# Patient Record
Sex: Female | Born: 1970
Health system: Southern US, Community
[De-identification: ages and names within clinical notes are randomized; demographics above are authoritative.]

## PROBLEM LIST (undated history)

## (undated) DIAGNOSIS — E669 Obesity, unspecified: Secondary | ICD-10-CM

## (undated) DIAGNOSIS — H6690 Otitis media, unspecified, unspecified ear: Secondary | ICD-10-CM

## (undated) DIAGNOSIS — M1990 Unspecified osteoarthritis, unspecified site: Secondary | ICD-10-CM

## (undated) DIAGNOSIS — K859 Acute pancreatitis without necrosis or infection, unspecified: Secondary | ICD-10-CM

## (undated) DIAGNOSIS — K861 Other chronic pancreatitis: Secondary | ICD-10-CM

## (undated) DIAGNOSIS — E119 Type 2 diabetes mellitus without complications: Secondary | ICD-10-CM

## (undated) DIAGNOSIS — G510 Bell's palsy: Secondary | ICD-10-CM

## (undated) DIAGNOSIS — G5600 Carpal tunnel syndrome, unspecified upper limb: Secondary | ICD-10-CM

## (undated) DIAGNOSIS — K649 Unspecified hemorrhoids: Secondary | ICD-10-CM

## (undated) DIAGNOSIS — I10 Essential (primary) hypertension: Secondary | ICD-10-CM

## (undated) DIAGNOSIS — E785 Hyperlipidemia, unspecified: Secondary | ICD-10-CM

## (undated) HISTORY — DX: Otitis media, unspecified, unspecified ear: H66.90

## (undated) HISTORY — PX: CARDIAC CATHETERIZATION: SHX172

## (undated) HISTORY — DX: Type 2 diabetes mellitus without complications: E11.9

## (undated) HISTORY — DX: Essential (primary) hypertension: I10

## (undated) HISTORY — DX: Acute pancreatitis without necrosis or infection, unspecified: K85.90

## (undated) HISTORY — DX: Other chronic pancreatitis: K86.1

## (undated) HISTORY — PX: KNEE ARTHROSCOPY: SUR90

## (undated) HISTORY — DX: Obesity, unspecified: E66.9

## (undated) HISTORY — DX: Unspecified hemorrhoids: K64.9

## (undated) HISTORY — PX: WISDOM TOOTH EXTRACTION: SHX21

## (undated) HISTORY — PX: UMBILICAL HERNIA REPAIR: SHX196

## (undated) HISTORY — DX: Hyperlipidemia, unspecified: E78.5

---

## 1998-12-30 ENCOUNTER — Ambulatory Visit (HOSPITAL_BASED_OUTPATIENT_CLINIC_OR_DEPARTMENT_OTHER): Admission: RE | Admit: 1998-12-30 | Discharge: 1998-12-30 | Payer: Self-pay | Admitting: General Surgery

## 1999-01-20 ENCOUNTER — Ambulatory Visit (HOSPITAL_BASED_OUTPATIENT_CLINIC_OR_DEPARTMENT_OTHER): Admission: RE | Admit: 1999-01-20 | Discharge: 1999-01-20 | Payer: Self-pay | Admitting: General Surgery

## 1999-09-27 ENCOUNTER — Other Ambulatory Visit: Admission: RE | Admit: 1999-09-27 | Discharge: 1999-09-27 | Payer: Self-pay | Admitting: Obstetrics and Gynecology

## 2000-11-08 ENCOUNTER — Other Ambulatory Visit: Admission: RE | Admit: 2000-11-08 | Discharge: 2000-11-08 | Payer: Self-pay | Admitting: Obstetrics and Gynecology

## 2012-04-13 ENCOUNTER — Emergency Department (HOSPITAL_BASED_OUTPATIENT_CLINIC_OR_DEPARTMENT_OTHER)
Admission: EM | Admit: 2012-04-13 | Discharge: 2012-04-14 | Disposition: A | Discharge status: Home / Self Care | Payer: BC Managed Care – PPO | Attending: Emergency Medicine | Admitting: Emergency Medicine

## 2012-04-13 ENCOUNTER — Encounter (HOSPITAL_BASED_OUTPATIENT_CLINIC_OR_DEPARTMENT_OTHER): Payer: Self-pay | Admitting: *Deleted

## 2012-04-13 DIAGNOSIS — F17200 Nicotine dependence, unspecified, uncomplicated: Secondary | ICD-10-CM | POA: Insufficient documentation

## 2012-04-13 DIAGNOSIS — R1013 Epigastric pain: Secondary | ICD-10-CM | POA: Insufficient documentation

## 2012-04-13 DIAGNOSIS — K859 Acute pancreatitis without necrosis or infection, unspecified: Secondary | ICD-10-CM

## 2012-04-13 LAB — URINE MICROSCOPIC-ADD ON

## 2012-04-13 LAB — URINALYSIS, ROUTINE W REFLEX MICROSCOPIC
Glucose, UA: NEGATIVE mg/dL
Specific Gravity, Urine: 1.024 (ref 1.005–1.030)
pH: 5.5 (ref 5.0–8.0)

## 2012-04-13 LAB — LIPASE, BLOOD: Lipase: 129 U/L — ABNORMAL HIGH (ref 11–59)

## 2012-04-13 MED ORDER — SUCRALFATE 1 G PO TABS
1.0000 g | ORAL_TABLET | Freq: Once | ORAL | Status: AC
  Administered 2012-04-13: 1 g via ORAL
  Filled 2012-04-13: qty 1

## 2012-04-13 MED ORDER — ALUM & MAG HYDROXIDE-SIMETH 200-200-20 MG/5ML PO SUSP
ORAL | Status: AC
  Filled 2012-04-13: qty 30

## 2012-04-13 MED ORDER — TRAMADOL HCL 50 MG PO TABS
50.0000 mg | ORAL_TABLET | Freq: Once | ORAL | Status: AC
  Administered 2012-04-13: 50 mg via ORAL
  Filled 2012-04-13: qty 1

## 2012-04-13 NOTE — ED Provider Notes (Signed)
History  This chart was scribed for Kabao Leite Smitty Cords, MD by [name]. This patient was seen in room MH05/MH05 and the patient's care was started at 2147.   CSN: [id number]  Arrival date & time 04/13/12  2147   First MD Initiated Contact with Patient 04/13/12 2248      Chief Complaint  Patient presents with  . Abdominal Pain   Patient is a 41 y.o. female presenting with abdominal pain. The history is provided by the patient. No language interpreter was used.  Abdominal Pain The primary symptoms of the illness include abdominal pain and nausea. The primary symptoms of the illness do not include fever, shortness of breath, vomiting, diarrhea or dysuria. The current episode started 6 to 12 hours ago. The onset of the illness was gradual. The problem has been gradually worsening.  The abdominal pain began 6 to 12 hours ago. The pain came on gradually. The abdominal pain has been gradually worsening since its onset. The abdominal pain is located in the epigastric region. The abdominal pain does not radiate. Pain scale: severe. The abdominal pain is relieved by nothing. The abdominal pain is exacerbated by eating.  The patient has not had a change in bowel habit. Symptoms associated with the illness do not include chills or constipation. Significant associated medical issues do not include GERD.   Teresa Parks is a 41 y.o. female who presents to the Emergency Department complaining of constant gradually worsening epigastric abdominal pain which began today and worsened sometime tonight after eating dinner. She had a normal BM yesterday. She denies nausea, emesis, chills, fever, urinary symptoms. She is a smoker and drinks no alcohol.  History reviewed. No pertinent past medical history.  Past Surgical History  Procedure Date  . Hernia repair   . Knee surgery     No family history on file.  History  Substance Use Topics  . Smoking status: Current Every Day Smoker  . Smokeless  tobacco: Not on file  . Alcohol Use: No    OB History    Grav Para Term Preterm Abortions TAB SAB Ect Mult Living                  Review of Systems  Constitutional: Negative for fever and chills.  HENT: Negative for congestion.   Respiratory: Negative for shortness of breath.   Cardiovascular: Negative for chest pain.  Gastrointestinal: Positive for nausea and abdominal pain. Negative for vomiting, diarrhea and constipation.  Genitourinary: Negative for dysuria.  Neurological: Negative for weakness.  All other systems reviewed and are negative.    Allergies  Novocain  Home Medications  No current outpatient prescriptions on file.  LMP 03/12/2012  Physical Exam  Nursing note and vitals reviewed. Constitutional: She is oriented to person, place, and time. She appears well-developed and well-nourished. No distress.  HENT:  Head: Normocephalic and atraumatic.  Mouth/Throat: Oropharynx is clear and moist.  Eyes: EOM are normal. Pupils are equal, round, and reactive to light.  Neck: Normal range of motion. Neck supple. No tracheal deviation present.  Cardiovascular: Normal rate, regular rhythm, normal heart sounds and intact distal pulses.   No murmur heard. Pulmonary/Chest: Effort normal and breath sounds normal. No respiratory distress. She has no wheezes. She has no rales.  Abdominal: Soft. Bowel sounds are normal. She exhibits no distension. There is tenderness (mild epigastric tenderness). There is no rebound and no guarding.  Musculoskeletal: Normal range of motion.  Neurological: She is alert and oriented to  person, place, and time.  Skin: Skin is warm and dry.  Psychiatric: She has a normal mood and affect. Her behavior is normal.    ED Course  Procedures (including critical care time) DIAGNOSTIC STUDIES:  COORDINATION OF CARE: At 1100 PM Discussed treatment plan with patient which includes tramadol, blood work, and UA. Patient agrees.    Labs Reviewed    URINALYSIS, ROUTINE W REFLEX MICROSCOPIC   No results found.   No diagnosis found.    MDM  Pancreatitis. Tolerating PO well.   Will set up for outpatient ultrasound to exclude biliary disease.  Patient instructed to eat clear liquid diet x 1 week.  Follow up with your PMD for recheck of labs on Friday.  Return for fevers, worsening pain or intractable vomiting or any concerns.  Patient verbalizes understanding and agrees to follow up   I personally performed the services described in this documentation, which was scribed in my presence. The recorded information has been reviewed and considered.        Teresa Awe, MD 04/14/12 [phone]

## 2012-04-13 NOTE — ED Notes (Signed)
C/o abd pain that started today. Was hurting prior to eating out and states know worse after eating. Describes pain as a sharp pain that is constant. Denies n/v. C/o chills denies fevers. Denies any loose stools. Pain is located upper mid abdomen. States abd feels firm to her.

## 2012-04-14 ENCOUNTER — Ambulatory Visit (HOSPITAL_BASED_OUTPATIENT_CLINIC_OR_DEPARTMENT_OTHER)
Admission: RE | Admit: 2012-04-14 | Discharge: 2012-04-14 | Disposition: A | Discharge status: Home / Self Care | Payer: BC Managed Care – PPO | Source: Ambulatory Visit | Attending: Emergency Medicine | Admitting: Emergency Medicine

## 2012-04-14 DIAGNOSIS — K859 Acute pancreatitis without necrosis or infection, unspecified: Secondary | ICD-10-CM | POA: Insufficient documentation

## 2012-04-14 DIAGNOSIS — R1013 Epigastric pain: Secondary | ICD-10-CM | POA: Insufficient documentation

## 2012-04-14 LAB — COMPREHENSIVE METABOLIC PANEL
ALT: 10 U/L (ref 0–35)
BUN: 12 mg/dL (ref 6–23)
Calcium: 8.8 mg/dL (ref 8.4–10.5)
GFR calc Af Amer: >90 mL/min (ref >90)
Glucose, Bld: 219 mg/dL — ABNORMAL HIGH (ref 70–99)
Sodium: 131 mEq/L — ABNORMAL LOW (ref 135–145)
Total Protein: 6.8 g/dL (ref 6.0–8.3)

## 2012-04-14 LAB — CBC WITH DIFFERENTIAL/PLATELET
Basophils Absolute: 0 10*3/uL (ref 0.0–0.1)
Eosinophils Absolute: 0.3 10*3/uL (ref 0.0–0.7)
HCT: 39.4 % (ref 36.0–46.0)
Lymphocytes Relative: 8 % — ABNORMAL LOW (ref 12–46)
MCHC: 36.8 g/dL — ABNORMAL HIGH (ref 30.0–36.0)
Neutro Abs: 11 10*3/uL — ABNORMAL HIGH (ref 1.7–7.7)
RDW: 15.1 % (ref 11.5–15.5)

## 2012-04-14 MED ORDER — ONDANSETRON 8 MG PO TBDP: ORAL_TABLET | ORAL | Status: DC

## 2012-04-14 MED ORDER — OXYCODONE-ACETAMINOPHEN 5-325 MG PO TABS: 1.0000 | ORAL_TABLET | Freq: Four times a day (QID) | ORAL | Status: DC | PRN

## 2012-04-14 NOTE — ED Notes (Signed)
Called pt regarding recent US report per Trisha Mangle, PA.

## 2014-02-05 ENCOUNTER — Encounter (HOSPITAL_BASED_OUTPATIENT_CLINIC_OR_DEPARTMENT_OTHER): Payer: Self-pay | Admitting: Emergency Medicine

## 2014-02-05 ENCOUNTER — Inpatient Hospital Stay (HOSPITAL_BASED_OUTPATIENT_CLINIC_OR_DEPARTMENT_OTHER)
Admission: EM | Admit: 2014-02-05 | Discharge: 2014-02-08 | DRG: 439 | Disposition: A | Discharge status: Home / Self Care | Payer: BC Managed Care – PPO | Attending: Internal Medicine | Admitting: Internal Medicine

## 2014-02-05 ENCOUNTER — Emergency Department (HOSPITAL_BASED_OUTPATIENT_CLINIC_OR_DEPARTMENT_OTHER): Payer: BC Managed Care – PPO

## 2014-02-05 DIAGNOSIS — Z888 Allergy status to other drugs, medicaments and biological substances status: Secondary | ICD-10-CM

## 2014-02-05 DIAGNOSIS — M1710 Unilateral primary osteoarthritis, unspecified knee: Secondary | ICD-10-CM | POA: Diagnosis present

## 2014-02-05 DIAGNOSIS — K859 Acute pancreatitis without necrosis or infection, unspecified: Principal | ICD-10-CM | POA: Diagnosis present

## 2014-02-05 DIAGNOSIS — E669 Obesity, unspecified: Secondary | ICD-10-CM | POA: Diagnosis present

## 2014-02-05 DIAGNOSIS — E1165 Type 2 diabetes mellitus with hyperglycemia: Secondary | ICD-10-CM

## 2014-02-05 DIAGNOSIS — D72829 Elevated white blood cell count, unspecified: Secondary | ICD-10-CM

## 2014-02-05 DIAGNOSIS — Z794 Long term (current) use of insulin: Secondary | ICD-10-CM

## 2014-02-05 DIAGNOSIS — R112 Nausea with vomiting, unspecified: Secondary | ICD-10-CM | POA: Diagnosis present

## 2014-02-05 DIAGNOSIS — E871 Hypo-osmolality and hyponatremia: Secondary | ICD-10-CM | POA: Diagnosis present

## 2014-02-05 DIAGNOSIS — E781 Pure hyperglyceridemia: Secondary | ICD-10-CM | POA: Diagnosis present

## 2014-02-05 DIAGNOSIS — Z8249 Family history of ischemic heart disease and other diseases of the circulatory system: Secondary | ICD-10-CM

## 2014-02-05 DIAGNOSIS — M179 Osteoarthritis of knee, unspecified: Secondary | ICD-10-CM | POA: Diagnosis present

## 2014-02-05 DIAGNOSIS — E7800 Pure hypercholesterolemia, unspecified: Secondary | ICD-10-CM | POA: Diagnosis present

## 2014-02-05 DIAGNOSIS — Z833 Family history of diabetes mellitus: Secondary | ICD-10-CM

## 2014-02-05 DIAGNOSIS — Z6835 Body mass index (BMI) 35.0-35.9, adult: Secondary | ICD-10-CM

## 2014-02-05 DIAGNOSIS — M1730 Unilateral post-traumatic osteoarthritis, unspecified knee: Secondary | ICD-10-CM

## 2014-02-05 DIAGNOSIS — IMO0001 Reserved for inherently not codable concepts without codable children: Secondary | ICD-10-CM | POA: Diagnosis present

## 2014-02-05 DIAGNOSIS — F17200 Nicotine dependence, unspecified, uncomplicated: Secondary | ICD-10-CM | POA: Diagnosis present

## 2014-02-05 DIAGNOSIS — E860 Dehydration: Secondary | ICD-10-CM

## 2014-02-05 DIAGNOSIS — Z6379 Other stressful life events affecting family and household: Secondary | ICD-10-CM

## 2014-02-05 DIAGNOSIS — Z720 Tobacco use: Secondary | ICD-10-CM

## 2014-02-05 LAB — HEPATIC FUNCTION PANEL
ALT: 18 U/L (ref 0–35)
AST: 30 U/L (ref 0–37)
Albumin: 3.3 g/dL — ABNORMAL LOW (ref 3.5–5.2)
Alkaline Phosphatase: 116 U/L (ref 39–117)
Total Bilirubin: 0.4 mg/dL (ref 0.3–1.2)
Total Protein: 7.6 g/dL (ref 6.0–8.3)

## 2014-02-05 LAB — CBC WITH DIFFERENTIAL/PLATELET
BASOS ABS: 0 10*3/uL (ref 0.0–0.1)
BASOS PCT: 0 % (ref 0–1)
EOS ABS: 0 10*3/uL (ref 0.0–0.7)
EOS PCT: 0 % (ref 0–5)
HEMATOCRIT: 42.5 % (ref 36.0–46.0)
Hemoglobin: 15.4 g/dL — ABNORMAL HIGH (ref 12.0–15.0)
Lymphocytes Relative: 6 % — ABNORMAL LOW (ref 12–46)
Lymphs Abs: 1 10*3/uL (ref 0.7–4.0)
MCH: 30.6 pg (ref 26.0–34.0)
MCHC: 36.2 g/dL — AB (ref 30.0–36.0)
MCV: 84.3 fL (ref 78.0–100.0)
MONO ABS: 1 10*3/uL (ref 0.1–1.0)
Monocytes Relative: 6 % (ref 3–12)
Neutro Abs: 15.1 10*3/uL — ABNORMAL HIGH (ref 1.7–7.7)
Neutrophils Relative %: 88 % — ABNORMAL HIGH (ref 43–77)
PLATELETS: 356 10*3/uL (ref 150–400)
RBC: 5.04 MIL/uL (ref 3.87–5.11)
RDW: 16.2 % — AB (ref 11.5–15.5)
WBC: 17.2 10*3/uL — ABNORMAL HIGH (ref 4.0–10.5)

## 2014-02-05 LAB — URINALYSIS, ROUTINE W REFLEX MICROSCOPIC
Glucose, UA: 250 mg/dL — AB
Ketones, ur: 40 mg/dL — AB
Leukocytes, UA: NEGATIVE
NITRITE: NEGATIVE
Specific Gravity, Urine: 1.034 — ABNORMAL HIGH (ref 1.005–1.030)
UROBILINOGEN UA: 1 mg/dL (ref 0.0–1.0)
pH: 5.5 (ref 5.0–8.0)

## 2014-02-05 LAB — BASIC METABOLIC PANEL
ANION GAP: 23 — AB (ref 5–15)
BUN: 11 mg/dL (ref 6–23)
CALCIUM: 8.9 mg/dL (ref 8.4–10.5)
CO2: 18 meq/L — AB (ref 19–32)
CREATININE: 0.47 mg/dL — AB (ref 0.50–1.10)
Chloride: 89 mEq/L — ABNORMAL LOW (ref 96–112)
Glucose, Bld: 267 mg/dL — ABNORMAL HIGH (ref 70–99)
Potassium: 4.7 mEq/L (ref 3.7–5.3)
SODIUM: 130 meq/L — AB (ref 137–147)

## 2014-02-05 LAB — PREGNANCY, URINE: PREG TEST UR: NEGATIVE

## 2014-02-05 LAB — URINE MICROSCOPIC-ADD ON

## 2014-02-05 LAB — LIPASE, BLOOD: LIPASE: 680 U/L — AB (ref 11–59)

## 2014-02-05 MED ORDER — BIOTENE DRY MOUTH MT LIQD: 15.0000 mL | Freq: Two times a day (BID) | OROMUCOSAL | Status: DC

## 2014-02-05 MED ORDER — ONDANSETRON HCL 4 MG/2ML IJ SOLN
4.0000 mg | Freq: Once | INTRAMUSCULAR | Status: AC
  Administered 2014-02-05: 4 mg via INTRAVENOUS
  Filled 2014-02-05: qty 2

## 2014-02-05 MED ORDER — METOCLOPRAMIDE HCL 5 MG/ML IJ SOLN
10.0000 mg | Freq: Once | INTRAMUSCULAR | Status: AC
  Administered 2014-02-05: 10 mg via INTRAVENOUS
  Filled 2014-02-05: qty 2

## 2014-02-05 MED ORDER — CHLORHEXIDINE GLUCONATE 0.12 % MT SOLN: 15.0000 mL | Freq: Two times a day (BID) | OROMUCOSAL | Status: DC

## 2014-02-05 MED ORDER — SODIUM CHLORIDE 0.9 % IV BOLUS (SEPSIS)
2000.0000 mL | Freq: Once | INTRAVENOUS | Status: AC
  Administered 2014-02-05: 2000 mL via INTRAVENOUS

## 2014-02-05 MED ORDER — HYDROMORPHONE HCL PF 1 MG/ML IJ SOLN
1.0000 mg | Freq: Once | INTRAMUSCULAR | Status: AC
  Administered 2014-02-05: 1 mg via INTRAVENOUS
  Filled 2014-02-05: qty 1

## 2014-02-05 MED ORDER — MORPHINE SULFATE 4 MG/ML IJ SOLN
4.0000 mg | INTRAMUSCULAR | Status: DC | PRN
  Administered 2014-02-05: 4 mg via INTRAVENOUS
  Filled 2014-02-05: qty 1

## 2014-02-05 NOTE — ED Notes (Signed)
Pt reports generalized abdominal pain that started yesterday - reports nausea/vomiting since 1430 today.

## 2014-02-05 NOTE — ED Notes (Signed)
Receiving nurse unavailable for report-will return call

## 2014-02-05 NOTE — ED Notes (Signed)
MD at bedside. 

## 2014-02-05 NOTE — Plan of Care (Signed)
43 yo F presenting to ED with acute pancreatitis, WBC 17k, lipase 680, also has elevated BGL in 200s.  Dry with hyponatremia, no kidney function impact yet, admitting to med-surg.  Per EDP: Pancreatitis likely due to high triglycerides (apparently known) which she does not take medications for.  Also patient appearance is cushingoid (not diagnosed with cushings yet) per EDP.

## 2014-02-05 NOTE — ED Provider Notes (Signed)
CSN: [id number]     Arrival date & time 02/05/14  1641 History   First MD Initiated Contact with Patient 02/05/14 1650     Chief Complaint  Patient presents with  . Abdominal Pain     (Consider location/radiation/quality/duration/timing/severity/associated sxs/prior Treatment) HPI  Teresa Parks is a 43 y.o. female complaining of severe epigastric abdominal pain onset yesterday with multiple episodes of nonbloody, nonbilious, no coffee-ground emesis associated with subjective fever and chills. Patient states that she has prior episode of pancreatitis in this this feels the same. She's been taking Motrin home with little relief. She's had no bowel movements but reports no change in urination, denies chest pain, shortness of breath. Denies history of alcohol abuse or hyperlipidemia. No prior abdominal surgeries. States her blood sugars run around 200s. States she has never been told that she is diabetic and states she is not taking any medication for this. Patient states that she is trying to lose weight and is seen at [facility] urgent care center.  History reviewed. No pertinent past medical history. Past Surgical History  Procedure Laterality Date  . Hernia repair    . Knee surgery     History reviewed. No pertinent family history. History  Substance Use Topics  . Smoking status: Current Every Day Smoker  . Smokeless tobacco: Not on file  . Alcohol Use: No   OB History   Grav Para Term Preterm Abortions TAB SAB Ect Mult Living                 Review of Systems  10 systems reviewed and found to be negative, except as noted in the HPI.   Allergies  Novocain  Home Medications   Prior to Admission medications   Medication Sig Start Date End Date Taking? Authorizing Provider  ondansetron (ZOFRAN ODT) 8 MG disintegrating tablet 8mg  ODT q4 hours prn nausea 04/14/12   April K Palumbo-Rasch, MD  oxyCODONE-acetaminophen (PERCOCET) 5-325 MG per tablet Take 1 tablet by mouth every 6  (six) hours as needed for pain. 04/14/12   April K Palumbo-Rasch, MD   BP 137/81  Pulse 79  Temp(Src) 99.3 F (37.4 C) (Oral)  Resp 20  Ht 5\' 6"  (1.676 m)  Wt 220 lb (99.791 kg)  BMI 35.53 kg/m2  SpO2 92%  LMP 01/25/2014 Physical Exam  Nursing note and vitals reviewed. Constitutional: She is oriented to person, place, and time. She appears well-developed and well-nourished. No distress.  Cushingoid with small limbs and significant central adiposity  HENT:  Head: Normocephalic.  Eyes: Conjunctivae and EOM are normal.  Cardiovascular: Normal rate.   Pulmonary/Chest: Effort normal. No stridor. No respiratory distress. She has no wheezes. She has no rales. She exhibits no tenderness.  Abdominal: Soft. Bowel sounds are normal. She exhibits no distension and no mass. There is tenderness. There is no rebound and no guarding.  Positive Murphy's sign, also tender to palpation in the epigastrium. No guarding or rebound.  Musculoskeletal: Normal range of motion.  Neurological: She is alert and oriented to person, place, and time.  Psychiatric: She has a normal mood and affect.    ED Course  Procedures (including critical care time) Labs Review Labs Reviewed  CBC WITH DIFFERENTIAL - Abnormal; Notable for the following:    WBC 17.2 (*)    Hemoglobin 15.4 (*)    MCHC 36.2 (*)    RDW 16.2 (*)    Neutrophils Relative % 88 (*)    Neutro Abs 15.1 (*)  Lymphocytes Relative 6 (*)    All other components within normal limits  BASIC METABOLIC PANEL - Abnormal; Notable for the following:    Sodium 130 (*)    Chloride 89 (*)    CO2 18 (*)    Glucose, Bld 267 (*)    Creatinine, Ser 0.47 (*)    Anion gap 23 (*)    All other components within normal limits  HEPATIC FUNCTION PANEL - Abnormal; Notable for the following:    Albumin 3.3 (*)    All other components within normal limits  LIPASE, BLOOD - Abnormal; Notable for the following:    Lipase 680 (*)    All other components within normal  limits  URINALYSIS, ROUTINE W REFLEX MICROSCOPIC - Abnormal; Notable for the following:    Color, Urine AMBER (*)    APPearance TURBID (*)    Specific Gravity, Urine 1.034 (*)    Glucose, UA 250 (*)    Hgb urine dipstick SMALL (*)    Bilirubin Urine SMALL (*)    Ketones, ur 40 (*)    Protein, ur >300 (*)    All other components within normal limits  URINE MICROSCOPIC-ADD ON - Abnormal; Notable for the following:    Squamous Epithelial / LPF FEW (*)    Bacteria, UA MANY (*)    All other components within normal limits  PREGNANCY, URINE    Imaging Review US Abdomen Complete  02/05/2014   CLINICAL DATA:  Epigastric pain and nausea and vomiting for 1 day, chills  EXAM: ULTRASOUND ABDOMEN COMPLETE  COMPARISON:  None.  FINDINGS: Gallbladder:  There may be a very small volume of sludge although this is equivocal. No gallstones or gallbladder wall thickening. No Murphy's sign.  Common bile duct:  Diameter: Approximately 4 mm although evaluation is markedly limited.  Liver:  Liver is not well evaluated. The liver appears to be sonographically dense. Cannot exclude hepatic abnormalities.  IVC:  Not identified  Pancreas:  No gross abnormalities but seen in very limited detail  Spleen:  Size and appearance within normal limits.  Right Kidney:  Length: 14 cm. Echogenicity within normal limits. No mass or hydronephrosis visualized.  Left Kidney:  Length: 13 cm. Echogenicity within normal limits. No mass or hydronephrosis visualized.  Abdominal aorta:  Cannot be evaluated due to body habitus and overlying bowel gas  Other findings:  None.  IMPRESSION: No acute findings, but sensitivity markedly reduced by the limitations described above.   Electronically Signed   By: Skipper Cliche M.D.   On: 02/05/2014 18:02     EKG Interpretation None      MDM   Final diagnoses:  Acute pancreatitis, unspecified pancreatitis type    Filed Vitals:   02/05/14 1645 02/05/14 1651 02/05/14 1846 02/05/14 2022  BP:  155/100  140/66 137/81  Pulse: 95  82 79  Temp: 98.2 F (36.8 C)  99.4 F (37.4 C) 99.3 F (37.4 C)  TempSrc: Oral Oral Oral Oral  Resp: 21  20 20   Height:  5\' 6"  (1.676 m)    Weight:  220 lb (99.791 kg)    SpO2: 100%  96% 92%    Medications  morphine 4 MG/ML injection 4 mg (4 mg Intravenous Given 02/05/14 1714)  ondansetron (ZOFRAN) injection 4 mg (4 mg Intravenous Given 02/05/14 1714)  sodium chloride 0.9 % bolus 2,000 mL (0 mLs Intravenous Stopped 02/05/14 2202)  HYDROmorphone (DILAUDID) injection 1 mg (1 mg Intravenous Given 02/05/14 1827)  HYDROmorphone (DILAUDID) injection 1  mg (1 mg Intravenous Given 02/05/14 2202)  metoCLOPramide (REGLAN) injection 10 mg (10 mg Intravenous Given 02/05/14 2211)    Teresa Parks is a 43 y.o. female presenting with severe epigastric abdominal pain and nausea or vomiting. States that feels like prior episode of pancreatitis.  Blood work with significant lipid content, cannot be analyzed at [facility] must be sent out to [facility]  On exam, patient's husband discloses that she has high triglycerides and does not take medication for it. Pain is significantly improved after Dilaudid 2/10.  Patient with significant leukocytosis of 17.2. Chemistries results showing low bicarbonate anion gap of 23. Lipase is elevated at 680. Likely secondary to high triglycerides. Patient has cushingoid appearance.   Patient will be transferred to [facility] for admission for pancreatitis. Case discussed with [facility] hospitalist Dr. Alcario Drought who accepts transfer.   Monico Blitz, PA-C 02/05/14 2215

## 2014-02-05 NOTE — ED Provider Notes (Signed)
Medical screening examination/treatment/procedure(s) were performed by non-physician practitioner and as supervising physician I was immediately available for consultation/collaboration.   EKG Interpretation None        Malvin Johns, MD 02/05/14 2322

## 2014-02-06 ENCOUNTER — Encounter (HOSPITAL_COMMUNITY): Payer: Self-pay | Admitting: Internal Medicine

## 2014-02-06 DIAGNOSIS — K859 Acute pancreatitis without necrosis or infection, unspecified: Principal | ICD-10-CM

## 2014-02-06 DIAGNOSIS — M175 Other unilateral secondary osteoarthritis of knee: Secondary | ICD-10-CM

## 2014-02-06 DIAGNOSIS — F17200 Nicotine dependence, unspecified, uncomplicated: Secondary | ICD-10-CM

## 2014-02-06 DIAGNOSIS — M179 Osteoarthritis of knee, unspecified: Secondary | ICD-10-CM | POA: Diagnosis present

## 2014-02-06 DIAGNOSIS — E7800 Pure hypercholesterolemia, unspecified: Secondary | ICD-10-CM

## 2014-02-06 DIAGNOSIS — E860 Dehydration: Secondary | ICD-10-CM | POA: Diagnosis present

## 2014-02-06 DIAGNOSIS — E1165 Type 2 diabetes mellitus with hyperglycemia: Secondary | ICD-10-CM

## 2014-02-06 DIAGNOSIS — Z720 Tobacco use: Secondary | ICD-10-CM | POA: Diagnosis present

## 2014-02-06 DIAGNOSIS — D72829 Elevated white blood cell count, unspecified: Secondary | ICD-10-CM | POA: Diagnosis present

## 2014-02-06 DIAGNOSIS — IMO0001 Reserved for inherently not codable concepts without codable children: Secondary | ICD-10-CM | POA: Diagnosis present

## 2014-02-06 DIAGNOSIS — M1710 Unilateral primary osteoarthritis, unspecified knee: Secondary | ICD-10-CM | POA: Diagnosis present

## 2014-02-06 DIAGNOSIS — E781 Pure hyperglyceridemia: Secondary | ICD-10-CM

## 2014-02-06 DIAGNOSIS — R112 Nausea with vomiting, unspecified: Secondary | ICD-10-CM | POA: Diagnosis present

## 2014-02-06 LAB — ETHANOL

## 2014-02-06 LAB — LIPID PANEL
CHOLESTEROL: 520 mg/dL — AB (ref 0–200)
LDL Cholesterol: UNDETERMINED mg/dL (ref 0–99)
TRIGLYCERIDES: 2826 mg/dL — AB (ref <150)
VLDL: UNDETERMINED mg/dL (ref 0–40)

## 2014-02-06 LAB — BASIC METABOLIC PANEL
Anion gap: 15 (ref 5–15)
BUN: 10 mg/dL (ref 6–23)
CALCIUM: 8.4 mg/dL (ref 8.4–10.5)
CO2: 23 meq/L (ref 19–32)
CREATININE: 0.56 mg/dL (ref 0.50–1.10)
Chloride: 96 mEq/L (ref 96–112)
GFR calc Af Amer: >90 mL/min (ref >90)
GFR calc non Af Amer: >90 mL/min (ref >90)
GLUCOSE: 274 mg/dL — AB (ref 70–99)
Potassium: 4.3 mEq/L (ref 3.7–5.3)
Sodium: 134 mEq/L — ABNORMAL LOW (ref 137–147)

## 2014-02-06 LAB — GLUCOSE, CAPILLARY
GLUCOSE-CAPILLARY: 222 mg/dL — AB (ref 70–99)
GLUCOSE-CAPILLARY: 219 mg/dL — AB (ref 70–99)
Glucose-Capillary: 271 mg/dL — ABNORMAL HIGH (ref 70–99)
Glucose-Capillary: 233 mg/dL — ABNORMAL HIGH (ref 70–99)

## 2014-02-06 LAB — CBC
HEMATOCRIT: 39.2 % (ref 36.0–46.0)
HEMOGLOBIN: 13.4 g/dL (ref 12.0–15.0)
MCH: 29.4 pg (ref 26.0–34.0)
MCHC: 34.2 g/dL (ref 30.0–36.0)
MCV: 86 fL (ref 78.0–100.0)
Platelets: 311 10*3/uL (ref 150–400)
RBC: 4.56 MIL/uL (ref 3.87–5.11)
RDW: 16.1 % — AB (ref 11.5–15.5)
WBC: 14.6 10*3/uL — ABNORMAL HIGH (ref 4.0–10.5)

## 2014-02-06 LAB — LIPASE, BLOOD: Lipase: 299 U/L — ABNORMAL HIGH (ref 11–59)

## 2014-02-06 LAB — TSH: TSH: 3.46 u[IU]/mL (ref 0.350–4.500)

## 2014-02-06 LAB — RAPID URINE DRUG SCREEN, HOSP PERFORMED
AMPHETAMINES: NOT DETECTED
Barbiturates: NOT DETECTED
Benzodiazepines: NOT DETECTED
Cocaine: NOT DETECTED
OPIATES: POSITIVE — AB
Tetrahydrocannabinol: NOT DETECTED

## 2014-02-06 LAB — HEMOGLOBIN A1C
Hgb A1c MFr Bld: 9.4 % — ABNORMAL HIGH (ref <5.7)
Mean Plasma Glucose: 223 mg/dL — ABNORMAL HIGH (ref <117)

## 2014-02-06 MED ORDER — ACETAMINOPHEN 650 MG RE SUPP: 650.0000 mg | Freq: Four times a day (QID) | RECTAL | Status: DC | PRN

## 2014-02-06 MED ORDER — HEPARIN SODIUM (PORCINE) 5000 UNIT/ML IJ SOLN
5000.0000 [IU] | Freq: Three times a day (TID) | INTRAMUSCULAR | Status: DC
  Administered 2014-02-06 – 2014-02-08 (×6): 5000 [IU] via SUBCUTANEOUS
  Filled 2014-02-06 (×11): qty 1

## 2014-02-06 MED ORDER — INSULIN DETEMIR 100 UNIT/ML ~~LOC~~ SOLN
7.0000 [IU] | Freq: Every day | SUBCUTANEOUS | Status: DC
  Administered 2014-02-06 – 2014-02-07 (×3): 7 [IU] via SUBCUTANEOUS
  Filled 2014-02-06 (×4): qty 0.07

## 2014-02-06 MED ORDER — ONDANSETRON HCL 4 MG/2ML IJ SOLN: 4.0000 mg | Freq: Four times a day (QID) | INTRAMUSCULAR | Status: DC | PRN

## 2014-02-06 MED ORDER — ACETAMINOPHEN 325 MG PO TABS: 650.0000 mg | ORAL_TABLET | Freq: Four times a day (QID) | ORAL | Status: DC | PRN

## 2014-02-06 MED ORDER — INSULIN ASPART 100 UNIT/ML ~~LOC~~ SOLN
0.0000 [IU] | Freq: Three times a day (TID) | SUBCUTANEOUS | Status: DC
  Administered 2014-02-06 (×2): 3 [IU] via SUBCUTANEOUS
  Administered 2014-02-06: 5 [IU] via SUBCUTANEOUS
  Administered 2014-02-07 – 2014-02-08 (×5): 2 [IU] via SUBCUTANEOUS

## 2014-02-06 MED ORDER — SODIUM CHLORIDE 0.9 % IV SOLN
INTRAVENOUS | Status: DC
Start: 2014-02-06 — End: 2014-02-08
  Administered 2014-02-06 (×2): via INTRAVENOUS
  Administered 2014-02-06: 1000 mL via INTRAVENOUS
  Administered 2014-02-07 – 2014-02-08 (×4): via INTRAVENOUS

## 2014-02-06 MED ORDER — HYDROMORPHONE HCL PF 1 MG/ML IJ SOLN
1.0000 mg | INTRAMUSCULAR | Status: DC | PRN
  Administered 2014-02-06 – 2014-02-07 (×4): 1 mg via INTRAVENOUS
  Filled 2014-02-06 (×4): qty 1

## 2014-02-06 MED ORDER — ONDANSETRON HCL 4 MG PO TABS: 4.0000 mg | ORAL_TABLET | Freq: Four times a day (QID) | ORAL | Status: DC | PRN

## 2014-02-06 NOTE — H&P (Addendum)
[facility] History and Physical  Teresa Parks VHQ:[id number] DOB: [birth year] DOA: 02/05/2014  Referring physician: Dr. Tamera Punt PCP: No primary provider on file.   Chief Complaint: abd pain, nausea, vomiting  HPI: Teresa Parks is a 43 y.o. female with pmh significant for Tobacco abuse, obesity, OA and hx of hypertriglyceridemia. Came to ED complaining of abd pain, nausea, vomiting and difficulty keep things down. Patient was found with pancreatitis (lipase 680) and also dehydrated (specific gravity 1.034);  Patient denies, fever, CP, hematemesis, melena, hematochezia, orthopnea or any other complaints.  History reviewed. No pertinent past medical history.  Past Surgical History  Procedure Laterality Date  . Hernia repair    . Knee surgery     Social History:  reports that she has been smoking.  She does not have any smokeless tobacco history on file. She reports that she does not drink alcohol or use illicit drugs.  Allergies  Allergen Reactions  . Novocain [Procaine]     [place]: with hx of tobacco; HTN and DM   Prior to Admission medications   Medication Sig Start Date End Date Taking? Authorizing Provider  ondansetron (ZOFRAN ODT) 8 MG disintegrating tablet 8mg  ODT q4 hours prn nausea 04/14/12   April K Palumbo-Rasch, MD  oxyCODONE-acetaminophen (PERCOCET) 5-325 MG per tablet Take 1 tablet by mouth every 6 (six) hours as needed for pain. 04/14/12   April K Palumbo-Rasch, MD   Physical Exam: Filed Vitals:   02/05/14 1846 02/05/14 2022 02/05/14 2214 02/05/14 2325  BP: 140/66 137/81 137/75 140/82  Pulse: 82 79 83 97  Temp: 99.4 F (37.4 C) 99.3 F (37.4 C)  98.2 F (36.8 C)  TempSrc: Oral Oral  Oral  Resp: 20 20 20 18   Height:    5\' 6"  (1.676 m)  Weight:    100.6 kg (221 lb 12.5 oz)  SpO2: 96% 92% 93% 93%    Wt Readings from Last 3 Encounters:  02/05/14 100.6 kg (221 lb 12.5 oz)    General:  Appears calm and comfortable, denies abd pain currently. No  fever. Eyes: PERRL, normal lids, irises & conjunctiva; no nystagmus or icterus ENT: grossly normal hearing, dry MM, no exudates, no erythema, no drainage out of ears or nostrils Neck: no LAD, masses or thyromegaly; no JVD Cardiovascular: RRR, no m/r/g. No LE edema. Respiratory: CTA bilaterally, no w/r/r. Normal respiratory effort. Abdomen: soft, slight distension on exam (according to patient not new); epigastric tenderness; positive BS Skin: no rash, petechiae or induration seen on limited exam Psychiatric: grossly normal mood and affect, speech fluent and appropriate Neurologic: grossly non-focal.           Labs on Admission:  Basic Metabolic Panel:  Recent Labs Lab 02/05/14 1700  NA 130*  K 4.7  CL 89*  CO2 18*  GLUCOSE 267*  BUN 11  CREATININE 0.47*  CALCIUM 8.9   Liver Function Tests:  Recent Labs Lab 02/05/14 1700  AST 30  ALT 18  ALKPHOS 116  BILITOT 0.4  PROT 7.6  ALBUMIN 3.3*    Recent Labs Lab 02/05/14 1700  LIPASE 680*   CBC:  Recent Labs Lab 02/05/14 1700  WBC 17.2*  NEUTROABS 15.1*  HGB 15.4*  HCT 42.5  MCV 84.3  PLT 356    Radiological Exams on Admission: US Abdomen Complete  02/05/2014   CLINICAL DATA:  Epigastric pain and nausea and vomiting for 1 day, chills  EXAM: ULTRASOUND ABDOMEN COMPLETE  COMPARISON:  None.  FINDINGS: Gallbladder:  There may be a very small volume of sludge although this is equivocal. No gallstones or gallbladder wall thickening. No Murphy's sign.  Common bile duct:  Diameter: Approximately 4 mm although evaluation is markedly limited.  Liver:  Liver is not well evaluated. The liver appears to be sonographically dense. Cannot exclude hepatic abnormalities.  IVC:  Not identified  Pancreas:  No gross abnormalities but seen in very limited detail  Spleen:  Size and appearance within normal limits.  Right Kidney:  Length: 14 cm. Echogenicity within normal limits. No mass or hydronephrosis visualized.  Left Kidney:   Length: 13 cm. Echogenicity within normal limits. No mass or hydronephrosis visualized.  Abdominal aorta:  Cannot be evaluated due to body habitus and overlying bowel gas  Other findings:  None.  IMPRESSION: No acute findings, but sensitivity markedly reduced by the limitations described above.   Electronically Signed   By: Skipper Cliche M.D.   On: 02/05/2014 18:02    EKG:  None   Assessment/Plan 1-Acute pancreatitis: appears to be secondary to triglycerides (has hx of elevated TG in the past); patient denies ETOH or any recreational drug abuse. In the ED ab US demonstrated no cholecystitis, no gallstones and no dilatation of bile ducts. -admit to med surg -IVF resuscitation; will check UDS and ETOH  -NPO (except for ice chips) -PRN analgesics and antiemetics -will check lipid profile  2-Tobacco abuse: cessation counseling provided. -patient decline nicotine patch  3-Dehydration and hyponatremia: due to no PO intake with N/V and abd pain. -will give IVF's resuscitation -follow electrolytes  4-Nausea with vomiting: due to #1 -will use PRN antiemetics and analgesics  5-Leukocytosis: no source of infection appreciated -will follow WBC's trend -no antibiotics will be started at this moment   6-Type II or unspecified type diabetes mellitus without mention of complication, uncontrolled: will check A1C -patient started on levemir and SSI  7-OA (osteoarthritis) of knee: continue PRN pain meds.   Code Status: Full DVT Prophylaxis:Heparin Family Communication: daughter and husband at bedside Disposition Plan: inpatient, med-surg bed; LOS > 2 midnights  Time spent: 85 minutes  Barton Dubois [facility] Pager [phone]  **Disclaimer: This note may have been dictated with voice recognition software. Similar sounding words can inadvertently be transcribed and this note may contain transcription errors which may not have been corrected upon publication of note.**

## 2014-02-06 NOTE — Progress Notes (Addendum)
[facility] PROGRESS NOTE  Teresa Parks ATF:[id number] DOB: 20-May-1971 DOA: 02/05/2014 PCP: No primary provider on file.  Assessment/Plan:  1-Acute pancreatitis: secondary to triglycerides (has hx of elevated TG in the past); patient denies ETOH or any recreational drug abuse. In the ED ab US demonstrated no cholecystitis, no gallstones and no dilatation of bile ducts.  -Triglycerides still markedly elevated 2826 -Continue conservative management-IV fluids, analgesics and antiemetics -Improving-Lipase trending down, Will start on clears and follow 2-Tobacco abuse: cessation counseling provided.  -patient decline nicotine patch  3-Dehydration and hyponatremia: due to no PO intake with N/V and abd pain.  -Improved with hydration, follow 4-Leukocytosis: no source of infection appreciated  -Likely reactive secondary to #1, trending down -no antibiotics will be started at this moment  5-Type II or unspecified type diabetes mellitus without mention of complication, uncontrolled:  A1C 9.4 -Continue levemir and SSI  6-OA (osteoarthritis) of knee: continue PRN pain meds 7. Hypertriglyceridemia/hypercholesterolemia -Follow and start on antilipid meds once able to tolerate by mouth well Code Status: Full code Family Communication: Husband and children at bedside Disposition Plan: To home when medically ready   Consultants:  None  Procedures:  None  Antibiotics:  None  HPI/Subjective: States feeling better-no further nausea or vomiting still some abdominal pain.  Objective: Filed Vitals:   02/06/14 0521  BP: 142/76  Pulse: 99  Temp: 98.8 F (37.1 C)  Resp: 18    Intake/Output Summary (Last 24 hours) at 02/06/14 1140 Last data filed at 02/06/14 0400  Gross per 24 hour  Intake      0 ml  Output    600 ml  Net   -600 ml   Filed Weights   02/05/14 1651 02/05/14 2325  Weight: 99.791 kg (220 lb) 100.6 kg (221 lb 12.5 oz)    Exam:  General: alert & oriented x 3 In  NAD Cardiovascular: RRR, nl S1 s2 Respiratory: CTAB Abdomen: soft +BS UPPER ABDOMINAL tenderness>L, no rebound/ND, no masses palpable  Extremities: No cyanosis and no edema    Data Reviewed: Basic Metabolic Panel:  Recent Labs Lab 02/05/14 1700 02/06/14 0140  NA 130* 134*  K 4.7 4.3  CL 89* 96  CO2 18* 23  GLUCOSE 267* 274*  BUN 11 10  CREATININE 0.47* 0.56  CALCIUM 8.9 8.4   Liver Function Tests:  Recent Labs Lab 02/05/14 1700  AST 30  ALT 18  ALKPHOS 116  BILITOT 0.4  PROT 7.6  ALBUMIN 3.3*    Recent Labs Lab 02/05/14 1700 02/06/14 0140  LIPASE 680* 299*   No results found for this basename: AMMONIA,  in the last 168 hours CBC:  Recent Labs Lab 02/05/14 1700 02/06/14 0140  WBC 17.2* 14.6*  NEUTROABS 15.1*  --   HGB 15.4* 13.4  HCT 42.5 39.2  MCV 84.3 86.0  PLT 356 311   Cardiac Enzymes: No results found for this basename: CKTOTAL, CKMB, CKMBINDEX, TROPONINI,  in the last 168 hours BNP (last 3 results) No results found for this basename: PROBNP,  in the last 8760 hours CBG:  Recent Labs Lab 02/06/14 0822  GLUCAP 271*    No results found for this or any previous visit (from the past 240 hour(s)).   Studies: US Abdomen Complete  02/05/2014   CLINICAL DATA:  Epigastric pain and nausea and vomiting for 1 day, chills  EXAM: ULTRASOUND ABDOMEN COMPLETE  COMPARISON:  None.  FINDINGS: Gallbladder:  There may be a very small volume of sludge although this  is equivocal. No gallstones or gallbladder wall thickening. No Murphy's sign.  Common bile duct:  Diameter: Approximately 4 mm although evaluation is markedly limited.  Liver:  Liver is not well evaluated. The liver appears to be sonographically dense. Cannot exclude hepatic abnormalities.  IVC:  Not identified  Pancreas:  No gross abnormalities but seen in very limited detail  Spleen:  Size and appearance within normal limits.  Right Kidney:  Length: 14 cm. Echogenicity within normal limits. No mass  or hydronephrosis visualized.  Left Kidney:  Length: 13 cm. Echogenicity within normal limits. No mass or hydronephrosis visualized.  Abdominal aorta:  Cannot be evaluated due to body habitus and overlying bowel gas  Other findings:  None.  IMPRESSION: No acute findings, but sensitivity markedly reduced by the limitations described above.   Electronically Signed   By: Skipper Cliche M.D.   On: 02/05/2014 18:02    Scheduled Meds: . heparin  5,000 Units Subcutaneous 3 times per day  . insulin aspart  0-9 Units Subcutaneous TID WC  . insulin detemir  7 Units Subcutaneous QHS   Continuous Infusions: . sodium chloride 125 mL/hr at 02/06/14 7412    Principal Problem:   Acute pancreatitis Active Problems:   Tobacco abuse   Dehydration   Nausea with vomiting   Leukocytosis   Type II or unspecified type diabetes mellitus without mention of complication, uncontrolled   OA (osteoarthritis) of knee    Time spent: [facility] Pager [phone]. If 7PM-7AM, please contact night-coverage at [email or web address], password [facility] 02/06/2014, 11:40 AM  LOS: 1 day

## 2014-02-07 LAB — GLUCOSE, CAPILLARY
GLUCOSE-CAPILLARY: 179 mg/dL — AB (ref 70–99)
GLUCOSE-CAPILLARY: 162 mg/dL — AB (ref 70–99)
Glucose-Capillary: 172 mg/dL — ABNORMAL HIGH (ref 70–99)
Glucose-Capillary: 182 mg/dL — ABNORMAL HIGH (ref 70–99)

## 2014-02-07 LAB — LIPASE, BLOOD: Lipase: 50 U/L (ref 11–59)

## 2014-02-07 MED ORDER — SIMVASTATIN 20 MG PO TABS
20.0000 mg | ORAL_TABLET | Freq: Every day | ORAL | Status: DC
  Administered 2014-02-07: 20 mg via ORAL
  Filled 2014-02-07 (×2): qty 1

## 2014-02-07 MED ORDER — FENOFIBRATE 54 MG PO TABS
54.0000 mg | ORAL_TABLET | Freq: Every day | ORAL | Status: DC
  Administered 2014-02-07 – 2014-02-08 (×2): 54 mg via ORAL
  Filled 2014-02-07 (×2): qty 1

## 2014-02-07 NOTE — Progress Notes (Signed)
[facility] PROGRESS NOTE  Teresa Parks CHE:[id number] DOB: [birth year] DOA: 02/05/2014 PCP: No primary provider on file.  Assessment/Plan:  1-Acute pancreatitis: secondary to triglycerides (has hx of elevated TG in the past); patient denies ETOH or any recreational drug abuse. In the ED ab US demonstrated no cholecystitis, no gallstones and no dilatation of bile ducts.  -Triglycerides still markedly elevated 2826 -Continue conservative management-IV fluids, analgesics and antiemetics -Improving-Lipase trending down, Will start on clears and follow 2-Tobacco abuse: cessation counseling provided.  -patient decline nicotine patch  3-Dehydration and hyponatremia: due to no PO intake with N/V and abd pain.  -Improved with hydration, follow 4-Leukocytosis: no source of infection appreciated  -Likely reactive secondary to #1, trending down -no antibiotics will be started at this moment  5-Type II or unspecified type diabetes mellitus without mention of complication, uncontrolled:  A1C 9.4 -Continue levemir and SSI  6-OA (osteoarthritis) of knee: continue PRN pain meds 7. Hypertriglyceridemia/hypercholesterolemia -will antilipid meds once able to tolerate by mouth well -states she was started on fenofibrate in the past and she filled the prescription but never has taken it b/c she does not like taking pills- educated on the importance of compliance with her family present and she states she will take the meds  Code Status: Full code Family Communication: Husband and children at bedside Disposition Plan: To home when medically ready   Consultants:  None  Procedures:  None  Antibiotics:  None  HPI/Subjective: Tolerating clears, still some pain but better  Objective: Filed Vitals:   02/07/14 0525  BP: 147/78  Pulse: 105  Temp: 99.9 F (37.7 C)  Resp: 17    Intake/Output Summary (Last 24 hours) at 02/07/14 1441 Last data filed at 02/07/14 0557  Gross per 24 hour   Intake 2313.75 ml  Output    400 ml  Net 1913.75 ml   Filed Weights   02/05/14 1651 02/05/14 2325  Weight: 99.791 kg (220 lb) 100.6 kg (221 lb 12.5 oz)    Exam:  General: alert & oriented x 3 In NAD Cardiovascular: RRR, nl S1 s2 Respiratory: CTAB Abdomen: soft +BS UPPER ABDOMINAL tenderness>L, no rebound/ND, no masses palpable  Extremities: No cyanosis and no edema    Data Reviewed: Basic Metabolic Panel:  Recent Labs Lab 02/05/14 1700 02/06/14 0140  NA 130* 134*  K 4.7 4.3  CL 89* 96  CO2 18* 23  GLUCOSE 267* 274*  BUN 11 10  CREATININE 0.47* 0.56  CALCIUM 8.9 8.4   Liver Function Tests:  Recent Labs Lab 02/05/14 1700  AST 30  ALT 18  ALKPHOS 116  BILITOT 0.4  PROT 7.6  ALBUMIN 3.3*    Recent Labs Lab 02/05/14 1700 02/06/14 0140 02/07/14 1110  LIPASE 680* 299* 50   No results found for this basename: AMMONIA,  in the last 168 hours CBC:  Recent Labs Lab 02/05/14 1700 02/06/14 0140  WBC 17.2* 14.6*  NEUTROABS 15.1*  --   HGB 15.4* 13.4  HCT 42.5 39.2  MCV 84.3 86.0  PLT 356 311   Cardiac Enzymes: No results found for this basename: CKTOTAL, CKMB, CKMBINDEX, TROPONINI,  in the last 168 hours BNP (last 3 results) No results found for this basename: PROBNP,  in the last 8760 hours CBG:  Recent Labs Lab 02/06/14 1236 02/06/14 1712 02/06/14 2147 02/07/14 0741 02/07/14 1155  GLUCAP 233* 222* 219* 172* 179*    No results found for this or any previous visit (from the past 240 hour(s)).  Studies: US Abdomen Complete  02/05/2014   CLINICAL DATA:  Epigastric pain and nausea and vomiting for 1 day, chills  EXAM: ULTRASOUND ABDOMEN COMPLETE  COMPARISON:  None.  FINDINGS: Gallbladder:  There may be a very small volume of sludge although this is equivocal. No gallstones or gallbladder wall thickening. No Murphy's sign.  Common bile duct:  Diameter: Approximately 4 mm although evaluation is markedly limited.  Liver:  Liver is not well  evaluated. The liver appears to be sonographically dense. Cannot exclude hepatic abnormalities.  IVC:  Not identified  Pancreas:  No gross abnormalities but seen in very limited detail  Spleen:  Size and appearance within normal limits.  Right Kidney:  Length: 14 cm. Echogenicity within normal limits. No mass or hydronephrosis visualized.  Left Kidney:  Length: 13 cm. Echogenicity within normal limits. No mass or hydronephrosis visualized.  Abdominal aorta:  Cannot be evaluated due to body habitus and overlying bowel gas  Other findings:  None.  IMPRESSION: No acute findings, but sensitivity markedly reduced by the limitations described above.   Electronically Signed   By: Skipper Cliche M.D.   On: 02/05/2014 18:02    Scheduled Meds: . heparin  5,000 Units Subcutaneous 3 times per day  . insulin aspart  0-9 Units Subcutaneous TID WC  . insulin detemir  7 Units Subcutaneous QHS   Continuous Infusions: . sodium chloride 125 mL/hr at 02/07/14 1155    Principal Problem:   Acute pancreatitis Active Problems:   Tobacco abuse   Dehydration   Nausea with vomiting   Leukocytosis   Type II or unspecified type diabetes mellitus without mention of complication, uncontrolled   OA (osteoarthritis) of knee    Time spent: [facility] Pager [phone]. If 7PM-7AM, please contact night-coverage at [email or web address], password [facility] 02/07/2014, 2:41 PM  LOS: 2 days

## 2014-02-08 LAB — GLUCOSE, CAPILLARY
Glucose-Capillary: 175 mg/dL — ABNORMAL HIGH (ref 70–99)
Glucose-Capillary: 170 mg/dL — ABNORMAL HIGH (ref 70–99)

## 2014-02-08 LAB — CBC
HEMATOCRIT: 36.1 % (ref 36.0–46.0)
HEMOGLOBIN: 11.9 g/dL — AB (ref 12.0–15.0)
MCH: 29.3 pg (ref 26.0–34.0)
MCHC: 33 g/dL (ref 30.0–36.0)
MCV: 88.9 fL (ref 78.0–100.0)
Platelets: 284 10*3/uL (ref 150–400)
RBC: 4.06 MIL/uL (ref 3.87–5.11)
RDW: 16.3 % — AB (ref 11.5–15.5)
WBC: 11.8 10*3/uL — ABNORMAL HIGH (ref 4.0–10.5)

## 2014-02-08 LAB — BASIC METABOLIC PANEL
Anion gap: 13 (ref 5–15)
BUN: 7 mg/dL (ref 6–23)
CO2: 25 mEq/L (ref 19–32)
Calcium: 8.5 mg/dL (ref 8.4–10.5)
Chloride: 99 mEq/L (ref 96–112)
Creatinine, Ser: 0.53 mg/dL (ref 0.50–1.10)
GFR calc Af Amer: >90 mL/min (ref >90)
Glucose, Bld: 145 mg/dL — ABNORMAL HIGH (ref 70–99)
Potassium: 3.5 mEq/L — ABNORMAL LOW (ref 3.7–5.3)
Sodium: 137 mEq/L (ref 137–147)

## 2014-02-08 LAB — LIPASE, BLOOD: Lipase: 50 U/L (ref 11–59)

## 2014-02-08 MED ORDER — INSULIN DETEMIR 100 UNIT/ML FLEXPEN: 12.0000 [IU] | PEN_INJECTOR | Freq: Every day | SUBCUTANEOUS | Status: DC

## 2014-02-08 MED ORDER — INSULIN PEN NEEDLE 31G X 6 MM MISC: Status: DC

## 2014-02-08 MED ORDER — INSULIN STARTER KIT- PEN NEEDLES (ENGLISH)
1.0000 | Freq: Once | Status: DC
  Filled 2014-02-08: qty 1

## 2014-02-08 MED ORDER — SIMVASTATIN 20 MG PO TABS: 20.0000 mg | ORAL_TABLET | Freq: Every day | ORAL | Status: DC

## 2014-02-08 MED ORDER — GLUCOSE BLOOD VI STRP: ORAL_STRIP | Status: AC

## 2014-02-08 MED ORDER — FREESTYLE LANCETS MISC: Status: AC

## 2014-02-08 MED ORDER — HYDROCODONE-ACETAMINOPHEN 5-300 MG PO TABS: 5.0000 mg | ORAL_TABLET | ORAL | Status: DC | PRN

## 2014-02-08 MED ORDER — LIVING WELL WITH DIABETES BOOK
Freq: Once | Status: AC
  Administered 2014-02-08: 14:00:00
  Filled 2014-02-08: qty 1

## 2014-02-08 NOTE — Progress Notes (Signed)
Inpatient Diabetes Program Recommendations  AACE/ADA: New Consensus Statement on Inpatient Glycemic Control (2013)  Target Ranges:  Prepandial:   less than 140 mg/dL      Peak postprandial:   less than 180 mg/dL (1-2 hours)      Critically ill patients:  140 - 180 mg/dL   Inpatient Diabetes Program Recommendations Outpatient Referral: OP education ordered for [facility] (they will call pt to schedule appt)  Note: This coordinator attempted to visit patient to demonstrate the insulin pen and answer any questions.  The patient had already discharged but did watch the DM videos and the RN educated patient on insulin administration.    Thank you  Raoul Pitch BSN, RN,CDE Inpatient Diabetes Coordinator [phone] (team pager)

## 2014-02-08 NOTE — Progress Notes (Signed)
Pt. Discharge to home.  Discharge instruction given to patient.  Diabetic book and kit given to patient.  Patient also viewed the diabetic management video.  No question verbalized.  Patient unable to wait for the Diabetic OD education and left recently.

## 2014-02-08 NOTE — Discharge Summary (Addendum)
Physician Discharge Summary  Teresa LEWAN [id number] DOB: Jun 29, 1971 DOA: 02/05/2014  PCP: No primary provider on file.  Admit date: 02/05/2014 Discharge date: 02/08/2014  Time spent: >30 minutes  Recommendations for Outpatient Follow-up:  Follow-up Information   Please follow up. (PCP in 1-2weeks, call for appt upon discharge- keep log of sugars Teresa Parks take Parks follow up appt)       Discharge Diagnoses:  Principal Problem:   Acute pancreatitis Active Problems:   Tobacco abuse   Dehydration   Nausea with vomiting   Leukocytosis   Type II or unspecified type diabetes mellitus without mention of complication, uncontrolled- new onset   OA (osteoarthritis) of knee   Discharge Condition: Improved/stable  Diet recommendation: Modified carbohydrate  Filed Weights   02/05/14 1651 02/05/14 2325  Weight: 99Teresa791 kg (220 lb) 100Teresa6 kg (221 lb 12Teresa5 oz)    History of present illness:  Teresa Parks Teresa Parks 43 yTeresao. female with pmh significant for Tobacco abuse, obesity, OA Teresa Parks hx of hypertriglyceridemia. Came Parks ED complaining of abd pain, nausea, vomiting Teresa Parks difficulty keep things down. Patient was found with pancreatitis (lipase 680) Teresa Parks also dehydrated (specific gravity 1Teresa034);  Patient denies, fever, CP, hematemesis, melena, hematochezia, orthopnea or any other complaints. She was admitted for further evaluation Teresa Parks management.  Hospital Course:  1-Acute pancreatitis: secondary Parks triglycerides (has hx of elevated TG in Parks past); patient denies ETOH or any recreational drug abuse. In Parks ED ab US demonstrated no cholecystitis, no gallstones Teresa Parks no dilatation of bile ducts.  -Triglycerides still markedly elevated 2826  -She was managed conservative with IV fluids, analgesics Teresa Parks antiemetics  - she improved clinically Teresa Parks her lipase normalized, she Teresa tolerating by mouth well -She was started on antilipid meds Teresa discussed below, she Teresa Parks followup with her PCP 2-Tobacco abuse:  cessation counseling provided.  -patient declined nicotine patch  3-Dehydration Teresa Parks hyponatremia: due Parks no PO intake with N/V Teresa Parks abd pain.  -Improved with hydration, follow  4-Leukocytosis: no source of infection appreciated  -Likely reactive secondary Parks #1, trending down  -no antibiotics will Parks started at this moment  5-Type II or unspecified type diabetes mellitus without mention of complication, uncontrolled: A1C 9Teresa4  -Following admission Teresa Parks further discussion with patient she states that she had been checking her blood sugars at home in Parks past few weeks of Parks high Teresa she had not been previously diagnosed with diabetes Teresa Parks was on no medications -Upon admission she was started on Levemir, Accu-Cheks were monitored Teresa Parks she was cut with sliding scale insulin -A1c was done Teresa Parks elevated at 9Teresa4 Teresa above -Teresa per best. practice recommendations she'll need Parks Parks on insulin-Levemir on discharge Teresa Parks she'll followup with PCP for continued monitoring consider adding meal coverage/ssi on followup pending log of her sugars that she's been instructed Parks take on followup visit -She Teresa also Parks Parks set up for outpatient diabetes teaching 6-OA (osteoarthritis) of knee: continue PRN pain meds  7. Hypertriglyceridemia/hypercholesterolemia  -will antilipid meds once able Parks tolerate by mouth well  -states she was started on fenofibrate in Parks past Teresa Parks she filled Parks prescription Teresa never has taken it b/c she does not Parks taking pills- educated on Parks importance of compliance with her family present Teresa Parks she states she will take Parks meds  -She Teresa Parks start taking Parks fenofibrate that she has at home already, Teresa Parks Parks continue Parks Zocor started here Teresa Parks she Teresa Parks follow up outpatient with PCP for  recheck Teresa Parks dose adjustment of her meds Teresa appropriateTeresaher baseline LFTs were within normal limits.     Procedures:  None  Consultations:  None  Discharge Exam: Filed Vitals:   02/08/14 0959  BP: 127/83   Pulse: 94  Temp: 97Teresa8 F (36Teresa6 C)  Resp: 16    Exam:  General: alert & oriented x 3 In NAD  Cardiovascular: RRR, nl S1 s2  Respiratory: CTAB  Abdomen: soft +BS much DECREASED tenderness>L, no rebound/ND, no masses palpable  Extremities: No cyanosis Teresa Parks no edema    Discharge Instructions You were cared for by Parks hospitalist during your hospital stay. If you have any questions about your discharge medications or Parks care you received while you were in Parks hospital Teresa Parks you are discharged, you can call Parks unit Teresa Parks asked Parks speak with Parks hospitalist on call if Parks hospitalist that took care of you Teresa not available. Once you are discharged, your primary care physician will handle any further medical issues. Please note that NO REFILLS for any discharge medications will Parks authorized once you are discharged, Teresa it Teresa imperative that you return Parks your primary care physician (or establish Parks relationship with Parks primary care physician if you do not have one) for your aftercare needs so that they can reassess your need for medications Teresa Parks monitor your lab values.  Discharge Instructions   Diet Carb Modified    Complete by:  Teresa directed      Discharge instructions    Complete by:  Teresa directed   Check blood sugar before meals Teresa Parks at bedtime keep Parks log Teresa Parks take Parks Parks PCP on followup visit     Increase activity slowly    Complete by:  Teresa directed             Medication List         freestyle lancets  Use Teresa instructed     glucose blood test strip  Use Teresa instructed     Insulin Detemir 100 UNIT/ML Pen  Commonly known Teresa:  LEVEMIR  Inject 12 Units into Parks skin daily at 10 pm.     Insulin Pen Needle 31G X 6 MM Misc  Commonly known Teresa:  1ST CHOICE PEN NEEDLES  For insulin injections Teresa directed     simvastatin 20 MG tablet  Commonly known Teresa:  ZOCOR  Take 1 tablet (20 mg total) by mouth daily at 6 PM.       hydrocodone-acetaminophen 5-300 mg tabs Take 5 mg by mouth every 4  hours when necessary Fenofibrate -Teresa previously prescribed   Allergies  Allergen Reactions  . Novocain [Procaine] Nausea Teresa Parks Vomiting       Follow-up Information   Please follow up. (PCP in 1-2weeks, call for appt upon discharge- keep log of sugars Teresa Parks take Parks follow up appt)        Parks results of significant diagnostics from this hospitalization (including imaging, microbiology, ancillary Teresa Parks laboratory) are listed below for reference.    Significant Diagnostic Studies: US Abdomen Complete  02/05/2014   CLINICAL DATA:  Epigastric pain Teresa Parks nausea Teresa Parks vomiting for 1 day, chills  EXAM: ULTRASOUND ABDOMEN COMPLETE  COMPARISON:  None.  FINDINGS: Gallbladder:  There may Parks Parks very small volume of sludge although this Teresa equivocal. No gallstones or gallbladder wall thickening. No Murphy's sign.  Common bile duct:  Diameter: Approximately 4 mm although evaluation Teresa markedly limited.  Liver:  Liver Teresa not well evaluated. Parks liver appears  Parks Parks sonographically dense. Cannot exclude hepatic abnormalities.  IVC:  Not identified  Pancreas:  No gross abnormalities Teresa seen in very limited detail  Spleen:  Size Teresa Parks appearance within normal limits.  Right Kidney:  Length: 14 cm. Echogenicity within normal limits. No mass or hydronephrosis visualized.  Left Kidney:  Length: 13 cm. Echogenicity within normal limits. No mass or hydronephrosis visualized.  Abdominal aorta:  Cannot Parks evaluated due Parks body habitus Teresa Parks overlying bowel gas  Other findings:  None.  IMPRESSION: No acute findings, Teresa sensitivity markedly reduced by Parks limitations described above.   Electronically Signed   By: Skipper Cliche MTeresaD.   On: 02/05/2014 18Teresa02    Microbiology: No results found for this or any previous visit (from Parks past 240 hour(s)).   Labs: Basic Metabolic Panel:  Recent Labs Lab 02/05/14 1700 02/06/14 0140 02/08/14 0650  NA 130* 134* 137  K 4Teresa7 4Teresa3 3Teresa5*  CL 89* 96 99  CO2 18* 23 25  GLUCOSE 267* 274* 145*   BUN 11 10 7   CREATININE 0Teresa47* 0Teresa56 0Teresa53  CALCIUM 8Teresa9 8Teresa4 8Teresa5   Liver Function Tests:  Recent Labs Lab 02/05/14 1700  AST 30  ALT 18  ALKPHOS 116  BILITOT 0Teresa4  PROT 7Teresa6  ALBUMIN 3Teresa3*    Recent Labs Lab 02/05/14 1700 02/06/14 0140 02/07/14 1110 02/08/14 0650  LIPASE 680* 299* 50 50   No results found for this basename: AMMONIA,  in Parks last 168 hours CBC:  Recent Labs Lab 02/05/14 1700 02/06/14 0140 02/08/14 0650  WBC 17Teresa2* 14Teresa6* 11Teresa8*  NEUTROABS 15Teresa1*  --   --   HGB 15Teresa4* 13Teresa4 11Teresa9*  HCT 42Teresa5 39Teresa2 36Teresa1  MCV 84Teresa3 86Teresa0 88Teresa9  PLT 356 311 284   Cardiac Enzymes: No results found for this basename: CKTOTAL, CKMB, CKMBINDEX, TROPONINI,  in Parks last 168 hours BNP: BNP (last 3 results) No results found for this basename: PROBNP,  in Parks last 8760 hours CBG:  Recent Labs Lab 02/07/14 0741 02/07/14 1155 02/07/14 1708 02/07/14 2224 02/08/14 0752  GLUCAP 172* 179* 162* 182* 175*       Signed:  Eduardo Wurth C  [facility] 02/08/2014, 11Teresa26 AM

## 2014-02-08 NOTE — Care Management Note (Signed)
  Page 1 of 1   02/08/2014     11:21:18 AM CARE MANAGEMENT NOTE 02/08/2014  Patient:  Teresa Parks, Teresa Parks   Account Number:  [id number]  Date Initiated:  02/08/2014  Documentation initiated by:  Magdalen Spatz  Subjective/Objective Assessment:     Action/Plan:   Anticipated DC Date:  02/08/2014   Anticipated DC Plan:  [facility]      Choice offered to / List presented to:             Status of service:   Medicare Important Message given?   (If response is "NO", the following Medicare IM given date fields will be blank) Date Medicare IM given:   Medicare IM given by:   Date Additional Medicare IM given:   Additional Medicare IM given by:    Discharge Disposition:    Per UR Regulation:    If discussed at Long Length of Stay Meetings, dates discussed:    Comments:  02-08-14 Referral for PCP . Spoke to patient , she has BCBS presently but " is about to lose it " . Apparently BCBS is through school. Information on [place] given to patient . Magdalen Spatz RN BSN [phone]

## 2014-03-04 ENCOUNTER — Encounter: Payer: Self-pay | Admitting: Physician Assistant

## 2014-03-04 ENCOUNTER — Ambulatory Visit (INDEPENDENT_AMBULATORY_CARE_PROVIDER_SITE_OTHER): Payer: Self-pay | Admitting: Physician Assistant

## 2014-03-04 VITALS — BP 144/84 | HR 77 | Temp 98.7°F | Resp 18 | Ht 66.0 in | Wt 217.0 lb

## 2014-03-04 DIAGNOSIS — E781 Pure hyperglyceridemia: Secondary | ICD-10-CM

## 2014-03-04 DIAGNOSIS — K859 Acute pancreatitis without necrosis or infection, unspecified: Secondary | ICD-10-CM

## 2014-03-04 DIAGNOSIS — K8500 Idiopathic acute pancreatitis without necrosis or infection: Secondary | ICD-10-CM

## 2014-03-04 DIAGNOSIS — IMO0002 Reserved for concepts with insufficient information to code with codable children: Secondary | ICD-10-CM

## 2014-03-04 DIAGNOSIS — IMO0001 Reserved for inherently not codable concepts without codable children: Secondary | ICD-10-CM

## 2014-03-04 DIAGNOSIS — E1165 Type 2 diabetes mellitus with hyperglycemia: Secondary | ICD-10-CM

## 2014-03-04 LAB — COMPLETE METABOLIC PANEL WITHOUT GFR
ALT: 20 U/L (ref 0–35)
AST: 18 U/L (ref 0–37)
Albumin: 4.2 g/dL (ref 3.5–5.2)
Alkaline Phosphatase: 63 U/L (ref 39–117)
BUN: 11 mg/dL (ref 6–23)
CO2: 25 meq/L (ref 19–32)
Calcium: 9.1 mg/dL (ref 8.4–10.5)
Chloride: 105 meq/L (ref 96–112)
Creat: 0.72 mg/dL (ref 0.50–1.10)
GFR, Est African American: >89 mL/min
GFR, Est Non African American: >89 mL/min
Glucose, Bld: 164 mg/dL — ABNORMAL HIGH (ref 70–99)
Potassium: 4.5 meq/L (ref 3.5–5.3)
Sodium: 139 meq/L (ref 135–145)
Total Bilirubin: 0.4 mg/dL (ref 0.2–1.2)
Total Protein: 6.8 g/dL (ref 6.0–8.3)

## 2014-03-04 LAB — LIPID PANEL
Cholesterol: 103 mg/dL (ref 0–200)
HDL: 26 mg/dL — AB (ref >39)
LDL Cholesterol: 7 mg/dL (ref 0–99)
TRIGLYCERIDES: 351 mg/dL — AB (ref <150)
Total CHOL/HDL Ratio: 4 Ratio
VLDL: 70 mg/dL — ABNORMAL HIGH (ref 0–40)

## 2014-03-04 LAB — LDL CHOLESTEROL, DIRECT: Direct LDL: 38 mg/dL

## 2014-03-04 MED ORDER — INSULIN DETEMIR 100 UNIT/ML FLEXPEN: 12.0000 [IU] | PEN_INJECTOR | Freq: Every day | SUBCUTANEOUS | Status: DC

## 2014-03-04 MED ORDER — FENOFIBRATE 145 MG PO TABS: 145.0000 mg | ORAL_TABLET | Freq: Every day | ORAL | Status: DC

## 2014-03-04 MED ORDER — SIMVASTATIN 20 MG PO TABS: 20.0000 mg | ORAL_TABLET | Freq: Every day | ORAL | Status: DC

## 2014-03-04 NOTE — Patient Instructions (Signed)
Please obtain labs.  I will call you with your results.  Continue dietary changes.  Set up an appointment with the Diabetic Nutritionist.   Continue monitoring your blood sugars at least each morning (fasting) and each evening.  Goal AM blood glucose is 80-120s.  Goal PM glucose is < 180.  Increase the Levemir to 14 units.  This will hopefully get your blood sugars into goal range.  Do not skip meals. Continue other medications as directed.  Follow-up with me in 1 month.   Diabetes and Foot Care Diabetes may cause you to have problems because of poor blood supply (circulation) to your feet and legs. This may cause the skin on your feet to become thinner, break easier, and heal more slowly. Your skin may become dry, and the skin may peel and crack. You may also have nerve damage in your legs and feet causing decreased feeling in them. You may not notice minor injuries to your feet that could lead to infections or more serious problems. Taking care of your feet is one of the most important things you can do for yourself.  HOME CARE INSTRUCTIONS  Wear shoes at all times, even in the house. Do not go barefoot. Bare feet are easily injured.  Check your feet daily for blisters, cuts, and redness. If you cannot see the bottom of your feet, use a mirror or ask someone for help.  Wash your feet with warm water (do not use hot water) and mild soap. Then pat your feet and the areas between your toes until they are completely dry. Do not soak your feet as this can dry your skin.  Apply a moisturizing lotion or petroleum jelly (that does not contain alcohol and is unscented) to the skin on your feet and to dry, brittle toenails. Do not apply lotion between your toes.  Trim your toenails straight across. Do not dig under them or around the cuticle. File the edges of your nails with an emery board or nail file.  Do not cut corns or calluses or try to remove them with medicine.  Wear clean socks or stockings every  day. Make sure they are not too tight. Do not wear knee-high stockings since they may decrease blood flow to your legs.  Wear shoes that fit properly and have enough cushioning. To break in new shoes, wear them for just a few hours a day. This prevents you from injuring your feet. Always look in your shoes before you put them on to be sure there are no objects inside.  Do not cross your legs. This may decrease the blood flow to your feet.  If you find a minor scrape, cut, or break in the skin on your feet, keep it and the skin around it clean and dry. These areas may be cleansed with mild soap and water. Do not cleanse the area with peroxide, alcohol, or iodine.  When you remove an adhesive bandage, be sure not to damage the skin around it.  If you have a wound, look at it several times a day to make sure it is healing.  Do not use heating pads or hot water bottles. They may burn your skin. If you have lost feeling in your feet or legs, you may not know it is happening until it is too late.  Make sure your health care provider performs a complete foot exam at least annually or more often if you have foot problems. Report any cuts, sores, or bruises  to your health care provider immediately. SEEK MEDICAL CARE IF:   You have an injury that is not healing.  You have cuts or breaks in the skin.  You have an ingrown nail.  You notice redness on your legs or feet.  You feel burning or tingling in your legs or feet.  You have pain or cramps in your legs and feet.  Your legs or feet are numb.  Your feet always feel cold. SEEK IMMEDIATE MEDICAL CARE IF:   There is increasing redness, swelling, or pain in or around a wound.  There is a red line that goes up your leg.  Pus is coming from a wound.  You develop a fever or as directed by your health care provider.  You notice a bad smell coming from an ulcer or wound. Document Released: 06/29/2000 Document Revised: 03/04/2013 Document  Reviewed: 12/09/2012 [facility] Patient Information 2015 [place], [place]. This information is not intended to replace advice given to you by your health care provider. Make sure you discuss any questions you have with your health care provider.

## 2014-03-04 NOTE — Progress Notes (Signed)
Pre visit review using our clinic review tool, if applicable. No additional management support is needed unless otherwise documented below in the visit note/SLS  

## 2014-03-04 NOTE — Progress Notes (Signed)
Patient presents to clinic today to establish care.  Patient also presents for ER followup for acute pancreatitis. Patient presented to the ER on 02/05/14 with complaints of epigastric pain and nausea.  Lipase was found to be elevated > 2000.  Patient placed on NPO and given IV fluids and pain medication while further workup was obtained.  Patient stabilized before discharge. Sent home on combination of Tricor and Zocor.  Patient also diagnosed with DM II (see below).  Was started on Insulin therapy.  Patient endorses doing very well since discharge. Denies abdominal pain, nausea or vomiting. Is taking medications as directed.  Needs follow-up labs.  Chronic Issues: Diabetes Mellitus II -- recent A1C of 9.4.  Patient currently on Levemir. Taking 14 units at bedtime. Endorses a.m. fasting blood glucose between 120 and 140. Evening glucoses running between 150-170.  Is currently on Lisinopril 10 mg daily.   Hypertension -- Currently on Lisinopril 10 mg daily.  Denies chronic cough.  BP acceptable today.  Hypertriglyceridemia -- Causing recent acute pancreatitis.  Currently on Tricor and Zocor.  Needs repeat labs.  Health Maintenance: Dental -- Overdue Vision -- Followed by Ophthalmology -- Dr. Bing Plume Immunizations -- TDaP up-to-date Mammogram -- Never had mammogram.  Wishes to think about it.    Past Medical History  Diagnosis Date  . Pancreatitis, acute   . Diabetes mellitus type II, controlled   . Hyperlipidemia   . Chronic ear infection     Past Surgical History  Procedure Laterality Date  . Hernia repair    . Knee surgery    . Wisdom tooth extraction      Current Outpatient Prescriptions on File Prior to Visit  Medication Sig Dispense Refill  . glucose blood test strip Use as instructed  100 each  12  . Hydrocodone-Acetaminophen (VICODIN) 5-300 MG TABS Take 5 mg by mouth every 4 (four) hours as needed.  20 each  0  . Insulin Pen Needle (1ST CHOICE PEN NEEDLES) 31G X 6 MM MISC For  insulin injections as directed  100 each  0  . Lancets (FREESTYLE) lancets Use as instructed  100 each  12   No current facility-administered medications on file prior to visit.    Allergies  Allergen Reactions  . Novocain [Procaine] Nausea And Vomiting    Family History  Problem Relation Age of Onset  . Heart attack Father 28    Deceased  . Diabetes Father   . Diabetes Paternal Uncle   . Diabetes Mother   . Hypertension Mother     Living  . Alzheimer's disease Maternal Grandfather   . Healthy Brother   . Healthy Daughter     History   Social History  . Marital Status: Married    Spouse Name: N/A    Number of Children: N/A  . Years of Education: N/A   Occupational History  . Not on file.   Social History Main Topics  . Smoking status: Current Every Day Smoker  . Smokeless tobacco: Not on file  . Alcohol Use: No  . Drug Use: No  . Sexual Activity:    Other Topics Concern  . Not on file   Social History Narrative  . No narrative on file   ROS See HPI.  All other ROS are negative.  BP 144/84  Pulse 77  Temp(Src) 98.7 F (37.1 C) (Oral)  Resp 18  Ht 5' 6" (1.676 m)  Wt 217 lb (98.431 kg)  BMI 35.04 kg/m2  SpO2 98%  LMP 02/25/2014  Physical Exam  Constitutional: She is oriented to person, place, and time and well-developed, well-nourished, and in no distress.  HENT:  Head: Normocephalic and atraumatic.  Right Ear: External ear normal.  Left Ear: External ear normal.  Nose: Nose normal.  Mouth/Throat: Oropharynx is clear and moist. No oropharyngeal exudate.  TM within normal limits.  Eyes: Conjunctivae are normal. Pupils are equal, round, and reactive to light.  Neck: Neck supple.  Cardiovascular: Normal rate, regular rhythm, normal heart sounds and intact distal pulses.   Pulmonary/Chest: No respiratory distress. She has no wheezes. She has no rales. She exhibits no tenderness.  Neurological: She is alert and oriented to person, place, and time.   Skin: Skin is warm and dry. No rash noted.  Psychiatric: Affect normal.   Recent Results (from the past 2160 hour(s))  CBC WITH DIFFERENTIAL     Status: Abnormal   Collection Time    02/05/14  5:00 PM      Result Value Ref Range   WBC 17.2 (*) 4.0 - 10.5 K/uL   RBC 5.04  3.87 - 5.11 MIL/uL   Hemoglobin 15.4 (*) 12.0 - 15.0 g/dL   HCT 42.5  36.0 - 46.0 %   MCV 84.3  78.0 - 100.0 fL   MCH 30.6  26.0 - 34.0 pg   MCHC 36.2 (*) 30.0 - 36.0 g/dL   RDW 16.2 (*) 11.5 - 15.5 %   Platelets 356  150 - 400 K/uL   Neutrophils Relative % 88 (*) 43 - 77 %   Neutro Abs 15.1 (*) 1.7 - 7.7 K/uL   Lymphocytes Relative 6 (*) 12 - 46 %   Lymphs Abs 1.0  0.7 - 4.0 K/uL   Monocytes Relative 6  3 - 12 %   Monocytes Absolute 1.0  0.1 - 1.0 K/uL   Eosinophils Relative 0  0 - 5 %   Eosinophils Absolute 0.0  0.0 - 0.7 K/uL   Basophils Relative 0  0 - 1 %   Basophils Absolute 0.0  0.0 - 0.1 K/uL  BASIC METABOLIC PANEL     Status: Abnormal   Collection Time    02/05/14  5:00 PM      Result Value Ref Range   Sodium 130 (*) 137 - 147 mEq/L   Potassium 4.7  3.7 - 5.3 mEq/L   Chloride 89 (*) 96 - 112 mEq/L   CO2 18 (*) 19 - 32 mEq/L   Glucose, Bld 267 (*) 70 - 99 mg/dL   BUN 11  6 - 23 mg/dL   Creatinine, Ser 0.47 (*) 0.50 - 1.10 mg/dL   Calcium 8.9  8.4 - 10.5 mg/dL   GFR calc non Af Amer >90  >90 mL/min   GFR calc Af Amer >90  >90 mL/min   Comment: (NOTE)     The eGFR has been calculated using the CKD EPI equation.     This calculation has not been validated in all clinical situations.     eGFR's persistently <90 mL/min signify possible Chronic Kidney     Disease.   Anion gap 23 (*) 5 - 15   Comment: Performed at [facility] PANEL     Status: Abnormal   Collection Time    02/05/14  5:00 PM      Result Value Ref Range   Total Protein 7.6  6.0 - 8.3 g/dL   Albumin 3.3 (*) 3.5 - 5.2 g/dL   AST 30  0 - 37 U/L   ALT 18  0 - 35 U/L   Alkaline Phosphatase 116  39 - 117  U/L   Total Bilirubin 0.4  0.3 - 1.2 mg/dL   Bilirubin, Direct <0.2  0.0 - 0.3 mg/dL   Indirect Bilirubin NOT CALCULATED  0.3 - 0.9 mg/dL   Comment: Performed at [facility], BLOOD     Status: Abnormal   Collection Time    02/05/14  5:00 PM      Result Value Ref Range   Lipase 680 (*) 11 - 59 U/L   Comment: Performed at [facility] MICROSCOPIC     Status: Abnormal   Collection Time    02/05/14  5:00 PM      Result Value Ref Range   Color, Urine AMBER (*) YELLOW   Comment: BIOCHEMICALS MAY BE AFFECTED BY COLOR   APPearance TURBID (*) CLEAR   Specific Gravity, Urine 1.034 (*) 1.005 - 1.030   pH 5.5  5.0 - 8.0   Glucose, UA 250 (*) NEGATIVE mg/dL   Hgb urine dipstick SMALL (*) NEGATIVE   Bilirubin Urine SMALL (*) NEGATIVE   Ketones, ur 40 (*) NEGATIVE mg/dL   Protein, ur >300 (*) NEGATIVE mg/dL   Urobilinogen, UA 1.0  0.0 - 1.0 mg/dL   Nitrite NEGATIVE  NEGATIVE   Leukocytes, UA NEGATIVE  NEGATIVE  PREGNANCY, URINE     Status: None   Collection Time    02/05/14  5:00 PM      Result Value Ref Range   Preg Test, Ur NEGATIVE  NEGATIVE   Comment:            THE SENSITIVITY OF THIS     METHODOLOGY IS >20 mIU/mL.  URINE MICROSCOPIC-ADD ON     Status: Abnormal   Collection Time    02/05/14  5:00 PM      Result Value Ref Range   Squamous Epithelial / LPF FEW (*) RARE   WBC, UA 0-2  <3 WBC/hpf   RBC / HPF 3-6  <3 RBC/hpf   Bacteria, UA MANY (*) RARE   Urine-Other AMORPHOUS URATES/PHOSPHATES    URINE RAPID DRUG SCREEN (HOSP PERFORMED)     Status: Abnormal   Collection Time    02/06/14  1:39 AM      Result Value Ref Range   Opiates POSITIVE (*) NONE DETECTED   Cocaine NONE DETECTED  NONE DETECTED   Benzodiazepines NONE DETECTED  NONE DETECTED   Amphetamines NONE DETECTED  NONE DETECTED   Tetrahydrocannabinol NONE DETECTED  NONE DETECTED   Barbiturates NONE DETECTED  NONE DETECTED   Comment:            DRUG SCREEN FOR  MEDICAL PURPOSES     ONLY.  IF CONFIRMATION IS NEEDED     FOR ANY PURPOSE, NOTIFY LAB     WITHIN 5 DAYS.                LOWEST DETECTABLE LIMITS     FOR URINE DRUG SCREEN     Drug Class       Cutoff (ng/mL)     Amphetamine      1000     Barbiturate      200     Benzodiazepine   347     Tricyclics       425     Opiates          300  Cocaine          300     THC              50  TSH     Status: None   Collection Time    02/06/14  1:40 AM      Result Value Ref Range   TSH 3.460  0.350 - 4.500 uIU/mL  BASIC METABOLIC PANEL     Status: Abnormal   Collection Time    02/06/14  1:40 AM      Result Value Ref Range   Sodium 134 (*) 137 - 147 mEq/L   Potassium 4.3  3.7 - 5.3 mEq/L   Chloride 96  96 - 112 mEq/L   CO2 23  19 - 32 mEq/L   Glucose, Bld 274 (*) 70 - 99 mg/dL   BUN 10  6 - 23 mg/dL   Creatinine, Ser 0.56  0.50 - 1.10 mg/dL   Calcium 8.4  8.4 - 10.5 mg/dL   GFR calc non Af Amer >90  >90 mL/min   GFR calc Af Amer >90  >90 mL/min   Comment: (NOTE)     The eGFR has been calculated using the CKD EPI equation.     This calculation has not been validated in all clinical situations.     eGFR's persistently <90 mL/min signify possible Chronic Kidney     Disease.   Anion gap 15  5 - 15  CBC     Status: Abnormal   Collection Time    02/06/14  1:40 AM      Result Value Ref Range   WBC 14.6 (*) 4.0 - 10.5 K/uL   RBC 4.56  3.87 - 5.11 MIL/uL   Hemoglobin 13.4  12.0 - 15.0 g/dL   HCT 39.2  36.0 - 46.0 %   MCV 86.0  78.0 - 100.0 fL   MCH 29.4  26.0 - 34.0 pg   MCHC 34.2  30.0 - 36.0 g/dL   RDW 16.1 (*) 11.5 - 15.5 %   Platelets 311  150 - 400 K/uL  HEMOGLOBIN A1C     Status: Abnormal   Collection Time    02/06/14  1:40 AM      Result Value Ref Range   Hemoglobin A1C 9.4 (*) <5.7 %   Comment: (NOTE)                                                                               According to the ADA Clinical Practice Recommendations for 2011, when     HbA1c is used as a  screening test:      >=6.5%   Diagnostic of Diabetes Mellitus               (if abnormal result is confirmed)     5.7-6.4%   Increased risk of developing Diabetes Mellitus     References:Diagnosis and Classification of Diabetes Mellitus,Diabetes     BJYN,8295,62(ZHYQM 1):S62-S69 and Standards of Medical Care in             Diabetes - 2011,Diabetes Care,2011,34 (Suppl 1):S11-S61.   Mean Plasma Glucose 223 (*) <117 mg/dL   Comment: Performed at [facility]  LIPASE, BLOOD     Status: Abnormal   Collection Time    02/06/14  1:40 AM      Result Value Ref Range   Lipase 299 (*) 11 - 59 U/L  ETHANOL     Status: None   Collection Time    02/06/14  1:40 AM      Result Value Ref Range   Alcohol, Ethyl (B) <11  0 - 11 mg/dL   Comment:            LOWEST DETECTABLE LIMIT FOR     SERUM ALCOHOL IS 11 mg/dL     FOR MEDICAL PURPOSES ONLY  LIPID PANEL     Status: Abnormal   Collection Time    02/06/14  1:40 AM      Result Value Ref Range   Cholesterol 520 (*) 0 - 200 mg/dL   Triglycerides 2826 (*) <150 mg/dL   HDL NOT REPORTED DUE TO HIGH TRIGLYCERIDES  >39 mg/dL   Total CHOL/HDL Ratio NOT REPORTED DUE TO HIGH TRIGLYCERIDES     VLDL UNABLE TO CALCULATE IF TRIGLYCERIDE OVER 400 mg/dL  0 - 40 mg/dL   LDL Cholesterol UNABLE TO CALCULATE IF TRIGLYCERIDE OVER 400 mg/dL  0 - 99 mg/dL  GLUCOSE, CAPILLARY     Status: Abnormal   Collection Time    02/06/14  8:22 AM      Result Value Ref Range   Glucose-Capillary 271 (*) 70 - 99 mg/dL   Comment 1 Notify RN    GLUCOSE, CAPILLARY     Status: Abnormal   Collection Time    02/06/14 12:36 PM      Result Value Ref Range   Glucose-Capillary 233 (*) 70 - 99 mg/dL   Comment 1 Notify RN    GLUCOSE, CAPILLARY     Status: Abnormal   Collection Time    02/06/14  5:12 PM      Result Value Ref Range   Glucose-Capillary 222 (*) 70 - 99 mg/dL   Comment 1 Notify RN    GLUCOSE, CAPILLARY     Status: Abnormal   Collection Time    02/06/14  9:47 PM       Result Value Ref Range   Glucose-Capillary 219 (*) 70 - 99 mg/dL  GLUCOSE, CAPILLARY     Status: Abnormal   Collection Time    02/07/14  7:41 AM      Result Value Ref Range   Glucose-Capillary 172 (*) 70 - 99 mg/dL  LIPASE, BLOOD     Status: None   Collection Time    02/07/14 11:10 AM      Result Value Ref Range   Lipase 50  11 - 59 U/L  GLUCOSE, CAPILLARY     Status: Abnormal   Collection Time    02/07/14 11:55 AM      Result Value Ref Range   Glucose-Capillary 179 (*) 70 - 99 mg/dL  GLUCOSE, CAPILLARY     Status: Abnormal   Collection Time    02/07/14  5:08 PM      Result Value Ref Range   Glucose-Capillary 162 (*) 70 - 99 mg/dL  GLUCOSE, CAPILLARY     Status: Abnormal   Collection Time    02/07/14 10:24 PM      Result Value Ref Range   Glucose-Capillary 182 (*) 70 - 99 mg/dL  CBC     Status: Abnormal   Collection Time    02/08/14  6:50 AM  Result Value Ref Range   WBC 11.8 (*) 4.0 - 10.5 K/uL   RBC 4.06  3.87 - 5.11 MIL/uL   Hemoglobin 11.9 (*) 12.0 - 15.0 g/dL   HCT 36.1  36.0 - 46.0 %   MCV 88.9  78.0 - 100.0 fL   MCH 29.3  26.0 - 34.0 pg   MCHC 33.0  30.0 - 36.0 g/dL   RDW 16.3 (*) 11.5 - 15.5 %   Platelets 284  150 - 400 K/uL  BASIC METABOLIC PANEL     Status: Abnormal   Collection Time    02/08/14  6:50 AM      Result Value Ref Range   Sodium 137  137 - 147 mEq/L   Potassium 3.5 (*) 3.7 - 5.3 mEq/L   Chloride 99  96 - 112 mEq/L   CO2 25  19 - 32 mEq/L   Glucose, Bld 145 (*) 70 - 99 mg/dL   BUN 7  6 - 23 mg/dL   Creatinine, Ser 0.53  0.50 - 1.10 mg/dL   Calcium 8.5  8.4 - 10.5 mg/dL   GFR calc non Af Amer >90  >90 mL/min   GFR calc Af Amer >90  >90 mL/min   Comment: (NOTE)     The eGFR has been calculated using the CKD EPI equation.     This calculation has not been validated in all clinical situations.     eGFR's persistently <90 mL/min signify possible Chronic Kidney     Disease.   Anion gap 13  5 - 15  LIPASE, BLOOD     Status: None    Collection Time    02/08/14  6:50 AM      Result Value Ref Range   Lipase 50  11 - 59 U/L  GLUCOSE, CAPILLARY     Status: Abnormal   Collection Time    02/08/14  7:52 AM      Result Value Ref Range   Glucose-Capillary 175 (*) 70 - 99 mg/dL  GLUCOSE, CAPILLARY     Status: Abnormal   Collection Time    02/08/14 11:46 AM      Result Value Ref Range   Glucose-Capillary 170 (*) 70 - 99 mg/dL  LIPID PANEL     Status: Abnormal   Collection Time    03/04/14  8:49 AM      Result Value Ref Range   Cholesterol 103  0 - 200 mg/dL   Comment: ATP III Classification:           < 200        mg/dL        Desirable          200 - 239     mg/dL        Borderline High          >= 240        mg/dL        High         Triglycerides 351 (*) <150 mg/dL   HDL 26 (*) >39 mg/dL   Total CHOL/HDL Ratio 4.0     VLDL 70 (*) 0 - 40 mg/dL   LDL Cholesterol 7  0 - 99 mg/dL   Comment:       Total Cholesterol/HDL Ratio:CHD Risk                            Coronary Heart Disease Risk Table  Men       Women              1/2 Average Risk              3.4        3.3                  Average Risk              5.0        4.4               2X Average Risk              9.6        7.1               3X Average Risk             23.4       11.0     Use the calculated Patient Ratio above and the CHD Risk table      to determine the patient's CHD Risk.     ATP III Classification (LDL):           < 100        mg/dL         Optimal          100 - 129     mg/dL         Near or Above Optimal          130 - 159     mg/dL         Borderline High          160 - 189     mg/dL         High           > 190        mg/dL         Very High        LDL CHOLESTEROL, DIRECT     Status: None   Collection Time    03/04/14  8:49 AM      Result Value Ref Range   Direct LDL 38     Comment: ATP III Classification (LDL):           < 100        mg/dL         Optimal          100 - 129     mg/dL          Near or Above Optimal          130 - 159     mg/dL         Borderline High          160 - 189     mg/dL         High           > 190        mg/dL         Very High        URINALYSIS, MICROSCOPIC ONLY     Status: None   Collection Time    03/04/14  8:49 AM      Result Value Ref Range   Squamous Epithelial / LPF FEW  RARE   Crystals NONE SEEN  NONE SEEN   Casts NONE SEEN  NONE SEEN   WBC, UA 0-2  <3 WBC/hpf  RBC / HPF 0-2  <3 RBC/hpf   Bacteria, UA RARE  RARE  MICROALBUMIN / CREATININE URINE RATIO     Status: Abnormal   Collection Time    03/04/14  8:49 AM      Result Value Ref Range   Microalb, Ur 4.92 (*) 0.00 - 1.89 mg/dL   Creatinine, Urine 110.2     Microalb Creat Ratio 44.6 (*) 0.0 - 30.0 mg/g  COMPLETE METABOLIC PANEL WITH GFR     Status: Abnormal   Collection Time    03/04/14  8:49 AM      Result Value Ref Range   Sodium 139  135 - 145 mEq/L   Potassium 4.5  3.5 - 5.3 mEq/L   Chloride 105  96 - 112 mEq/L   CO2 25  19 - 32 mEq/L   Glucose, Bld 164 (*) 70 - 99 mg/dL   BUN 11  6 - 23 mg/dL   Creat 0.72  0.50 - 1.10 mg/dL   Total Bilirubin 0.4  0.2 - 1.2 mg/dL   Alkaline Phosphatase 63  39 - 117 U/L   AST 18  0 - 37 U/L   ALT 20  0 - 35 U/L   Total Protein 6.8  6.0 - 8.3 g/dL   Albumin 4.2  3.5 - 5.2 g/dL   Calcium 9.1  8.4 - 10.5 mg/dL   GFR, Est African American >89     GFR, Est Non African American >89     Comment:       The estimated GFR is a calculation valid for adults (>=33 years old)     that uses the CKD-EPI algorithm to adjust for age and sex. It is       not to be used for children, pregnant women, hospitalized patients,        patients on dialysis, or with rapidly changing kidney function.     According to the NKDEP, eGFR >89 is normal, 60-89 shows mild     impairment, 30-59 shows moderate impairment, 15-29 shows severe     impairment and <15 is ESRD.         Assessment/Plan: Hypertriglyceridemia Patient to continue current regimen.  Will  obtain labs to include fasting lipid profile and lipase level.  Type II diabetes mellitus, uncontrolled Continue current regimen.  Will check A1C every 3 months until < 7.5.  Followed by Dr. Bing Plume (Ophthalmology).  Continue ACEI as directed.   Acute pancreatitis Resolving. No persistent abdominal pain, nausea or vomiting.  Will check repeat labs today to ensure resolution of elevated lipase level.

## 2014-03-05 ENCOUNTER — Telehealth: Payer: Self-pay | Admitting: Physician Assistant

## 2014-03-05 DIAGNOSIS — IMO0002 Reserved for concepts with insufficient information to code with codable children: Secondary | ICD-10-CM

## 2014-03-05 DIAGNOSIS — E1165 Type 2 diabetes mellitus with hyperglycemia: Secondary | ICD-10-CM

## 2014-03-05 LAB — URINALYSIS, MICROSCOPIC ONLY
CASTS: NONE SEEN
Crystals: NONE SEEN

## 2014-03-05 LAB — MICROALBUMIN / CREATININE URINE RATIO
Creatinine, Urine: 110.2 mg/dL
MICROALB UR: 4.92 mg/dL — AB (ref 0.00–1.89)
Microalb Creat Ratio: 44.6 mg/g — ABNORMAL HIGH (ref 0.0–30.0)

## 2014-03-05 MED ORDER — LISINOPRIL 10 MG PO TABS: 5.0000 mg | ORAL_TABLET | Freq: Every day | ORAL | Status: DC

## 2014-03-05 NOTE — Telephone Encounter (Signed)
Triglycerides are much improved.  Would recommend we continue the Fenofibrate (Tricor) in addition to the statin medication.  Will recheck lipids in 6 months.  Urine shows that diabetes is affecting the blood vessels in her kidneys.  Will need to start a medication to protect them.  This will also keep blood pressure in check.  I have sent in an Rx for Lisinopril 5 mg to take daily.  Follow-up with me in 1 month.

## 2014-03-05 NOTE — Telephone Encounter (Signed)
Relevant patient education mailed to patient.  

## 2014-03-15 DIAGNOSIS — E781 Pure hyperglyceridemia: Secondary | ICD-10-CM | POA: Insufficient documentation

## 2014-03-15 DIAGNOSIS — E1165 Type 2 diabetes mellitus with hyperglycemia: Secondary | ICD-10-CM | POA: Insufficient documentation

## 2014-03-15 DIAGNOSIS — IMO0002 Reserved for concepts with insufficient information to code with codable children: Secondary | ICD-10-CM | POA: Insufficient documentation

## 2014-03-15 DIAGNOSIS — E119 Type 2 diabetes mellitus without complications: Secondary | ICD-10-CM | POA: Insufficient documentation

## 2014-03-15 NOTE — Assessment & Plan Note (Signed)
Resolving. No persistent abdominal pain, nausea or vomiting.  Will check repeat labs today to ensure resolution of elevated lipase level.

## 2014-03-15 NOTE — Assessment & Plan Note (Signed)
Patient to continue current regimen.  Will obtain labs to include fasting lipid profile and lipase level.

## 2014-03-15 NOTE — Assessment & Plan Note (Signed)
Continue current regimen.  Will check A1C every 3 months until < 7.5.  Followed by Dr. Bing Plume (Ophthalmology).  Continue ACEI as directed.

## 2014-03-26 ENCOUNTER — Encounter: Payer: Self-pay | Admitting: Physician Assistant

## 2014-03-26 ENCOUNTER — Ambulatory Visit (INDEPENDENT_AMBULATORY_CARE_PROVIDER_SITE_OTHER): Payer: BC Managed Care – PPO | Admitting: Physician Assistant

## 2014-03-26 VITALS — BP 140/98 | HR 86 | Temp 98.3°F | Ht 66.0 in | Wt 216.4 lb

## 2014-03-26 DIAGNOSIS — H6504 Acute serous otitis media, recurrent, right ear: Secondary | ICD-10-CM

## 2014-03-26 DIAGNOSIS — R7989 Other specified abnormal findings of blood chemistry: Secondary | ICD-10-CM

## 2014-03-26 DIAGNOSIS — H6500 Acute serous otitis media, unspecified ear: Secondary | ICD-10-CM

## 2014-03-26 DIAGNOSIS — B9789 Other viral agents as the cause of diseases classified elsewhere: Secondary | ICD-10-CM

## 2014-03-26 DIAGNOSIS — E781 Pure hyperglyceridemia: Secondary | ICD-10-CM

## 2014-03-26 DIAGNOSIS — J069 Acute upper respiratory infection, unspecified: Secondary | ICD-10-CM

## 2014-03-26 LAB — LIPID PANEL
Cholesterol: 97 mg/dL (ref 0–200)
HDL: 19 mg/dL — AB (ref >39.00)
NonHDL: 78
TRIGLYCERIDES: 399 mg/dL — AB (ref 0.0–149.0)
Total CHOL/HDL Ratio: 5
VLDL: 79.8 mg/dL — AB (ref 0.0–40.0)

## 2014-03-26 LAB — LDL CHOLESTEROL, DIRECT: Direct LDL: 31.8 mg/dL

## 2014-03-26 MED ORDER — HYDROCOD POLST-CHLORPHEN POLST 10-8 MG/5ML PO LQCR: 5.0000 mL | Freq: Two times a day (BID) | ORAL | Status: DC | PRN

## 2014-03-26 MED ORDER — AMOXICILLIN 875 MG PO TABS: 875.0000 mg | ORAL_TABLET | Freq: Two times a day (BID) | ORAL | Status: DC

## 2014-03-26 NOTE — Progress Notes (Signed)
Patient presents to clinic today c/o productive cough x 5-6 days accompanied by ear pressure and ear pain, PND and sore throat x 2 days.  Denies fever, chills, aches, wheezing, SOB or pleuritic chest pain. Endorses history of recurrent ear infections.  Patient also due for follow-up of elevated triglycerides.  Patient currently on a combination of Tricor and Zocor for elevated Triglycerides.  At last check TGL had reduced from almost 3000 to 350.  Patient has been limiting intake of carbohydrates and refined sugars.  Is taking 14 units of Levemir daily.  Past Medical History  Diagnosis Date  . Pancreatitis, acute   . Diabetes mellitus type II, controlled   . Hyperlipidemia   . Chronic ear infection     Current Outpatient Prescriptions on File Prior to Visit  Medication Sig Dispense Refill  . fenofibrate (TRICOR) 145 MG tablet Take 1 tablet (145 mg total) by mouth daily.  30 tablet  2  . glucose blood test strip Use as instructed  100 each  12  . Insulin Detemir (LEVEMIR) 100 UNIT/ML Pen Inject 12 Units into the skin daily at 10 pm.  15 mL  2  . Insulin Pen Needle (1ST CHOICE PEN NEEDLES) 31G X 6 MM MISC For insulin injections as directed  100 each  0  . Lancets (FREESTYLE) lancets Use as instructed  100 each  12  . lisinopril (PRINIVIL,ZESTRIL) 10 MG tablet Take 0.5 tablets (5 mg total) by mouth daily.  30 tablet  1  . simvastatin (ZOCOR) 20 MG tablet Take 1 tablet (20 mg total) by mouth daily at 6 PM.  30 tablet  0   No current facility-administered medications on file prior to visit.    Allergies  Allergen Reactions  . Novocain [Procaine] Nausea And Vomiting    Family History  Problem Relation Age of Onset  . Heart attack Father 55    Deceased  . Diabetes Father   . Diabetes Paternal Uncle   . Diabetes Mother   . Hypertension Mother     Living  . Alzheimer's disease Maternal Grandfather   . Healthy Brother   . Healthy Daughter     History   Social History  . Marital  Status: Married    Spouse Name: N/A    Number of Children: N/A  . Years of Education: N/A   Social History Main Topics  . Smoking status: Current Every Day Smoker  . Smokeless tobacco: None  . Alcohol Use: No  . Drug Use: No  . Sexual Activity:    Other Topics Concern  . None   Social History Narrative  . None   Review of Systems - See HPI.  All other ROS are negative.  BP 140/98  Pulse 86  Temp(Src) 98.3 F (36.8 C) (Oral)  Ht 5' 6"  (1.676 m)  Wt 216 lb 6.4 oz (98.158 kg)  BMI 34.94 kg/m2  SpO2 98%  LMP 02/25/2014  Physical Exam  Vitals reviewed. Constitutional: She is oriented to person, place, and time and well-developed, well-nourished, and in no distress.  HENT:  Head: Normocephalic and atraumatic.  Right Ear: External ear and ear canal normal. Tympanic membrane is erythematous and bulging.  Left Ear: Tympanic membrane, external ear and ear canal normal.  Nose: Nose normal. Right sinus exhibits no maxillary sinus tenderness and no frontal sinus tenderness. Left sinus exhibits no maxillary sinus tenderness and no frontal sinus tenderness.  Mouth/Throat: Uvula is midline, oropharynx is clear and moist and mucous membranes are  normal.  Eyes: Pupils are equal, round, and reactive to light.  Neck: Neck supple.  Cardiovascular: Normal rate, regular rhythm, normal heart sounds and intact distal pulses.   Pulmonary/Chest: Effort normal and breath sounds normal. No respiratory distress. She has no wheezes. She has no rales. She exhibits no tenderness.  Lymphadenopathy:    She has no cervical adenopathy.  Neurological: She is alert and oriented to person, place, and time.  Skin: Skin is warm and dry. No rash noted.  Psychiatric: Affect normal.    Recent Results (from the past 2160 hour(s))  CBC WITH DIFFERENTIAL     Status: Abnormal   Collection Time    02/05/14  5:00 PM      Result Value Ref Range   WBC 17.2 (*) 4.0 - 10.5 K/uL   RBC 5.04  3.87 - 5.11 MIL/uL    Hemoglobin 15.4 (*) 12.0 - 15.0 g/dL   HCT 42.5  36.0 - 46.0 %   MCV 84.3  78.0 - 100.0 fL   MCH 30.6  26.0 - 34.0 pg   MCHC 36.2 (*) 30.0 - 36.0 g/dL   RDW 16.2 (*) 11.5 - 15.5 %   Platelets 356  150 - 400 K/uL   Neutrophils Relative % 88 (*) 43 - 77 %   Neutro Abs 15.1 (*) 1.7 - 7.7 K/uL   Lymphocytes Relative 6 (*) 12 - 46 %   Lymphs Abs 1.0  0.7 - 4.0 K/uL   Monocytes Relative 6  3 - 12 %   Monocytes Absolute 1.0  0.1 - 1.0 K/uL   Eosinophils Relative 0  0 - 5 %   Eosinophils Absolute 0.0  0.0 - 0.7 K/uL   Basophils Relative 0  0 - 1 %   Basophils Absolute 0.0  0.0 - 0.1 K/uL  BASIC METABOLIC PANEL     Status: Abnormal   Collection Time    02/05/14  5:00 PM      Result Value Ref Range   Sodium 130 (*) 137 - 147 mEq/L   Potassium 4.7  3.7 - 5.3 mEq/L   Chloride 89 (*) 96 - 112 mEq/L   CO2 18 (*) 19 - 32 mEq/L   Glucose, Bld 267 (*) 70 - 99 mg/dL   BUN 11  6 - 23 mg/dL   Creatinine, Ser 0.47 (*) 0.50 - 1.10 mg/dL   Calcium 8.9  8.4 - 10.5 mg/dL   GFR calc non Af Amer >90  >90 mL/min   GFR calc Af Amer >90  >90 mL/min   Comment: (NOTE)     The eGFR has been calculated using the CKD EPI equation.     This calculation has not been validated in all clinical situations.     eGFR's persistently <90 mL/min signify possible Chronic Kidney     Disease.   Anion gap 23 (*) 5 - 15   Comment: Performed at [facility] PANEL     Status: Abnormal   Collection Time    02/05/14  5:00 PM      Result Value Ref Range   Total Protein 7.6  6.0 - 8.3 g/dL   Albumin 3.3 (*) 3.5 - 5.2 g/dL   AST 30  0 - 37 U/L   ALT 18  0 - 35 U/L   Alkaline Phosphatase 116  39 - 117 U/L   Total Bilirubin 0.4  0.3 - 1.2 mg/dL   Bilirubin, Direct <0.2  0.0 - 0.3 mg/dL  Indirect Bilirubin NOT CALCULATED  0.3 - 0.9 mg/dL   Comment: Performed at [place], BLOOD     Status: Abnormal   Collection Time    02/05/14  5:00 PM      Result Value Ref Range   Lipase 680  (*) 11 - 59 U/L   Comment: Performed at [facility] MICROSCOPIC     Status: Abnormal   Collection Time    02/05/14  5:00 PM      Result Value Ref Range   Color, Urine AMBER (*) YELLOW   Comment: BIOCHEMICALS MAY BE AFFECTED BY COLOR   APPearance TURBID (*) CLEAR   Specific Gravity, Urine 1.034 (*) 1.005 - 1.030   pH 5.5  5.0 - 8.0   Glucose, UA 250 (*) NEGATIVE mg/dL   Hgb urine dipstick SMALL (*) NEGATIVE   Bilirubin Urine SMALL (*) NEGATIVE   Ketones, ur 40 (*) NEGATIVE mg/dL   Protein, ur >300 (*) NEGATIVE mg/dL   Urobilinogen, UA 1.0  0.0 - 1.0 mg/dL   Nitrite NEGATIVE  NEGATIVE   Leukocytes, UA NEGATIVE  NEGATIVE  PREGNANCY, URINE     Status: None   Collection Time    02/05/14  5:00 PM      Result Value Ref Range   Preg Test, Ur NEGATIVE  NEGATIVE   Comment:            THE SENSITIVITY OF THIS     METHODOLOGY IS >20 mIU/mL.  URINE MICROSCOPIC-ADD ON     Status: Abnormal   Collection Time    02/05/14  5:00 PM      Result Value Ref Range   Squamous Epithelial / LPF FEW (*) RARE   WBC, UA 0-2  <3 WBC/hpf   RBC / HPF 3-6  <3 RBC/hpf   Bacteria, UA MANY (*) RARE   Urine-Other AMORPHOUS URATES/PHOSPHATES    URINE RAPID DRUG SCREEN (HOSP PERFORMED)     Status: Abnormal   Collection Time    02/06/14  1:39 AM      Result Value Ref Range   Opiates POSITIVE (*) NONE DETECTED   Cocaine NONE DETECTED  NONE DETECTED   Benzodiazepines NONE DETECTED  NONE DETECTED   Amphetamines NONE DETECTED  NONE DETECTED   Tetrahydrocannabinol NONE DETECTED  NONE DETECTED   Barbiturates NONE DETECTED  NONE DETECTED   Comment:            DRUG SCREEN FOR MEDICAL PURPOSES     ONLY.  IF CONFIRMATION IS NEEDED     FOR ANY PURPOSE, NOTIFY LAB     WITHIN 5 DAYS.                LOWEST DETECTABLE LIMITS     FOR URINE DRUG SCREEN     Drug Class       Cutoff (ng/mL)     Amphetamine      1000     Barbiturate      200     Benzodiazepine   948     Tricyclics        546     Opiates          300     Cocaine          300     THC              50  TSH     Status: None   Collection Time  02/06/14  1:40 AM      Result Value Ref Range   TSH 3.460  0.350 - 4.500 uIU/mL  BASIC METABOLIC PANEL     Status: Abnormal   Collection Time    02/06/14  1:40 AM      Result Value Ref Range   Sodium 134 (*) 137 - 147 mEq/L   Potassium 4.3  3.7 - 5.3 mEq/L   Chloride 96  96 - 112 mEq/L   CO2 23  19 - 32 mEq/L   Glucose, Bld 274 (*) 70 - 99 mg/dL   BUN 10  6 - 23 mg/dL   Creatinine, Ser 0.56  0.50 - 1.10 mg/dL   Calcium 8.4  8.4 - 10.5 mg/dL   GFR calc non Af Amer >90  >90 mL/min   GFR calc Af Amer >90  >90 mL/min   Comment: (NOTE)     The eGFR has been calculated using the CKD EPI equation.     This calculation has not been validated in all clinical situations.     eGFR's persistently <90 mL/min signify possible Chronic Kidney     Disease.   Anion gap 15  5 - 15  CBC     Status: Abnormal   Collection Time    02/06/14  1:40 AM      Result Value Ref Range   WBC 14.6 (*) 4.0 - 10.5 K/uL   RBC 4.56  3.87 - 5.11 MIL/uL   Hemoglobin 13.4  12.0 - 15.0 g/dL   HCT 39.2  36.0 - 46.0 %   MCV 86.0  78.0 - 100.0 fL   MCH 29.4  26.0 - 34.0 pg   MCHC 34.2  30.0 - 36.0 g/dL   RDW 16.1 (*) 11.5 - 15.5 %   Platelets 311  150 - 400 K/uL  HEMOGLOBIN A1C     Status: Abnormal   Collection Time    02/06/14  1:40 AM      Result Value Ref Range   Hemoglobin A1C 9.4 (*) <5.7 %   Comment: (NOTE)                                                                               According to the ADA Clinical Practice Recommendations for 2011, when     HbA1c is used as a screening test:      >=6.5%   Diagnostic of Diabetes Mellitus               (if abnormal result is confirmed)     5.7-6.4%   Increased risk of developing Diabetes Mellitus     References:Diagnosis and Classification of Diabetes Mellitus,Diabetes     AUQJ,3354,56(YBWLS 1):S62-S69 and Standards of Medical Care  in             Diabetes - 2011,Diabetes Care,2011,34 (Suppl 1):S11-S61.   Mean Plasma Glucose 223 (*) <117 mg/dL   Comment: Performed at [facility], BLOOD     Status: Abnormal   Collection Time    02/06/14  1:40 AM      Result Value Ref Range   Lipase 299 (*) 11 - 59 U/L  ETHANOL  Status: None   Collection Time    02/06/14  1:40 AM      Result Value Ref Range   Alcohol, Ethyl (B) <11  0 - 11 mg/dL   Comment:            LOWEST DETECTABLE LIMIT FOR     SERUM ALCOHOL IS 11 mg/dL     FOR MEDICAL PURPOSES ONLY  LIPID PANEL     Status: Abnormal   Collection Time    02/06/14  1:40 AM      Result Value Ref Range   Cholesterol 520 (*) 0 - 200 mg/dL   Triglycerides 2826 (*) <150 mg/dL   HDL NOT REPORTED DUE TO HIGH TRIGLYCERIDES  >39 mg/dL   Total CHOL/HDL Ratio NOT REPORTED DUE TO HIGH TRIGLYCERIDES     VLDL UNABLE TO CALCULATE IF TRIGLYCERIDE OVER 400 mg/dL  0 - 40 mg/dL   LDL Cholesterol UNABLE TO CALCULATE IF TRIGLYCERIDE OVER 400 mg/dL  0 - 99 mg/dL  GLUCOSE, CAPILLARY     Status: Abnormal   Collection Time    02/06/14  8:22 AM      Result Value Ref Range   Glucose-Capillary 271 (*) 70 - 99 mg/dL   Comment 1 Notify RN    GLUCOSE, CAPILLARY     Status: Abnormal   Collection Time    02/06/14 12:36 PM      Result Value Ref Range   Glucose-Capillary 233 (*) 70 - 99 mg/dL   Comment 1 Notify RN    GLUCOSE, CAPILLARY     Status: Abnormal   Collection Time    02/06/14  5:12 PM      Result Value Ref Range   Glucose-Capillary 222 (*) 70 - 99 mg/dL   Comment 1 Notify RN    GLUCOSE, CAPILLARY     Status: Abnormal   Collection Time    02/06/14  9:47 PM      Result Value Ref Range   Glucose-Capillary 219 (*) 70 - 99 mg/dL  GLUCOSE, CAPILLARY     Status: Abnormal   Collection Time    02/07/14  7:41 AM      Result Value Ref Range   Glucose-Capillary 172 (*) 70 - 99 mg/dL  LIPASE, BLOOD     Status: None   Collection Time    02/07/14 11:10 AM      Result Value  Ref Range   Lipase 50  11 - 59 U/L  GLUCOSE, CAPILLARY     Status: Abnormal   Collection Time    02/07/14 11:55 AM      Result Value Ref Range   Glucose-Capillary 179 (*) 70 - 99 mg/dL  GLUCOSE, CAPILLARY     Status: Abnormal   Collection Time    02/07/14  5:08 PM      Result Value Ref Range   Glucose-Capillary 162 (*) 70 - 99 mg/dL  GLUCOSE, CAPILLARY     Status: Abnormal   Collection Time    02/07/14 10:24 PM      Result Value Ref Range   Glucose-Capillary 182 (*) 70 - 99 mg/dL  CBC     Status: Abnormal   Collection Time    02/08/14  6:50 AM      Result Value Ref Range   WBC 11.8 (*) 4.0 - 10.5 K/uL   RBC 4.06  3.87 - 5.11 MIL/uL   Hemoglobin 11.9 (*) 12.0 - 15.0 g/dL   HCT 36.1  36.0 - 46.0 %  MCV 88.9  78.0 - 100.0 fL   MCH 29.3  26.0 - 34.0 pg   MCHC 33.0  30.0 - 36.0 g/dL   RDW 16.3 (*) 11.5 - 15.5 %   Platelets 284  150 - 400 K/uL  BASIC METABOLIC PANEL     Status: Abnormal   Collection Time    02/08/14  6:50 AM      Result Value Ref Range   Sodium 137  137 - 147 mEq/L   Potassium 3.5 (*) 3.7 - 5.3 mEq/L   Chloride 99  96 - 112 mEq/L   CO2 25  19 - 32 mEq/L   Glucose, Bld 145 (*) 70 - 99 mg/dL   BUN 7  6 - 23 mg/dL   Creatinine, Ser 0.53  0.50 - 1.10 mg/dL   Calcium 8.5  8.4 - 10.5 mg/dL   GFR calc non Af Amer >90  >90 mL/min   GFR calc Af Amer >90  >90 mL/min   Comment: (NOTE)     The eGFR has been calculated using the CKD EPI equation.     This calculation has not been validated in all clinical situations.     eGFR's persistently <90 mL/min signify possible Chronic Kidney     Disease.   Anion gap 13  5 - 15  LIPASE, BLOOD     Status: None   Collection Time    02/08/14  6:50 AM      Result Value Ref Range   Lipase 50  11 - 59 U/L  GLUCOSE, CAPILLARY     Status: Abnormal   Collection Time    02/08/14  7:52 AM      Result Value Ref Range   Glucose-Capillary 175 (*) 70 - 99 mg/dL  GLUCOSE, CAPILLARY     Status: Abnormal   Collection Time    02/08/14  11:46 AM      Result Value Ref Range   Glucose-Capillary 170 (*) 70 - 99 mg/dL  LIPID PANEL     Status: Abnormal   Collection Time    03/04/14  8:49 AM      Result Value Ref Range   Cholesterol 103  0 - 200 mg/dL   Comment: ATP III Classification:           < 200        mg/dL        Desirable          200 - 239     mg/dL        Borderline High          >= 240        mg/dL        High         Triglycerides 351 (*) <150 mg/dL   HDL 26 (*) >39 mg/dL   Total CHOL/HDL Ratio 4.0     VLDL 70 (*) 0 - 40 mg/dL   LDL Cholesterol 7  0 - 99 mg/dL   Comment:       Total Cholesterol/HDL Ratio:CHD Risk                            Coronary Heart Disease Risk Table  Men       Women              1/2 Average Risk              3.4        3.3                  Average Risk              5.0        4.4               2X Average Risk              9.6        7.1               3X Average Risk             23.4       11.0     Use the calculated Patient Ratio above and the CHD Risk table      to determine the patient's CHD Risk.     ATP III Classification (LDL):           < 100        mg/dL         Optimal          100 - 129     mg/dL         Near or Above Optimal          130 - 159     mg/dL         Borderline High          160 - 189     mg/dL         High           > 190        mg/dL         Very High        LDL CHOLESTEROL, DIRECT     Status: None   Collection Time    03/04/14  8:49 AM      Result Value Ref Range   Direct LDL 38     Comment: ATP III Classification (LDL):           < 100        mg/dL         Optimal          100 - 129     mg/dL         Near or Above Optimal          130 - 159     mg/dL         Borderline High          160 - 189     mg/dL         High           > 190        mg/dL         Very High        URINALYSIS, MICROSCOPIC ONLY     Status: None   Collection Time    03/04/14  8:49 AM      Result Value Ref Range   Squamous Epithelial /  LPF FEW  RARE   Crystals NONE SEEN  NONE SEEN   Casts NONE SEEN  NONE SEEN   WBC, UA 0-2  <3 WBC/hpf  RBC / HPF 0-2  <3 RBC/hpf   Bacteria, UA RARE  RARE  MICROALBUMIN / CREATININE URINE RATIO     Status: Abnormal   Collection Time    03/04/14  8:49 AM      Result Value Ref Range   Microalb, Ur 4.92 (*) 0.00 - 1.89 mg/dL   Creatinine, Urine 110.2     Microalb Creat Ratio 44.6 (*) 0.0 - 30.0 mg/g  COMPLETE METABOLIC PANEL WITH GFR     Status: Abnormal   Collection Time    03/04/14  8:49 AM      Result Value Ref Range   Sodium 139  135 - 145 mEq/L   Potassium 4.5  3.5 - 5.3 mEq/L   Chloride 105  96 - 112 mEq/L   CO2 25  19 - 32 mEq/L   Glucose, Bld 164 (*) 70 - 99 mg/dL   BUN 11  6 - 23 mg/dL   Creat 0.72  0.50 - 1.10 mg/dL   Total Bilirubin 0.4  0.2 - 1.2 mg/dL   Alkaline Phosphatase 63  39 - 117 U/L   AST 18  0 - 37 U/L   ALT 20  0 - 35 U/L   Total Protein 6.8  6.0 - 8.3 g/dL   Albumin 4.2  3.5 - 5.2 g/dL   Calcium 9.1  8.4 - 10.5 mg/dL   GFR, Est African American >89     GFR, Est Non African American >89     Comment:       The estimated GFR is a calculation valid for adults (>=95 years old)     that uses the CKD-EPI algorithm to adjust for age and sex. It is       not to be used for children, pregnant women, hospitalized patients,        patients on dialysis, or with rapidly changing kidney function.     According to the NKDEP, eGFR >89 is normal, 60-89 shows mild     impairment, 30-59 shows moderate impairment, 15-29 shows severe     impairment and <15 is ESRD.          Assessment/Plan: Recurrent acute serous otitis media of right ear Rx Amoxicillin. Follow supportive measures as directed for viral bronchitis.  Viral URI with cough Still in time frame where suspect viral etiology of symptoms.  Increase fluid intake.  Rest.  Saline nasal spray. Probiotic. Plain Mucinex. Place humidifier in bedroom.  Rx Tussionex for cough.  Patient given Rx Amoxicillin for bacterial  AOM of R ear.  Hypertriglyceridemia Will repeat fasting lipid panel today.

## 2014-03-26 NOTE — Assessment & Plan Note (Signed)
Will repeat fasting lipid panel today. 

## 2014-03-26 NOTE — Progress Notes (Signed)
Pre visit review using our clinic review tool, if applicable. No additional management support is needed unless otherwise documented below in the visit note. 

## 2014-03-26 NOTE — Patient Instructions (Addendum)
Increase your fluid intake.  Take antibiotic as directed. Use Tussionex for cough.  Take plain Mucinex to help with congestion. Place a humidifier in your bedroom.  Otitis Media Otitis media is redness, soreness, and puffiness (swelling) in the space just behind your eardrum (middle ear). It may be caused by allergies or infection. It often happens along with a cold. HOME CARE  Take your medicine as told. Finish it even if you start to feel better.  Only take over-the-counter or prescription medicines for pain, discomfort, or fever as told by your doctor.  Follow up with your doctor as told. GET HELP IF:  You have otitis media only in one ear, or bleeding from your nose, or both.  You notice a lump on your neck.  You are not getting better in 3-5 days.  You feel worse instead of better. GET HELP RIGHT AWAY IF:   You have pain that is not helped with medicine.  You have puffiness, redness, or pain around your ear.  You get a stiff neck.  You cannot move part of your face (paralysis).  You notice that the bone behind your ear hurts when you touch it. MAKE SURE YOU:   Understand these instructions.  Will watch your condition.  Will get help right away if you are not doing well or get worse. Document Released: 12/19/2007 Document Revised: 07/07/2013 Document Reviewed: 01/27/2013 [facility] Patient Information 2015 [place], [place]. This information is not intended to replace advice given to you by your health care provider. Make sure you discuss any questions you have with your health care provider.  Viral Infections A virus is a type of germ. Viruses can cause:  Minor sore throats.  Aches and pains.  Headaches.  Runny nose.  Rashes.  Watery eyes.  Tiredness.  Coughs.  Loss of appetite.  Feeling sick to your stomach (nausea).  Throwing up (vomiting).  Watery poop (diarrhea). HOME CARE   Only take medicines as told by your doctor.  Drink enough water and  fluids to keep your pee (urine) clear or pale yellow. Sports drinks are a good choice.  Get plenty of rest and eat healthy. Soups and broths with crackers or rice are fine. GET HELP RIGHT AWAY IF:   You have a very bad headache.  You have shortness of breath.  You have chest pain or neck pain.  You have an unusual rash.  You cannot stop throwing up.  You have watery poop that does not stop.  You cannot keep fluids down.  You or your child has a temperature by mouth above 102 F (38.9 C), not controlled by medicine.  Your baby is older than 3 months with a rectal temperature of 102 F (38.9 C) or higher.  Your baby is 29 months old or younger with a rectal temperature of 100.4 F (38 C) or higher. MAKE SURE YOU:   Understand these instructions.  Will watch this condition.  Will get help right away if you are not doing well or get worse. Document Released: 06/14/2008 Document Revised: 09/24/2011 Document Reviewed: 11/07/2010 [facility] Patient Information 2015 [place], [place]. This information is not intended to replace advice given to you by your health care provider. Make sure you discuss any questions you have with your health care provider.

## 2014-03-26 NOTE — Assessment & Plan Note (Signed)
Still in time frame where suspect viral etiology of symptoms.  Increase fluid intake.  Rest.  Saline nasal spray. Probiotic. Plain Mucinex. Place humidifier in bedroom.  Rx Tussionex for cough.  Patient given Rx Amoxicillin for bacterial AOM of R ear.

## 2014-03-26 NOTE — Assessment & Plan Note (Signed)
Rx Amoxicillin. Follow supportive measures as directed for viral bronchitis.

## 2014-03-26 NOTE — Addendum Note (Signed)
Addended by: Harl Bowie on: 03/26/2014 10:16 AM   Modules accepted: Orders

## 2014-03-29 ENCOUNTER — Telehealth: Payer: Self-pay | Admitting: *Deleted

## 2014-03-29 DIAGNOSIS — E785 Hyperlipidemia, unspecified: Secondary | ICD-10-CM

## 2014-03-29 DIAGNOSIS — E781 Pure hyperglyceridemia: Secondary | ICD-10-CM

## 2014-03-29 NOTE — Telephone Encounter (Signed)
Patient informed, understood & agreed; new lab orders placed/SLS  

## 2014-03-29 NOTE — Telephone Encounter (Signed)
Message copied by Rockwell Germany on Mon Mar 29, 2014  1:56 PM ------      Message from: [name]      Created: Mon Mar 29, 2014  7:36 AM       Triglycerides have increased since last check, possibly due to patient fighting off a viral infection.  Again she needs to limit intake of refined sugars and carbs.  Continue the Tricor 145 mcg daily.  Will increase the Zocor to 40 mg daily over the next month. (Please send in Rx)  Then patient to return to lab in 1 month to recheck triglycerides. ------

## 2014-04-02 ENCOUNTER — Ambulatory Visit: Payer: BC Managed Care – PPO | Admitting: Physician Assistant

## 2014-04-08 ENCOUNTER — Other Ambulatory Visit: Payer: Self-pay | Admitting: Physician Assistant

## 2014-04-09 ENCOUNTER — Telehealth: Payer: Self-pay | Admitting: Physician Assistant

## 2014-04-09 ENCOUNTER — Other Ambulatory Visit: Payer: Self-pay | Admitting: Physician Assistant

## 2014-04-09 DIAGNOSIS — J069 Acute upper respiratory infection, unspecified: Secondary | ICD-10-CM

## 2014-04-09 DIAGNOSIS — B9789 Other viral agents as the cause of diseases classified elsewhere: Principal | ICD-10-CM

## 2014-04-09 MED ORDER — SIMVASTATIN 40 MG PO TABS: ORAL_TABLET | ORAL | Status: DC

## 2014-04-09 MED ORDER — HYDROCOD POLST-CHLORPHEN POLST 10-8 MG/5ML PO LQCR: 5.0000 mL | Freq: Two times a day (BID) | ORAL | Status: DC | PRN

## 2014-04-09 NOTE — Telephone Encounter (Signed)
Caller name: [facility] Relation to pt: Call back number: [phone] Pharmacy:  Reason for call:    Please call DeAnn at [facility]. She needs clarification on the simvastatin rx that was called in.

## 2014-04-09 NOTE — Telephone Encounter (Signed)
Spoke with pharmacy and verified that correct Simvastatin dosage is 40 mg/SLS

## 2014-05-10 ENCOUNTER — Encounter: Payer: Self-pay | Admitting: Physician Assistant

## 2014-05-10 ENCOUNTER — Telehealth: Payer: Self-pay | Admitting: Physician Assistant

## 2014-05-10 ENCOUNTER — Ambulatory Visit (INDEPENDENT_AMBULATORY_CARE_PROVIDER_SITE_OTHER): Payer: Self-pay | Admitting: Physician Assistant

## 2014-05-10 VITALS — BP 119/72 | HR 87 | Temp 98.4°F | Resp 16 | Ht 66.0 in | Wt 217.5 lb

## 2014-05-10 DIAGNOSIS — IMO0002 Reserved for concepts with insufficient information to code with codable children: Secondary | ICD-10-CM

## 2014-05-10 DIAGNOSIS — Z23 Encounter for immunization: Secondary | ICD-10-CM

## 2014-05-10 DIAGNOSIS — E781 Pure hyperglyceridemia: Secondary | ICD-10-CM

## 2014-05-10 DIAGNOSIS — E1165 Type 2 diabetes mellitus with hyperglycemia: Secondary | ICD-10-CM

## 2014-05-10 DIAGNOSIS — E785 Hyperlipidemia, unspecified: Secondary | ICD-10-CM

## 2014-05-10 DIAGNOSIS — E669 Obesity, unspecified: Secondary | ICD-10-CM

## 2014-05-10 DIAGNOSIS — E1169 Type 2 diabetes mellitus with other specified complication: Secondary | ICD-10-CM | POA: Insufficient documentation

## 2014-05-10 LAB — COMPREHENSIVE METABOLIC PANEL
ALT: 17 U/L (ref 0–35)
AST: 19 U/L (ref 0–37)
Albumin: 3.4 g/dL — ABNORMAL LOW (ref 3.5–5.2)
Alkaline Phosphatase: 69 U/L (ref 39–117)
BILIRUBIN TOTAL: 0.3 mg/dL (ref 0.2–1.2)
BUN: 12 mg/dL (ref 6–23)
CALCIUM: 9.1 mg/dL (ref 8.4–10.5)
CO2: 21 meq/L (ref 19–32)
CREATININE: 0.8 mg/dL (ref 0.4–1.2)
Chloride: 104 mEq/L (ref 96–112)
GFR: 80.26 mL/min (ref >60.00)
GLUCOSE: 160 mg/dL — AB (ref 70–99)
Potassium: 4.4 mEq/L (ref 3.5–5.1)
Sodium: 137 mEq/L (ref 135–145)
Total Protein: 7.6 g/dL (ref 6.0–8.3)

## 2014-05-10 LAB — LIPID PANEL
Cholesterol: 116 mg/dL (ref 0–200)
HDL: 25.3 mg/dL — AB (ref >39.00)
NonHDL: 90.7
TRIGLYCERIDES: 355 mg/dL — AB (ref 0.0–149.0)
Total CHOL/HDL Ratio: 5
VLDL: 71 mg/dL — ABNORMAL HIGH (ref 0.0–40.0)

## 2014-05-10 LAB — HEMOGLOBIN A1C: Hgb A1c MFr Bld: 7.6 % — ABNORMAL HIGH (ref 4.6–6.5)

## 2014-05-10 LAB — LDL CHOLESTEROL, DIRECT: LDL DIRECT: 46.3 mg/dL

## 2014-05-10 MED ORDER — PHENTERMINE HCL 30 MG PO CAPS: 30.0000 mg | ORAL_CAPSULE | ORAL | Status: DC

## 2014-05-10 MED ORDER — SIMVASTATIN 40 MG PO TABS: ORAL_TABLET | ORAL | Status: DC

## 2014-05-10 MED ORDER — FENOFIBRATE 160 MG PO TABS: 160.0000 mg | ORAL_TABLET | Freq: Every day | ORAL | Status: DC

## 2014-05-10 MED ORDER — METFORMIN HCL 500 MG PO TABS
500.0000 mg | ORAL_TABLET | Freq: Every day | ORAL | Status: DC
Start: 2014-05-10 — End: 2014-07-21

## 2014-05-10 MED ORDER — LISINOPRIL 10 MG PO TABS: 10.0000 mg | ORAL_TABLET | Freq: Every day | ORAL | Status: DC

## 2014-05-10 NOTE — Assessment & Plan Note (Signed)
Will begin one month trial of Phentermine. ADRs discussed with patient. Continue diet and exercise.  Follow-up in 1 month.

## 2014-05-10 NOTE — Patient Instructions (Signed)
Please continue medications as directed.  Monitor fasting blood sugar each morning and record.  Bring to follow-up visits.  For weight loss, continue your diet and exercise.  Read information below about diabetes and exercise.  Take phentermine as directed. Follow-up with me in 1 month.  IF you notice racing heart while on phentermine, stop the medication and call the office.  Diabetes and Exercise Exercising regularly is important. It is not just about losing weight. It has many health benefits, such as:  Improving your overall fitness, flexibility, and endurance.  Increasing your bone density.  Helping with weight control.  Decreasing your body fat.  Increasing your muscle strength.  Reducing stress and tension.  Improving your overall health. People with diabetes who exercise gain additional benefits because exercise:  Reduces appetite.  Improves the body's use of blood sugar (glucose).  Helps lower or control blood glucose.  Decreases blood pressure.  Helps control blood lipids (such as cholesterol and triglycerides).  Improves the body's use of the hormone insulin by:  Increasing the body's insulin sensitivity.  Reducing the body's insulin needs.  Decreases the risk for heart disease because exercising:  Lowers cholesterol and triglycerides levels.  Increases the levels of good cholesterol (such as high-density lipoproteins [HDL]) in the body.  Lowers blood glucose levels. YOUR ACTIVITY PLAN  Choose an activity that you enjoy and set realistic goals. Your health care provider or diabetes educator can help you make an activity plan that works for you. Exercise regularly as directed by your health care provider. This includes:  Performing resistance training twice a week such as push-ups, sit-ups, lifting weights, or using resistance bands.  Performing 150 minutes of cardio exercises each week such as walking, running, or playing sports.  Staying active and  spending no more than 90 minutes at one time being inactive. Even short bursts of exercise are good for you. Three 10-minute sessions spread throughout the day are just as beneficial as a single 30-minute session. Some exercise ideas include:  Taking the dog for a walk.  Taking the stairs instead of the elevator.  Dancing to your favorite song.  Doing an exercise video.  Doing your favorite exercise with a friend. RECOMMENDATIONS FOR EXERCISING WITH TYPE 1 OR TYPE 2 DIABETES   Check your blood glucose before exercising. If blood glucose levels are greater than 240 mg/dL, check for urine ketones. Do not exercise if ketones are present.  Avoid injecting insulin into areas of the body that are going to be exercised. For example, avoid injecting insulin into:  The arms when playing tennis.  The legs when jogging.  Keep a record of:  Food intake before and after you exercise.  Expected peak times of insulin action.  Blood glucose levels before and after you exercise.  The type and amount of exercise you have done.  Review your records with your health care provider. Your health care provider will help you to develop guidelines for adjusting food intake and insulin amounts before and after exercising.  If you take insulin or oral hypoglycemic agents, watch for signs and symptoms of hypoglycemia. They include:  Dizziness.  Shaking.  Sweating.  Chills.  Confusion.  Drink plenty of water while you exercise to prevent dehydration or heat stroke. Body water is lost during exercise and must be replaced.  Talk to your health care provider before starting an exercise program to make sure it is safe for you. Remember, almost any type of activity is better than none. Document  Released: 09/22/2003 Document Revised: 11/16/2013 Document Reviewed: 12/09/2012 [facility] Patient Information 2015 [place], [place]. This information is not intended to replace advice given to you by your health  care provider. Make sure you discuss any questions you have with your health care provider.

## 2014-05-10 NOTE — Progress Notes (Signed)
Pre visit review using our clinic review tool, if applicable. No additional management support is needed unless otherwise documented below in the visit note/SLS  

## 2014-05-10 NOTE — Assessment & Plan Note (Signed)
Will obtain repeat lipid panel today.

## 2014-05-10 NOTE — Progress Notes (Signed)
Patient presents to clinic today for follow-up of hypertriglyceridemia after increase of Zocor to 40 mg daily. Patient continues to take Zocor and Fenofibrate as directed.  Endorses fasting blood glucose levels each morning around 130.  Has been going to the gym and trying to eat better. Patient is due for A1C.    Patient wishes to discuss phentermine for weight loss. Body mass index is 35.12 kg/(m^2). Patient endorses being on medication several years ago with good results.  BP well controlled currently.   Past Medical History  Diagnosis Date  . Pancreatitis, acute   . Diabetes mellitus type II, controlled   . Hyperlipidemia   . Chronic ear infection     Current Outpatient Prescriptions on File Prior to Visit  Medication Sig Dispense Refill  . fenofibrate (TRICOR) 145 MG tablet Take 1 tablet (145 mg total) by mouth daily.  30 tablet  2  . glucose blood test strip Use as instructed  100 each  12  . Insulin Detemir (LEVEMIR) 100 UNIT/ML Pen Inject 12 Units into the skin daily at 10 pm.  15 mL  2  . Insulin Pen Needle (1ST CHOICE PEN NEEDLES) 31G X 6 MM MISC For insulin injections as directed  100 each  0  . Lancets (FREESTYLE) lancets Use as instructed  100 each  12   No current facility-administered medications on file prior to visit.    Allergies  Allergen Reactions  . Novocain [Procaine] Nausea And Vomiting    Family History  Problem Relation Age of Onset  . Heart attack Father 50    Deceased  . Diabetes Father   . Diabetes Paternal Uncle   . Diabetes Mother   . Hypertension Mother     Living  . Alzheimer's disease Maternal Grandfather   . Healthy Brother   . Healthy Daughter     History   Social History  . Marital Status: Married    Spouse Name: N/A    Number of Children: N/A  . Years of Education: N/A   Social History Main Topics  . Smoking status: Current Every Day Smoker  . Smokeless tobacco: None  . Alcohol Use: No  . Drug Use: No  . Sexual  Activity:    Other Topics Concern  . None   Social History Narrative  . None    Review of Systems - See HPI.  All other ROS are negative.  BP 119/72  Pulse 87  Temp(Src) 98.4 F (36.9 C) (Oral)  Resp 16  Ht 5' 6"  (1.676 m)  Wt 217 lb 8 oz (98.657 kg)  BMI 35.12 kg/m2  SpO2 100%  LMP 04/27/2014  Physical Exam  Constitutional: She is oriented to person, place, and time and well-developed, well-nourished, and in no distress.  HENT:  Head: Normocephalic and atraumatic.  Eyes: Conjunctivae are normal.  Cardiovascular: Normal rate, regular rhythm, normal heart sounds and intact distal pulses.   Pulmonary/Chest: Effort normal and breath sounds normal. No respiratory distress. She has no wheezes. She has no rales. She exhibits no tenderness.  Neurological: She is alert and oriented to person, place, and time.  Skin: Skin is warm and dry. No rash noted.  Psychiatric: Affect normal.    Recent Results (from the past 2160 hour(s))  LIPID PANEL     Status: Abnormal   Collection Time    03/04/14  8:49 AM      Result Value Ref Range   Cholesterol 103  0 - 200 mg/dL  Comment: ATP III Classification:           < 200        mg/dL        Desirable          200 - 239     mg/dL        Borderline High          >= 240        mg/dL        High         Triglycerides 351 (*) <150 mg/dL   HDL 26 (*) >39 mg/dL   Total CHOL/HDL Ratio 4.0     VLDL 70 (*) 0 - 40 mg/dL   LDL Cholesterol 7  0 - 99 mg/dL   Comment:       Total Cholesterol/HDL Ratio:CHD Risk                            Coronary Heart Disease Risk Table                                            Men       Women              1/2 Average Risk              3.4        3.3                  Average Risk              5.0        4.4               2X Average Risk              9.6        7.1               3X Average Risk             23.4       11.0     Use the calculated Patient Ratio above and the CHD Risk table      to determine the  patient's CHD Risk.     ATP III Classification (LDL):           < 100        mg/dL         Optimal          100 - 129     mg/dL         Near or Above Optimal          130 - 159     mg/dL         Borderline High          160 - 189     mg/dL         High           > 190        mg/dL         Very High        LDL CHOLESTEROL, DIRECT     Status: None   Collection Time    03/04/14  8:49 AM      Result Value Ref Range   Direct LDL 38  Comment: ATP III Classification (LDL):           < 100        mg/dL         Optimal          100 - 129     mg/dL         Near or Above Optimal          130 - 159     mg/dL         Borderline High          160 - 189     mg/dL         High           > 190        mg/dL         Very High        URINALYSIS, MICROSCOPIC ONLY     Status: None   Collection Time    03/04/14  8:49 AM      Result Value Ref Range   Squamous Epithelial / LPF FEW  RARE   Crystals NONE SEEN  NONE SEEN   Casts NONE SEEN  NONE SEEN   WBC, UA 0-2  <3 WBC/hpf   RBC / HPF 0-2  <3 RBC/hpf   Bacteria, UA RARE  RARE  MICROALBUMIN / CREATININE URINE RATIO     Status: Abnormal   Collection Time    03/04/14  8:49 AM      Result Value Ref Range   Microalb, Ur 4.92 (*) 0.00 - 1.89 mg/dL   Creatinine, Urine 110.2     Microalb Creat Ratio 44.6 (*) 0.0 - 30.0 mg/g  COMPLETE METABOLIC PANEL WITH GFR     Status: Abnormal   Collection Time    03/04/14  8:49 AM      Result Value Ref Range   Sodium 139  135 - 145 mEq/L   Potassium 4.5  3.5 - 5.3 mEq/L   Chloride 105  96 - 112 mEq/L   CO2 25  19 - 32 mEq/L   Glucose, Bld 164 (*) 70 - 99 mg/dL   BUN 11  6 - 23 mg/dL   Creat 0.72  0.50 - 1.10 mg/dL   Total Bilirubin 0.4  0.2 - 1.2 mg/dL   Alkaline Phosphatase 63  39 - 117 U/L   AST 18  0 - 37 U/L   ALT 20  0 - 35 U/L   Total Protein 6.8  6.0 - 8.3 g/dL   Albumin 4.2  3.5 - 5.2 g/dL   Calcium 9.1  8.4 - 10.5 mg/dL   GFR, Est African American >89     GFR, Est Non African American >89      Comment:       The estimated GFR is a calculation valid for adults (>=68 years old)     that uses the CKD-EPI algorithm to adjust for age and sex. It is       not to be used for children, pregnant women, hospitalized patients,        patients on dialysis, or with rapidly changing kidney function.     According to the NKDEP, eGFR >89 is normal, 60-89 shows mild     impairment, 30-59 shows moderate impairment, 15-29 shows severe     impairment and <15 is ESRD.        LIPID PANEL     Status: Abnormal   Collection Time  03/26/14 10:16 AM      Result Value Ref Range   Cholesterol 97  0 - 200 mg/dL   Comment: ATP III Classification       Desirable:  < 200 mg/dL               Borderline High:  200 - 239 mg/dL          High:  > = 240 mg/dL   Triglycerides 399.0 (*) 0.0 - 149.0 mg/dL   Comment: Normal:  <150 mg/dLBorderline High:  150 - 199 mg/dL   HDL 19.00 (*) >39.00 mg/dL   VLDL 79.8 (*) 0.0 - 40.0 mg/dL   Total CHOL/HDL Ratio 5     Comment:                Men          Women1/2 Average Risk     3.4          3.3Average Risk          5.0          4.42X Average Risk          9.6          7.13X Average Risk          15.0          11.0                       NonHDL 78.00     Comment: NOTE:  Non-HDL goal should be 30 mg/dL higher than patient's LDL goal (i.e. LDL goal of < 70 mg/dL, would have non-HDL goal of < 100 mg/dL)  LDL CHOLESTEROL, DIRECT     Status: None   Collection Time    03/26/14 10:16 AM      Result Value Ref Range   Direct LDL 31.8     Comment: Optimal:  <100 mg/dLNear or Above Optimal:  100-129 mg/dLBorderline High:  130-159 mg/dLHigh:  160-189 mg/dLVery High:  >190 mg/dL    Assessment/Plan: Hyperlipidemia LDL goal <100 Will obtain repeat lipid panel today.    Diabetes mellitus type II, uncontrolled Patient taking medications as directed with good endorses fasting glucose.  Will check CMP and A1C today. Continue ACEI/ARB therapy. Will tailor regimen based on  results.  Obesity Will begin one month trial of Phentermine. ADRs discussed with patient. Continue diet and exercise.  Follow-up in 1 month.

## 2014-05-10 NOTE — Assessment & Plan Note (Signed)
Patient taking medications as directed with good endorses fasting glucose.  Will check CMP and A1C today. Continue ACEI/ARB therapy. Will tailor regimen based on results.

## 2014-05-10 NOTE — Telephone Encounter (Signed)
A1C has reduced to 7.6 down from 9.4 which is great. I would like to start her on Metformin 500 mg daily to help lower A1C even further.  This will also help [place] start the process of switching from insulin to oral medications.  This may take some trial and error, to find the best oral medication regimen. Continue Levemir nightly and check morning fasting glucose daily.  Goal fasting glucose is 80-120.  If she begins getting numbers lower than this, I would like her to reduce Levemir by 2 units every couple of nights until sugar at goal. Also her Triglycerides remain elevated.  She is to continue the Zocor as directed. I am increasing her Fenofibrate to 160 mg daily. Follow-up in 1 month.

## 2014-05-11 NOTE — Telephone Encounter (Signed)
Patient informed, understood & agreed/SLS  

## 2014-05-12 DIAGNOSIS — Z23 Encounter for immunization: Secondary | ICD-10-CM

## 2014-05-14 ENCOUNTER — Telehealth: Payer: Self-pay | Admitting: Physician Assistant

## 2014-05-14 ENCOUNTER — Other Ambulatory Visit: Payer: Self-pay | Admitting: Physician Assistant

## 2014-05-14 DIAGNOSIS — T50905A Adverse effect of unspecified drugs, medicaments and biological substances, initial encounter: Secondary | ICD-10-CM

## 2014-05-14 MED ORDER — METHYLPREDNISOLONE (PAK) 4 MG PO TABS: ORAL_TABLET | ORAL | Status: DC

## 2014-05-14 NOTE — Telephone Encounter (Signed)
Caller name: Debbi, [place] Relation to pt: self  Call back number: [phone]  Reason for call:   Rash all over back and on arms pt started new medication you prescribed this morning and outbreak started few hours ago

## 2014-05-16 NOTE — Telephone Encounter (Signed)
Spoke with patient in clinic as she walked in at end-of-day.  Triaged patient personally as RN was with another patient. No dyspnea, throat swelling.  Only widespread erythematous rash that is pruritic, now with scattered urticaria.  Patient instructed to discontinue metformin and phentermine. Rx Medrol dose pack.  Claritin in AM, Benadryl at PM over the next 2 days.  Sarna lotion and cool compresses for itch.  Follow-up next week. Alarm signs/symptoms discussed with patient.  Patient aware of when to proceed to ER if indicated. Patient agrees to follow instructions.  Will follow-up next week.

## 2014-06-07 ENCOUNTER — Ambulatory Visit: Payer: Self-pay | Admitting: Physician Assistant

## 2014-07-06 ENCOUNTER — Encounter: Payer: Self-pay | Admitting: Physician Assistant

## 2014-07-06 ENCOUNTER — Ambulatory Visit (INDEPENDENT_AMBULATORY_CARE_PROVIDER_SITE_OTHER): Payer: Self-pay | Admitting: Physician Assistant

## 2014-07-06 VITALS — BP 124/71 | HR 82 | Temp 98.0°F | Resp 16 | Ht 66.0 in | Wt 219.0 lb

## 2014-07-06 DIAGNOSIS — B351 Tinea unguium: Secondary | ICD-10-CM | POA: Insufficient documentation

## 2014-07-06 DIAGNOSIS — E781 Pure hyperglyceridemia: Secondary | ICD-10-CM

## 2014-07-06 DIAGNOSIS — IMO0002 Reserved for concepts with insufficient information to code with codable children: Secondary | ICD-10-CM

## 2014-07-06 DIAGNOSIS — E1165 Type 2 diabetes mellitus with hyperglycemia: Secondary | ICD-10-CM

## 2014-07-06 LAB — LIPID PANEL
Cholesterol: 112 mg/dL (ref 0–200)
HDL: 22.3 mg/dL — AB (ref >39.00)
NONHDL: 89.7
Total CHOL/HDL Ratio: 5
Triglycerides: 372 mg/dL — ABNORMAL HIGH (ref 0.0–149.0)
VLDL: 74.4 mg/dL — ABNORMAL HIGH (ref 0.0–40.0)

## 2014-07-06 LAB — LDL CHOLESTEROL, DIRECT: Direct LDL: 38.8 mg/dL

## 2014-07-06 MED ORDER — TERBINAFINE HCL 250 MG PO TABS: 250.0000 mg | ORAL_TABLET | Freq: Every day | ORAL | Status: DC

## 2014-07-06 NOTE — Progress Notes (Signed)
Patient presents to clinic today for follow-up of Diabetes Mellitus II, uncomplicated and Obesity.  At last visit an attempt to start Phentermine for weight loss was made.  Patient had reaction to either Phentermine or newly-started metformin that resulted in a whole body rash.    Patient discontinued both medications.  Has continued diet and exercise. Wishes to discuss other options. In terms of her diabetes, patient states she restarted the Metformin without any side effect.  Endorses fasting glucose around 140-150.   Patient does endorse thickened and yellowing of her toenails bilaterally. Has history of onychomycosis.    Past Medical History  Diagnosis Date  . Pancreatitis, acute   . Diabetes mellitus type II, controlled   . Hyperlipidemia   . Chronic ear infection     Current Outpatient Prescriptions on File Prior to Visit  Medication Sig Dispense Refill  . fenofibrate 160 MG tablet Take 1 tablet (160 mg total) by mouth daily. 30 tablet 2  . glucose blood test strip Use as instructed 100 each 12  . Insulin Detemir (LEVEMIR) 100 UNIT/ML Pen Inject 12 Units into the skin daily at 10 pm. 15 mL 2  . Insulin Pen Needle (1ST CHOICE PEN NEEDLES) 31G X 6 MM MISC For insulin injections as directed 100 each 0  . Lancets (FREESTYLE) lancets Use as instructed 100 each 12  . lisinopril (PRINIVIL,ZESTRIL) 10 MG tablet Take 1 tablet (10 mg total) by mouth daily. 30 tablet 1  . metFORMIN (GLUCOPHAGE) 500 MG tablet Take 1 tablet (500 mg total) by mouth daily with breakfast. 30 tablet 1  . methylPREDNIsolone (MEDROL DOSPACK) 4 MG tablet follow package directions 21 tablet 0  . phentermine 30 MG capsule Take 1 capsule (30 mg total) by mouth every morning. 30 capsule 1  . simvastatin (ZOCOR) 40 MG tablet TAKE ONE TABLET BY MOUTH ONCE DAILY AT  6PM 90 tablet 1   No current facility-administered medications on file prior to visit.    Allergies  Allergen Reactions  . Novocain [Procaine] Nausea And  Vomiting    Family History  Problem Relation Age of Onset  . Heart attack Father 72    Deceased  . Diabetes Father   . Diabetes Paternal Uncle   . Diabetes Mother   . Hypertension Mother     Living  . Alzheimer's disease Maternal Grandfather   . Healthy Brother   . Healthy Daughter     History   Social History  . Marital Status: Married    Spouse Name: N/A    Number of Children: N/A  . Years of Education: N/A   Social History Main Topics  . Smoking status: Current Every Day Smoker  . Smokeless tobacco: None  . Alcohol Use: No  . Drug Use: No  . Sexual Activity: None   Other Topics Concern  . None   Social History Narrative   Review of Systems - See HPI.  All other ROS are negative.  BP 124/71 mmHg  Pulse 82  Temp(Src) 98 F (36.7 C) (Oral)  Resp 16  Ht 5' 6"  (1.676 m)  Wt 219 lb (99.338 kg)  BMI 35.36 kg/m2  SpO2 100%  LMP 06/29/2014  Physical Exam  Constitutional: She is oriented to person, place, and time and well-developed, well-nourished, and in no distress.  HENT:  Head: Normocephalic and atraumatic.  Eyes: Conjunctivae are normal. Pupils are equal, round, and reactive to light.  Neck: Neck supple.  Cardiovascular: Normal rate, regular rhythm, normal heart sounds  and intact distal pulses.   Pulmonary/Chest: Effort normal and breath sounds normal. No respiratory distress. She has no wheezes. She has no rales. She exhibits no tenderness.  Neurological: She is alert and oriented to person, place, and time.  Skin: Skin is warm. No rash noted.  Psychiatric: Affect normal.  Vitals reviewed.  Recent Results (from the past 2160 hour(s))  Comp Met (CMET)     Status: Abnormal   Collection Time: 05/10/14  7:38 AM  Result Value Ref Range   Sodium 137 135 - 145 mEq/L   Potassium 4.4 3.5 - 5.1 mEq/L   Chloride 104 96 - 112 mEq/L   CO2 21 19 - 32 mEq/L   Glucose, Bld 160 (H) 70 - 99 mg/dL   BUN 12 6 - 23 mg/dL   Creatinine, Ser 0.8 0.4 - 1.2 mg/dL    Total Bilirubin 0.3 0.2 - 1.2 mg/dL   Alkaline Phosphatase 69 39 - 117 U/L   AST 19 0 - 37 U/L   ALT 17 0 - 35 U/L   Total Protein 7.6 6.0 - 8.3 g/dL   Albumin 3.4 (L) 3.5 - 5.2 g/dL   Calcium 9.1 8.4 - 10.5 mg/dL   GFR 80.26 >60.00 mL/min  Hemoglobin A1c     Status: Abnormal   Collection Time: 05/10/14  7:38 AM  Result Value Ref Range   Hgb A1c MFr Bld 7.6 (H) 4.6 - 6.5 %    Comment: Glycemic Control Guidelines for People with Diabetes:Non Diabetic:  <6%Goal of Therapy: <7%Additional Action Suggested:  >8%   Lipid Profile     Status: Abnormal   Collection Time: 05/10/14  7:38 AM  Result Value Ref Range   Cholesterol 116 0 - 200 mg/dL    Comment: ATP III Classification       Desirable:  < 200 mg/dL               Borderline High:  200 - 239 mg/dL          High:  > = 240 mg/dL   Triglycerides 355.0 (H) 0.0 - 149.0 mg/dL    Comment: Normal:  <150 mg/dLBorderline High:  150 - 199 mg/dL   HDL 25.30 (L) >39.00 mg/dL   VLDL 71.0 (H) 0.0 - 40.0 mg/dL   Total CHOL/HDL Ratio 5     Comment:                Men          Women1/2 Average Risk     3.4          3.3Average Risk          5.0          4.42X Average Risk          9.6          7.13X Average Risk          15.0          11.0                       NonHDL 90.70     Comment: NOTE:  Non-HDL goal should be 30 mg/dL higher than patient's LDL goal (i.e. LDL goal of < 70 mg/dL, would have non-HDL goal of < 100 mg/dL)  LDL cholesterol, direct     Status: None   Collection Time: 05/10/14  7:38 AM  Result Value Ref Range   Direct LDL 46.3 mg/dL    Comment:  Optimal:  <100 mg/dLNear or Above Optimal:  100-129 mg/dLBorderline High:  130-159 mg/dLHigh:  160-189 mg/dLVery High:  >190 mg/dL    Assessment/Plan: Onychomycosis Liver enzymes good.  Will begin daily oral Lamisil.  Will reassess LFTS in one month.  If stable will complete a total of 3 months to clear infection.  Vinegar soaks discussed with patient.  Type II diabetes mellitus,  uncontrolled Increase Metformin to BID dosing.  Continue other medications as directed.  Will speak with [employer] representative to see if this could be affordable for patient.  Hypertriglyceridemia Will reassess TGL levels today.

## 2014-07-06 NOTE — Assessment & Plan Note (Signed)
Increase Metformin to BID dosing.  Continue other medications as directed.  Will speak with [employer] representative to see if this could be affordable for patient.

## 2014-07-06 NOTE — Assessment & Plan Note (Signed)
Liver enzymes good.  Will begin daily oral Lamisil.  Will reassess LFTS in one month.  If stable will complete a total of 3 months to clear infection.  Vinegar soaks discussed with patient.

## 2014-07-06 NOTE — Assessment & Plan Note (Signed)
Will reassess TGL levels today.

## 2014-07-06 NOTE — Progress Notes (Signed)
Pre visit review using our clinic review tool, if applicable. No additional management support is needed unless otherwise documented below in the visit note. 

## 2014-07-06 NOTE — Patient Instructions (Signed)
Please increase your Metformin to one tablet twice daily.  Continue all other medications as directed.  Start the Lamisil taking as directed.  Follow-up in 1 month to reassess your liver function while on the Lamisil.    I will call you once I have spoken with the [place] representative.

## 2014-07-08 ENCOUNTER — Telehealth: Payer: Self-pay | Admitting: Physician Assistant

## 2014-07-08 ENCOUNTER — Telehealth: Payer: Self-pay

## 2014-07-08 ENCOUNTER — Other Ambulatory Visit: Payer: Self-pay

## 2014-07-08 ENCOUNTER — Other Ambulatory Visit: Payer: Self-pay | Admitting: General Practice

## 2014-07-08 DIAGNOSIS — IMO0002 Reserved for concepts with insufficient information to code with codable children: Secondary | ICD-10-CM

## 2014-07-08 DIAGNOSIS — E1165 Type 2 diabetes mellitus with hyperglycemia: Secondary | ICD-10-CM

## 2014-07-08 MED ORDER — ICOSAPENT ETHYL 1 G PO CAPS: 1.0000 | ORAL_CAPSULE | Freq: Every day | ORAL | Status: DC

## 2014-07-08 MED ORDER — LISINOPRIL 10 MG PO TABS: 10.0000 mg | ORAL_TABLET | Freq: Every day | ORAL | Status: DC

## 2014-07-08 NOTE — Telephone Encounter (Signed)
-----   Message from Kris Hartmann, [place] sent at 07/08/2014 10:31 AM EST ----- Spoke with pt. States she will start the Vascepa, please send medication to [facility].

## 2014-07-08 NOTE — Telephone Encounter (Signed)
Medication sent.

## 2014-07-08 NOTE — Telephone Encounter (Signed)
-----   Message from Brunetta Jeans, PA-C sent at 07/07/2014  8:21 AM EST ----- Triglycerides are still too high.  Cannot increase dose of current medications.  I would recommend we start Vascepa which is a Rx Concentrated fish oil medication that can lower VLDL and TGL.  If she is willing, I will send in medication.

## 2014-07-08 NOTE — Telephone Encounter (Signed)
Spoke with patient and reviewed labs per recommendations.

## 2014-07-20 ENCOUNTER — Encounter: Payer: Self-pay | Admitting: Physician Assistant

## 2014-07-20 DIAGNOSIS — E1165 Type 2 diabetes mellitus with hyperglycemia: Secondary | ICD-10-CM

## 2014-07-20 DIAGNOSIS — IMO0002 Reserved for concepts with insufficient information to code with codable children: Secondary | ICD-10-CM

## 2014-07-21 MED ORDER — METFORMIN HCL 500 MG PO TABS: 500.0000 mg | ORAL_TABLET | Freq: Two times a day (BID) | ORAL | Status: DC

## 2014-08-13 ENCOUNTER — Ambulatory Visit (INDEPENDENT_AMBULATORY_CARE_PROVIDER_SITE_OTHER): Payer: Self-pay | Admitting: Physician Assistant

## 2014-08-13 ENCOUNTER — Encounter: Payer: Self-pay | Admitting: Physician Assistant

## 2014-08-13 ENCOUNTER — Other Ambulatory Visit (INDEPENDENT_AMBULATORY_CARE_PROVIDER_SITE_OTHER): Payer: Self-pay

## 2014-08-13 VITALS — BP 124/63 | HR 77 | Temp 98.2°F | Resp 16 | Ht 66.0 in | Wt 226.5 lb

## 2014-08-13 DIAGNOSIS — IMO0002 Reserved for concepts with insufficient information to code with codable children: Secondary | ICD-10-CM

## 2014-08-13 DIAGNOSIS — E781 Pure hyperglyceridemia: Secondary | ICD-10-CM

## 2014-08-13 DIAGNOSIS — B351 Tinea unguium: Secondary | ICD-10-CM

## 2014-08-13 DIAGNOSIS — E1165 Type 2 diabetes mellitus with hyperglycemia: Secondary | ICD-10-CM

## 2014-08-13 LAB — COMPREHENSIVE METABOLIC PANEL
ALBUMIN: 4 g/dL (ref 3.5–5.2)
ALT: 17 U/L (ref 0–35)
AST: 14 U/L (ref 0–37)
Alkaline Phosphatase: 60 U/L (ref 39–117)
BILIRUBIN TOTAL: 0.3 mg/dL (ref 0.2–1.2)
BUN: 15 mg/dL (ref 6–23)
CHLORIDE: 105 meq/L (ref 96–112)
CO2: 22 meq/L (ref 19–32)
CREATININE: 0.75 mg/dL (ref 0.40–1.20)
Calcium: 8.9 mg/dL (ref 8.4–10.5)
GFR: 88.86 mL/min (ref >60.00)
Glucose, Bld: 181 mg/dL — ABNORMAL HIGH (ref 70–99)
Potassium: 4 mEq/L (ref 3.5–5.1)
Sodium: 136 mEq/L (ref 135–145)
Total Protein: 7 g/dL (ref 6.0–8.3)

## 2014-08-13 LAB — LIPID PANEL
CHOL/HDL RATIO: 4
CHOLESTEROL: 98 mg/dL (ref 0–200)
HDL: 26 mg/dL — ABNORMAL LOW (ref >39.00)
NONHDL: 72
Triglycerides: 384 mg/dL — ABNORMAL HIGH (ref 0.0–149.0)
VLDL: 76.8 mg/dL — AB (ref 0.0–40.0)

## 2014-08-13 LAB — CBC
HCT: 42.5 % (ref 36.0–46.0)
HEMOGLOBIN: 14.3 g/dL (ref 12.0–15.0)
MCHC: 33.7 g/dL (ref 30.0–36.0)
MCV: 86.7 fl (ref 78.0–100.0)
Platelets: 323 10*3/uL (ref 150.0–400.0)
RBC: 4.9 Mil/uL (ref 3.87–5.11)
RDW: 15.2 % (ref 11.5–15.5)
WBC: 8.1 10*3/uL (ref 4.0–10.5)

## 2014-08-13 LAB — T4, FREE: FREE T4: 0.82 ng/dL (ref 0.60–1.60)

## 2014-08-13 LAB — TSH: TSH: 2.42 u[IU]/mL (ref 0.35–4.50)

## 2014-08-13 LAB — HEMOGLOBIN A1C: HEMOGLOBIN A1C: 8.5 % — AB (ref 4.6–6.5)

## 2014-08-13 NOTE — Patient Instructions (Signed)
Please continue medications as directed. Stop by the lab for blood work.  I will call you with your results. We will follow-up in 3 months unless you are told otherwise when called. Return sooner if you need [place].   Diabetes and Exercise Exercising regularly is important. It is not just about losing weight. It has many health benefits, such as:  Improving your overall fitness, flexibility, and endurance.  Increasing your bone density.  Helping with weight control.  Decreasing your body fat.  Increasing your muscle strength.  Reducing stress and tension.  Improving your overall health. People with diabetes who exercise gain additional benefits because exercise:  Reduces appetite.  Improves the body's use of blood sugar (glucose).  Helps lower or control blood glucose.  Decreases blood pressure.  Helps control blood lipids (such as cholesterol and triglycerides).  Improves the body's use of the hormone insulin by:  Increasing the body's insulin sensitivity.  Reducing the body's insulin needs.  Decreases the risk for heart disease because exercising:  Lowers cholesterol and triglycerides levels.  Increases the levels of good cholesterol (such as high-density lipoproteins [HDL]) in the body.  Lowers blood glucose levels. YOUR ACTIVITY PLAN  Choose an activity that you enjoy and set realistic goals. Your health care provider or diabetes educator can help you make an activity plan that works for you. Exercise regularly as directed by your health care provider. This includes:  Performing resistance training twice a week such as push-ups, sit-ups, lifting weights, or using resistance bands.  Performing 150 minutes of cardio exercises each week such as walking, running, or playing sports.  Staying active and spending no more than 90 minutes at one time being inactive. Even short bursts of exercise are good for you. Three 10-minute sessions spread throughout the day are  just as beneficial as a single 30-minute session. Some exercise ideas include:  Taking the dog for a walk.  Taking the stairs instead of the elevator.  Dancing to your favorite song.  Doing an exercise video.  Doing your favorite exercise with a friend. RECOMMENDATIONS FOR EXERCISING WITH TYPE 1 OR TYPE 2 DIABETES   Check your blood glucose before exercising. If blood glucose levels are greater than 240 mg/dL, check for urine ketones. Do not exercise if ketones are present.  Avoid injecting insulin into areas of the body that are going to be exercised. For example, avoid injecting insulin into:  The arms when playing tennis.  The legs when jogging.  Keep a record of:  Food intake before and after you exercise.  Expected peak times of insulin action.  Blood glucose levels before and after you exercise.  The type and amount of exercise you have done.  Review your records with your health care provider. Your health care provider will help you to develop guidelines for adjusting food intake and insulin amounts before and after exercising.  If you take insulin or oral hypoglycemic agents, watch for signs and symptoms of hypoglycemia. They include:  Dizziness.  Shaking.  Sweating.  Chills.  Confusion.  Drink plenty of water while you exercise to prevent dehydration or heat stroke. Body water is lost during exercise and must be replaced.  Talk to your health care provider before starting an exercise program to make sure it is safe for you. Remember, almost any type of activity is better than none. Document Released: 09/22/2003 Document Revised: 11/16/2013 Document Reviewed: 12/09/2012 [facility] Patient Information 2015 [place], [place]. This information is not intended to replace advice given  to you by your health care provider. Make sure you discuss any questions you have with your health care provider.  

## 2014-08-13 NOTE — Progress Notes (Signed)
Patient presents to clinic today for follow-up of Hypertriglyceridemia, Onychomycosis and Diabetes II, uncontrolled without complication.  Patient started on Vascepa at last visit.  Endorses taking as directed.  Continues diet and exercise.  Has been taking her Metformin 500 mg BID as directed.  Is currently taking Levemir 12 units nightly. Endorses fasting glucose around 150 most days. Is continuing other medications as directed.  Has noted improvement in her nails since starting the oral Lamisil.    Past Medical History  Diagnosis Date  . Pancreatitis, acute   . Diabetes mellitus type II, controlled   . Hyperlipidemia   . Chronic ear infection     Current Outpatient Prescriptions on File Prior to Visit  Medication Sig Dispense Refill  . fenofibrate 160 MG tablet Take 1 tablet (160 mg total) by mouth daily. 30 tablet 2  . glucose blood test strip Use as instructed 100 each 12  . Icosapent Ethyl (VASCEPA) 1 G CAPS Take 1 capsule by mouth daily. 30 capsule 2  . Insulin Detemir (LEVEMIR) 100 UNIT/ML Pen Inject 12 Units into the skin daily at 10 pm. 15 mL 2  . Insulin Pen Needle (1ST CHOICE PEN NEEDLES) 31G X 6 MM MISC For insulin injections as directed 100 each 0  . Lancets (FREESTYLE) lancets Use as instructed 100 each 12  . lisinopril (PRINIVIL,ZESTRIL) 10 MG tablet Take 1 tablet (10 mg total) by mouth daily. 30 tablet 2  . metFORMIN (GLUCOPHAGE) 500 MG tablet Take 1 tablet (500 mg total) by mouth 2 (two) times daily with a meal. 60 tablet 1  . phentermine 30 MG capsule Take 1 capsule (30 mg total) by mouth every morning. 30 capsule 1  . simvastatin (ZOCOR) 40 MG tablet TAKE ONE TABLET BY MOUTH ONCE DAILY AT  6PM 90 tablet 1  . terbinafine (LAMISIL) 250 MG tablet Take 1 tablet (250 mg total) by mouth daily. 30 tablet 1   No current facility-administered medications on file prior to visit.    Allergies  Allergen Reactions  . Novocain [Procaine] Nausea And Vomiting    Family  History  Problem Relation Age of Onset  . Heart attack Father 67    Deceased  . Diabetes Father   . Diabetes Paternal Uncle   . Diabetes Mother   . Hypertension Mother     Living  . Alzheimer's disease Maternal Grandfather   . Healthy Brother   . Healthy Daughter     History   Social History  . Marital Status: Married    Spouse Name: N/A    Number of Children: N/A  . Years of Education: N/A   Social History Main Topics  . Smoking status: Current Every Day Smoker  . Smokeless tobacco: None  . Alcohol Use: No  . Drug Use: No  . Sexual Activity: None   Other Topics Concern  . None   Social History Narrative   Review of Systems - See HPI.  All other ROS are negative.  BP 124/63 mmHg  Pulse 77  Temp(Src) 98.2 F (36.8 C) (Oral)  Resp 16  Ht _0  (1.676 m)  Wt 226 lb 8 oz (102.74 kg)  BMI 36.58 kg/m2  SpO2 100%  LMP 07/27/2014  Physical Exam  Constitutional: Teresa Parks is oriented to person, place, and time and well-developed, well-nourished, and in no distress.  HENT:  Head: Normocephalic and atraumatic.  Eyes: Conjunctivae are normal.  Neck: Neck supple.  Cardiovascular: Normal rate, regular rhythm, normal heart sounds and  intact distal pulses.   Pulmonary/Chest: Effort normal and breath sounds normal. No respiratory distress. Teresa Parks has no wheezes. Teresa Parks has no rales. Teresa Parks exhibits no tenderness.  Neurological: Teresa Parks is alert and oriented to person, place, and time.  Skin: Skin is warm and dry. No rash noted.  Psychiatric: Affect normal.  Vitals reviewed.   Recent Results (from the past 2160 hour(s))  Lipid Profile     Status: Abnormal   Collection Time: 07/06/14  7:35 AM  Result Value Ref Range   Cholesterol 112 0 - 200 mg/dL    Comment: ATP III Classification       Desirable:  < 200 mg/dL               Borderline High:  200 - 239 mg/dL          High:  > = 240 mg/dL   Triglycerides 372.0 (H) 0.0 - 149.0 mg/dL    Comment: Normal:  <150 mg/dLBorderline High:  150 -  199 mg/dL   HDL 22.30 (L) >39.00 mg/dL   VLDL 74.4 (H) 0.0 - 40.0 mg/dL   Total CHOL/HDL Ratio 5     Comment:                Men          Women1/2 Average Risk     3.4          3.3Average Risk          5.0          4.42X Average Risk          9.6          7.13X Average Risk          15.0          11.0                       NonHDL 89.70     Comment: NOTE:  Non-HDL goal should be 30 mg/dL higher than patient's LDL goal (i.e. LDL goal of < 70 mg/dL, would have non-HDL goal of < 100 mg/dL)  LDL cholesterol, direct     Status: None   Collection Time: 07/06/14  7:35 AM  Result Value Ref Range   Direct LDL 38.8 mg/dL    Comment: Optimal:  <100 mg/dLNear or Above Optimal:  100-129 mg/dLBorderline High:  130-159 mg/dLHigh:  160-189 mg/dLVery High:  >190 mg/dL  CBC     Status: None   Collection Time: 08/13/14  7:33 AM  Result Value Ref Range   WBC 8.1 4.0 - 10.5 K/uL   RBC 4.90 3.87 - 5.11 Mil/uL   Platelets 323.0 150.0 - 400.0 K/uL   Hemoglobin 14.3 12.0 - 15.0 g/dL   HCT 42.5 36.0 - 46.0 %   MCV 86.7 78.0 - 100.0 fl   MCHC 33.7 30.0 - 36.0 g/dL   RDW 15.2 11.5 - 15.5 %  Comp Met (CMET)     Status: Abnormal   Collection Time: 08/13/14  7:33 AM  Result Value Ref Range   Sodium 136 135 - 145 mEq/L   Potassium 4.0 3.5 - 5.1 mEq/L   Chloride 105 96 - 112 mEq/L   CO2 22 19 - 32 mEq/L   Glucose, Bld 181 (H) 70 - 99 mg/dL   BUN 15 6 - 23 mg/dL   Creatinine, Ser 0.75 0.40 - 1.20 mg/dL   Total Bilirubin 0.3 0.2 - 1.2 mg/dL   Alkaline Phosphatase 60  39 - 117 U/L   AST 14 0 - 37 U/L   ALT 17 0 - 35 U/L   Total Protein 7.0 6.0 - 8.3 g/dL   Albumin 4.0 3.5 - 5.2 g/dL   Calcium 8.9 8.4 - 10.5 mg/dL   GFR 88.86 >60.00 mL/min  TSH     Status: None   Collection Time: 08/13/14  7:33 AM  Result Value Ref Range   TSH 2.42 0.35 - 4.50 uIU/mL  T4, free     Status: None   Collection Time: 08/13/14  7:33 AM  Result Value Ref Range   Free T4 0.82 0.60 - 1.60 ng/dL  Hemoglobin A1c     Status: Abnormal     Collection Time: 08/13/14  7:33 AM  Result Value Ref Range   Hgb A1c MFr Bld 8.5 (H) 4.6 - 6.5 %    Comment: Glycemic Control Guidelines for People with Diabetes:Non Diabetic:  <6%Goal of Therapy: <7%Additional Action Suggested:  >8%     Assessment/Plan: Onychomycosis Improving.  Will check LFTs today.  Continue Lamisil for 3 month duration.   Hypertriglyceridemia Has started the Vascepa in addition to her Lipitor and Fenofibrate. Will check repeat Lipid panel today.   Type II diabetes mellitus, uncontrolled Will check repeat A1C today.  Encouraged increase in Metformin to 1000 mg BID but patient declines at present stating Teresa Parks would like to get back to the gym and work on her diet before increasing medication again.  Instructed her that we will need to be aggressive with medication if her fasting glucose numbers are not normalizing over the next month.

## 2014-08-16 LAB — LDL CHOLESTEROL, DIRECT: Direct LDL: 33 mg/dL

## 2014-08-16 NOTE — Assessment & Plan Note (Signed)
Improving.  Will check LFTs today.  Continue Lamisil for 3 month duration.

## 2014-08-16 NOTE — Assessment & Plan Note (Signed)
Has started the Vascepa in addition to her Lipitor and Fenofibrate. Will check repeat Lipid panel today.

## 2014-08-16 NOTE — Assessment & Plan Note (Signed)
Will check repeat A1C today.  Encouraged increase in Metformin to 1000 mg BID but patient declines at present stating she would like to get back to the gym and work on her diet before increasing medication again.  Instructed her that we will need to be aggressive with medication if her fasting glucose numbers are not normalizing over the next month.

## 2014-08-18 ENCOUNTER — Telehealth: Payer: Self-pay | Admitting: *Deleted

## 2014-08-18 DIAGNOSIS — E1165 Type 2 diabetes mellitus with hyperglycemia: Secondary | ICD-10-CM

## 2014-08-18 DIAGNOSIS — IMO0002 Reserved for concepts with insufficient information to code with codable children: Secondary | ICD-10-CM

## 2014-08-18 MED ORDER — METFORMIN HCL 500 MG PO TABS: 1000.0000 mg | ORAL_TABLET | Freq: Two times a day (BID) | ORAL | Status: DC

## 2014-08-18 NOTE — Telephone Encounter (Signed)
Patient informed, understood & agreed; new Rx to pharmacy/SLS  

## 2014-08-18 NOTE — Telephone Encounter (Signed)
-----   Message from Brunetta Jeans, PA-C sent at 08/16/2014  9:22 AM EST ----- Labs good overall.  Unfortunately A1C has risen to 8.5 since last check.  I really would recommend she let us increase the Metformin to 1000 mg BID.  She should make sure to really watch her carbohydrate intake and increase proteins.  Stay active. Remember goal morning glucose is 80-130.  Her cholesterol levels are not in yet.  We will call her once they have resulted.  Please call lab to see what the hold up is with her lipid panel.

## 2014-09-02 ENCOUNTER — Encounter: Payer: Self-pay | Admitting: Physician Assistant

## 2014-09-02 DIAGNOSIS — E781 Pure hyperglyceridemia: Secondary | ICD-10-CM

## 2014-09-03 MED ORDER — FENOFIBRATE 160 MG PO TABS: 160.0000 mg | ORAL_TABLET | Freq: Every day | ORAL | Status: DC

## 2014-09-07 ENCOUNTER — Encounter: Payer: Self-pay | Admitting: Physician Assistant

## 2014-09-07 ENCOUNTER — Ambulatory Visit (INDEPENDENT_AMBULATORY_CARE_PROVIDER_SITE_OTHER): Payer: BLUE CROSS/BLUE SHIELD | Admitting: Physician Assistant

## 2014-09-07 VITALS — BP 130/85 | HR 83 | Temp 98.2°F | Resp 16 | Ht 66.0 in | Wt 222.0 lb

## 2014-09-07 DIAGNOSIS — J208 Acute bronchitis due to other specified organisms: Secondary | ICD-10-CM | POA: Insufficient documentation

## 2014-09-07 DIAGNOSIS — T700XXA Otitic barotrauma, initial encounter: Secondary | ICD-10-CM | POA: Insufficient documentation

## 2014-09-07 MED ORDER — FLUTICASONE PROPIONATE 50 MCG/ACT NA SUSP: 2.0000 | Freq: Every day | NASAL | Status: DC

## 2014-09-07 NOTE — Progress Notes (Signed)
Pre visit review using our clinic review tool, if applicable. No additional management support is needed unless otherwise documented below in the visit note/SLS  

## 2014-09-07 NOTE — Patient Instructions (Signed)
Please take the Flonase daily as directed.  You can also use Mucinex-D to help with congestion and any residual cough.  Stay well hydrated and get plenty of rest.  Please call or return to clinic if symptoms are not improving.  Barotitis Media Barotitis media is inflammation of your middle ear. This occurs when the auditory tube (eustachian tube) leading from the back of your nose (nasopharynx) to your eardrum is blocked. This blockage may result from a cold, environmental allergies, or an upper respiratory infection. Unresolved barotitis media may lead to damage or hearing loss (barotrauma), which may become permanent. HOME CARE INSTRUCTIONS   Use medicines as recommended by your health care provider. Over-the-counter medicines will help unblock the canal and can help during times of air travel.  Do not put anything into your ears to clean or unplug them. Eardrops will not be helpful.  Do not swim, dive, or fly until your health care provider says it is all right to do so. If these activities are necessary, chewing gum with frequent, forceful swallowing may help. It is also helpful to hold your nose and gently blow to pop your ears for equalizing pressure changes. This forces air into the eustachian tube.  Only take over-the-counter or prescription medicines for pain, discomfort, or fever as directed by your health care provider.  A decongestant may be helpful in decongesting the middle ear and make pressure equalization easier. SEEK MEDICAL CARE IF:  You experience a serious form of dizziness in which you feel as if the room is spinning and you feel nauseated (vertigo).  Your symptoms only involve one ear. SEEK IMMEDIATE MEDICAL CARE IF:   You develop a severe headache, dizziness, or severe ear pain.  You have bloody or pus-like drainage from your ears.  You develop a fever.  Your problems do not improve or become worse. MAKE SURE YOU:   Understand these instructions.  Will watch  your condition.  Will get help right away if you are not doing well or get worse. Document Released: 06/29/2000 Document Revised: 04/22/2013 Document Reviewed: 01/27/2013 [facility] Patient Information 2015 [place], [place]. This information is not intended to replace advice given to you by your health care provider. Make sure you discuss any questions you have with your health care provider.

## 2014-09-07 NOTE — Assessment & Plan Note (Signed)
Resolving.  Supportive measures discussed with patient. Strict return precautions given.

## 2014-09-07 NOTE — Progress Notes (Signed)
Patient presents to clinic today c/o 5 days of head congestion, PND and scratchy throat that has been steadily improving over the past few days and is almost completely resolved.  Patient noted left ear pain and pressure yesterday.  Denies fever, chills.  Has + history of ear infection.  Wants to have her ear checked out today.  Past Medical History  Diagnosis Date  . Pancreatitis, acute   . Diabetes mellitus type II, controlled   . Hyperlipidemia   . Chronic ear infection     Current Outpatient Prescriptions on File Prior to Visit  Medication Sig Dispense Refill  . fenofibrate 160 MG tablet Take 1 tablet (160 mg total) by mouth daily. 30 tablet 2  . glucose blood test strip Use as instructed 100 each 12  . Icosapent Ethyl (VASCEPA) 1 G CAPS Take 1 capsule by mouth daily. 30 capsule 2  . Insulin Detemir (LEVEMIR) 100 UNIT/ML Pen Inject 12 Units into the skin daily at 10 pm. 15 mL 2  . Insulin Pen Needle (1ST CHOICE PEN NEEDLES) 31G X 6 MM MISC For insulin injections as directed 100 each 0  . Lancets (FREESTYLE) lancets Use as instructed 100 each 12  . lisinopril (PRINIVIL,ZESTRIL) 10 MG tablet Take 1 tablet (10 mg total) by mouth daily. 30 tablet 2  . metFORMIN (GLUCOPHAGE) 500 MG tablet Take 2 tablets (1,000 mg total) by mouth 2 (two) times daily with a meal. 120 tablet 1  . simvastatin (ZOCOR) 40 MG tablet TAKE ONE TABLET BY MOUTH ONCE DAILY AT  6PM 90 tablet 1  . terbinafine (LAMISIL) 250 MG tablet Take 1 tablet (250 mg total) by mouth daily. 30 tablet 1   No current facility-administered medications on file prior to visit.    Allergies  Allergen Reactions  . Novocain [Procaine] Nausea And Vomiting    Family History  Problem Relation Age of Onset  . Heart attack Father 37    Deceased  . Diabetes Father   . Diabetes Paternal Uncle   . Diabetes Mother   . Hypertension Mother     Living  . Alzheimer's disease Maternal Grandfather   . Healthy Brother   . Healthy  Daughter     History   Social History  . Marital Status: Married    Spouse Name: N/A  . Number of Children: N/A  . Years of Education: N/A   Social History Main Topics  . Smoking status: Current Every Day Smoker  . Smokeless tobacco: Not on file  . Alcohol Use: No  . Drug Use: No  . Sexual Activity: Not on file   Other Topics Concern  . None   Social History Narrative   Review of Systems - See HPI.  All other ROS are negative.  BP 130/85 mmHg  Pulse 83  Temp(Src) 98.2 F (36.8 C) (Oral)  Resp 16  Ht 5' 6"  (1.676 m)  Wt 222 lb (100.699 kg)  BMI 35.85 kg/m2  SpO2 99%  LMP 08/24/2014  Physical Exam  Constitutional: She is oriented to person, place, and time and well-developed, well-nourished, and in no distress.  HENT:  Head: Normocephalic and atraumatic.  Right Ear: External ear normal.  Left Ear: External ear normal.  Nose: Nose normal.  Mouth/Throat: Oropharynx is clear and moist. No oropharyngeal exudate.  Mild fluid level noted behind L TM.  Eyes: Conjunctivae are normal. Pupils are equal, round, and reactive to light.  Neck: Neck supple.  Cardiovascular: Normal rate, regular rhythm, normal  heart sounds and intact distal pulses.   Pulmonary/Chest: Effort normal and breath sounds normal. No respiratory distress. She has no wheezes. She has no rales. She exhibits no tenderness.  Neurological: She is alert and oriented to person, place, and time.  Skin: Skin is warm and dry. No rash noted.  Psychiatric: Affect normal.  Vitals reviewed.   Recent Results (from the past 2160 hour(s))  Lipid Profile     Status: Abnormal   Collection Time: 07/06/14  7:35 AM  Result Value Ref Range   Cholesterol 112 0 - 200 mg/dL    Comment: ATP III Classification       Desirable:  < 200 mg/dL               Borderline High:  200 - 239 mg/dL          High:  > = 240 mg/dL   Triglycerides 372.0 (H) 0.0 - 149.0 mg/dL    Comment: Normal:  <150 mg/dLBorderline High:  150 - 199 mg/dL     HDL 22.30 (L) >39.00 mg/dL   VLDL 74.4 (H) 0.0 - 40.0 mg/dL   Total CHOL/HDL Ratio 5     Comment:                Men          Women1/2 Average Risk     3.4          3.3Average Risk          5.0          4.42X Average Risk          9.6          7.13X Average Risk          15.0          11.0                       NonHDL 89.70     Comment: NOTE:  Non-HDL goal should be 30 mg/dL higher than patient's LDL goal (i.e. LDL goal of < 70 mg/dL, would have non-HDL goal of < 100 mg/dL)  LDL cholesterol, direct     Status: None   Collection Time: 07/06/14  7:35 AM  Result Value Ref Range   Direct LDL 38.8 mg/dL    Comment: Optimal:  <100 mg/dLNear or Above Optimal:  100-129 mg/dLBorderline High:  130-159 mg/dLHigh:  160-189 mg/dLVery High:  >190 mg/dL  CBC     Status: None   Collection Time: 08/13/14  7:33 AM  Result Value Ref Range   WBC 8.1 4.0 - 10.5 K/uL   RBC 4.90 3.87 - 5.11 Mil/uL   Platelets 323.0 150.0 - 400.0 K/uL   Hemoglobin 14.3 12.0 - 15.0 g/dL   HCT 42.5 36.0 - 46.0 %   MCV 86.7 78.0 - 100.0 fl   MCHC 33.7 30.0 - 36.0 g/dL   RDW 15.2 11.5 - 15.5 %  Comp Met (CMET)     Status: Abnormal   Collection Time: 08/13/14  7:33 AM  Result Value Ref Range   Sodium 136 135 - 145 mEq/L   Potassium 4.0 3.5 - 5.1 mEq/L   Chloride 105 96 - 112 mEq/L   CO2 22 19 - 32 mEq/L   Glucose, Bld 181 (H) 70 - 99 mg/dL   BUN 15 6 - 23 mg/dL   Creatinine, Ser 0.75 0.40 - 1.20 mg/dL   Total Bilirubin 0.3 0.2 - 1.2 mg/dL  Alkaline Phosphatase 60 39 - 117 U/L   AST 14 0 - 37 U/L   ALT 17 0 - 35 U/L   Total Protein 7.0 6.0 - 8.3 g/dL   Albumin 4.0 3.5 - 5.2 g/dL   Calcium 8.9 8.4 - 10.5 mg/dL   GFR 88.86 >60.00 mL/min  TSH     Status: None   Collection Time: 08/13/14  7:33 AM  Result Value Ref Range   TSH 2.42 0.35 - 4.50 uIU/mL  T4, free     Status: None   Collection Time: 08/13/14  7:33 AM  Result Value Ref Range   Free T4 0.82 0.60 - 1.60 ng/dL  Hemoglobin A1c     Status: Abnormal    Collection Time: 08/13/14  7:33 AM  Result Value Ref Range   Hgb A1c MFr Bld 8.5 (H) 4.6 - 6.5 %    Comment: Glycemic Control Guidelines for People with Diabetes:Non Diabetic:  <6%Goal of Therapy: <7%Additional Action Suggested:  >8%   Lipid panel     Status: Abnormal   Collection Time: 08/13/14  5:20 PM  Result Value Ref Range   Cholesterol 98 0 - 200 mg/dL    Comment: ATP III Classification       Desirable:  < 200 mg/dL               Borderline High:  200 - 239 mg/dL          High:  > = 240 mg/dL   Triglycerides 384.0 (H) 0.0 - 149.0 mg/dL    Comment: Normal:  <150 mg/dLBorderline High:  150 - 199 mg/dL   HDL 26.00 (L) >39.00 mg/dL   VLDL 76.8 (H) 0.0 - 40.0 mg/dL   Total CHOL/HDL Ratio 4     Comment:                Men          Women1/2 Average Risk     3.4          3.3Average Risk          5.0          4.42X Average Risk          9.6          7.13X Average Risk          15.0          11.0                       NonHDL 72.00     Comment: NOTE:  Non-HDL goal should be 30 mg/dL higher than patient's LDL goal (i.e. LDL goal of < 70 mg/dL, would have non-HDL goal of < 100 mg/dL)  LDL cholesterol, direct     Status: None   Collection Time: 08/13/14  5:20 PM  Result Value Ref Range   Direct LDL 33.0 mg/dL    Comment: Optimal:  <100 mg/dLNear or Above Optimal:  100-129 mg/dLBorderline High:  130-159 mg/dLHigh:  160-189 mg/dLVery High:  >190 mg/dL    Assessment/Plan: Viral bronchitis Resolving.  Supportive measures discussed with patient. Strict return precautions given.   Barotitis media Resume Flonase daily.  Refill given.  Supportive measures discussed with patient.

## 2014-09-07 NOTE — Assessment & Plan Note (Signed)
Resume Flonase daily.  Refill given.  Supportive measures discussed with patient.

## 2014-09-21 ENCOUNTER — Other Ambulatory Visit: Payer: Self-pay | Admitting: Physician Assistant

## 2014-09-21 NOTE — Telephone Encounter (Signed)
Requesting Lamisil 250mg -Take 1 table by mouth once daily. Last refill:07-06-14;#30,1 Last OV:09-07-14 Please advise.//AB/CMA

## 2014-10-25 ENCOUNTER — Encounter: Payer: Self-pay | Admitting: Physician Assistant

## 2014-10-25 ENCOUNTER — Ambulatory Visit (INDEPENDENT_AMBULATORY_CARE_PROVIDER_SITE_OTHER): Payer: Self-pay | Admitting: Physician Assistant

## 2014-10-25 VITALS — BP 130/82 | HR 84 | Temp 97.8°F | Ht 65.0 in | Wt 230.0 lb

## 2014-10-25 DIAGNOSIS — M545 Low back pain, unspecified: Secondary | ICD-10-CM | POA: Insufficient documentation

## 2014-10-25 MED ORDER — NAPROXEN 500 MG PO TABS: 500.0000 mg | ORAL_TABLET | Freq: Two times a day (BID) | ORAL | Status: DC

## 2014-10-25 MED ORDER — CYCLOBENZAPRINE HCL 10 MG PO TABS: 10.0000 mg | ORAL_TABLET | Freq: Three times a day (TID) | ORAL | Status: DC | PRN

## 2014-10-25 NOTE — Progress Notes (Signed)
Patient presents to clinic today c/o one week of low back pain after an episode of heavy lifting. Patient endorses pain is worse with movement and relieved by rest. Patient denies radiation of symptoms into her lower extremities. Denies saddle anesthesia or change to bowel or bladder habits. Has not taken anything for her symptoms.  Past Medical History  Diagnosis Date  . Pancreatitis, acute   . Diabetes mellitus type II, controlled   . Hyperlipidemia   . Chronic ear infection     Current Outpatient Prescriptions on File Prior to Visit  Medication Sig Dispense Refill  . fenofibrate 160 MG tablet Take 1 tablet (160 mg total) by mouth daily. 30 tablet 2  . fluticasone (FLONASE) 50 MCG/ACT nasal spray Place 2 sprays into both nostrils daily. 16 g 6  . glucose blood test strip Use as instructed 100 each 12  . Icosapent Ethyl (VASCEPA) 1 G CAPS Take 1 capsule by mouth daily. 30 capsule 2  . Insulin Detemir (LEVEMIR) 100 UNIT/ML Pen Inject 12 Units into the skin daily at 10 pm. 15 mL 2  . Insulin Pen Needle (1ST CHOICE PEN NEEDLES) 31G X 6 MM MISC For insulin injections as directed 100 each 0  . Lancets (FREESTYLE) lancets Use as instructed 100 each 12  . lisinopril (PRINIVIL,ZESTRIL) 10 MG tablet Take 1 tablet (10 mg total) by mouth daily. 30 tablet 2  . metFORMIN (GLUCOPHAGE) 500 MG tablet Take 2 tablets (1,000 mg total) by mouth 2 (two) times daily with a meal. 120 tablet 1  . simvastatin (ZOCOR) 40 MG tablet TAKE ONE TABLET BY MOUTH ONCE DAILY AT  6PM 90 tablet 1  . terbinafine (LAMISIL) 250 MG tablet TAKE ONE TABLET BY MOUTH ONCE DAILY 30 tablet 0   No current facility-administered medications on file prior to visit.    Allergies  Allergen Reactions  . Novocain [Procaine] Nausea And Vomiting    Family History  Problem Relation Age of Onset  . Heart attack Father 55    Deceased  . Diabetes Father   . Diabetes Paternal Uncle   . Diabetes Mother   . Hypertension Mother    Living  . Alzheimer's disease Maternal Grandfather   . Healthy Brother   . Healthy Daughter     History   Social History  . Marital Status: Married    Spouse Name: N/A  . Number of Children: N/A  . Years of Education: N/A   Social History Main Topics  . Smoking status: Current Every Day Smoker  . Smokeless tobacco: Not on file  . Alcohol Use: No  . Drug Use: No  . Sexual Activity: Not on file   Other Topics Concern  . None   Social History Narrative    Review of Systems - See HPI.  All other ROS are negative.  BP 130/82 mmHg  Pulse 84  Temp(Src) 97.8 F (36.6 C) (Oral)  Ht _0  (1.651 m)  Wt 230 lb (104.327 kg)  BMI 38.27 kg/m2  SpO2 97%  Physical Exam  Constitutional: She is oriented to person, place, and time and well-developed, well-nourished, and in no distress.  HENT:  Head: Normocephalic and atraumatic.  Cardiovascular: Normal rate, regular rhythm, normal heart sounds and intact distal pulses.   Pulmonary/Chest: Effort normal and breath sounds normal. No respiratory distress. She has no wheezes. She has no rales. She exhibits no tenderness.  Musculoskeletal:       Cervical back: Normal.  Thoracic back: Normal.       Lumbar back: She exhibits tenderness, pain and spasm. She exhibits no bony tenderness.  Neurological: She is alert and oriented to person, place, and time.  Skin: Skin is warm and dry. No rash noted.  Psychiatric: Affect normal.  Vitals reviewed.   Recent Results (from the past 2160 hour(s))  CBC     Status: None   Collection Time: 08/13/14  7:33 AM  Result Value Ref Range   WBC 8.1 4.0 - 10.5 K/uL   RBC 4.90 3.87 - 5.11 Mil/uL   Platelets 323.0 150.0 - 400.0 K/uL   Hemoglobin 14.3 12.0 - 15.0 g/dL   HCT 42.5 36.0 - 46.0 %   MCV 86.7 78.0 - 100.0 fl   MCHC 33.7 30.0 - 36.0 g/dL   RDW 15.2 11.5 - 15.5 %  Comp Met (CMET)     Status: Abnormal   Collection Time: 08/13/14  7:33 AM  Result Value Ref Range   Sodium 136 135 - 145  mEq/L   Potassium 4.0 3.5 - 5.1 mEq/L   Chloride 105 96 - 112 mEq/L   CO2 22 19 - 32 mEq/L   Glucose, Bld 181 (H) 70 - 99 mg/dL   BUN 15 6 - 23 mg/dL   Creatinine, Ser 0.75 0.40 - 1.20 mg/dL   Total Bilirubin 0.3 0.2 - 1.2 mg/dL   Alkaline Phosphatase 60 39 - 117 U/L   AST 14 0 - 37 U/L   ALT 17 0 - 35 U/L   Total Protein 7.0 6.0 - 8.3 g/dL   Albumin 4.0 3.5 - 5.2 g/dL   Calcium 8.9 8.4 - 10.5 mg/dL   GFR 88.86 >60.00 mL/min  TSH     Status: None   Collection Time: 08/13/14  7:33 AM  Result Value Ref Range   TSH 2.42 0.35 - 4.50 uIU/mL  T4, free     Status: None   Collection Time: 08/13/14  7:33 AM  Result Value Ref Range   Free T4 0.82 0.60 - 1.60 ng/dL  Hemoglobin A1c     Status: Abnormal   Collection Time: 08/13/14  7:33 AM  Result Value Ref Range   Hgb A1c MFr Bld 8.5 (H) 4.6 - 6.5 %    Comment: Glycemic Control Guidelines for People with Diabetes:Non Diabetic:  <6%Goal of Therapy: <7%Additional Action Suggested:  >8%   Lipid panel     Status: Abnormal   Collection Time: 08/13/14  5:20 PM  Result Value Ref Range   Cholesterol 98 0 - 200 mg/dL    Comment: ATP III Classification       Desirable:  < 200 mg/dL               Borderline High:  200 - 239 mg/dL          High:  > = 240 mg/dL   Triglycerides 384.0 (H) 0.0 - 149.0 mg/dL    Comment: Normal:  <150 mg/dLBorderline High:  150 - 199 mg/dL   HDL 26.00 (L) >39.00 mg/dL   VLDL 76.8 (H) 0.0 - 40.0 mg/dL   Total CHOL/HDL Ratio 4     Comment:                Men          Women1/2 Average Risk     3.4          3.3Average Risk          5.0  4.42X Average Risk          9.6          7.13X Average Risk          15.0          11.0                       NonHDL 72.00     Comment: NOTE:  Non-HDL goal should be 30 mg/dL higher than patient's LDL goal (i.e. LDL goal of < 70 mg/dL, would have non-HDL goal of < 100 mg/dL)  LDL cholesterol, direct     Status: None   Collection Time: 08/13/14  5:20 PM  Result Value Ref Range    Direct LDL 33.0 mg/dL    Comment: Optimal:  <100 mg/dLNear or Above Optimal:  100-129 mg/dLBorderline High:  130-159 mg/dLHigh:  160-189 mg/dLVery High:  >190 mg/dL    Assessment/Plan: Bilateral low back pain without sciatica Supportive measures discussed -rest, avoidance of heavy lifting or over exertion, alternating ice and heat to area, and topical Aspercreme. Rx Flexeril. Rx naproxen twice a day with food. Follow-up if symptoms are not improving.

## 2014-10-25 NOTE — Progress Notes (Signed)
Pre visit review using our clinic review tool, if applicable. No additional management support is needed unless otherwise documented below in the visit note. 

## 2014-10-25 NOTE — Patient Instructions (Signed)
Please avoid any heavy lifting or overexertion. Apply topical Aspercreme to the lower back. Alternate Ice and Heat to the lower back in 10-minute intervals, 2-3 x day. Use the Flexeril at bedtime. Use the Naprosyn twice daily as directed with food. Once resolving, we should consider a chiropractor or physical therapy.  Back Pain, Adult Back pain is very common. The pain often gets better over time. The cause of back pain is usually not dangerous. Most people can learn to manage their back pain on their own.  HOME CARE   Stay active. Start with short walks on flat ground if you can. Try to walk farther each day.  Do not sit, drive, or stand in one place for more than 30 minutes. Do not stay in bed.  Do not avoid exercise or work. Activity can help your back heal faster.  Be careful when you bend or lift an object. Bend at your knees, keep the object close to you, and do not twist.  Sleep on a firm mattress. Lie on your side, and bend your knees. If you lie on your back, put a pillow under your knees.  Only take medicines as told by your doctor.  Put ice on the injured area.  Put ice in a plastic bag.  Place a towel between your skin and the bag.  Leave the ice on for 15-20 minutes, 03-04 times a day for the first 2 to 3 days. After that, you can switch between ice and heat packs.  Ask your doctor about back exercises or massage.  Avoid feeling anxious or stressed. Find good ways to deal with stress, such as exercise. GET HELP RIGHT AWAY IF:   Your pain does not go away with rest or medicine.  Your pain does not go away in 1 week.  You have new problems.  You do not feel well.  The pain spreads into your legs.  You cannot control when you poop (bowel movement) or pee (urinate).  Your arms or legs feel weak or lose feeling (numbness).  You feel sick to your stomach (nauseous) or throw up (vomit).  You have belly (abdominal) pain.  You feel like you may pass out  (faint). MAKE SURE YOU:   Understand these instructions.  Will watch your condition.  Will get help right away if you are not doing well or get worse. Document Released: 12/19/2007 Document Revised: 09/24/2011 Document Reviewed: 11/03/2013 [facility] Patient Information 2015 [place], [place]. This information is not intended to replace advice given to you by your health care provider. Make sure you discuss any questions you have with your health care provider.

## 2014-10-25 NOTE — Assessment & Plan Note (Signed)
Supportive measures discussed -rest, avoidance of heavy lifting or over exertion, alternating ice and heat to area, and topical Aspercreme. Rx Flexeril. Rx naproxen twice a day with food. Follow-up if symptoms are not improving.

## 2014-11-02 ENCOUNTER — Other Ambulatory Visit: Payer: Self-pay | Admitting: Physician Assistant

## 2015-01-10 ENCOUNTER — Encounter: Payer: Self-pay | Admitting: Physician Assistant

## 2015-01-10 ENCOUNTER — Other Ambulatory Visit: Payer: Self-pay

## 2015-01-10 ENCOUNTER — Ambulatory Visit (INDEPENDENT_AMBULATORY_CARE_PROVIDER_SITE_OTHER): Payer: Self-pay | Admitting: Physician Assistant

## 2015-01-10 VITALS — BP 140/89 | HR 90 | Temp 98.0°F | Ht 64.25 in | Wt 222.8 lb

## 2015-01-10 DIAGNOSIS — E1165 Type 2 diabetes mellitus with hyperglycemia: Secondary | ICD-10-CM

## 2015-01-10 DIAGNOSIS — K2970 Gastritis, unspecified, without bleeding: Secondary | ICD-10-CM | POA: Insufficient documentation

## 2015-01-10 DIAGNOSIS — K2990 Gastroduodenitis, unspecified, without bleeding: Secondary | ICD-10-CM

## 2015-01-10 DIAGNOSIS — IMO0002 Reserved for concepts with insufficient information to code with codable children: Secondary | ICD-10-CM

## 2015-01-10 NOTE — Progress Notes (Signed)
Pre visit review using our clinic review tool, if applicable. No additional management support is needed unless otherwise documented below in the visit note. 

## 2015-01-10 NOTE — Assessment & Plan Note (Signed)
Will recheck A1C today, CMP and UA.  Foot exam performed without abnormal findings. Continue medications as directed.  Will alter regimen based on results.

## 2015-01-10 NOTE — Assessment & Plan Note (Signed)
Will check CBC, CMP, Lipase and UA.  Will begin Prilosec 20 mg daily over next 2 weeks.  GERD diet reviewed.  Handout given.  Follow-up 2 weeks.

## 2015-01-10 NOTE — Progress Notes (Signed)
Patient presents to clinic today c/o LUQ abdominal discomfort with indigestion over the past 5 days.  Patient with history of pancreatitis and GERD.  Is Type II diabetic, previously uncontrolled.  Denies melena, hematochezia or tenesmus. Denies nausea or vomiting.  Denies change to stools. Denies abnormal food or water source.  Past Medical History  Diagnosis Date  . Pancreatitis, acute   . Diabetes mellitus type II, controlled   . Hyperlipidemia   . Chronic ear infection     Current Outpatient Prescriptions on File Prior to Visit  Medication Sig Dispense Refill  . cyclobenzaprine (FLEXERIL) 10 MG tablet Take 1 tablet (10 mg total) by mouth 3 (three) times daily as needed for muscle spasms. 30 tablet 0  . fenofibrate 160 MG tablet Take 1 tablet (160 mg total) by mouth daily. 30 tablet 2  . fluticasone (FLONASE) 50 MCG/ACT nasal spray Place 2 sprays into both nostrils daily. 16 g 6  . glucose blood test strip Use as instructed 100 each 12  . Insulin Detemir (LEVEMIR) 100 UNIT/ML Pen Inject 12 Units into the skin daily at 10 pm. 15 mL 2  . Insulin Pen Needle (1ST CHOICE PEN NEEDLES) 31G X 6 MM MISC For insulin injections as directed 100 each 0  . Lancets (FREESTYLE) lancets Use as instructed 100 each 12  . lisinopril (PRINIVIL,ZESTRIL) 10 MG tablet Take 1 tablet (10 mg total) by mouth daily. 30 tablet 2  . metFORMIN (GLUCOPHAGE) 500 MG tablet Take 2 tablets (1,000 mg total) by mouth 2 (two) times daily with a meal. 120 tablet 1  . simvastatin (ZOCOR) 40 MG tablet TAKE ONE TABLET BY MOUTH ONCE DAILY AT  6PM 90 tablet 1  . terbinafine (LAMISIL) 250 MG tablet TAKE ONE TABLET BY MOUTH ONCE DAILY 30 tablet 0  . VASCEPA 1 G CAPS TAKE ONE CAPSULE BY MOUTH ONCE DAILY 30 capsule 5  . naproxen (NAPROSYN) 500 MG tablet Take 1 tablet (500 mg total) by mouth 2 (two) times daily with a meal. (Patient not taking: Reported on 01/10/2015) 30 tablet 0   No current facility-administered medications on file  prior to visit.    Allergies  Allergen Reactions  . Novocain [Procaine] Nausea And Vomiting    Family History  Problem Relation Age of Onset  . Heart attack Father 32    Deceased  . Diabetes Father   . Diabetes Paternal Uncle   . Diabetes Mother   . Hypertension Mother     Living  . Alzheimer's disease Maternal Grandfather   . Healthy Brother   . Healthy Daughter     History   Social History  . Marital Status: Married    Spouse Name: N/A  . Number of Children: N/A  . Years of Education: N/A   Social History Main Topics  . Smoking status: Current Every Day Smoker  . Smokeless tobacco: Not on file  . Alcohol Use: No  . Drug Use: No  . Sexual Activity: Not on file   Other Topics Concern  . None   Social History Narrative   Review of Systems - See HPI.  All other ROS are negative.  BP 140/89 mmHg  Pulse 90  Temp(Src) 98 F (36.7 C) (Oral)  Ht 5' 4.25" (1.632 m)  Wt 222 lb 12.8 oz (101.061 kg)  BMI 37.94 kg/m2  SpO2 98%  LMP 01/05/2015  Physical Exam  Constitutional: She is oriented to person, place, and time and well-developed, well-nourished, and in no distress.  HENT:  Head: Normocephalic and atraumatic.  Eyes: Conjunctivae are normal.  Neck: Neck supple.  Cardiovascular: Normal rate, regular rhythm, normal heart sounds and intact distal pulses.   Pulmonary/Chest: Effort normal and breath sounds normal. No respiratory distress. She has no wheezes. She has no rales. She exhibits no tenderness.  Abdominal: Soft. Normal appearance and bowel sounds are normal. There is no hepatosplenomegaly. There is tenderness in the left upper quadrant. No hernia.  Neurological: She is alert and oriented to person, place, and time.  Skin: Skin is warm and dry. No rash noted.  Psychiatric: Affect normal.  Vitals reviewed.  Assessment/Plan: Type II diabetes mellitus, uncontrolled Will recheck A1C today, CMP and UA.  Foot exam performed without abnormal findings.  Continue medications as directed.  Will alter regimen based on results.  Gastritis and gastroduodenitis Will check CBC, CMP, Lipase and UA.  Will begin Prilosec 20 mg daily over next 2 weeks.  GERD diet reviewed.  Handout given.  Follow-up 2 weeks.

## 2015-01-10 NOTE — Patient Instructions (Signed)
Please go to the lab for blood work.  I will call you with your results.  Begin an over-the-counter Omeprazole (Prilosec) 20 mg tablet daily over the next 14 days.  Stay well hydrated.  Limit late-night eating.  Follow-up 2 weeks.  Food Choices for Gastroesophageal Reflux Disease When you have gastroesophageal reflux disease (GERD), the foods you eat and your eating habits are very important. Choosing the right foods can help ease the discomfort of GERD. WHAT GENERAL GUIDELINES DO I NEED TO FOLLOW?  Choose fruits, vegetables, whole grains, low-fat dairy products, and low-fat meat, fish, and poultry.  Limit fats such as oils, salad dressings, butter, nuts, and avocado.  Keep a food diary to identify foods that cause symptoms.  Avoid foods that cause reflux. These may be different for different people.  Eat frequent small meals instead of three large meals each day.  Eat your meals slowly, in a relaxed setting.  Limit fried foods.  Cook foods using methods other than frying.  Avoid drinking alcohol.  Avoid drinking large amounts of liquids with your meals.  Avoid bending over or lying down until 2-3 hours after eating. WHAT FOODS ARE NOT RECOMMENDED? The following are some foods and drinks that may worsen your symptoms: Vegetables Tomatoes. Tomato juice. Tomato and spaghetti sauce. Chili peppers. Onion and garlic. Horseradish. Fruits Oranges, grapefruit, and lemon (fruit and juice). Meats High-fat meats, fish, and poultry. This includes hot dogs, ribs, ham, sausage, salami, and bacon. Dairy Whole milk and chocolate milk. Sour cream. Cream. Butter. Ice cream. Cream cheese.  Beverages Coffee and tea, with or without caffeine. Carbonated beverages or energy drinks. Condiments Hot sauce. Barbecue sauce.  Sweets/Desserts Chocolate and cocoa. Donuts. Peppermint and spearmint. Fats and Oils High-fat foods, including [place] fries and potato chips. Other Vinegar. Strong spices,  such as black pepper, white pepper, red pepper, cayenne, curry powder, cloves, ginger, and chili powder. The items listed above may not be a complete list of foods and beverages to avoid. Contact your dietitian for more information. Document Released: 07/02/2005 Document Revised: 07/07/2013 Document Reviewed: 05/06/2013 [facility] Patient Information 2015 [place], [place]. This information is not intended to replace advice given to you by your health care provider. Make sure you discuss any questions you have with your health care provider.

## 2015-01-11 LAB — COMPREHENSIVE METABOLIC PANEL
ALBUMIN: 3.9 g/dL (ref 3.5–5.2)
ALT: 33 U/L (ref 0–35)
AST: 39 U/L — AB (ref 0–37)
Alkaline Phosphatase: 79 U/L (ref 39–117)
BUN: 10 mg/dL (ref 6–23)
CALCIUM: 9.6 mg/dL (ref 8.4–10.5)
CHLORIDE: 101 meq/L (ref 96–112)
CO2: 26 meq/L (ref 19–32)
Creatinine, Ser: 0.7 mg/dL (ref 0.40–1.20)
GFR: 96.04 mL/min (ref >60.00)
Glucose, Bld: 93 mg/dL (ref 70–99)
POTASSIUM: 4.2 meq/L (ref 3.5–5.1)
Sodium: 137 mEq/L (ref 135–145)
TOTAL PROTEIN: 7.5 g/dL (ref 6.0–8.3)
Total Bilirubin: 0.3 mg/dL (ref 0.2–1.2)

## 2015-01-11 LAB — CBC WITH DIFFERENTIAL/PLATELET
BASOS PCT: 2.3 % (ref 0.0–3.0)
Basophils Absolute: 0.2 10*3/uL — ABNORMAL HIGH (ref 0.0–0.1)
EOS ABS: 0.3 10*3/uL (ref 0.0–0.7)
Eosinophils Relative: 3.8 % (ref 0.0–5.0)
HCT: 41.8 % (ref 36.0–46.0)
Hemoglobin: 13.9 g/dL (ref 12.0–15.0)
Lymphocytes Relative: 31 % (ref 12.0–46.0)
Lymphs Abs: 2.4 10*3/uL (ref 0.7–4.0)
MCHC: 33.1 g/dL (ref 30.0–36.0)
MCV: 87.2 fl (ref 78.0–100.0)
Monocytes Absolute: 0.4 10*3/uL (ref 0.1–1.0)
Monocytes Relative: 5 % (ref 3.0–12.0)
NEUTROS PCT: 57.9 % (ref 43.0–77.0)
Neutro Abs: 4.5 10*3/uL (ref 1.4–7.7)
PLATELETS: 370 10*3/uL (ref 150.0–400.0)
RBC: 4.8 Mil/uL (ref 3.87–5.11)
RDW: 16.1 % — ABNORMAL HIGH (ref 11.5–15.5)
WBC: 7.7 10*3/uL (ref 4.0–10.5)

## 2015-01-11 LAB — URINALYSIS, ROUTINE W REFLEX MICROSCOPIC
BILIRUBIN URINE: NEGATIVE
Ketones, ur: NEGATIVE
Leukocytes, UA: NEGATIVE
NITRITE: NEGATIVE
Specific Gravity, Urine: 1.01 (ref 1.000–1.030)
Total Protein, Urine: NEGATIVE
Urine Glucose: NEGATIVE
Urobilinogen, UA: 0.2 (ref 0.0–1.0)
pH: 6 (ref 5.0–8.0)

## 2015-01-11 LAB — LIPASE: Lipase: 160 U/L — ABNORMAL HIGH (ref 11.0–59.0)

## 2015-01-11 LAB — HEMOGLOBIN A1C: Hgb A1c MFr Bld: 8.3 % — ABNORMAL HIGH (ref 4.6–6.5)

## 2015-01-12 ENCOUNTER — Telehealth: Payer: Self-pay | Admitting: *Deleted

## 2015-01-12 DIAGNOSIS — R748 Abnormal levels of other serum enzymes: Secondary | ICD-10-CM

## 2015-01-12 NOTE — Telephone Encounter (Signed)
-----   Message from Brunetta Jeans, PA-C sent at 01/12/2015  8:12 AM EDT ----- Labs good overall. A1C is coming back down, will recheck in 3 months. Again goal is fasting blood sugar 80-130. Her lipase is elevated slightly, concerning that recent pain was from pancreatic inflammation. Want to recheck lipase this Friday to make sure it is coming down. Please assess for any pain, nausea or vomiting presently.

## 2015-01-12 NOTE — Telephone Encounter (Signed)
Called and spoke with the pt and informed her of recent lab results and note.  Pt verbalized understanding.  Pt states she doing good.  Lab appt was scheduled for Friday (01-14-15 @ 3:15pm).  Future lab ordered and sent.//AB/CMA

## 2015-01-14 ENCOUNTER — Other Ambulatory Visit (INDEPENDENT_AMBULATORY_CARE_PROVIDER_SITE_OTHER): Payer: Self-pay

## 2015-01-14 DIAGNOSIS — R748 Abnormal levels of other serum enzymes: Secondary | ICD-10-CM

## 2015-01-14 LAB — LIPASE: LIPASE: 59 U/L (ref 0–75)

## 2015-01-19 ENCOUNTER — Other Ambulatory Visit: Payer: Self-pay | Admitting: Physician Assistant

## 2015-02-01 ENCOUNTER — Encounter: Payer: Self-pay | Admitting: *Deleted

## 2015-03-02 ENCOUNTER — Other Ambulatory Visit: Payer: Self-pay | Admitting: Physician Assistant

## 2015-04-14 ENCOUNTER — Telehealth: Payer: Self-pay | Admitting: Physician Assistant

## 2015-04-14 NOTE — Telephone Encounter (Signed)
[facility]  Patient Name: Teresa Parks  DOB: May 09, 1971    Initial Comment Caller states she has been vomiting today.    Nurse Assessment  Nurse: Harlow Mares, RN, Suanne Marker Date/Time (Eastern Time): 04/14/2015 3:56:21 PM  Confirm and document reason for call. If symptomatic, describe symptoms. ---Caller states she has been vomiting today. Reports that she has vomited 4 times in the last couple of hours. Reports that she is vomiting clear water, she has not able to eat today. Reports that she has vomited everything in the last 2 hrs. Denies fever. She also reports LUQ abd pain.  Has the patient traveled out of the country within the last 30 days? ---No  Does the patient require triage? ---Yes  Related visit to physician within the last 2 weeks? ---No  Does the PT have any chronic conditions? (i.e. diabetes, asthma, etc.) ---Yes  List chronic conditions. ---pancreatitis 1 year ago  Did the patient indicate they were pregnant? ---No     Guidelines    Guideline Title Affirmed Question Affirmed Notes  Vomiting [1] Constant abdominal pain AND [2] present > 2 hours    Final Disposition User   See Physician within 4 Hours (or PCP triage) Harlow Mares, RN, Rhonda    Comments  Caller states that she doesn't want to go to [place], would like appt with MD tomorrow. Appt scheduled for 3:45p 04/15/15 with Dr. Myrla Halsted.   Referrals  REFERRED TO PCP OFFICE   Disagree/Comply: Disagree  Disagree/Comply Reason: Wait and see

## 2015-04-15 ENCOUNTER — Encounter: Payer: Self-pay | Admitting: Medical

## 2015-04-15 ENCOUNTER — Emergency Department (HOSPITAL_BASED_OUTPATIENT_CLINIC_OR_DEPARTMENT_OTHER)
Admission: EM | Admit: 2015-04-15 | Discharge: 2015-04-15 | Disposition: A | Discharge status: Home / Self Care | Payer: Self-pay | Attending: Emergency Medicine | Admitting: Emergency Medicine

## 2015-04-15 ENCOUNTER — Ambulatory Visit (INDEPENDENT_AMBULATORY_CARE_PROVIDER_SITE_OTHER): Payer: Self-pay | Admitting: Medical

## 2015-04-15 ENCOUNTER — Encounter (HOSPITAL_BASED_OUTPATIENT_CLINIC_OR_DEPARTMENT_OTHER): Payer: Self-pay

## 2015-04-15 VITALS — BP 131/77 | HR 117 | Temp 98.8°F | Wt 217.0 lb

## 2015-04-15 DIAGNOSIS — Z7951 Long term (current) use of inhaled steroids: Secondary | ICD-10-CM | POA: Insufficient documentation

## 2015-04-15 DIAGNOSIS — Z79899 Other long term (current) drug therapy: Secondary | ICD-10-CM | POA: Insufficient documentation

## 2015-04-15 DIAGNOSIS — Z720 Tobacco use: Secondary | ICD-10-CM | POA: Insufficient documentation

## 2015-04-15 DIAGNOSIS — Z3202 Encounter for pregnancy test, result negative: Secondary | ICD-10-CM | POA: Insufficient documentation

## 2015-04-15 DIAGNOSIS — E1165 Type 2 diabetes mellitus with hyperglycemia: Secondary | ICD-10-CM | POA: Insufficient documentation

## 2015-04-15 DIAGNOSIS — R1011 Right upper quadrant pain: Secondary | ICD-10-CM

## 2015-04-15 DIAGNOSIS — K859 Acute pancreatitis, unspecified: Secondary | ICD-10-CM | POA: Insufficient documentation

## 2015-04-15 DIAGNOSIS — Z794 Long term (current) use of insulin: Secondary | ICD-10-CM | POA: Insufficient documentation

## 2015-04-15 DIAGNOSIS — R739 Hyperglycemia, unspecified: Secondary | ICD-10-CM

## 2015-04-15 DIAGNOSIS — E871 Hypo-osmolality and hyponatremia: Secondary | ICD-10-CM | POA: Insufficient documentation

## 2015-04-15 LAB — CBC WITH DIFFERENTIAL/PLATELET
BASOS PCT: 0 %
Basophils Absolute: 0 10*3/uL (ref 0.0–0.1)
Eosinophils Absolute: 0.1 10*3/uL (ref 0.0–0.7)
Eosinophils Relative: 0 %
HEMATOCRIT: 42.8 % (ref 36.0–46.0)
HEMOGLOBIN: 15.1 g/dL — AB (ref 12.0–15.0)
LYMPHS ABS: 1.1 10*3/uL (ref 0.7–4.0)
Lymphocytes Relative: 8 %
MCH: 30 pg (ref 26.0–34.0)
MCHC: 35.3 g/dL (ref 30.0–36.0)
MCV: 84.9 fL (ref 78.0–100.0)
MONOS PCT: 6 %
Monocytes Absolute: 0.9 10*3/uL (ref 0.1–1.0)
NEUTROS ABS: 13.2 10*3/uL — AB (ref 1.7–7.7)
NEUTROS PCT: 86 %
Platelets: 385 10*3/uL (ref 150–400)
RBC: 5.04 MIL/uL (ref 3.87–5.11)
RDW: 15.4 % (ref 11.5–15.5)
WBC: 15.2 10*3/uL — ABNORMAL HIGH (ref 4.0–10.5)

## 2015-04-15 LAB — LIPASE, BLOOD: Lipase: 306 U/L — ABNORMAL HIGH (ref 22–51)

## 2015-04-15 LAB — COMPREHENSIVE METABOLIC PANEL
ALBUMIN: 3.4 g/dL — AB (ref 3.5–5.0)
ALT: 19 U/L (ref 14–54)
ANION GAP: 10 (ref 5–15)
AST: 23 U/L (ref 15–41)
Alkaline Phosphatase: 87 U/L (ref 38–126)
BUN: 10 mg/dL (ref 6–20)
CHLORIDE: 97 mmol/L — AB (ref 101–111)
CO2: 21 mmol/L — AB (ref 22–32)
Calcium: 8.7 mg/dL — ABNORMAL LOW (ref 8.9–10.3)
Creatinine, Ser: 0.57 mg/dL (ref 0.44–1.00)
GFR calc Af Amer: >60 mL/min (ref >60)
GFR calc non Af Amer: >60 mL/min (ref >60)
GLUCOSE: 250 mg/dL — AB (ref 65–99)
POTASSIUM: 3.6 mmol/L (ref 3.5–5.1)
SODIUM: 128 mmol/L — AB (ref 135–145)
Total Bilirubin: 0.9 mg/dL (ref 0.3–1.2)
Total Protein: 7.1 g/dL (ref 6.5–8.1)

## 2015-04-15 LAB — PREGNANCY, URINE: Preg Test, Ur: NEGATIVE

## 2015-04-15 LAB — URINALYSIS, ROUTINE W REFLEX MICROSCOPIC
Glucose, UA: >1000 mg/dL — AB
KETONES UR: 15 mg/dL — AB
LEUKOCYTES UA: NEGATIVE
Nitrite: NEGATIVE
Specific Gravity, Urine: 1.027 (ref 1.005–1.030)
Urobilinogen, UA: 1 mg/dL (ref 0.0–1.0)
pH: 6 (ref 5.0–8.0)

## 2015-04-15 LAB — URINE MICROSCOPIC-ADD ON

## 2015-04-15 MED ORDER — PROMETHAZINE HCL 25 MG PO TABS: 25.0000 mg | ORAL_TABLET | Freq: Four times a day (QID) | ORAL | Status: DC | PRN

## 2015-04-15 MED ORDER — OXYCODONE-ACETAMINOPHEN 5-325 MG PO TABS: 1.0000 | ORAL_TABLET | ORAL | Status: DC | PRN

## 2015-04-15 MED ORDER — ONDANSETRON HCL 4 MG/2ML IJ SOLN
4.0000 mg | Freq: Once | INTRAMUSCULAR | Status: AC
  Administered 2015-04-15: 4 mg via INTRAVENOUS
  Filled 2015-04-15: qty 2

## 2015-04-15 MED ORDER — MORPHINE SULFATE (PF) 4 MG/ML IV SOLN
4.0000 mg | Freq: Once | INTRAVENOUS | Status: AC
  Administered 2015-04-15: 4 mg via INTRAVENOUS
  Filled 2015-04-15: qty 1

## 2015-04-15 MED ORDER — SODIUM CHLORIDE 0.9 % IV BOLUS (SEPSIS)
1000.0000 mL | Freq: Once | INTRAVENOUS | Status: AC
  Administered 2015-04-15: 1000 mL via INTRAVENOUS

## 2015-04-15 NOTE — ED Notes (Signed)
Abd pain, n/v started yesterday-pt sent from in house PCP

## 2015-04-15 NOTE — Progress Notes (Signed)
Pre visit review using our clinic review tool, if applicable. No additional management support is needed unless otherwise documented below in the visit note. 

## 2015-04-15 NOTE — Patient Instructions (Addendum)
History of pancreatitis. With acute symptoms over past 24 hours.  That may represent recurrent bout.   I do recommend evaluation in the ED. I called down and notified  ED physician of your presentation and they know you are on your way down.   Follow up here pas need post ED evaluation

## 2015-04-15 NOTE — ED Provider Notes (Signed)
CSN: [id number]     Arrival date & time 04/15/15  1635 History   First MD Initiated Contact with Patient 04/15/15 1650     Chief Complaint  Patient presents with  . Abdominal Pain     (Consider location/radiation/quality/duration/timing/severity/associated sxs/prior Treatment) HPI Comments: Patient presents with abdominal pain. She has a history of pancreatitis felt to be secondary to elevated triglycerides. She's had 2 episodes of pancreatitis in the past.  Both were last year. She's had some increased pain across her upper abdomen for the last 2 days. It started yesterday evening. She's had associated nausea and vomiting and has been unable to keep anything down other than small amounts of water. Her emesis is nonbloody and nonbilious. She denies any change in bowel habits. She denies any known fevers. She states the pain is similar to her past episodes of pancreatitis. She had an ultrasound of her gallbladder last July with her first episode of pancreatitis which was negative for gallstones. She denies any alcohol use.  Patient is a 44 y.o. female presenting with abdominal pain.  Abdominal Pain Associated symptoms: nausea and vomiting   Associated symptoms: no chest pain, no chills, no cough, no diarrhea, no fatigue, no fever, no hematuria and no shortness of breath     Past Medical History  Diagnosis Date  . Pancreatitis, acute   . Diabetes mellitus type II, controlled   . Hyperlipidemia   . Chronic ear infection    Past Surgical History  Procedure Laterality Date  . Hernia repair    . Knee surgery    . Wisdom tooth extraction     Family History  Problem Relation Age of Onset  . Heart attack Father 73    Deceased  . Diabetes Father   . Diabetes Paternal Uncle   . Diabetes Mother   . Hypertension Mother     Living  . Alzheimer's disease Maternal Grandfather   . Healthy Brother   . Healthy Daughter    Social History  Substance Use Topics  . Smoking status: Current  Every Day Smoker  . Smokeless tobacco: None  . Alcohol Use: No   OB History    No data available     Review of Systems  Constitutional: Negative for fever, chills, diaphoresis and fatigue.  HENT: Negative for congestion, rhinorrhea and sneezing.   Eyes: Negative.   Respiratory: Negative for cough, chest tightness and shortness of breath.   Cardiovascular: Negative for chest pain and leg swelling.  Gastrointestinal: Positive for nausea, vomiting and abdominal pain. Negative for diarrhea and blood in stool.  Genitourinary: Negative for frequency, hematuria, flank pain and difficulty urinating.  Musculoskeletal: Negative for back pain and arthralgias.  Skin: Negative for rash.  Neurological: Negative for dizziness, speech difficulty, weakness, numbness and headaches.      Allergies  Novocain  Home Medications   Prior to Admission medications   Medication Sig Start Date End Date Taking? Authorizing Provider  cyclobenzaprine (FLEXERIL) 10 MG tablet Take 1 tablet (10 mg total) by mouth 3 (three) times daily as needed for muscle spasms. 10/25/14   Brunetta Jeans, PA-C  fenofibrate 160 MG tablet TAKE ONE TABLET BY MOUTH ONCE DAILY 03/02/15   Brunetta Jeans, PA-C  fluticasone [facility]) 50 MCG/ACT nasal spray Place 2 sprays into both nostrils daily. 09/07/14   Brunetta Jeans, PA-C  glucose blood test strip Use as instructed 02/08/14   Sheila Oats, MD  Insulin Detemir (LEVEMIR) 100 UNIT/ML Pen Inject 12 Units  into the skin daily at 10 pm. 03/04/14   Brunetta Jeans, PA-C  Insulin Pen Needle (1ST CHOICE PEN NEEDLES) 31G X 6 MM MISC For insulin injections as directed 02/08/14   Sheila Oats, MD  Lancets (FREESTYLE) lancets Use as instructed 02/08/14   Sheila Oats, MD  lisinopril (PRINIVIL,ZESTRIL) 10 MG tablet Take 1 tablet (10 mg total) by mouth daily. 07/08/14   Brunetta Jeans, PA-C  metFORMIN (GLUCOPHAGE) 500 MG tablet TAKE ONE TABLET BY MOUTH TWICE DAILY WITH MEALS 01/19/15    Brunetta Jeans, PA-C  oxyCODONE-acetaminophen (PERCOCET) 5-325 MG tablet Take 1-2 tablets by mouth every 4 (four) hours as needed. 04/15/15   Malvin Johns, MD  promethazine (PHENERGAN) 25 MG tablet Take 1 tablet (25 mg total) by mouth every 6 (six) hours as needed for nausea or vomiting. 04/15/15   Malvin Johns, MD  simvastatin (ZOCOR) 40 MG tablet TAKE ONE TABLET BY MOUTH ONCE DAILY AT  6PM 05/10/14   Brunetta Jeans, PA-C   BP 113/86 mmHg  Pulse 100  Temp(Src) 99.1 F (37.3 C) (Oral)  Resp 18  Ht 5\' 5"  (1.651 m)  Wt 217 lb (98.431 kg)  BMI 36.11 kg/m2  SpO2 94%  LMP 04/06/2015 Physical Exam  Constitutional: She is oriented to person, place, and time. She appears well-developed and well-nourished.  HENT:  Head: Normocephalic and atraumatic.  Eyes: Pupils are equal, round, and reactive to light.  Neck: Normal range of motion. Neck supple.  Cardiovascular: Normal rate, regular rhythm and normal heart sounds.   Pulmonary/Chest: Effort normal and breath sounds normal. No respiratory distress. She has no wheezes. She has no rales. She exhibits no tenderness.  Abdominal: Soft. Bowel sounds are normal. There is tenderness (moderate tenderness to epigastrium and right upper abdomen). There is no rebound and no guarding.  Musculoskeletal: Normal range of motion. She exhibits no edema.  Lymphadenopathy:    She has no cervical adenopathy.  Neurological: She is alert and oriented to person, place, and time.  Skin: Skin is warm and dry. No rash noted.  Psychiatric: She has a normal mood and affect.    ED Course  Procedures (including critical care time) Labs Review Labs Reviewed  URINALYSIS, ROUTINE W REFLEX MICROSCOPIC (NOT AT [facility]) - Abnormal; Notable for the following:    Color, Urine AMBER (*)    APPearance CLOUDY (*)    Glucose, UA >1000 (*)    Hgb urine dipstick MODERATE (*)    Bilirubin Urine SMALL (*)    Ketones, ur 15 (*)    Protein, ur >300 (*)    All other components  within normal limits  COMPREHENSIVE METABOLIC PANEL - Abnormal; Notable for the following:    Sodium 128 (*)    Chloride 97 (*)    CO2 21 (*)    Glucose, Bld 250 (*)    Calcium 8.7 (*)    Albumin 3.4 (*)    All other components within normal limits  LIPASE, BLOOD - Abnormal; Notable for the following:    Lipase 306 (*)    All other components within normal limits  CBC WITH DIFFERENTIAL/PLATELET - Abnormal; Notable for the following:    WBC 15.2 (*)    Hemoglobin 15.1 (*)    Neutro Abs 13.2 (*)    All other components within normal limits  URINE MICROSCOPIC-ADD ON - Abnormal; Notable for the following:    Bacteria, UA FEW (*)    All other components within normal limits  PREGNANCY, URINE    Imaging Review No results found. I have personally reviewed and evaluated these images and lab results as part of my medical decision-making.   EKG Interpretation None      MDM   Final diagnoses:  Acute pancreatitis, unspecified pancreatitis type  Hyperglycemia  Hyponatremia    Patient presents with upper abdominal pain consistent with her past episodes of pancreatitis. Her lipase is elevated to 306. She also has an elevated white blood cell count and hyponatremia as well as hyperglycemia. Given these findings, I felt the patient should be admitted to the hospital.  At this point patient is refusing admission. She wants to give it a try at home. I did advise her of the risk of leaving. She does not want to be admitted but she states she will return if she's not feeling better tomorrow or she has any worsening symptoms. She was given 2 L IV fluids in the ED. Her heart rate has improved. Her pain is improved. She has no ongoing vomiting. She's tolerating ice chips. I advised her to use a clear liquid diet. She was given prescription for Percocet and Phenergan for symptomatic relief. She was advised to follow-up with her PCP on Monday or return here if she has any worsening symptoms over the  weekend.    Malvin Johns, MD 04/15/15 2040

## 2015-04-15 NOTE — Discharge Instructions (Signed)
Acute Pancreatitis Acute pancreatitis is a disease in which the pancreas becomes suddenly inflamed. The pancreas is a large gland located behind your stomach. The pancreas produces enzymes that help digest food. The pancreas also releases the hormones glucagon and insulin that help regulate blood sugar. Damage to the pancreas occurs when the digestive enzymes from the pancreas are activated and begin attacking the pancreas before being released into the intestine. Most acute attacks last a couple of days and can cause serious complications. Some people become dehydrated and develop low blood pressure. In severe cases, bleeding into the pancreas can lead to shock and can be life-threatening. The lungs, heart, and kidneys may fail. CAUSES  Pancreatitis can happen to anyone. In some cases, the cause is unknown. Most cases are caused by:  Alcohol abuse.  Gallstones. Other less common causes are:  Certain medicines.  Exposure to certain chemicals.  Infection.  Damage caused by an accident (trauma).  Abdominal surgery. SYMPTOMS   Pain in the upper abdomen that may radiate to the back.  Tenderness and swelling of the abdomen.  Nausea and vomiting. DIAGNOSIS  Your caregiver will perform a physical exam. Blood and stool tests may be done to confirm the diagnosis. Imaging tests may also be done, such as X-rays, CT scans, or an ultrasound of the abdomen. TREATMENT  Treatment usually requires a stay in the hospital. Treatment may include:  Pain medicine.  Fluid replacement through an intravenous line (IV).  Placing a tube in the stomach to remove stomach contents and control vomiting.  Not eating for 3 or 4 days. This gives your pancreas a rest, because enzymes are not being produced that can cause further damage.  Antibiotic medicines if your condition is caused by an infection.  Surgery of the pancreas or gallbladder. HOME CARE INSTRUCTIONS   Follow the diet advised by your  caregiver. This may involve avoiding alcohol and decreasing the amount of fat in your diet.  Eat smaller, more frequent meals. This reduces the amount of digestive juices the pancreas produces.  Drink enough fluids to keep your urine clear or pale yellow.  Only take over-the-counter or prescription medicines as directed by your caregiver.  Avoid drinking alcohol if it caused your condition.  Do not smoke.  Get plenty of rest.  Check your blood sugar at home as directed by your caregiver.  Keep all follow-up appointments as directed by your caregiver. SEEK MEDICAL CARE IF:   You do not recover as quickly as expected.  You develop new or worsening symptoms.  You have persistent pain, weakness, or nausea.  You recover and then have another episode of pain. SEEK IMMEDIATE MEDICAL CARE IF:   You are unable to eat or keep fluids down.  Your pain becomes severe.  You have a fever or persistent symptoms for more than 2 to 3 days.  You have a fever and your symptoms suddenly get worse.  Your skin or the white part of your eyes turn yellow (jaundice).  You develop vomiting.  You feel dizzy, or you faint.  Your blood sugar is high (over 300 mg/dL). MAKE SURE YOU:   Understand these instructions.  Will watch your condition.  Will get help right away if you are not doing well or get worse. Document Released: 07/02/2005 Document Revised: 01/01/2012 Document Reviewed: 10/11/2011 [facility] Patient Information 2015 [place], [place]. This information is not intended to replace advice given to you by your health care provider. Make sure you discuss any questions you have  with your health care provider.  Hyponatremia  Hyponatremia is when the amount of salt (sodium) in your blood is too low. When sodium levels are low, your cells will absorb extra water and swell. The swelling happens throughout the body, but it mostly affects the brain. Severe brain swelling (cerebral edema),  seizures, or coma can happen.  CAUSES   Heart, kidney, or liver problems.  Thyroid problems.  Adrenal gland problems.  Severe vomiting and diarrhea.  Certain medicines or illegal drugs.  Dehydration.  Drinking too much water.  Low-sodium diet. SYMPTOMS   Nausea and vomiting.  Confusion.  Lethargy.  Agitation.  Headache.  Twitching or shaking (seizures).  Unconsciousness.  Appetite loss.  Muscle weakness and cramping. DIAGNOSIS  Hyponatremia is identified by a simple blood test. Your caregiver will perform a history and physical exam to try to find the cause and type of hyponatremia. Other tests may be needed to measure the amount of sodium in your blood and urine. TREATMENT  Treatment will depend on the cause.   Fluids may be given through the vein (IV).  Medicines may be used to correct the sodium imbalance. If medicines are causing the problem, they will need to be adjusted.  Water or fluid intake may be restricted to restore proper balance. The speed of correcting the sodium problem is very important. If the problem is corrected too fast, nerve damage (sometimes unchangeable) can happen. HOME CARE INSTRUCTIONS   Only take medicines as directed by your caregiver. Many medicines can make hyponatremia worse. Discuss all your medicines with your caregiver.  Carefully follow any recommended diet, including any fluid restrictions.  You may be asked to repeat lab tests. Follow these directions.  Avoid alcohol and recreational drugs. SEEK MEDICAL CARE IF:   You develop worsening nausea, fatigue, headache, confusion, or weakness.  Your original hyponatremia symptoms return.  You have problems following the recommended diet. SEEK IMMEDIATE MEDICAL CARE IF:   You have a seizure.  You faint.  You have ongoing diarrhea or vomiting. MAKE SURE YOU:   Understand these instructions.  Will watch your condition.  Will get help right away if you are not  doing well or get worse. Document Released: 06/22/2002 Document Revised: 09/24/2011 Document Reviewed: 12/17/2010 [facility] Patient Information 2015 [place], [place]. This information is not intended to replace advice given to you by your health care provider. Make sure you discuss any questions you have with your health care provider.  Hyperglycemia Hyperglycemia occurs when the glucose (sugar) in your blood is too high. Hyperglycemia can happen for many reasons, but it most often happens to people who do not know they have diabetes or are not managing their diabetes properly.  CAUSES  Whether you have diabetes or not, there are other causes of hyperglycemia. Hyperglycemia can occur when you have diabetes, but it can also occur in other situations that you might not be as aware of, such as: Diabetes  If you have diabetes and are having problems controlling your blood glucose, hyperglycemia could occur because of some of the following reasons:  Not following your meal plan.  Not taking your diabetes medications or not taking it properly.  Exercising less or doing less activity than you normally do.  Being sick. Pre-diabetes  This cannot be ignored. Before people develop Type 2 diabetes, they almost always have "pre-diabetes." This is when your blood glucose levels are higher than normal, but not yet high enough to be diagnosed as diabetes. Research has shown that some  long-term damage to the body, especially the heart and circulatory system, may already be occurring during pre-diabetes. If you take action to manage your blood glucose when you have pre-diabetes, you may delay or prevent Type 2 diabetes from developing. Stress  If you have diabetes, you may be "diet" controlled or on oral medications or insulin to control your diabetes. However, you may find that your blood glucose is higher than usual in the hospital whether you have diabetes or not. This is often referred to as "stress  hyperglycemia." Stress can elevate your blood glucose. This happens because of hormones put out by the body during times of stress. If stress has been the cause of your high blood glucose, it can be followed regularly by your caregiver. That way he/she can make sure your hyperglycemia does not continue to get worse or progress to diabetes. Steroids  Steroids are medications that act on the infection fighting system (immune system) to block inflammation or infection. One side effect can be a rise in blood glucose. Most people can produce enough extra insulin to allow for this rise, but for those who cannot, steroids make blood glucose levels go even higher. It is not unusual for steroid treatments to "uncover" diabetes that is developing. It is not always possible to determine if the hyperglycemia will go away after the steroids are stopped. A special blood test called an A1c is sometimes done to determine if your blood glucose was elevated before the steroids were started. SYMPTOMS  Thirsty.  Frequent urination.  Dry mouth.  Blurred vision.  Tired or fatigue.  Weakness.  Sleepy.  Tingling in feet or leg. DIAGNOSIS  Diagnosis is made by monitoring blood glucose in one or all of the following ways:  A1c test. This is a chemical found in your blood.  Fingerstick blood glucose monitoring.  Laboratory results. TREATMENT  First, knowing the cause of the hyperglycemia is important before the hyperglycemia can be treated. Treatment may include, but is not be limited to:  Education.  Change or adjustment in medications.  Change or adjustment in meal plan.  Treatment for an illness, infection, etc.  More frequent blood glucose monitoring.  Change in exercise plan.  Decreasing or stopping steroids.  Lifestyle changes. HOME CARE INSTRUCTIONS   Test your blood glucose as directed.  Exercise regularly. Your caregiver will give you instructions about exercise. Pre-diabetes or  diabetes which comes on with stress is helped by exercising.  Eat wholesome, balanced meals. Eat often and at regular, fixed times. Your caregiver or nutritionist will give you a meal plan to guide your sugar intake.  Being at an ideal weight is important. If needed, losing as little as 10 to 15 pounds may help improve blood glucose levels. SEEK MEDICAL CARE IF:   You have questions about medicine, activity, or diet.  You continue to have symptoms (problems such as increased thirst, urination, or weight gain). SEEK IMMEDIATE MEDICAL CARE IF:   You are vomiting or have diarrhea.  Your breath smells fruity.  You are breathing faster or slower.  You are very sleepy or incoherent.  You have numbness, tingling, or pain in your feet or hands.  You have chest pain.  Your symptoms get worse even though you have been following your caregiver's orders.  If you have any other questions or concerns. Document Released: 12/26/2000 Document Revised: 09/24/2011 Document Reviewed: 10/29/2011 [facility] Patient Information 2015 [place], [place]. This information is not intended to replace advice given to you by your  health care provider. Make sure you discuss any questions you have with your health care provider. ° °

## 2015-04-15 NOTE — Progress Notes (Signed)
Subjective:    Patient ID: Teresa Parks, female    DOB: 04-Nov-1970, 44 y.o.   MRN: [id number]  HPI  Pt in with some abdomen pain. Pt states abdomen pain started yesterday at 2 pm. Pt states pain across top of her abdomen. Pain started yesterday afternoon. She can't keep anything down all day yesterday. Pt vomited from 2 pm to 6 am. Vomited every hour on the hour. Hx of pancreatitis.Hx of severe high triglycerides. In addition she had high blood sugar in the past as well.  No recent alcohol use.   Since this am no vomiting. But only drinking. Only drinking water this am.   Pt has had fever chills and sweats.  Pt February 04, 2014 admitted for pancreatitis.     Review of Systems  Constitutional: Positive for fever, chills and diaphoresis. Negative for fatigue.  Respiratory: Negative for cough, chest tightness and wheezing.   Cardiovascular: Negative for chest pain and palpitations.  Gastrointestinal: Positive for nausea, vomiting and abdominal pain.  Musculoskeletal: Negative for back pain.  Neurological: Negative for dizziness.  Hematological: Negative for adenopathy. Does not bruise/bleed easily.  Psychiatric/Behavioral: Negative for behavioral problems and confusion.   Past Medical History  Diagnosis Date  . Pancreatitis, acute   . Diabetes mellitus type II, controlled   . Hyperlipidemia   . Chronic ear infection     Social History   Social History  . Marital Status: Married    Spouse Name: N/A  . Number of Children: N/A  . Years of Education: N/A   Occupational History  . Not on file.   Social History Main Topics  . Smoking status: Current Every Day Smoker  . Smokeless tobacco: Not on file  . Alcohol Use: No  . Drug Use: No  . Sexual Activity: Not on file   Other Topics Concern  . Not on file   Social History Narrative    Past Surgical History  Procedure Laterality Date  . Hernia repair    . Knee surgery    . Wisdom tooth extraction      Family  History  Problem Relation Age of Onset  . Heart attack Father 25    Deceased  . Diabetes Father   . Diabetes Paternal Uncle   . Diabetes Mother   . Hypertension Mother     Living  . Alzheimer's disease Maternal Grandfather   . Healthy Brother   . Healthy Daughter     Allergies  Allergen Reactions  . Novocain [Procaine] Nausea And Vomiting    Current Outpatient Prescriptions on File Prior to Visit  Medication Sig Dispense Refill  . cyclobenzaprine (FLEXERIL) 10 MG tablet Take 1 tablet (10 mg total) by mouth 3 (three) times daily as needed for muscle spasms. 30 tablet 0  . fenofibrate 160 MG tablet TAKE ONE TABLET BY MOUTH ONCE DAILY 30 tablet 0  . fluticasone (FLONASE) 50 MCG/ACT nasal spray Place 2 sprays into both nostrils daily. 16 g 6  . glucose blood test strip Use as instructed 100 each 12  . Insulin Detemir (LEVEMIR) 100 UNIT/ML Pen Inject 12 Units into the skin daily at 10 pm. 15 mL 2  . Insulin Pen Needle (1ST CHOICE PEN NEEDLES) 31G X 6 MM MISC For insulin injections as directed 100 each 0  . Lancets (FREESTYLE) lancets Use as instructed 100 each 12  . lisinopril (PRINIVIL,ZESTRIL) 10 MG tablet Take 1 tablet (10 mg total) by mouth daily. 30 tablet 2  .  metFORMIN (GLUCOPHAGE) 500 MG tablet TAKE ONE TABLET BY MOUTH TWICE DAILY WITH MEALS 60 tablet 5  . simvastatin (ZOCOR) 40 MG tablet TAKE ONE TABLET BY MOUTH ONCE DAILY AT  6PM 90 tablet 1   No current facility-administered medications on file prior to visit.    BP 131/77 mmHg  Pulse 117  Temp(Src) 98.8 F (37.1 C) (Oral)  Wt 217 lb (98.431 kg)  SpO2 95%  LMP 04/06/2015       Objective:   Physical Exam  General Appearance- Not in acute distress.  HEENT Eyes- Scleraeral/Conjuntiva-bilat- Not Yellow. Mouth & Throat- Normal.  Chest and Lung Exam Auscultation: Breath sounds:-Normal. Adventitious sounds:- No Adventitious sounds.  Cardiovascular Auscultation:Rythm - Regular. Heart Sounds -Normal heart  sounds.  Abdomen Inspection:-normal. Palpation/Perucssion: Palpation and Percussion of the abdomen reveal- moderate to severe rt upper quadrant  Tender, mild tender luq No Rebound tenderness, No rigidity(Guarding) and No Palpable abdominal masses.  Liver:-Normal.  Spleen:- Normal.   Back- no cva tenderness       Assessment & Plan:  History of pancreatitis. With acute symptoms over past 24 hours.  That may represent recurrent bout.   I do recommend evaluation in the ED. I called down and notified  ED physician of your presentation and they know you are on your way down.   Follow up here pas need post ED evaluation

## 2015-04-22 ENCOUNTER — Ambulatory Visit: Payer: Self-pay | Admitting: Physician Assistant

## 2015-04-26 ENCOUNTER — Ambulatory Visit: Payer: Self-pay | Admitting: Physician Assistant

## 2015-04-27 ENCOUNTER — Other Ambulatory Visit: Payer: Self-pay | Admitting: Physician Assistant

## 2015-04-27 ENCOUNTER — Encounter: Payer: Self-pay | Admitting: Physician Assistant

## 2015-04-27 ENCOUNTER — Ambulatory Visit (INDEPENDENT_AMBULATORY_CARE_PROVIDER_SITE_OTHER): Payer: Self-pay | Admitting: Physician Assistant

## 2015-04-27 VITALS — BP 141/83 | HR 89 | Temp 98.1°F | Resp 18 | Wt 218.4 lb

## 2015-04-27 DIAGNOSIS — J00 Acute nasopharyngitis [common cold]: Secondary | ICD-10-CM

## 2015-04-27 DIAGNOSIS — IMO0001 Reserved for inherently not codable concepts without codable children: Secondary | ICD-10-CM

## 2015-04-27 DIAGNOSIS — B9689 Other specified bacterial agents as the cause of diseases classified elsewhere: Secondary | ICD-10-CM

## 2015-04-27 DIAGNOSIS — E781 Pure hyperglyceridemia: Secondary | ICD-10-CM

## 2015-04-27 DIAGNOSIS — Z794 Long term (current) use of insulin: Principal | ICD-10-CM

## 2015-04-27 DIAGNOSIS — K859 Acute pancreatitis without necrosis or infection, unspecified: Secondary | ICD-10-CM

## 2015-04-27 DIAGNOSIS — E1165 Type 2 diabetes mellitus with hyperglycemia: Secondary | ICD-10-CM

## 2015-04-27 DIAGNOSIS — J208 Acute bronchitis due to other specified organisms: Secondary | ICD-10-CM

## 2015-04-27 LAB — LIPID PANEL
CHOLESTEROL: 226 mg/dL — AB (ref 0–200)
HDL: 22.6 mg/dL — ABNORMAL LOW (ref >39.00)
Total CHOL/HDL Ratio: 10
Triglycerides: 1710 mg/dL — ABNORMAL HIGH (ref 0.0–149.0)

## 2015-04-27 LAB — LDL CHOLESTEROL, DIRECT: LDL DIRECT: 27 mg/dL

## 2015-04-27 LAB — COMPREHENSIVE METABOLIC PANEL
ALK PHOS: 95 U/L (ref 39–117)
ALT: 13 U/L (ref 0–35)
AST: 10 U/L (ref 0–37)
Albumin: 3.7 g/dL (ref 3.5–5.2)
BILIRUBIN TOTAL: 0.4 mg/dL (ref 0.2–1.2)
BUN: 9 mg/dL (ref 6–23)
CALCIUM: 9 mg/dL (ref 8.4–10.5)
CO2: 27 mEq/L (ref 19–32)
Chloride: 94 mEq/L — ABNORMAL LOW (ref 96–112)
Creatinine, Ser: 0.68 mg/dL (ref 0.40–1.20)
GFR: 99.18 mL/min (ref >60.00)
GLUCOSE: 266 mg/dL — AB (ref 70–99)
Potassium: 4.1 mEq/L (ref 3.5–5.1)
Sodium: 130 mEq/L — ABNORMAL LOW (ref 135–145)
TOTAL PROTEIN: 7.2 g/dL (ref 6.0–8.3)

## 2015-04-27 LAB — LIPASE: Lipase: 72 U/L — ABNORMAL HIGH (ref 11.0–59.0)

## 2015-04-27 LAB — HEMOGLOBIN A1C: Hgb A1c MFr Bld: 9.7 % — ABNORMAL HIGH (ref 4.6–6.5)

## 2015-04-27 MED ORDER — HYDROCOD POLST-CPM POLST ER 10-8 MG/5ML PO SUER: 5.0000 mL | Freq: Two times a day (BID) | ORAL | Status: DC | PRN

## 2015-04-27 MED ORDER — AZITHROMYCIN 250 MG PO TABS: ORAL_TABLET | ORAL | Status: DC

## 2015-04-27 MED ORDER — ATORVASTATIN CALCIUM 40 MG PO TABS: 40.0000 mg | ORAL_TABLET | Freq: Every day | ORAL | Status: DC

## 2015-04-27 NOTE — Progress Notes (Signed)
Patient presents to clinic today c/o for ER follow-up of acute pancreatitis. At time of ER visit, admission was recommended but patient refused. Patient left to treat symptoms at home with NPO followed by dietary changes. Denies residual abdominal pain, nausea or vomiting, fever, chills or fatigue. Is taking fenofibrate and zocor daily as directed. Is taking Metformin twice daily and Levemir 12 units QPM with fasting sugars averaging 130-170 most days.   Patient also c/o > 1 week of sinus pressure, sinus pain, ear pain and cough productive of green sputum. Denies fever, SOB or chest pain. Some tenderness in ribs with coughing.  Is no longer smoking since last ER visit.   Past Medical History  Diagnosis Date  . Pancreatitis, acute   . Diabetes mellitus type II, controlled ([place])   . Hyperlipidemia   . Chronic ear infection     Current Outpatient Prescriptions on File Prior to Visit  Medication Sig Dispense Refill  . cyclobenzaprine (FLEXERIL) 10 MG tablet Take 1 tablet (10 mg total) by mouth 3 (three) times daily as needed for muscle spasms. 30 tablet 0  . fenofibrate 160 MG tablet TAKE ONE TABLET BY MOUTH ONCE DAILY 30 tablet 0  . fluticasone (FLONASE) 50 MCG/ACT nasal spray Place 2 sprays into both nostrils daily. 16 g 6  . glucose blood test strip Use as instructed 100 each 12  . Insulin Detemir (LEVEMIR) 100 UNIT/ML Pen Inject 12 Units into the skin daily at 10 pm. 15 mL 2  . Insulin Pen Needle (1ST CHOICE PEN NEEDLES) 31G X 6 MM MISC For insulin injections as directed 100 each 0  . Lancets (FREESTYLE) lancets Use as instructed 100 each 12  . lisinopril (PRINIVIL,ZESTRIL) 10 MG tablet Take 1 tablet (10 mg total) by mouth daily. 30 tablet 2  . metFORMIN (GLUCOPHAGE) 500 MG tablet TAKE ONE TABLET BY MOUTH TWICE DAILY WITH MEALS 60 tablet 5  . promethazine (PHENERGAN) 25 MG tablet Take 1 tablet (25 mg total) by mouth every 6 (six) hours as needed for nausea or vomiting. 15 tablet 0  .  simvastatin (ZOCOR) 40 MG tablet TAKE ONE TABLET BY MOUTH ONCE DAILY AT  6PM 90 tablet 1   No current facility-administered medications on file prior to visit.    Allergies  Allergen Reactions  . Novocain [Procaine] Nausea And Vomiting    Family History  Problem Relation Age of Onset  . Heart attack Father 67    Deceased  . Diabetes Father   . Diabetes Paternal Uncle   . Diabetes Mother   . Hypertension Mother     Living  . Alzheimer's disease Maternal Grandfather   . Healthy Brother   . Healthy Daughter     Social History   Social History  . Marital Status: Married    Spouse Name: N/A  . Number of Children: N/A  . Years of Education: N/A   Social History Main Topics  . Smoking status: Current Every Day Smoker  . Smokeless tobacco: None     Comment: Pt has not smoked in 1.5 Wks  . Alcohol Use: No  . Drug Use: No  . Sexual Activity: Not Asked   Other Topics Concern  . None   Social History Narrative   Review of Systems - See HPI.  All other ROS are negative.  BP 141/83 mmHg  Pulse 89  Temp(Src) 98.1 F (36.7 C) (Oral)  Resp 18  Wt 218 lb 6 oz (99.054 kg)  SpO2 96%  LMP 04/06/2015  Physical Exam  Constitutional: She is oriented to person, place, and time and well-developed, well-nourished, and in no distress.  HENT:  Head: Normocephalic and atraumatic.  Nose: Nose normal.  Mouth/Throat: Oropharynx is clear and moist. No oropharyngeal exudate.  L TM erythematous and bulging  Eyes: Conjunctivae are normal.  Neck: Neck supple.  Cardiovascular: Normal rate, regular rhythm, normal heart sounds and intact distal pulses.   Pulmonary/Chest: Effort normal and breath sounds normal. No respiratory distress. She has no wheezes. She has no rales. She exhibits no tenderness.  Neurological: She is alert and oriented to person, place, and time.  Skin: Skin is warm and dry. No rash noted.  Psychiatric: Affect normal.  Vitals reviewed.  Diabetic Foot Exam - Simple    Simple Foot Form  Diabetic Foot exam was performed with the following findings:  Yes 04/27/2015  7:35 AM  Visual Inspection  No deformities, no ulcerations, no other skin breakdown bilaterally:  Yes  Sensation Testing  Intact to touch and monofilament testing bilaterally:  Yes  Pulse Check  Posterior Tibialis and Dorsalis pulse intact bilaterally:  Yes  Comments    33 Recent Results (from the past 2160 hour(s))  Urinalysis, Routine w reflex microscopic (not at [facility])     Status: Abnormal   Collection Time: 04/15/15  4:45 PM  Result Value Ref Range   Color, Urine AMBER (A) YELLOW    Comment: BIOCHEMICALS MAY BE AFFECTED BY COLOR   APPearance CLOUDY (A) CLEAR   Specific Gravity, Urine 1.027 1.005 - 1.030   pH 6.0 5.0 - 8.0   Glucose, UA >1000 (A) NEGATIVE mg/dL   Hgb urine dipstick MODERATE (A) NEGATIVE   Bilirubin Urine SMALL (A) NEGATIVE   Ketones, ur 15 (A) NEGATIVE mg/dL   Protein, ur >300 (A) NEGATIVE mg/dL   Urobilinogen, UA 1.0 0.0 - 1.0 mg/dL   Nitrite NEGATIVE NEGATIVE   Leukocytes, UA NEGATIVE NEGATIVE  Pregnancy, urine     Status: None   Collection Time: 04/15/15  4:45 PM  Result Value Ref Range   Preg Test, Ur NEGATIVE NEGATIVE    Comment:        THE SENSITIVITY OF THIS METHODOLOGY IS >20 mIU/mL.   Urine microscopic-add on     Status: Abnormal   Collection Time: 04/15/15  4:45 PM  Result Value Ref Range   Squamous Epithelial / LPF RARE RARE   WBC, UA 0-2 <3 WBC/hpf   RBC / HPF 3-6 <3 RBC/hpf   Bacteria, UA FEW (A) RARE   Urine-Other MUCOUS PRESENT     Comment: AMORPHOUS URATES/PHOSPHATES  Comprehensive metabolic panel     Status: Abnormal   Collection Time: 04/15/15  5:00 PM  Result Value Ref Range   Sodium 128 (L) 135 - 145 mmol/L   Potassium 3.6 3.5 - 5.1 mmol/L   Chloride 97 (L) 101 - 111 mmol/L   CO2 21 (L) 22 - 32 mmol/L   Glucose, Bld 250 (H) 65 - 99 mg/dL   BUN 10 6 - 20 mg/dL   Creatinine, Ser 0.57 0.44 - 1.00 mg/dL   Calcium 8.7 (L) 8.9 -  10.3 mg/dL   Total Protein 7.1 6.5 - 8.1 g/dL   Albumin 3.4 (L) 3.5 - 5.0 g/dL   AST 23 15 - 41 U/L   ALT 19 14 - 54 U/L   Alkaline Phosphatase 87 38 - 126 U/L   Total Bilirubin 0.9 0.3 - 1.2 mg/dL   GFR calc non Af Amer >  60 >60 mL/min   GFR calc Af Amer >60 >60 mL/min    Comment: (NOTE) The eGFR has been calculated using the CKD EPI equation. This calculation has not been validated in all clinical situations. eGFR's persistently <60 mL/min signify possible Chronic Kidney Disease.    Anion gap 10 5 - 15  Lipase, blood     Status: Abnormal   Collection Time: 04/15/15  5:00 PM  Result Value Ref Range   Lipase 306 (H) 22 - 51 U/L  CBC with Differential     Status: Abnormal   Collection Time: 04/15/15  5:00 PM  Result Value Ref Range   WBC 15.2 (H) 4.0 - 10.5 K/uL   RBC 5.04 3.87 - 5.11 MIL/uL   Hemoglobin 15.1 (H) 12.0 - 15.0 g/dL   HCT 42.8 36.0 - 46.0 %   MCV 84.9 78.0 - 100.0 fL   MCH 30.0 26.0 - 34.0 pg   MCHC 35.3 30.0 - 36.0 g/dL   RDW 15.4 11.5 - 15.5 %   Platelets 385 150 - 400 K/uL   Neutrophils Relative % 86 %   Neutro Abs 13.2 (H) 1.7 - 7.7 K/uL   Lymphocytes Relative 8 %   Lymphs Abs 1.1 0.7 - 4.0 K/uL   Monocytes Relative 6 %   Monocytes Absolute 0.9 0.1 - 1.0 K/uL   Eosinophils Relative 0 %   Eosinophils Absolute 0.1 0.0 - 0.7 K/uL   Basophils Relative 0 %   Basophils Absolute 0.0 0.0 - 0.1 K/uL    Assessment/Plan: Type II diabetes mellitus, uncontrolled Will repeat glucose and A1C today. Continue Metformin as directed. Increase PM Levemir by 2 units every 3-4 nights until goal of 100-120 fasting CBG is reached. Continue diet and exercise. Flu shot documented (Had at [facility]). Diabetic foot exam performed today and within normal limits.  Hypertriglyceridemia Will repeat lipid panel today. Continue diet. Continue Tricor as directed. If TGL still elevated will switch Simvastatin to Atorvastatin.   Acute pancreatitis Resolved clinically. Will repeat Lipase and  CMP today. Will alter regimen to get sugar and TGL to goal.  Acute bacterial bronchitis Rx Azithromycin.  Increase fluids.  Rest.  Saline nasal spray.  Probiotic.  Mucinex as directed.  Humidifier in bedroom. Rx Tussionex for cough.  Call or return to clinic if symptoms are not improving.

## 2015-04-27 NOTE — Assessment & Plan Note (Signed)
Will repeat lipid panel today. Continue diet. Continue Tricor as directed. If TGL still elevated will switch Simvastatin to Atorvastatin.

## 2015-04-27 NOTE — Assessment & Plan Note (Signed)
Resolved clinically. Will repeat Lipase and CMP today. Will alter regimen to get sugar and TGL to goal.

## 2015-04-27 NOTE — Assessment & Plan Note (Signed)
Will repeat glucose and A1C today. Continue Metformin as directed. Increase PM Levemir by 2 units every 3-4 nights until goal of 100-120 fasting CBG is reached. Continue diet and exercise. Flu shot documented (Had [facility]). Diabetic foot exam performed today and within normal limits.

## 2015-04-27 NOTE — Progress Notes (Signed)
Pre visit review using our clinic review tool, if applicable. No additional management support is needed unless otherwise documented below in the visit note/SLS  

## 2015-04-27 NOTE — Assessment & Plan Note (Signed)
Rx Azithromycin.  Increase fluids.  Rest.  Saline nasal spray.  Probiotic.  Mucinex as directed.  Humidifier in bedroom. Rx Tussionex for cough.  Call or return to clinic if symptoms are not improving.

## 2015-04-27 NOTE — Patient Instructions (Signed)
Please go to the lab for blood work. I will call you with your results. Increase Levemir by 2 units nightly until fasting sugars are averaging 100-130. Continue Metformin as directed and continue good diet. We will likely be switching your Simvastatin for Atorvastatin in TGL are not within normal limits.   Take antibiotic (Azithromycin) as directed.  Increase fluids.  Get plenty of rest. Use Mucinex for congestion. Use Tussionex as directed. Take a daily probiotic (I recommend Align or Culturelle, but even Activia Yogurt may be beneficial).  A humidifier placed in the bedroom may offer some relief for a dry, scratchy throat of nasal irritation.  Read information below on acute bronchitis. Please call or return to clinic if symptoms are not improving.  Acute Bronchitis Bronchitis is when the airways that extend from the windpipe into the lungs get red, puffy, and painful (inflamed). Bronchitis often causes thick spit (mucus) to develop. This leads to a cough. A cough is the most common symptom of bronchitis. In acute bronchitis, the condition usually begins suddenly and goes away over time (usually in 2 weeks). Smoking, allergies, and asthma can make bronchitis worse. Repeated episodes of bronchitis may cause more lung problems.  HOME CARE  Rest.  Drink enough fluids to keep your pee (urine) clear or pale yellow (unless you need to limit fluids as told by your doctor).  Only take over-the-counter or prescription medicines as told by your doctor.  Avoid smoking and secondhand smoke. These can make bronchitis worse. If you are a smoker, think about using nicotine gum or skin patches. Quitting smoking will help your lungs heal faster.  Reduce the chance of getting bronchitis again by:  Washing your hands often.  Avoiding people with cold symptoms.  Trying not to touch your hands to your mouth, nose, or eyes.  Follow up with your doctor as told.  GET HELP IF: Your symptoms do not improve  after 1 week of treatment. Symptoms include:  Cough.  Fever.  Coughing up thick spit.  Body aches.  Chest congestion.  Chills.  Shortness of breath.  Sore throat.  GET HELP RIGHT AWAY IF:   You have an increased fever.  You have chills.  You have severe shortness of breath.  You have bloody thick spit (sputum).  You throw up (vomit) often.  You lose too much body fluid (dehydration).  You have a severe headache.  You faint.  MAKE SURE YOU:   Understand these instructions.  Will watch your condition.  Will get help right away if you are not doing well or get worse. Document Released: 12/19/2007 Document Revised: 03/04/2013 Document Reviewed: 12/23/2012 [facility] Patient Information 2015 [place], [place]. This information is not intended to replace advice given to you by your health care provider. Make sure you discuss any questions you have with your health care provider.

## 2015-06-16 ENCOUNTER — Other Ambulatory Visit: Payer: Self-pay | Admitting: Physician Assistant

## 2015-07-04 ENCOUNTER — Inpatient Hospital Stay (HOSPITAL_BASED_OUTPATIENT_CLINIC_OR_DEPARTMENT_OTHER)
Admission: EM | Admit: 2015-07-04 | Discharge: 2015-07-07 | DRG: 440 | Disposition: A | Discharge status: Home / Self Care | Payer: Self-pay | Attending: Internal Medicine | Admitting: Internal Medicine

## 2015-07-04 ENCOUNTER — Encounter (HOSPITAL_BASED_OUTPATIENT_CLINIC_OR_DEPARTMENT_OTHER): Payer: Self-pay | Admitting: *Deleted

## 2015-07-04 ENCOUNTER — Emergency Department (HOSPITAL_BASED_OUTPATIENT_CLINIC_OR_DEPARTMENT_OTHER): Payer: Self-pay

## 2015-07-04 DIAGNOSIS — Z833 Family history of diabetes mellitus: Secondary | ICD-10-CM

## 2015-07-04 DIAGNOSIS — E781 Pure hyperglyceridemia: Secondary | ICD-10-CM | POA: Diagnosis present

## 2015-07-04 DIAGNOSIS — Z8719 Personal history of other diseases of the digestive system: Secondary | ICD-10-CM

## 2015-07-04 DIAGNOSIS — E119 Type 2 diabetes mellitus without complications: Secondary | ICD-10-CM | POA: Diagnosis present

## 2015-07-04 DIAGNOSIS — Z820 Family history of epilepsy and other diseases of the nervous system: Secondary | ICD-10-CM

## 2015-07-04 DIAGNOSIS — Z8249 Family history of ischemic heart disease and other diseases of the circulatory system: Secondary | ICD-10-CM

## 2015-07-04 DIAGNOSIS — E1165 Type 2 diabetes mellitus with hyperglycemia: Secondary | ICD-10-CM | POA: Diagnosis present

## 2015-07-04 DIAGNOSIS — K859 Acute pancreatitis without necrosis or infection, unspecified: Principal | ICD-10-CM | POA: Diagnosis present

## 2015-07-04 DIAGNOSIS — Z7984 Long term (current) use of oral hypoglycemic drugs: Secondary | ICD-10-CM

## 2015-07-04 DIAGNOSIS — E860 Dehydration: Secondary | ICD-10-CM | POA: Diagnosis present

## 2015-07-04 DIAGNOSIS — Z794 Long term (current) use of insulin: Secondary | ICD-10-CM

## 2015-07-04 DIAGNOSIS — F17200 Nicotine dependence, unspecified, uncomplicated: Secondary | ICD-10-CM | POA: Diagnosis present

## 2015-07-04 DIAGNOSIS — IMO0002 Reserved for concepts with insufficient information to code with codable children: Secondary | ICD-10-CM | POA: Diagnosis present

## 2015-07-04 DIAGNOSIS — Z9111 Patient's noncompliance with dietary regimen: Secondary | ICD-10-CM

## 2015-07-04 DIAGNOSIS — R109 Unspecified abdominal pain: Secondary | ICD-10-CM

## 2015-07-04 LAB — HEPATIC FUNCTION PANEL
ALBUMIN: 3.4 g/dL — AB (ref 3.5–5.0)
ALK PHOS: 100 U/L (ref 38–126)
ALT: 24 U/L (ref 14–54)
AST: 27 U/L (ref 15–41)
BILIRUBIN INDIRECT: 1 mg/dL — AB (ref 0.3–0.9)
Bilirubin, Direct: 0.3 mg/dL (ref 0.1–0.5)
TOTAL PROTEIN: 6.7 g/dL (ref 6.5–8.1)
Total Bilirubin: 1.3 mg/dL — ABNORMAL HIGH (ref 0.3–1.2)

## 2015-07-04 LAB — CBC WITH DIFFERENTIAL/PLATELET
BASOS ABS: 0.1 10*3/uL (ref 0.0–0.1)
BASOS PCT: 1 %
Band Neutrophils: 1 %
EOS ABS: 0.1 10*3/uL (ref 0.0–0.7)
EOS PCT: 1 %
HCT: 42.5 % (ref 36.0–46.0)
Hemoglobin: 15.1 g/dL — ABNORMAL HIGH (ref 12.0–15.0)
Lymphocytes Relative: 13 %
Lymphs Abs: 1.6 10*3/uL (ref 0.7–4.0)
MCH: 29.4 pg (ref 26.0–34.0)
MCHC: 35.5 g/dL (ref 30.0–36.0)
MCV: 82.7 fL (ref 78.0–100.0)
MONO ABS: 0.1 10*3/uL (ref 0.1–1.0)
MONOS PCT: 1 %
Neutro Abs: 10.7 10*3/uL — ABNORMAL HIGH (ref 1.7–7.7)
Neutrophils Relative %: 83 %
PLATELETS: 366 10*3/uL (ref 150–400)
RBC: 5.14 MIL/uL — AB (ref 3.87–5.11)
RDW: 15.1 % (ref 11.5–15.5)
WBC: 12.6 10*3/uL — ABNORMAL HIGH (ref 4.0–10.5)

## 2015-07-04 LAB — CBG MONITORING, ED
GLUCOSE-CAPILLARY: 329 mg/dL — AB (ref 65–99)
GLUCOSE-CAPILLARY: 281 mg/dL — AB (ref 65–99)
GLUCOSE-CAPILLARY: 276 mg/dL — AB (ref 65–99)

## 2015-07-04 LAB — BASIC METABOLIC PANEL
Anion gap: 14 (ref 5–15)
BUN: 12 mg/dL (ref 6–20)
CO2: 23 mmol/L (ref 22–32)
CREATININE: 0.78 mg/dL (ref 0.44–1.00)
Calcium: 9 mg/dL (ref 8.9–10.3)
Chloride: 100 mmol/L — ABNORMAL LOW (ref 101–111)
GFR calc Af Amer: >60 mL/min (ref >60)
GLUCOSE: 340 mg/dL — AB (ref 65–99)
POTASSIUM: 4.3 mmol/L (ref 3.5–5.1)
SODIUM: 137 mmol/L (ref 135–145)

## 2015-07-04 LAB — URINALYSIS, ROUTINE W REFLEX MICROSCOPIC
Ketones, ur: 15 mg/dL — AB
Leukocytes, UA: NEGATIVE
Nitrite: NEGATIVE
Specific Gravity, Urine: >1.046 — ABNORMAL HIGH (ref 1.005–1.030)
pH: 5.5 (ref 5.0–8.0)

## 2015-07-04 LAB — URINE MICROSCOPIC-ADD ON

## 2015-07-04 LAB — LIPASE, BLOOD: Lipase: 573 U/L — ABNORMAL HIGH (ref 11–51)

## 2015-07-04 LAB — AMYLASE: Amylase: 247 U/L — ABNORMAL HIGH (ref 28–100)

## 2015-07-04 MED ORDER — SODIUM CHLORIDE 0.9 % IV BOLUS (SEPSIS)
1000.0000 mL | Freq: Once | INTRAVENOUS | Status: AC
  Administered 2015-07-04: 1000 mL via INTRAVENOUS

## 2015-07-04 MED ORDER — PROMETHAZINE HCL 25 MG/ML IJ SOLN
12.5000 mg | Freq: Once | INTRAMUSCULAR | Status: AC
  Administered 2015-07-04: 12.5 mg via INTRAVENOUS
  Filled 2015-07-04: qty 1

## 2015-07-04 MED ORDER — ONDANSETRON HCL 4 MG/2ML IJ SOLN
4.0000 mg | Freq: Once | INTRAMUSCULAR | Status: AC
  Administered 2015-07-04: 4 mg via INTRAVENOUS
  Filled 2015-07-04: qty 2

## 2015-07-04 MED ORDER — OXYCODONE-ACETAMINOPHEN 5-325 MG PO TABS: 1.0000 | ORAL_TABLET | Freq: Four times a day (QID) | ORAL | Status: DC | PRN

## 2015-07-04 MED ORDER — MORPHINE SULFATE (PF) 4 MG/ML IV SOLN
4.0000 mg | Freq: Once | INTRAVENOUS | Status: AC
  Administered 2015-07-04: 4 mg via INTRAVENOUS
  Filled 2015-07-04: qty 1

## 2015-07-04 MED ORDER — OXYCODONE-ACETAMINOPHEN 5-325 MG PO TABS
2.0000 | ORAL_TABLET | Freq: Once | ORAL | Status: AC
Start: 2015-07-04 — End: 2015-07-04
  Administered 2015-07-04: 2 via ORAL
  Filled 2015-07-04: qty 2

## 2015-07-04 MED ORDER — PROMETHAZINE HCL 25 MG PO TABS: 25.0000 mg | ORAL_TABLET | Freq: Four times a day (QID) | ORAL | Status: DC | PRN

## 2015-07-04 MED ORDER — HYDROMORPHONE HCL 1 MG/ML IJ SOLN
1.0000 mg | Freq: Once | INTRAMUSCULAR | Status: AC
  Administered 2015-07-04: 1 mg via INTRAVENOUS
  Filled 2015-07-04: qty 1

## 2015-07-04 MED ORDER — ONDANSETRON HCL 4 MG/2ML IJ SOLN
4.0000 mg | INTRAMUSCULAR | Status: AC
  Administered 2015-07-04: 4 mg via INTRAVENOUS
  Filled 2015-07-04: qty 2

## 2015-07-04 MED ORDER — DEXTROSE 5 % IV SOLN: 1.0000 g | Freq: Once | INTRAVENOUS | Status: DC

## 2015-07-04 NOTE — Discharge Instructions (Signed)
Acute Pancreatitis Acute pancreatitis is a disease in which the pancreas becomes suddenly inflamed. The pancreas is a large gland located behind your stomach. The pancreas produces enzymes that help digest food. The pancreas also releases the hormones glucagon and insulin that help regulate blood sugar. Damage to the pancreas occurs when the digestive enzymes from the pancreas are activated and begin attacking the pancreas before being released into the intestine. Most acute attacks last a couple of days and can cause serious complications. Some people become dehydrated and develop low blood pressure. In severe cases, bleeding into the pancreas can lead to shock and can be life-threatening. The lungs, heart, and kidneys may fail. CAUSES  Pancreatitis can happen to anyone. In some cases, the cause is unknown. Most cases are caused by:  Alcohol abuse.  Gallstones. Other less common causes are:  Certain medicines.  Exposure to certain chemicals.  Infection.  Damage caused by an accident (trauma).  Abdominal surgery. SYMPTOMS   Pain in the upper abdomen that may radiate to the back.  Tenderness and swelling of the abdomen.  Nausea and vomiting. DIAGNOSIS  Your caregiver will perform a physical exam. Blood and stool tests may be done to confirm the diagnosis. Imaging tests may also be done, such as X-rays, CT scans, or an ultrasound of the abdomen. TREATMENT  Treatment usually requires a stay in the hospital. Treatment may include:  Pain medicine.  Fluid replacement through an intravenous line (IV).  Placing a tube in the stomach to remove stomach contents and control vomiting.  Not eating for 3 or 4 days. This gives your pancreas a rest, because enzymes are not being produced that can cause further damage.  Antibiotic medicines if your condition is caused by an infection.  Surgery of the pancreas or gallbladder. HOME CARE INSTRUCTIONS   Follow the diet advised by your  caregiver. This may involve avoiding alcohol and decreasing the amount of fat in your diet.  Eat smaller, more frequent meals. This reduces the amount of digestive juices the pancreas produces.  Drink enough fluids to keep your urine clear or pale yellow.  Only take over-the-counter or prescription medicines as directed by your caregiver.  Avoid drinking alcohol if it caused your condition.  Do not smoke.  Get plenty of rest.  Check your blood sugar at home as directed by your caregiver.  Keep all follow-up appointments as directed by your caregiver. SEEK MEDICAL CARE IF:   You do not recover as quickly as expected.  You develop new or worsening symptoms.  You have persistent pain, weakness, or nausea.  You recover and then have another episode of pain. SEEK IMMEDIATE MEDICAL CARE IF:   You are unable to eat or keep fluids down.  Your pain becomes severe.  You have a fever or persistent symptoms for more than 2 to 3 days.  You have a fever and your symptoms suddenly get worse.  Your skin or the white part of your eyes turn yellow (jaundice).  You develop vomiting.  You feel dizzy, or you faint.  Your blood sugar is high (over 300 mg/dL). MAKE SURE YOU:   Understand these instructions.  Will watch your condition.  Will get help right away if you are not doing well or get worse.   This information is not intended to replace advice given to you by your health care provider. Make sure you discuss any questions you have with your health care provider.   Document Released: 07/02/2005 Document Revised: 01/01/2012  Document Reviewed: 10/11/2011 Elsevier Interactive Patient Education 2016 [place] Diet for Pancreatitis or Gallbladder Conditions A low-fat diet can be helpful if you have pancreatitis or a gallbladder condition. With these conditions, your pancreas and gallbladder have trouble digesting fats. A healthy eating plan with less fat will help  rest your pancreas and gallbladder and reduce your symptoms. WHAT DO I NEED TO KNOW ABOUT THIS DIET?  Eat a low-fat diet.  Reduce your fat intake to less than 20-30% of your total daily calories. This is less than 50-60 g of fat per day.  Remember that you need some fat in your diet. Ask your dietician what your daily goal should be.  Choose nonfat and low-fat healthy foods. Look for the words "nonfat," "low fat," or "fat free."  As a guide, look on the label and choose foods with less than 3 g of fat per serving. Eat only one serving.  Avoid alcohol.  Do not smoke. If you need help quitting, talk with your health care provider.  Eat small frequent meals instead of three large heavy meals. WHAT FOODS CAN I EAT? Grains Include healthy grains and starches such as potatoes, wheat bread, fiber-rich cereal, and brown rice. Choose whole grain options whenever possible. In adults, whole grains should account for 45-65% of your daily calories.  Fruits and Vegetables Eat plenty of fruits and vegetables. Fresh fruits and vegetables add fiber to your diet. Meats and Other Protein Sources Eat lean meat such as chicken and pork. Trim any fat off of meat before cooking it. Eggs, fish, and beans are other sources of protein. In adults, these foods should account for 10-35% of your daily calories. Dairy Choose low-fat milk and dairy options. Dairy includes fat and protein, as well as calcium.  Fats and Oils Limit high-fat foods such as fried foods, sweets, baked goods, sugary drinks.  Other Creamy sauces and condiments, such as mayonnaise, can add extra fat. Think about whether or not you need to use them, or use smaller amounts or low fat options. WHAT FOODS ARE NOT RECOMMENDED?  High fat foods, such as:  [employer].  Ice cream.  [place] toast.  Sweet rolls.  Pizza.  Cheese bread.  Foods covered with batter, butter, creamy sauces, or cheese.  Fried foods.  Sugary drinks and  desserts.  Foods that cause gas or bloating   This information is not intended to replace advice given to you by your health care provider. Make sure you discuss any questions you have with your health care provider.   Document Released: 07/07/2013 Document Reviewed: 07/07/2013 Elsevier Interactive Patient Education [employer].

## 2015-07-04 NOTE — ED Notes (Signed)
Pt requesting more pain medication.  PA notified. No new orders at this time.

## 2015-07-04 NOTE — ED Notes (Signed)
Blood redrawn for send out to [facility]

## 2015-07-04 NOTE — ED Notes (Signed)
Called main lab again, still waiting for lipase result.  MD notfiied

## 2015-07-04 NOTE — ED Notes (Signed)
Updated patient that lipase is back and MD will be in to speak with patient soon.  Pt requesting more pain medication but appears comfortable and in no distress.  Even upon entering room patient is sleeping.  But when awaken by this RN complains about pain.  Pt requesting to go home and be called prior to lipase results but explained she would need to sign out AMA form and patient requesting to speak with MD.  MD notified.

## 2015-07-04 NOTE — ED Notes (Signed)
Abdominal pain and vomiting. Hx of pancreatitis.

## 2015-07-04 NOTE — ED Provider Notes (Signed)
CSN: [id number]     Arrival date & time 07/04/15  1354 History   First MD Initiated Contact with Patient 07/04/15 1445     Chief Complaint  Patient presents with  . Abdominal Pain     (Consider location/radiation/quality/duration/timing/severity/associated sxs/prior Treatment) The history is provided by the patient.     Patient presents with abdominal pain, N, V with history of pancreatitis felt to be secondary to elevated triglycerides. She's had 3 episodes of pancreatitis in the past. Both were last year. She's had some increased pain across her upper abdomen for the last 2 days.It started this morning with associated nausea and vomiting, estimated 8x V,  and has been unable to keep anything down other than small amounts of water. Her emesis is nonbloody and nonbilious. She denies any change in bowel habits, last BM this am. She denies any known fevers. She states the pain is similar to her past episodes of pancreatitis. She had an ultrasound of her gallbladder last July with her first episode of pancreatitis which was negative for gallstones. She denies any alcohol use.  Patient is a 44 y.o. female presenting with abdominal pain. The history is provided by the patient.  Abdominal Pain Pain location: LUQ and epigastric Pain radiates to: Does not radiate Pain severity: Moderate (8/10) Onset quality: Gradual Duration: 1 day Timing: Constant Progression: Worsening Chronicity: Recurrent Associated symptoms: nausea and vomiting  Associated symptoms: no belching, no chest pain, no chills, no constipation, no cough, no diarrhea, no dysuria, no fatigue, no fever, no hematemesis, no hematochezia, no hematuria, no melena, no shortness of breath, no vaginal bleeding and no vaginal discharge  Nausea:   Severity: Severe Risk factors: obesity  Risk factors: no alcohol abuse, has not had multiple surgeries and no NSAID use   Past Medical History  Diagnosis Date  . Pancreatitis,  acute   . Diabetes mellitus type II, controlled ([place])   . Hyperlipidemia   . Chronic ear infection    Past Surgical History  Procedure Laterality Date  . Hernia repair    . Knee surgery    . Wisdom tooth extraction     Family History  Problem Relation Age of Onset  . Heart attack Father 41    Deceased  . Diabetes Father   . Diabetes Paternal Uncle   . Diabetes Mother   . Hypertension Mother     Living  . Alzheimer's disease Maternal Grandfather   . Healthy Brother   . Healthy Daughter    Social History  Substance Use Topics  . Smoking status: Current Every Day Smoker  . Smokeless tobacco: None     Comment: Pt has not smoked in 1.5 Wks  . Alcohol Use: No   OB History    No data available     Review of Systems  All other systems reviewed and are negative. Constitutional: Negative. Negative for fever, chills, diaphoresis and fatigue.  HENT: Negative.  Eyes: Negative.  Respiratory: Negative. Negative for cough and shortness of breath.  Cardiovascular: Negative. Negative for chest pain.  Gastrointestinal: Positive for nausea, vomiting and abdominal pain. Negative for diarrhea, constipation, melena, hematochezia, rectal pain and hematemesis.  Genitourinary: Negative for dysuria, hematuria, vaginal bleeding and vaginal discharge.  Musculoskeletal: Negative.  Skin: Negative.  Hematological: Negative.  Psychiatric/Behavioral: Negative.  All other systems reviewed and are negative.  Allergies  Novocain  Home Medications   Prior to Admission medications   Medication Sig Start Date End Date Taking? Authorizing Provider  atorvastatin (LIPITOR)  40 MG tablet Take 1 tablet (40 mg total) by mouth daily. 04/27/15   Brunetta Jeans, PA-C  azithromycin (ZITHROMAX) 250 MG tablet Take 2 tablets on Day 1. Then take 1 tablet daily 04/27/15   Brunetta Jeans, PA-C  chlorpheniramine-HYDROcodone [facility]) 10-8 MG/5ML SUER Take 5 mLs by mouth every 12  (twelve) hours as needed. 04/27/15   Brunetta Jeans, PA-C  cyclobenzaprine (FLEXERIL) 10 MG tablet Take 1 tablet (10 mg total) by mouth 3 (three) times daily as needed for muscle spasms. 10/25/14   Brunetta Jeans, PA-C  fenofibrate 160 MG tablet TAKE ONE TABLET BY MOUTH ONCE DAILY 06/16/15   Brunetta Jeans, PA-C  fluticasone [facility]) 50 MCG/ACT nasal spray Place 2 sprays into both nostrils daily. 09/07/14   Brunetta Jeans, PA-C  glucose blood test strip Use as instructed 02/08/14   Sheila Oats, MD  Insulin Detemir (LEVEMIR) 100 UNIT/ML Pen Inject 12 Units into the skin daily at 10 pm. 03/04/14   Brunetta Jeans, PA-C  Insulin Pen Needle (1ST CHOICE PEN NEEDLES) 31G X 6 MM MISC For insulin injections as directed 02/08/14   Sheila Oats, MD  Lancets (FREESTYLE) lancets Use as instructed 02/08/14   Sheila Oats, MD  lisinopril (PRINIVIL,ZESTRIL) 10 MG tablet Take 1 tablet (10 mg total) by mouth daily. 07/08/14   Brunetta Jeans, PA-C  lisinopril (PRINIVIL,ZESTRIL) 10 MG tablet TAKE ONE TABLET BY MOUTH ONCE DAILY 06/16/15   Brunetta Jeans, PA-C  metFORMIN (GLUCOPHAGE) 500 MG tablet TAKE ONE TABLET BY MOUTH TWICE DAILY WITH MEALS 01/19/15   Brunetta Jeans, PA-C  promethazine (PHENERGAN) 25 MG tablet Take 1 tablet (25 mg total) by mouth every 6 (six) hours as needed for nausea or vomiting. 04/15/15   Malvin Johns, MD   BP 144/87 mmHg  Pulse 84  Temp(Src) 97.9 F (36.6 C) (Oral)  Resp 21  SpO2 93%  LMP 07/04/2015 Physical Exam  Constitutional: She is oriented to person, place, and time. She appears well-developed and well-nourished. No distress.  HENT:  Head: Normocephalic and atraumatic.  Nose: Nose normal.  Mouth/Throat: Oropharynx is clear and moist. No oropharyngeal exudate.  Eyes: Conjunctivae and EOM are normal. Pupils are equal, round, and reactive to light. Right eye exhibits no discharge. Left eye exhibits no discharge. No scleral icterus.  Neck: Normal range of motion.  No JVD present. No tracheal deviation present. No thyromegaly present.  Cardiovascular: Normal rate, regular rhythm, normal heart sounds and intact distal pulses.  Exam reveals no gallop and no friction rub.   No murmur heard. Pulmonary/Chest: Effort normal and breath sounds normal. No respiratory distress. She has no wheezes. She has no rales. She exhibits no tenderness.  Abdominal: Soft. Bowel sounds are normal. She exhibits no distension and no mass. There is tenderness. There is no rebound and no guarding.  Epigastric and LUQ abdominal pain  Musculoskeletal: Normal range of motion. She exhibits no edema or tenderness.  Lymphadenopathy:    She has no cervical adenopathy.  Neurological: She is alert and oriented to person, place, and time. She has normal reflexes. No cranial nerve deficit. She exhibits normal muscle tone. Coordination normal.  Skin: Skin is warm and dry. No rash noted. She is not diaphoretic. No erythema. No pallor.  Psychiatric: She has a normal mood and affect. Her behavior is normal. Judgment and thought content normal.  Nursing note and vitals reviewed.   ED Course  Procedures (including critical care  time) Labs Review Labs Reviewed  URINALYSIS, ROUTINE W REFLEX MICROSCOPIC (NOT AT [facility]) - Abnormal; Notable for the following:    Color, Urine AMBER (*)    APPearance TURBID (*)    Specific Gravity, Urine >1.046 (*)    Glucose, UA >1000 (*)    Hgb urine dipstick SMALL (*)    Bilirubin Urine SMALL (*)    Ketones, ur 15 (*)    Protein, ur >300 (*)    All other components within normal limits  CBC WITH DIFFERENTIAL/PLATELET - Abnormal; Notable for the following:    WBC 12.6 (*)    RBC 5.14 (*)    Hemoglobin 15.1 (*)    Neutro Abs 10.7 (*)    All other components within normal limits  URINE MICROSCOPIC-ADD ON - Abnormal; Notable for the following:    Squamous Epithelial / LPF 0-5 (*)    Bacteria, UA FEW (*)    Crystals CA OXALATE CRYSTALS (*)    All other  components within normal limits  CBG MONITORING, ED - Abnormal; Notable for the following:    Glucose-Capillary 329 (*)    All other components within normal limits  BASIC METABOLIC PANEL  AMYLASE  LIPASE, BLOOD    Imaging Review No results found. I have personally reviewed and evaluated these images and lab results as part of my medical decision-making.   EKG Interpretation None      MDM   Final diagnoses:  None    PT with LUQ abdominal pain and N/V x 8 onset today 1.5L fluids with zofran, 4 mg morphine, and 1mg  dilaudid given, no active vomiting  Chemistry, lipase and amylase was transferred to [facility] ER, are currently pending. Patient is sipping on ice water for a fluid challenge. She was given an extra liter of fluid with a second Zofran dose and oral pain medications. Pending lab results patient will be discharged home with close follow-up with her PCP in 2-3 days  Pt was handed off to Dr. Rex Kras at the end of my shift pending results of remaining lab work.  Pt was sleeping comfortably, with family at bedside.    Delsa Grana, PA-C 07/14/15 0256  Delsa Grana, PA-C 07/14/15 0259  Sharlett Iles, MD 07/14/15 [phone]

## 2015-07-04 NOTE — ED Notes (Signed)
Called [facility] to check on lab results, reports its on the machine and will result within 20-30 minutes

## 2015-07-04 NOTE — ED Notes (Signed)
MD at bedside to speak to patient after RN being called in for patient actively vomiting. MD wanting EKG and will order phenergan since 2 doses of zofran have been given.

## 2015-07-04 NOTE — ED Notes (Signed)
Pt given water for a fluid po challenge.

## 2015-07-05 ENCOUNTER — Inpatient Hospital Stay (HOSPITAL_COMMUNITY): Payer: Self-pay

## 2015-07-05 ENCOUNTER — Encounter (HOSPITAL_COMMUNITY): Payer: Self-pay | Admitting: Internal Medicine

## 2015-07-05 DIAGNOSIS — E781 Pure hyperglyceridemia: Secondary | ICD-10-CM

## 2015-07-05 DIAGNOSIS — Z8719 Personal history of other diseases of the digestive system: Secondary | ICD-10-CM

## 2015-07-05 LAB — COMPREHENSIVE METABOLIC PANEL
ALK PHOS: 88 U/L (ref 38–126)
ALT: 17 U/L (ref 14–54)
ANION GAP: 11 (ref 5–15)
AST: 18 U/L (ref 15–41)
Albumin: 2.8 g/dL — ABNORMAL LOW (ref 3.5–5.0)
BILIRUBIN TOTAL: 1 mg/dL (ref 0.3–1.2)
BUN: 7 mg/dL (ref 6–20)
CALCIUM: 8.1 mg/dL — AB (ref 8.9–10.3)
CO2: 20 mmol/L — ABNORMAL LOW (ref 22–32)
Chloride: 104 mmol/L (ref 101–111)
Creatinine, Ser: 0.57 mg/dL (ref 0.44–1.00)
Glucose, Bld: 323 mg/dL — ABNORMAL HIGH (ref 65–99)
Potassium: 3.9 mmol/L (ref 3.5–5.1)
Sodium: 135 mmol/L (ref 135–145)
TOTAL PROTEIN: 6 g/dL — AB (ref 6.5–8.1)

## 2015-07-05 LAB — CBC WITH DIFFERENTIAL/PLATELET
BASOS ABS: 0 10*3/uL (ref 0.0–0.1)
BASOS PCT: 0 %
Eosinophils Absolute: 0 10*3/uL (ref 0.0–0.7)
Eosinophils Relative: 0 %
HEMATOCRIT: 43.1 % (ref 36.0–46.0)
HEMOGLOBIN: 15 g/dL (ref 12.0–15.0)
Lymphocytes Relative: 7 %
Lymphs Abs: 0.9 10*3/uL (ref 0.7–4.0)
MCH: 29.5 pg (ref 26.0–34.0)
MCHC: 34.8 g/dL (ref 30.0–36.0)
MCV: 84.8 fL (ref 78.0–100.0)
MONO ABS: 0.6 10*3/uL (ref 0.1–1.0)
Monocytes Relative: 4 %
NEUTROS ABS: 12.1 10*3/uL — AB (ref 1.7–7.7)
NEUTROS PCT: 89 %
Platelets: 337 10*3/uL (ref 150–400)
RBC: 5.08 MIL/uL (ref 3.87–5.11)
RDW: 15 % (ref 11.5–15.5)
WBC: 13.6 10*3/uL — ABNORMAL HIGH (ref 4.0–10.5)

## 2015-07-05 LAB — GLUCOSE, CAPILLARY
GLUCOSE-CAPILLARY: 313 mg/dL — AB (ref 65–99)
GLUCOSE-CAPILLARY: 250 mg/dL — AB (ref 65–99)
Glucose-Capillary: 314 mg/dL — ABNORMAL HIGH (ref 65–99)
Glucose-Capillary: 297 mg/dL — ABNORMAL HIGH (ref 65–99)
Glucose-Capillary: 250 mg/dL — ABNORMAL HIGH (ref 65–99)
Glucose-Capillary: 224 mg/dL — ABNORMAL HIGH (ref 65–99)

## 2015-07-05 LAB — PREGNANCY, URINE: PREG TEST UR: NEGATIVE

## 2015-07-05 LAB — LIPID PANEL
CHOLESTEROL: 394 mg/dL — AB (ref 0–200)
LDL Cholesterol: UNDETERMINED mg/dL (ref 0–99)
TRIGLYCERIDES: 4062 mg/dL — AB (ref <150)
VLDL: UNDETERMINED mg/dL (ref 0–40)

## 2015-07-05 LAB — TROPONIN I: Troponin I: <0.03 ng/mL (ref <0.031)

## 2015-07-05 MED ORDER — INSULIN DETEMIR 100 UNIT/ML ~~LOC~~ SOLN
10.0000 [IU] | Freq: Every day | SUBCUTANEOUS | Status: DC
  Administered 2015-07-05 – 2015-07-06 (×3): 10 [IU] via SUBCUTANEOUS
  Filled 2015-07-05 (×4): qty 0.1

## 2015-07-05 MED ORDER — INSULIN ASPART 100 UNIT/ML ~~LOC~~ SOLN
0.0000 [IU] | SUBCUTANEOUS | Status: DC
  Administered 2015-07-05: 7 [IU] via SUBCUTANEOUS
  Administered 2015-07-05: 3 [IU] via SUBCUTANEOUS
  Administered 2015-07-05: 7 [IU] via SUBCUTANEOUS
  Administered 2015-07-05: 5 [IU] via SUBCUTANEOUS
  Administered 2015-07-05 (×2): 3 [IU] via SUBCUTANEOUS
  Administered 2015-07-06 (×3): 2 [IU] via SUBCUTANEOUS
  Administered 2015-07-06 (×2): 3 [IU] via SUBCUTANEOUS
  Administered 2015-07-06: 2 [IU] via SUBCUTANEOUS
  Administered 2015-07-07 (×3): 1 [IU] via SUBCUTANEOUS
  Administered 2015-07-07: 2 [IU] via SUBCUTANEOUS

## 2015-07-05 MED ORDER — ONDANSETRON HCL 4 MG PO TABS: 4.0000 mg | ORAL_TABLET | Freq: Four times a day (QID) | ORAL | Status: DC | PRN

## 2015-07-05 MED ORDER — ONDANSETRON HCL 4 MG/2ML IJ SOLN
4.0000 mg | Freq: Four times a day (QID) | INTRAMUSCULAR | Status: DC | PRN
  Administered 2015-07-05: 4 mg via INTRAVENOUS
  Filled 2015-07-05: qty 2

## 2015-07-05 MED ORDER — ATORVASTATIN CALCIUM 40 MG PO TABS
40.0000 mg | ORAL_TABLET | Freq: Every day | ORAL | Status: DC
  Administered 2015-07-05 – 2015-07-07 (×3): 40 mg via ORAL
  Filled 2015-07-05 (×3): qty 1

## 2015-07-05 MED ORDER — HYDRALAZINE HCL 20 MG/ML IJ SOLN: 10.0000 mg | INTRAMUSCULAR | Status: DC | PRN

## 2015-07-05 MED ORDER — ACETAMINOPHEN 325 MG PO TABS: 650.0000 mg | ORAL_TABLET | Freq: Four times a day (QID) | ORAL | Status: DC | PRN

## 2015-07-05 MED ORDER — SODIUM CHLORIDE 0.9 % IV SOLN
INTRAVENOUS | Status: AC
  Administered 2015-07-05 (×2): via INTRAVENOUS

## 2015-07-05 MED ORDER — ACETAMINOPHEN 650 MG RE SUPP: 650.0000 mg | Freq: Four times a day (QID) | RECTAL | Status: DC | PRN

## 2015-07-05 MED ORDER — HYDROMORPHONE HCL 1 MG/ML IJ SOLN
1.0000 mg | INTRAMUSCULAR | Status: DC | PRN
  Administered 2015-07-05 – 2015-07-07 (×9): 1 mg via INTRAVENOUS
  Filled 2015-07-05 (×9): qty 1

## 2015-07-05 MED ORDER — FENOFIBRATE 160 MG PO TABS
160.0000 mg | ORAL_TABLET | Freq: Every day | ORAL | Status: DC
  Administered 2015-07-05 – 2015-07-07 (×3): 160 mg via ORAL
  Filled 2015-07-05 (×3): qty 1

## 2015-07-05 MED ORDER — FLUTICASONE PROPIONATE 50 MCG/ACT NA SUSP
2.0000 | Freq: Every day | NASAL | Status: DC
  Filled 2015-07-05: qty 16

## 2015-07-05 NOTE — Progress Notes (Signed)
[name] [id number] Code Status: Full  Admission Data: 07/05/2015 3:55 AM Attending Provider:  Reece Levy MD [id number], Sandria Manly, PA-C Consults/ Treatment Team:    Teresa Parks is a 44 y.o. female patient admitted from ED awake, alert - oriented  X 3 - no acute distress noted.  VSS - Blood pressure 160/84, pulse 90, temperature 98.2 F (36.8 C), temperature source Oral, resp. rate 18, height 5\' 5"  (1.651 m), weight 102 kg (224 lb 13.9 oz), last menstrual period 07/04/2015, SpO2 96 %.    IV in place, occlusive dsg intact without redness.  Orientation to room, and floor completed with information packet given to patient/family.  Patient declined safety video at this time.  Admission INP armband ID verified with patient/family, and in place.   SR up x 2, fall assessment complete, with patient and family able to verbalize understanding of risk associated with falls, and verbalized understanding to call nsg before up out of bed.  Call light within reach, patient able to voice, and demonstrate understanding.  Skin, clean-dry- intact without evidence of bruising, or skin tears.   No evidence of skin break down noted on exam.     Will cont to eval and treat per MD orders.  Vertell Limber, RN 07/05/2015

## 2015-07-05 NOTE — Care Management Note (Addendum)
Case Management Note  Patient Details  Name: Teresa Parks MRN: [id number] Date of Birth: 03/19/1971  Subjective/Objective:     Date: 07/05/15 Spoke with patient at the bedside along with spouse and mother.  Introduced self as Tourist information centre manager and explained role in discharge planning and how to be reached.  Verified patient lives in town, alone with spouse.  Expressed potential need for no other DME.  Verified patient anticipates to go home with family, at time of discharge and will have  part-time supervision by family at this time to best of their knowledge.  Patient denied needing help with their medication.  Patient drives  to MD appointments.  Verified patient has PCP [name] with [facility]. Patient has no insurance but she has a plan set up to make pmts to this MD monthly, she does not want a new pcp.    Plan: CM will continue to follow for discharge planning and [facility] resources.                Action/Plan:   Expected Discharge Date:                  Expected Discharge Plan:  Home/Self Care  In-House Referral:     Discharge planning Services  CM Consult  Post Acute Care Choice:    Choice offered to:     DME Arranged:    DME Agency:     HH Arranged:    HH Agency:     Status of Service:  In process, will continue to follow  Medicare Important Message Given:    Date Medicare IM Given:    Medicare IM give by:    Date Additional Medicare IM Given:    Additional Medicare Important Message give by:     If discussed at [place] of Stay Meetings, dates discussed:    Additional Comments:  Zenon Mayo, RN 07/05/2015, 11:20 AM

## 2015-07-05 NOTE — Progress Notes (Signed)
Inpatient Diabetes Program Recommendations  AACE/ADA: New Consensus Statement on Inpatient Glycemic Control (2015)  Target Ranges:  Prepandial:   less than 140 mg/dL      Peak postprandial:   less than 180 mg/dL (1-2 hours)      Critically ill patients:  140 - 180 mg/dL   Review of Glycemic Control Results for Teresa Parks, Teresa Parks (MRN [id number]) as of 07/05/2015 12:33  Ref. Range 07/04/2015 19:57 07/05/2015 01:16 07/05/2015 05:07 07/05/2015 07:58 07/05/2015 11:42  Glucose-Capillary Latest Ref Range: 65-99 mg/dL 276 (H) 314 (H) 313 (H) 297 (H) 250 (H)   DM 2 on levemir 12 units and metformin 500 mg per day. Present orders-levemir 10 units and sensitive correction q 4 hrs  Inpatient Diabetes Program Recommendations:    Please consider increasing correction scale to moderate q 4 hrs (NPO) May possibly need an increase in Levemir to 15 units or greater.  Thank you Rosita Kea, RN, MSN, CDE  Diabetes Inpatient Program Office: [phone] Pager: [phone] 8:00 am to 5:00 pm

## 2015-07-05 NOTE — Progress Notes (Signed)
Patient seen and examined, vital stable, reported less abdominal pain , now 6/10, denies n/v. Reviewed labs with significant elevated tirglyceride, ? Diet noncompliance? Consult to dietitian, continue npo, ivf, prn pain meds, repeat labs in am.

## 2015-07-05 NOTE — H&P (Signed)
[facility] History and Physical  ARVADA SCHWERTNER [id number] DOB: 1970/09/04 DOA: 07/04/2015  Referring physician: Patient was transferred from [facility] [place]. PCP: [name], PA-C  Specialists: None.  Chief Complaint: Abdominal pain.  HPI: Teresa Parks is a 44 y.o. female with history of recurrent pancreatitis from hypertriglyceridemia presents to the ER because of abdominal pain. Patient's abdominal pain started last morning. Patient states she went to bed after supper and started having abdominal pain in the morning. Patient had multiple episodes of nausea vomiting. Denies any diarrhea. There is mostly epigastric area with non-radiation. In the ER patient's lipase was found to be elevated and has been admitted for pancreatitis. Patient denies any chest pain or shortness of breath fever chills. Denies drinking alcohol. States she has been compliant with her medications.  Review of Systems: As presented in the history of presenting illness, rest negative.  Past Medical History  Diagnosis Date  . Pancreatitis, acute   . Diabetes mellitus type II, controlled ([place])   . Hyperlipidemia   . Chronic ear infection    Past Surgical History  Procedure Laterality Date  . Hernia repair    . Knee surgery    . Wisdom tooth extraction     Social History:  reports that she has been smoking.  She does not have any smokeless tobacco history on file. She reports that she does not drink alcohol or use illicit drugs. Where does patient live home. Can patient participate in ADLs? Yes.  Allergies  Allergen Reactions  . Novocain [Procaine] Nausea And Vomiting    Family History:  Family History  Problem Relation Age of Onset  . Heart attack Father 62    Deceased  . Diabetes Father   . Diabetes Paternal Uncle   . Diabetes Mother   . Hypertension Mother     Living  . Alzheimer's disease Maternal Grandfather   . Healthy Brother   . Healthy Daughter       Prior to  Admission medications   Medication Sig Start Date End Date Taking? Authorizing Provider  atorvastatin (LIPITOR) 40 MG tablet Take 1 tablet (40 mg total) by mouth daily. 04/27/15   Brunetta Jeans, PA-C  azithromycin (ZITHROMAX) 250 MG tablet Take 2 tablets on Day 1. Then take 1 tablet daily 04/27/15   Brunetta Jeans, PA-C  chlorpheniramine-HYDROcodone [facility]) 10-8 MG/5ML SUER Take 5 mLs by mouth every 12 (twelve) hours as needed. 04/27/15   Brunetta Jeans, PA-C  cyclobenzaprine (FLEXERIL) 10 MG tablet Take 1 tablet (10 mg total) by mouth 3 (three) times daily as needed for muscle spasms. 10/25/14   Brunetta Jeans, PA-C  fenofibrate 160 MG tablet TAKE ONE TABLET BY MOUTH ONCE DAILY 06/16/15   Brunetta Jeans, PA-C  fluticasone [facility]) 50 MCG/ACT nasal spray Place 2 sprays into both nostrils daily. 09/07/14   Brunetta Jeans, PA-C  glucose blood test strip Use as instructed 02/08/14   Sheila Oats, MD  Insulin Detemir (LEVEMIR) 100 UNIT/ML Pen Inject 12 Units into the skin daily at 10 pm. 03/04/14   Brunetta Jeans, PA-C  Insulin Pen Needle (1ST CHOICE PEN NEEDLES) 31G X 6 MM MISC For insulin injections as directed 02/08/14   Sheila Oats, MD  Lancets (FREESTYLE) lancets Use as instructed 02/08/14   Sheila Oats, MD  lisinopril (PRINIVIL,ZESTRIL) 10 MG tablet Take 1 tablet (10 mg total) by mouth daily. 07/08/14   Brunetta Jeans, PA-C  lisinopril (PRINIVIL,ZESTRIL) 10 MG tablet TAKE ONE TABLET BY MOUTH ONCE DAILY 06/16/15   Brunetta Jeans, PA-C  metFORMIN (GLUCOPHAGE) 500 MG tablet TAKE ONE TABLET BY MOUTH TWICE DAILY WITH MEALS 01/19/15   Brunetta Jeans, PA-C  oxyCODONE-acetaminophen (PERCOCET/ROXICET) 5-325 MG tablet Take 1-2 tablets by mouth every 6 (six) hours as needed for severe pain. 07/04/15   Delsa Grana, PA-C  promethazine (PHENERGAN) 25 MG tablet Take 1 tablet (25 mg total) by mouth every 6 (six) hours as needed for nausea or vomiting. 07/04/15   Delsa Grana, PA-C    Physical Exam: Filed Vitals:   07/04/15 1938 07/04/15 2101 07/04/15 2242 07/04/15 2246  BP: 149/86 138/98  160/84  Pulse: 88 96  90  Temp: 99.3 F (37.4 C)   98.2 F (36.8 C)  TempSrc: Oral   Oral  Resp: 20 20  18   Height:   5\' 5"  (1.651 m)   Weight:   102 kg (224 lb 13.9 oz)   SpO2: 93% 96%  96%     General:  Moderately built and nourished.  Eyes: Anicteric. No pallor.  ENT: No discharge from the ears eyes nose or mouth.  Neck: No mass felt. No neck rigidity.  Cardiovascular: S1 and S2 heard.  Respiratory: No rhonchi or crepitations.  Abdomen: Soft nontender bowel sounds present. No guarding or rigidity.  Skin: No rash.  Musculoskeletal: No edema.  Psychiatric: Appears normal.  Neurologic: Alert awake oriented to time place and person. Moves all extremities.  Labs on Admission:  Basic Metabolic Panel:  Recent Labs Lab 07/04/15 1455  NA 137  K 4.3  CL 100*  CO2 23  GLUCOSE 340*  BUN 12  CREATININE 0.78  CALCIUM 9.0   Liver Function Tests:  Recent Labs Lab 07/04/15 1953  AST 27  ALT 24  ALKPHOS 100  BILITOT 1.3*  PROT 6.7  ALBUMIN 3.4*    Recent Labs Lab 07/04/15 1455  LIPASE 573*  AMYLASE 247*   No results for input(s): AMMONIA in the last 168 hours. CBC:  Recent Labs Lab 07/04/15 1425  WBC 12.6*  NEUTROABS 10.7*  HGB 15.1*  HCT 42.5  MCV 82.7  PLT 366   Cardiac Enzymes: No results for input(s): CKTOTAL, CKMB, CKMBINDEX, TROPONINI in the last 168 hours.  BNP (last 3 results) No results for input(s): BNP in the last 8760 hours.  ProBNP (last 3 results) No results for input(s): PROBNP in the last 8760 hours.  CBG:  Recent Labs Lab 07/04/15 1412 07/04/15 1701 07/04/15 1957  GLUCAP 329* 281* 276*    Radiological Exams on Admission: Dg Abd 2 Views  07/04/2015  CLINICAL DATA:  Upper abdominal pain and vomiting. History of pancreatitis. EXAM: ABDOMEN - 2 VIEW COMPARISON:  None. FINDINGS: No  disproportionately dilated small bowel loops or air-fluid levels. Mild-to-moderate stool throughout the colon and rectum. No evidence of pneumatosis or pneumoperitoneum. No pathologic soft tissue calcifications. Visualized lung bases appear clear. IMPRESSION: Nonobstructive bowel gas pattern. Mild to moderate colorectal stool volume. Electronically Signed   By: Ilona Sorrel M.D.   On: 07/04/2015 16:56    EKG: Independently reviewed. Normal sinus rhythm.  Assessment/Plan Principal Problem:   Pancreatitis, acute Active Problems:   Acute pancreatitis   Type II diabetes mellitus, uncontrolled ([facility])   Hypertriglyceridemia   1. Acute pancreatitis - most likely secondary to history of hypertriglyceridemia. We will keep patient nothing by mouth except medications and continue patient's statins and fenofibrate. Check triglyceride levels. Since patient  states her pain has been more than usual we will check sonogram of the abdomen. Continue aggressive IV fluid hydration and pain relieving medications. 2. Diabetes mellitus type 2 uncontrolled - I have placed patient on Levemir 1 dose now and decreased her home dose due to patient being nothing by mouth. Closely follow CBGs and sliding scale coverage. 3. Hypertriglyceridemia - see #1.  I have reviewed patient's old charts and labs and personally reviewed patient's EKG and chest x-ray.   DVT Prophylaxis SCDs for now until results of sonogram available.  Code Status: Full code.  Family Communication: Discussed with patient.  Disposition Plan: Admit to inpatient.    Lizzet Hendley N. [facility] Pager [phone].  If 7PM-7AM, please contact night-coverage [email or web address] Password [email or web address] 07/05/2015, 1:07 AM

## 2015-07-05 NOTE — ED Provider Notes (Signed)
I received this patient in signout from [place]. Pt p/w abd pain and vomiting, hx of pancreatitis. We were awaiting lab results, which showed WBC 12.6, UA c/w dehydration, normal LFTs, Lipase 573 which was delayed in resulting because of patient's hypertriglyceridemia requiring multiple dilutions. Pt w/ ongoing vomiting. Given abnormal labs and ongoing sx, discussed admission, pt and family in agreement. Discussed w/ [facility], Dr. Eulas Post, and pt transferred for further care.  Sharlett Iles, MD 07/05/15 [phone]

## 2015-07-06 DIAGNOSIS — Z794 Long term (current) use of insulin: Secondary | ICD-10-CM

## 2015-07-06 DIAGNOSIS — E1165 Type 2 diabetes mellitus with hyperglycemia: Secondary | ICD-10-CM

## 2015-07-06 LAB — GLUCOSE, CAPILLARY
GLUCOSE-CAPILLARY: 156 mg/dL — AB (ref 65–99)
GLUCOSE-CAPILLARY: 180 mg/dL — AB (ref 65–99)
GLUCOSE-CAPILLARY: 206 mg/dL — AB (ref 65–99)
GLUCOSE-CAPILLARY: 210 mg/dL — AB (ref 65–99)
Glucose-Capillary: 153 mg/dL — ABNORMAL HIGH (ref 65–99)
Glucose-Capillary: 197 mg/dL — ABNORMAL HIGH (ref 65–99)

## 2015-07-06 LAB — BASIC METABOLIC PANEL
ANION GAP: 9 (ref 5–15)
BUN: 9 mg/dL (ref 6–20)
CALCIUM: 8.3 mg/dL — AB (ref 8.9–10.3)
CHLORIDE: 102 mmol/L (ref 101–111)
CO2: 22 mmol/L (ref 22–32)
Creatinine, Ser: 0.56 mg/dL (ref 0.44–1.00)
GFR calc non Af Amer: >60 mL/min (ref >60)
GLUCOSE: 199 mg/dL — AB (ref 65–99)
POTASSIUM: 3.4 mmol/L — AB (ref 3.5–5.1)
Sodium: 133 mmol/L — ABNORMAL LOW (ref 135–145)

## 2015-07-06 LAB — TSH: TSH: 1.377 u[IU]/mL (ref 0.350–4.500)

## 2015-07-06 LAB — MAGNESIUM: Magnesium: 1.8 mg/dL (ref 1.7–2.4)

## 2015-07-06 LAB — CBC
HEMATOCRIT: 38.9 % (ref 36.0–46.0)
HEMOGLOBIN: 13 g/dL (ref 12.0–15.0)
MCH: 29 pg (ref 26.0–34.0)
MCHC: 33.4 g/dL (ref 30.0–36.0)
MCV: 86.6 fL (ref 78.0–100.0)
Platelets: 304 10*3/uL (ref 150–400)
RBC: 4.49 MIL/uL (ref 3.87–5.11)
RDW: 15.8 % — ABNORMAL HIGH (ref 11.5–15.5)
WBC: 13 10*3/uL — AB (ref 4.0–10.5)

## 2015-07-06 LAB — LIPASE, BLOOD: LIPASE: 68 U/L — AB (ref 11–51)

## 2015-07-06 MED ORDER — POTASSIUM CHLORIDE CRYS ER 20 MEQ PO TBCR
40.0000 meq | EXTENDED_RELEASE_TABLET | Freq: Once | ORAL | Status: AC
  Administered 2015-07-06: 40 meq via ORAL
  Filled 2015-07-06: qty 2

## 2015-07-06 MED ORDER — SODIUM CHLORIDE 0.9 % IV SOLN: INTRAVENOUS | Status: AC

## 2015-07-06 NOTE — Progress Notes (Signed)
Pharmacy Education Note   Teresa Parks is an 44 y.o. female who presented to [facility] on 07/04/2015 with a chief complaint of  Chief Complaint  Patient presents with  . Abdominal Pain    The following medications were discussed with the patient: Lipitor, metformin, simvastatin, fenfibrate, lisinopril  Pain Control medications: []  Yes    [x]  No  Diabetes Medications: [x]  Yes    []  No  Heart Failure Medications: []  Yes    [x]  No  Anticoagulation Medications:  []  Yes    [x]  No  Antibiotics at discharge: []  Yes    [x]  No  Identified Patient Allergies:  Allergies  Allergen Reactions  . Novocain [Procaine] Nausea And Vomiting   Assessment: Pharmacy was asked by nursing staff to speak with patient about her medications to see if there have been any recent changes that might have contributed to current pancreatitis. The patient's full medication list was reviewed and she was interviewed about her use of herbal supplements and OTC medications. She does not endorse using any herbal supplements, multivitamins, or OTC medications. Of the medications the patient is currently taking, there are some case reports of metformin, lisinopril and atorvastatin causing pancreatitis, but the incidence is not very high and these medications are not strongly associated with pancreatitis.   The patient states she has had 2 "flares" of pancreatitis in the last year, but nothing requiring hospital admission until now. She states several months ago her PCP found that her triglycerides were high and changed her Lipitor to Zocor in an attempt to bring them back down. That is the only medication change she has had in the past year. No dose increases, decreases, or new medications. Her medication history was updated appropriately as it still listed Lipitor and not Zocor.    Based on the patient's history and and long use of the above medications without any issues, I believe it is unlikely that her pancreatitis is  medication related.  Thank you for this interesting consult and allowing pharmacy to be involved in this patient's care. Please contact us if there are further questions.   Time spent counseling patient: 15 minutes   Governor Specking, PharmD Clinical Pharmacy Resident Pager: [phone] 07/06/2015, 5:25 PM

## 2015-07-06 NOTE — Progress Notes (Signed)
[facility] PROGRESS NOTE  LAVETT LIPS [id number] DOB: 1970-11-03 DOA: 07/04/2015 PCP: [name], PA-C  Assessment/Plan: 1. Acute Pancreatitis -Teresa Parks is a 44 year old female with a history of hypertriglyceridemia who presented to the emergent department on 07/05/2015 with complaints of abdominal pain. Lab work revealed an elevated lipase level of 573. She was admitted for acute pancreatitis. -A.m. labs showing severe hypertriglyceridemia with triglyceride level of 4062, increased from 1710 on 04/27/2015. -She was given IV fluids, made nothing by mouth, and given narcotic analgesics for pain symptoms. -A.m. labs showing improved lipase of 68. Patient reporting feeling better. -Advancing diet to clears.  2.  Severe hypertriglyceridemia. -As mentioned above a.m. lab work revealing triglyceride level of 4062 which represents an increase from 1710 from 04/27/2015. -She had been treated with combination therapy atorvastatin and fenofibrate. -We talked about her diet today as she has had difficulties adhering to nonpharmacologic approaches, particularly with dietary indiscretions.   3.  Type 2 diabetes mellitus. -She initially presented with elevated blood sugars. -She was started on Levemir 10 units subcutaneous daily at bedtime. Continue sliding scale coverage. -Blood sugars improved.  Code Status: Full code Family Communication: Family not presest Disposition Plan: Advancing diet to clears    HPI/Subjective: Patient states feeling better today, asking to go home  Objective: Filed Vitals:   07/06/15 0548 07/06/15 1334  BP: 145/83 136/73  Pulse: 103 102  Temp: 98.3 F (36.8 C)   Resp: 20 19    Intake/Output Summary (Last 24 hours) at 07/06/15 1737 Last data filed at 07/06/15 1300  Gross per 24 hour  Intake      0 ml  Output      0 ml  Net      0 ml   Filed Weights   07/04/15 2242 07/05/15 0500 07/06/15 0500  Weight: 102 kg (224 lb 13.9 oz) 102  kg (224 lb 13.9 oz) 98.975 kg (218 lb 3.2 oz)    Exam:   General:  She is awake and alert oriented no acute distress  Cardiovascular: Regular rate and rhythm normal S1-S2 no murmurs or gallops  Respiratory: Normal respiratory effort  Abdomen: Soft nontender nondistended  Musculoskeletal: No edema  Data Reviewed: Basic Metabolic Panel:  Recent Labs Lab 07/04/15 1455 07/05/15 0137 07/06/15 0550  NA 137 135 133*  K 4.3 3.9 3.4*  CL 100* 104 102  CO2 23 20* 22  GLUCOSE 340* 323* 199*  BUN 12 7 9   CREATININE 0.78 0.57 0.56  CALCIUM 9.0 8.1* 8.3*  MG  --   --  1.8   Liver Function Tests:  Recent Labs Lab 07/04/15 1953 07/05/15 0137  AST 27 18  ALT 24 17  ALKPHOS 100 88  BILITOT 1.3* 1.0  PROT 6.7 6.0*  ALBUMIN 3.4* 2.8*    Recent Labs Lab 07/04/15 1455 07/06/15 0550  LIPASE 573* 68*  AMYLASE 247*  --    No results for input(s): AMMONIA in the last 168 hours. CBC:  Recent Labs Lab 07/04/15 1425 07/05/15 0137 07/06/15 0550  WBC 12.6* 13.6* 13.0*  NEUTROABS 10.7* 12.1*  --   HGB 15.1* 15.0 13.0  HCT 42.5 43.1 38.9  MCV 82.7 84.8 86.6  PLT 366 337 304   Cardiac Enzymes:  Recent Labs Lab 07/05/15 0137  TROPONINI <0.03   BNP (last 3 results) No results for input(s): BNP in the last 8760 hours.  ProBNP (last 3 results) No results for input(s): PROBNP in the last 8760 hours.  CBG:  Recent Labs Lab 07/05/15 2058 07/06/15 0030 07/06/15 0448 07/06/15 0809 07/06/15 1217  GLUCAP 224* 210* 206* 180* 153*    No results found for this or any previous visit (from the past 240 hour(s)).   Studies: US Abdomen Complete  07/05/2015  CLINICAL DATA:  Pancreatitis in generalized abdominal pain with vomiting 2 days. EXAM: ABDOMEN ULTRASOUND COMPLETE COMPARISON:  02/05/2014 FINDINGS: Gallbladder: No gallstones or wall thickening visualized. No sonographic Murphy sign noted by sonographer. Common bile duct: Diameter: 4.7 mm. Liver: Diffuse coarse  increased echogenicity compatible with steatosis. No focal mass. IVC: No abnormality visualized. Pancreas: Visualized portion unremarkable. Spleen: Size and appearance within normal limits. Right Kidney: Length: 14.3 cm. Echogenicity within normal limits. No mass or hydronephrosis visualized. Left Kidney: Length: 12.8 cm. Echogenicity within normal limits. No mass or hydronephrosis visualized. Abdominal aorta: No aneurysm visualized. Distal segment not well visualized due to overlying bowel gas. Other findings: Exam somewhat difficult due to body habitus. IMPRESSION: No acute hepatobiliary disease. Diffuse hepatic steatosis without focal mass. Electronically Signed   By: Marin Olp M.D.   On: 07/05/2015 08:28    Scheduled Meds: . atorvastatin  40 mg Oral Daily  . fenofibrate  160 mg Oral Daily  . fluticasone  2 spray Each Nare Daily  . insulin aspart  0-9 Units Subcutaneous 6 times per day  . insulin detemir  10 Units Subcutaneous QHS   Continuous Infusions: . sodium chloride      Principal Problem:   Pancreatitis, acute Active Problems:   Acute pancreatitis   Type II diabetes mellitus, uncontrolled ([place])   Hypertriglyceridemia    Time spent: 30 minutes    Kelvin Cellar  [facility] Pager [phone]. If 7PM-7AM, please contact night-coverage at [email or web address], password [facility] 07/06/2015, 5:37 PM  LOS: 1 day

## 2015-07-06 NOTE — Progress Notes (Signed)
Nutrition Education Note  RD consulted for nutrition education regarding a Heart Healthy diet.   Lipid Panel     Component Value Date/Time   CHOL 394* 07/05/2015 0137   TRIG 4062* 07/05/2015 0137   HDL NOT REPORTED DUE TO HIGH TRIGLYCERIDES 07/05/2015 0137   CHOLHDL NOT REPORTED DUE TO HIGH TRIGLYCERIDES 07/05/2015 0137   VLDL UNABLE TO CALCULATE IF TRIGLYCERIDE OVER 400 mg/dL 07/05/2015 0137   LDLCALC UNABLE TO CALCULATE IF TRIGLYCERIDE OVER 400 mg/dL 07/05/2015 [id number]   Spoke with pt at bedside. She reports that she was hospitalized with pancreatitis around one year ago with similar symptoms. She reports that for the past 3 months she has tried to eat healthier and exercise up until a few weeks ago. She expresses frustration with the inability to lose weight. She reports that she was using the elliptical for 30 minutes 3-5 times per week.   She reveals that she was seeing her PCP monthly, but transitioned to every 3 months. She reports her triglycerides have always been high and she was recently prescribed Zocor.   Pt reports she was consuming 5 small meals per day- Breakfast: sausage and egg biscuit, Lunch and Dinner: lean cuisine, Snacks included crackers and fruit. She reports she and her husband eat out once per week, but tries to order more healthful items such as steak and chicken. Education was tailored on ways she continue to reduce the amount of fat, sodium, and refined carbohydrates in her diet to lose weight to manage pancreatitis and minimize flare-ups.  Reviewed patient's dietary recall. Provided examples on ways to decrease sodium and fat intake in diet. Discouraged intake of processed foods and use of salt shaker. Encouraged fresh fruits and vegetables as well as whole grain sources of carbohydrates to maximize fiber intake. Teach back method used.  Expect fair compliance.  Body mass index is 36.31 kg/(m^2). Pt meets criteria for obesity, class II based on current  BMI.  Current diet order is clear liquid, patient is consuming approximately 50-75%% of meals at this time. Labs and medications reviewed. No further nutrition interventions warranted at this time. RD contact information provided. If additional nutrition issues arise, please re-consult RD.  Antion Andres A. Jimmye Norman, RD, LDN, CDE Pager: [phone] After hours Pager: [phone]

## 2015-07-07 DIAGNOSIS — E118 Type 2 diabetes mellitus with unspecified complications: Secondary | ICD-10-CM

## 2015-07-07 LAB — BASIC METABOLIC PANEL
Anion gap: 8 (ref 5–15)
BUN: 9 mg/dL (ref 6–20)
CHLORIDE: 101 mmol/L (ref 101–111)
CO2: 26 mmol/L (ref 22–32)
Calcium: 8.2 mg/dL — ABNORMAL LOW (ref 8.9–10.3)
Creatinine, Ser: 0.52 mg/dL (ref 0.44–1.00)
GFR calc non Af Amer: >60 mL/min (ref >60)
Glucose, Bld: 136 mg/dL — ABNORMAL HIGH (ref 65–99)
POTASSIUM: 3.5 mmol/L (ref 3.5–5.1)
SODIUM: 135 mmol/L (ref 135–145)

## 2015-07-07 LAB — CBC
HEMATOCRIT: 37.4 % (ref 36.0–46.0)
HEMOGLOBIN: 12.2 g/dL (ref 12.0–15.0)
MCH: 28.8 pg (ref 26.0–34.0)
MCHC: 32.6 g/dL (ref 30.0–36.0)
MCV: 88.2 fL (ref 78.0–100.0)
Platelets: 281 10*3/uL (ref 150–400)
RBC: 4.24 MIL/uL (ref 3.87–5.11)
RDW: 15.7 % — AB (ref 11.5–15.5)
WBC: 10.2 10*3/uL (ref 4.0–10.5)

## 2015-07-07 LAB — LIPASE, BLOOD: LIPASE: 52 U/L — AB (ref 11–51)

## 2015-07-07 LAB — GLUCOSE, CAPILLARY
GLUCOSE-CAPILLARY: 132 mg/dL — AB (ref 65–99)
GLUCOSE-CAPILLARY: 147 mg/dL — AB (ref 65–99)
GLUCOSE-CAPILLARY: 149 mg/dL — AB (ref 65–99)
Glucose-Capillary: 140 mg/dL — ABNORMAL HIGH (ref 65–99)
Glucose-Capillary: 156 mg/dL — ABNORMAL HIGH (ref 65–99)

## 2015-07-07 MED ORDER — PROMETHAZINE HCL 25 MG PO TABS: 25.0000 mg | ORAL_TABLET | Freq: Four times a day (QID) | ORAL | Status: DC | PRN

## 2015-07-07 MED ORDER — ATORVASTATIN CALCIUM 80 MG PO TABS: 80.0000 mg | ORAL_TABLET | Freq: Every day | ORAL | Status: DC

## 2015-07-07 MED ORDER — OXYCODONE-ACETAMINOPHEN 5-325 MG PO TABS: 1.0000 | ORAL_TABLET | Freq: Four times a day (QID) | ORAL | Status: DC | PRN

## 2015-07-07 NOTE — Progress Notes (Signed)
D/c to home w/ family ambulating per request

## 2015-07-07 NOTE — Care Management Note (Signed)
Case Management Note  Patient Details  Name: Teresa Parks MRN: [id number] Date of Birth: 23-Aug-1970  Subjective/Objective:   Patient is for dc today, she will be on phenergan 25 mg which is on $4 list at [facility] and oxycodone.  Patient does not want to change her pcp who is  [name] Press photographer) at [facility] center.                   Action/Plan:   Expected Discharge Date:                  Expected Discharge Plan:  Home/Self Care  In-House Referral:     Discharge planning Services  CM Consult  Post Acute Care Choice:    Choice offered to:     DME Arranged:    DME Agency:     HH Arranged:    [place] Agency:     Status of Service:  Completed, signed off  Medicare Important Message Given:    Date Medicare IM Given:    Medicare IM give by:    Date Additional Medicare IM Given:    Additional Medicare Important Message give by:     If discussed at Advance of Stay Meetings, dates discussed:    Additional Comments:  Zenon Mayo, RN 07/07/2015, 11:09 AM

## 2015-07-07 NOTE — Discharge Summary (Signed)
Physician Discharge Summary  Teresa Parks [id number] DOB: 05-02-1971 DOA: 07/04/2015  PCP: [name], PA-C  Admit date: 07/04/2015 Discharge date: 07/07/2015  Time spent: 35 minutes  Recommendations for Outpatient Follow-up:  1. Please follow up on triglyceride levels, fasting lipid panel during this hospitalization revealed severe hypertriglyceridemia with triglycerides of 4062. She was discharged on, initial therapy of Lipitor and fenofibrate 2. She was admitted for acute pancreatitis likely secondary to severe hypertriglyceridemia, his follow-up on abdominal symptoms.   Discharge Diagnoses:  Principal Problem:   Pancreatitis, acute Active Problems:   Acute pancreatitis   Type II diabetes mellitus, uncontrolled ([place])   Hypertriglyceridemia   Discharge Condition: Stable  Diet recommendation: Carb Modified heart healthy diet   Filed Weights   07/05/15 0500 07/06/15 0500 07/07/15 0405  Weight: 102 kg (224 lb 13.9 oz) 98.975 kg (218 lb 3.2 oz) 98.127 kg (216 lb 5.3 oz)    History of present illness:  Teresa Parks is a 44 y.o. female with history of recurrent pancreatitis from hypertriglyceridemia presents to the ER because of abdominal pain. Patient's abdominal pain started last morning. Patient states she went to bed after supper and started having abdominal pain in the morning. Patient had multiple episodes of nausea vomiting. Denies any diarrhea. There is mostly epigastric area with non-radiation. In the ER patient's lipase was found to be elevated and has been admitted for pancreatitis. Patient denies any chest pain or shortness of breath fever chills. Denies drinking alcohol. States she has been compliant with her medications.  Hospital Course:   Acute Pancreatitis -Teresa Parks is a 44 year old female with a history of hypertriglyceridemia who presented to the emergent department on 07/05/2015 with complaints of abdominal pain. Lab work revealed an elevated  lipase level of 573. She was admitted for acute pancreatitis. -A.m. labs showing severe hypertriglyceridemia with triglyceride level of 4062, increased from 1710 on 04/27/2015. -She was given IV fluids, made nothing by mouth, and given narcotic analgesics for pain symptoms. -Repeat labs showing downward trend in lipase levels. Her diet was advanced which she tolerated. -By 07/07/2015 she was tolerating by mouth intake and discharged home in stable condition.  2. Severe hypertriglyceridemia. -As mentioned above a.m. lab work revealing triglyceride level of 4062 which represents an increase from 1710 from 04/27/2015. -She had been treated with combination therapy atorvastatin and fenofibrate. -We talked about her diet as she has had difficulties adhering to nonpharmacologic approaches, particularly with dietary indiscretions.  -Her Lipitor was increased from 40-80 mg by mouth daily. Will continue fenofibrate   3. Type 2 diabetes mellitus. -She is discharged on Levemir 12 units subcutaneous daily  -Sugar stable    Discharge Exam: Filed Vitals:   07/07/15 0430 07/07/15 1411  BP: 111/65 117/69  Pulse: 83 89  Temp: 98.2 F (36.8 C) 98.2 F (36.8 C)  Resp: 16 19     General: She is awake and alert oriented no acute distress  Cardiovascular: Regular rate and rhythm normal S1-S2 no murmurs or gallops  Respiratory: Normal respiratory effort  Abdomen: Soft nontender nondistended  Musculoskeletal: No edema  Discharge Instructions   Discharge Instructions    Call MD for:  difficulty breathing, headache or visual disturbances    Complete by:  As directed      Call MD for:  extreme fatigue    Complete by:  As directed      Call MD for:  hives    Complete by:  As directed  Call MD for:  persistant dizziness or light-headedness    Complete by:  As directed      Call MD for:  persistant nausea and vomiting    Complete by:  As directed      Call MD for:  redness, tenderness,  or signs of infection (pain, swelling, redness, odor or green/yellow discharge around incision site)    Complete by:  As directed      Call MD for:  severe uncontrolled pain    Complete by:  As directed      Call MD for:  temperature >100.4    Complete by:  As directed      Call MD for:    Complete by:  As directed      Diet - low sodium heart healthy    Complete by:  As directed      Increase activity slowly    Complete by:  As directed           Current Discharge Medication List    START taking these medications   Details  oxyCODONE-acetaminophen (PERCOCET/ROXICET) 5-325 MG tablet Take 1-2 tablets by mouth every 6 (six) hours as needed for severe pain. Qty: 20 tablet, Refills: 0      CONTINUE these medications which have CHANGED   Details  atorvastatin (LIPITOR) 80 MG tablet Take 1 tablet (80 mg total) by mouth daily. Qty: 30 tablet, Refills: 1    promethazine (PHENERGAN) 25 MG tablet Take 1 tablet (25 mg total) by mouth every 6 (six) hours as needed for nausea or vomiting. Qty: 15 tablet, Refills: 0      CONTINUE these medications which have NOT CHANGED   Details  fenofibrate 160 MG tablet TAKE ONE TABLET BY MOUTH ONCE DAILY Qty: 30 tablet, Refills: 0    fluticasone (FLONASE) 50 MCG/ACT nasal spray Place 2 sprays into both nostrils daily. Qty: 16 g, Refills: 6   Associated Diagnoses: Barotitis media, initial encounter    glucose blood test strip Use as instructed Qty: 100 each, Refills: 12    Insulin Detemir (LEVEMIR) 100 UNIT/ML Pen Inject 12 Units into the skin daily at 10 pm. Qty: 15 mL, Refills: 2   Associated Diagnoses: Type II diabetes mellitus, uncontrolled ([facility])    Insulin Pen Needle (1ST CHOICE PEN NEEDLES) 31G X 6 MM MISC For insulin injections as directed Qty: 100 each, Refills: 0    Lancets (FREESTYLE) lancets Use as instructed Qty: 100 each, Refills: 12    lisinopril (PRINIVIL,ZESTRIL) 10 MG tablet Take 1 tablet (10 mg total) by mouth daily. Qty:  30 tablet, Refills: 2   Associated Diagnoses: Diabetes mellitus type II, uncontrolled ([place])    metFORMIN (GLUCOPHAGE) 500 MG tablet TAKE ONE TABLET BY MOUTH TWICE DAILY WITH MEALS Qty: 60 tablet, Refills: 5      STOP taking these medications     simvastatin (ZOCOR) 40 MG tablet        Allergies  Allergen Reactions  . Novocain [Procaine] Nausea And Vomiting   Follow-up Information    Follow up with [name], PA-C.   Specialty:  Family Medicine   Why:  in 2-3 days   Contact information:   [address] [place] [address] [phone]       Follow up with [name], PA-C.   Specialty:  Family Medicine   Contact information:   [address] [place] [address] [phone]  The results of significant diagnostics from this hospitalization (including imaging, microbiology, ancillary and laboratory) are listed below for reference.    Significant Diagnostic Studies: US Abdomen Complete  07/05/2015  CLINICAL DATA:  Pancreatitis in generalized abdominal pain with vomiting 2 days. EXAM: ABDOMEN ULTRASOUND COMPLETE COMPARISON:  02/05/2014 FINDINGS: Gallbladder: No gallstones or wall thickening visualized. No sonographic Murphy sign noted by sonographer. Common bile duct: Diameter: 4.7 mm. Liver: Diffuse coarse increased echogenicity compatible with steatosis. No focal mass. IVC: No abnormality visualized. Pancreas: Visualized portion unremarkable. Spleen: Size and appearance within normal limits. Right Kidney: Length: 14.3 cm. Echogenicity within normal limits. No mass or hydronephrosis visualized. Left Kidney: Length: 12.8 cm. Echogenicity within normal limits. No mass or hydronephrosis visualized. Abdominal aorta: No aneurysm visualized. Distal segment not well visualized due to overlying bowel gas. Other findings: Exam somewhat difficult due to body habitus. IMPRESSION: No acute hepatobiliary disease. Diffuse hepatic  steatosis without focal mass. Electronically Signed   By: Marin Olp M.D.   On: 07/05/2015 08:28   Dg Abd 2 Views  07/04/2015  CLINICAL DATA:  Upper abdominal pain and vomiting. History of pancreatitis. EXAM: ABDOMEN - 2 VIEW COMPARISON:  None. FINDINGS: No disproportionately dilated small bowel loops or air-fluid levels. Mild-to-moderate stool throughout the colon and rectum. No evidence of pneumatosis or pneumoperitoneum. No pathologic soft tissue calcifications. Visualized lung bases appear clear. IMPRESSION: Nonobstructive bowel gas pattern. Mild to moderate colorectal stool volume. Electronically Signed   By: Ilona Sorrel M.D.   On: 07/04/2015 16:56    Microbiology: No results found for this or any previous visit (from the past 240 hour(s)).   Labs: Basic Metabolic Panel:  Recent Labs Lab 07/04/15 1455 07/05/15 0137 07/06/15 0550 07/07/15 0518  NA 137 135 133* 135  K 4.3 3.9 3.4* 3.5  CL 100* 104 102 101  CO2 23 20* 22 26  GLUCOSE 340* 323* 199* 136*  BUN 12 7 9 9   CREATININE 0.78 0.57 0.56 0.52  CALCIUM 9.0 8.1* 8.3* 8.2*  MG  --   --  1.8  --    Liver Function Tests:  Recent Labs Lab 07/04/15 1953 07/05/15 0137  AST 27 18  ALT 24 17  ALKPHOS 100 88  BILITOT 1.3* 1.0  PROT 6.7 6.0*  ALBUMIN 3.4* 2.8*    Recent Labs Lab 07/04/15 1455 07/06/15 0550 07/07/15 0518  LIPASE 573* 68* 52*  AMYLASE 247*  --   --    No results for input(s): AMMONIA in the last 168 hours. CBC:  Recent Labs Lab 07/04/15 1425 07/05/15 0137 07/06/15 0550 07/07/15 0518  WBC 12.6* 13.6* 13.0* 10.2  NEUTROABS 10.7* 12.1*  --   --   HGB 15.1* 15.0 13.0 12.2  HCT 42.5 43.1 38.9 37.4  MCV 82.7 84.8 86.6 88.2  PLT 366 337 304 281   Cardiac Enzymes:  Recent Labs Lab 07/05/15 0137  TROPONINI <0.03   BNP: BNP (last 3 results) No results for input(s): BNP in the last 8760 hours.  ProBNP (last 3 results) No results for input(s): PROBNP in the last 8760  hours.  CBG:  Recent Labs Lab 07/06/15 1742 07/06/15 2010 07/06/15 2338 07/07/15 0404 07/07/15 0748  GLUCAP 156* 197* 140* 156* 132*       Signed:  Kelvin Cellar MD  FACP  [facility] 07/07/2015, 2:36 PM

## 2015-07-07 NOTE — Progress Notes (Signed)
Patient asked for dilaudid medication but stated that her pain was only like a 4-5. Patient educated that dilaudid is in [facility] for severe pain. Patient states that she understands and will ask if pain increased or if she needs something else.

## 2015-07-11 ENCOUNTER — Other Ambulatory Visit: Payer: Self-pay | Admitting: Physician Assistant

## 2015-07-20 ENCOUNTER — Ambulatory Visit: Payer: Self-pay | Admitting: Physician Assistant

## 2015-07-22 ENCOUNTER — Ambulatory Visit (INDEPENDENT_AMBULATORY_CARE_PROVIDER_SITE_OTHER): Payer: Self-pay | Admitting: Physician Assistant

## 2015-07-22 ENCOUNTER — Encounter: Payer: Self-pay | Admitting: Physician Assistant

## 2015-07-22 VITALS — BP 133/81 | HR 108 | Temp 97.9°F | Ht 64.25 in | Wt 210.2 lb

## 2015-07-22 DIAGNOSIS — K859 Acute pancreatitis without necrosis or infection, unspecified: Secondary | ICD-10-CM

## 2015-07-22 DIAGNOSIS — E781 Pure hyperglyceridemia: Secondary | ICD-10-CM

## 2015-07-22 LAB — LIPASE: Lipase: 70 U/L — ABNORMAL HIGH (ref 11.0–59.0)

## 2015-07-22 LAB — LDL CHOLESTEROL, DIRECT: LDL DIRECT: 31 mg/dL

## 2015-07-22 LAB — CBC
HEMATOCRIT: 44 % (ref 36.0–46.0)
Hemoglobin: 14.6 g/dL (ref 12.0–15.0)
MCHC: 33.1 g/dL (ref 30.0–36.0)
MCV: 84.7 fl (ref 78.0–100.0)
Platelets: 411 10*3/uL — ABNORMAL HIGH (ref 150.0–400.0)
RBC: 5.2 Mil/uL — ABNORMAL HIGH (ref 3.87–5.11)
RDW: 16.4 % — AB (ref 11.5–15.5)
WBC: 9.9 10*3/uL (ref 4.0–10.5)

## 2015-07-22 LAB — LIPID PANEL
CHOL/HDL RATIO: 4
Cholesterol: 111 mg/dL (ref 0–200)
HDL: 26.4 mg/dL — ABNORMAL LOW (ref >39.00)
Triglycerides: 524 mg/dL — ABNORMAL HIGH (ref 0.0–149.0)

## 2015-07-22 NOTE — Progress Notes (Signed)
Pre visit review using our clinic review tool, if applicable. No additional management support is needed unless otherwise documented below in the visit note. 

## 2015-07-22 NOTE — Assessment & Plan Note (Signed)
Clinically resolved. Concern for a genetic component giving significant elevation despite medication and recurrent infections. Discussed medication options -- would like to switch fenofibrate to gemfibrozil but would have to consider statin dose. Spoke with PharmD who felt if switch was made statin would need tobe discontinued which would not help TGL. Discussed Lovaza and Vascepa but patient without insurance. Recommended addition of OTC fish oils until patient can be seen by lipid clinic. Urgent referral to lipid clinic placed. Will check CBC, lipase and lipid panel today.

## 2015-07-22 NOTE — Patient Instructions (Signed)
Please go to the lab for blood work. I will call you with your results. Go downstairs to the pharmacy to pick up some OTC fish oil supplements to add to your regimen. Take following package directions.  Keep your phone on as you will be contacted for an urgent appointment with our lipid clinic.   If any symptoms recur, start a liquid diet and call me or go to the ER.

## 2015-07-22 NOTE — Progress Notes (Signed)
Patient presents to clinic today for hospital follow-up of pancreatitis. Patient admitted to the hospital on 07/04/15 and discharged on 07/07/15. Patient initially presented to ER with significant abdominal pain with nausea and forceful vomiting. Lipase elevated at 573. Patient was stabilized and discharged on the same medications as at admission.  This has been patient's 3rd or 4th bout with pancreatitis. Is on max dose of Lipitor and fenofibrate. TGL at time of admission in the 4000s. Denies abdominal pain, nausea or vomiting. Has been staying well hydrated. Is taking medications as directed. Is eating some soft foods but has not resumed regular diet.   Past Medical History  Diagnosis Date  . Pancreatitis, acute   . Diabetes mellitus type II, controlled ([place])   . Hyperlipidemia   . Chronic ear infection     Current Outpatient Prescriptions on File Prior to Visit  Medication Sig Dispense Refill  . atorvastatin (LIPITOR) 80 MG tablet Take 1 tablet (80 mg total) by mouth daily. 30 tablet 1  . fluticasone (FLONASE) 50 MCG/ACT nasal spray Place 2 sprays into both nostrils daily. 16 g 6  . glucose blood test strip Use as instructed 100 each 12  . Insulin Pen Needle (1ST CHOICE PEN NEEDLES) 31G X 6 MM MISC For insulin injections as directed 100 each 0  . Lancets (FREESTYLE) lancets Use as instructed 100 each 12  . LEVEMIR FLEXTOUCH 100 UNIT/ML Pen INJECT 12 UNITS UNDER THE SKIN DAILY AT 10PM 15 mL 3  . lisinopril (PRINIVIL,ZESTRIL) 10 MG tablet Take 1 tablet (10 mg total) by mouth daily. 30 tablet 2  . metFORMIN (GLUCOPHAGE) 500 MG tablet TAKE ONE TABLET BY MOUTH TWICE DAILY WITH MEALS 60 tablet 5  . oxyCODONE-acetaminophen (PERCOCET/ROXICET) 5-325 MG tablet Take 1-2 tablets by mouth every 6 (six) hours as needed for severe pain. 12 tablet 0  . promethazine (PHENERGAN) 25 MG tablet Take 1 tablet (25 mg total) by mouth every 6 (six) hours as needed for nausea or vomiting. 15 tablet 0   No  current facility-administered medications on file prior to visit.    Allergies  Allergen Reactions  . Novocain [Procaine] Nausea And Vomiting    Family History  Problem Relation Age of Onset  . Heart attack Father 27    Deceased  . Diabetes Father   . Diabetes Paternal Uncle   . Diabetes Mother   . Hypertension Mother     Living  . Alzheimer's disease Maternal Grandfather   . Healthy Brother   . Healthy Daughter     Social History   Social History  . Marital Status: Married    Spouse Name: N/A  . Number of Children: N/A  . Years of Education: N/A   Social History Main Topics  . Smoking status: Former Smoker    Quit date: 05/22/2015  . Smokeless tobacco: None     Comment: Pt has not smoked in 1.5 Wks  . Alcohol Use: No  . Drug Use: No  . Sexual Activity: Not Asked   Other Topics Concern  . None   Social History Narrative   Review of Systems - See HPI.  All other ROS are negative.  BP 133/81 mmHg  Pulse 108  Temp(Src) 97.9 F (36.6 C) (Oral)  Ht 5' 4.25" (1.632 m)  Wt 210 lb 3.2 oz (95.346 kg)  BMI 35.80 kg/m2  SpO2 98%  LMP 07/04/2015  Physical Exam  Constitutional: She is oriented to person, place, and time and well-developed, well-nourished, and  in no distress.  HENT:  Head: Normocephalic and atraumatic.  Eyes: Conjunctivae are normal.  Neck: Neck supple.  Cardiovascular: Normal rate, regular rhythm, normal heart sounds and intact distal pulses.   Pulmonary/Chest: Effort normal and breath sounds normal.  Abdominal: Soft. Bowel sounds are normal. She exhibits no distension and no mass. There is no tenderness. There is no rebound and no guarding.  Neurological: She is alert and oriented to person, place, and time.  Skin: Skin is warm and dry. No rash noted.  Psychiatric: Affect normal.  Vitals reviewed.   Recent Results (from the past 2160 hour(s))  Comp Met (CMET)     Status: Abnormal   Collection Time: 04/27/15  7:38 AM  Result Value Ref  Range   Sodium 130 (L) 135 - 145 mEq/L   Potassium 4.1 3.5 - 5.1 mEq/L   Chloride 94 (L) 96 - 112 mEq/L   CO2 27 19 - 32 mEq/L   Glucose, Bld 266 (H) 70 - 99 mg/dL   BUN 9 6 - 23 mg/dL   Creatinine, Ser 0.68 0.40 - 1.20 mg/dL   Total Bilirubin 0.4 0.2 - 1.2 mg/dL   Alkaline Phosphatase 95 39 - 117 U/L   AST 10 0 - 37 U/L   ALT 13 0 - 35 U/L   Total Protein 7.2 6.0 - 8.3 g/dL   Albumin 3.7 3.5 - 5.2 g/dL   Calcium 9.0 8.4 - 10.5 mg/dL   GFR 99.18 >60.00 mL/min  Lipase     Status: Abnormal   Collection Time: 04/27/15  7:38 AM  Result Value Ref Range   Lipase 72.0 (H) 11.0 - 59.0 U/L  Hemoglobin A1c     Status: Abnormal   Collection Time: 04/27/15  7:38 AM  Result Value Ref Range   Hgb A1c MFr Bld 9.7 (H) 4.6 - 6.5 %    Comment: Glycemic Control Guidelines for People with Diabetes:Non Diabetic:  <6%Goal of Therapy: <7%Additional Action Suggested:  >8%   Lipid Profile     Status: Abnormal   Collection Time: 04/27/15  7:38 AM  Result Value Ref Range   Cholesterol 226 (H) 0 - 200 mg/dL    Comment: ATP III Classification       Desirable:  < 200 mg/dL               Borderline High:  200 - 239 mg/dL          High:  > = 240 mg/dL   Triglycerides (H) 0.0 - 149.0 mg/dL    1710.0 Triglyceride is over 400; calculations on Lipids are invalid.    Comment: Normal:  <150 mg/dLBorderline High:  150 - 199 mg/dL   HDL 22.60 (L) >39.00 mg/dL   Total CHOL/HDL Ratio 10     Comment:                Men          Women1/2 Average Risk     3.4          3.3Average Risk          5.0          4.42X Average Risk          9.6          7.13X Average Risk          15.0          11.0  LDL cholesterol, direct     Status: None   Collection Time: 04/27/15  7:38 AM  Result Value Ref Range   Direct LDL 27.0 mg/dL    Comment: Optimal:  <100 mg/dLNear or Above Optimal:  100-129 mg/dLBorderline High:  130-159 mg/dLHigh:  160-189 mg/dLVery High:  >190 mg/dL  Urinalysis, Routine w reflex microscopic  (not at [facility])     Status: Abnormal   Collection Time: 07/04/15  2:00 PM  Result Value Ref Range   Color, Urine AMBER (A) YELLOW    Comment: BIOCHEMICALS MAY BE AFFECTED BY COLOR   APPearance TURBID (A) CLEAR   Specific Gravity, Urine >1.046 (H) 1.005 - 1.030   pH 5.5 5.0 - 8.0   Glucose, UA >1000 (A) NEGATIVE mg/dL   Hgb urine dipstick SMALL (A) NEGATIVE   Bilirubin Urine SMALL (A) NEGATIVE   Ketones, ur 15 (A) NEGATIVE mg/dL   Protein, ur >300 (A) NEGATIVE mg/dL   Nitrite NEGATIVE NEGATIVE   Leukocytes, UA NEGATIVE NEGATIVE  Urine microscopic-add on     Status: Abnormal   Collection Time: 07/04/15  2:00 PM  Result Value Ref Range   Squamous Epithelial / LPF 0-5 (A) NONE SEEN   WBC, UA 0-5 0 - 5 WBC/hpf   RBC / HPF 0-5 0 - 5 RBC/hpf   Bacteria, UA FEW (A) NONE SEEN   Crystals CA OXALATE CRYSTALS (A) NEGATIVE   Urine-Other AMORPHOUS URATES/PHOSPHATES   POC CBG, ED     Status: Abnormal   Collection Time: 07/04/15  2:12 PM  Result Value Ref Range   Glucose-Capillary 329 (H) 65 - 99 mg/dL   Comment 1 Notify RN    Comment 2 Document in Chart   CBC WITH DIFFERENTIAL     Status: Abnormal   Collection Time: 07/04/15  2:25 PM  Result Value Ref Range   WBC 12.6 (H) 4.0 - 10.5 K/uL   RBC 5.14 (H) 3.87 - 5.11 MIL/uL   Hemoglobin 15.1 (H) 12.0 - 15.0 g/dL   HCT 42.5 36.0 - 46.0 %   MCV 82.7 78.0 - 100.0 fL   MCH 29.4 26.0 - 34.0 pg   MCHC 35.5 30.0 - 36.0 g/dL    Comment: CORRECTED FOR INTERFERING SUBSTANCE   RDW 15.1 11.5 - 15.5 %   Platelets 366 150 - 400 K/uL   Neutrophils Relative % 83 %   Lymphocytes Relative 13 %   Monocytes Relative 1 %   Eosinophils Relative 1 %   Basophils Relative 1 %   Band Neutrophils 1 %   Neutro Abs 10.7 (H) 1.7 - 7.7 K/uL   Lymphs Abs 1.6 0.7 - 4.0 K/uL   Monocytes Absolute 0.1 0.1 - 1.0 K/uL   Eosinophils Absolute 0.1 0.0 - 0.7 K/uL   Basophils Absolute 0.1 0.0 - 0.1 K/uL   WBC Morphology ATYPICAL LYMPHOCYTES    Smear Review LARGE PLATELETS  PRESENT     Comment: GIANT PLATELETS SEEN  Basic metabolic panel     Status: Abnormal   Collection Time: 07/04/15  2:55 PM  Result Value Ref Range   Sodium 137 135 - 145 mmol/L   Potassium 4.3 3.5 - 5.1 mmol/L   Chloride 100 (L) 101 - 111 mmol/L   CO2 23 22 - 32 mmol/L   Glucose, Bld 340 (H) 65 - 99 mg/dL   BUN 12 6 - 20 mg/dL   Creatinine, Ser 0.78 0.44 - 1.00 mg/dL   Calcium 9.0 8.9 - 10.3 mg/dL   GFR calc  non Af Amer >60 >60 mL/min   GFR calc Af Amer >60 >60 mL/min    Comment: (NOTE) The eGFR has been calculated using the CKD EPI equation. This calculation has not been validated in all clinical situations. eGFR's persistently <60 mL/min signify possible Chronic Kidney Disease.    Anion gap 14 5 - 15    Comment: Performed at [facility]  Amylase     Status: Abnormal   Collection Time: 07/04/15  2:55 PM  Result Value Ref Range   Amylase 247 (H) 28 - 100 U/L    Comment: Performed at [facility]  Lipase, blood     Status: Abnormal   Collection Time: 07/04/15  2:55 PM  Result Value Ref Range   Lipase 573 (H) 11 - 51 U/L    Comment: RESULTS CONFIRMED BY MANUAL DILUTION Performed at [facility]   POC CBG, ED     Status: Abnormal   Collection Time: 07/04/15  5:01 PM  Result Value Ref Range   Glucose-Capillary 281 (H) 65 - 99 mg/dL  Hepatic function panel     Status: Abnormal   Collection Time: 07/04/15  7:53 PM  Result Value Ref Range   Total Protein 6.7 6.5 - 8.1 g/dL   Albumin 3.4 (L) 3.5 - 5.0 g/dL   AST 27 15 - 41 U/L   ALT 24 14 - 54 U/L   Alkaline Phosphatase 100 38 - 126 U/L   Total Bilirubin 1.3 (H) 0.3 - 1.2 mg/dL   Bilirubin, Direct 0.3 0.1 - 0.5 mg/dL   Indirect Bilirubin 1.0 (H) 0.3 - 0.9 mg/dL    Comment: Performed at [facility]  CBG monitoring, ED     Status: Abnormal   Collection Time: 07/04/15  7:57 PM  Result Value Ref Range   Glucose-Capillary 276 (H) 65 - 99 mg/dL  Glucose, capillary     Status: Abnormal    Collection Time: 07/05/15  1:16 AM  Result Value Ref Range   Glucose-Capillary 314 (H) 65 - 99 mg/dL  Lipid panel     Status: Abnormal   Collection Time: 07/05/15  1:37 AM  Result Value Ref Range   Cholesterol 394 (H) 0 - 200 mg/dL   Triglycerides 4062 (H) <150 mg/dL   HDL NOT REPORTED DUE TO HIGH TRIGLYCERIDES >40 mg/dL   Total CHOL/HDL Ratio NOT REPORTED DUE TO HIGH TRIGLYCERIDES RATIO   VLDL UNABLE TO CALCULATE IF TRIGLYCERIDE OVER 400 mg/dL 0 - 40 mg/dL   LDL Cholesterol UNABLE TO CALCULATE IF TRIGLYCERIDE OVER 400 mg/dL 0 - 99 mg/dL  Troponin I     Status: None   Collection Time: 07/05/15  1:37 AM  Result Value Ref Range   Troponin I <0.03 <0.031 ng/mL    Comment:        NO INDICATION OF MYOCARDIAL INJURY.   Comprehensive metabolic panel     Status: Abnormal   Collection Time: 07/05/15  1:37 AM  Result Value Ref Range   Sodium 135 135 - 145 mmol/L   Potassium 3.9 3.5 - 5.1 mmol/L   Chloride 104 101 - 111 mmol/L   CO2 20 (L) 22 - 32 mmol/L   Glucose, Bld 323 (H) 65 - 99 mg/dL   BUN 7 6 - 20 mg/dL   Creatinine, Ser 0.57 0.44 - 1.00 mg/dL   Calcium 8.1 (L) 8.9 - 10.3 mg/dL   Total Protein 6.0 (L) 6.5 - 8.1 g/dL   Albumin 2.8 (L) 3.5 - 5.0  g/dL   AST 18 15 - 41 U/L   ALT 17 14 - 54 U/L   Alkaline Phosphatase 88 38 - 126 U/L   Total Bilirubin 1.0 0.3 - 1.2 mg/dL   GFR calc non Af Amer >60 >60 mL/min   GFR calc Af Amer >60 >60 mL/min    Comment: (NOTE) The eGFR has been calculated using the CKD EPI equation. This calculation has not been validated in all clinical situations. eGFR's persistently <60 mL/min signify possible Chronic Kidney Disease.    Anion gap 11 5 - 15  CBC WITH DIFFERENTIAL     Status: Abnormal   Collection Time: 07/05/15  1:37 AM  Result Value Ref Range   WBC 13.6 (H) 4.0 - 10.5 K/uL   RBC 5.08 3.87 - 5.11 MIL/uL   Hemoglobin 15.0 12.0 - 15.0 g/dL   HCT 43.1 36.0 - 46.0 %   MCV 84.8 78.0 - 100.0 fL   MCH 29.5 26.0 - 34.0 pg   MCHC 34.8 30.0 -  36.0 g/dL   RDW 15.0 11.5 - 15.5 %   Platelets 337 150 - 400 K/uL   Neutrophils Relative % 89 %   Neutro Abs 12.1 (H) 1.7 - 7.7 K/uL   Lymphocytes Relative 7 %   Lymphs Abs 0.9 0.7 - 4.0 K/uL   Monocytes Relative 4 %   Monocytes Absolute 0.6 0.1 - 1.0 K/uL   Eosinophils Relative 0 %   Eosinophils Absolute 0.0 0.0 - 0.7 K/uL   Basophils Relative 0 %   Basophils Absolute 0.0 0.0 - 0.1 K/uL  Glucose, capillary     Status: Abnormal   Collection Time: 07/05/15  5:07 AM  Result Value Ref Range   Glucose-Capillary 313 (H) 65 - 99 mg/dL  Pregnancy, urine     Status: None   Collection Time: 07/05/15  6:15 AM  Result Value Ref Range   Preg Test, Ur NEGATIVE NEGATIVE    Comment:        THE SENSITIVITY OF THIS METHODOLOGY IS >20 mIU/mL.   Glucose, capillary     Status: Abnormal   Collection Time: 07/05/15  7:58 AM  Result Value Ref Range   Glucose-Capillary 297 (H) 65 - 99 mg/dL  Glucose, capillary     Status: Abnormal   Collection Time: 07/05/15 11:42 AM  Result Value Ref Range   Glucose-Capillary 250 (H) 65 - 99 mg/dL  Glucose, capillary     Status: Abnormal   Collection Time: 07/05/15  4:50 PM  Result Value Ref Range   Glucose-Capillary 250 (H) 65 - 99 mg/dL  Glucose, capillary     Status: Abnormal   Collection Time: 07/05/15  8:58 PM  Result Value Ref Range   Glucose-Capillary 224 (H) 65 - 99 mg/dL  Glucose, capillary     Status: Abnormal   Collection Time: 07/06/15 12:30 AM  Result Value Ref Range   Glucose-Capillary 210 (H) 65 - 99 mg/dL  Glucose, capillary     Status: Abnormal   Collection Time: 07/06/15  4:48 AM  Result Value Ref Range   Glucose-Capillary 206 (H) 65 - 99 mg/dL   Comment 1 Notify RN   CBC     Status: Abnormal   Collection Time: 07/06/15  5:50 AM  Result Value Ref Range   WBC 13.0 (H) 4.0 - 10.5 K/uL   RBC 4.49 3.87 - 5.11 MIL/uL   Hemoglobin 13.0 12.0 - 15.0 g/dL   HCT 38.9 36.0 - 46.0 %   MCV 86.6  78.0 - 100.0 fL   MCH 29.0 26.0 - 34.0 pg   MCHC  33.4 30.0 - 36.0 g/dL   RDW 15.8 (H) 11.5 - 15.5 %   Platelets 304 150 - 400 K/uL  Basic metabolic panel     Status: Abnormal   Collection Time: 07/06/15  5:50 AM  Result Value Ref Range   Sodium 133 (L) 135 - 145 mmol/L   Potassium 3.4 (L) 3.5 - 5.1 mmol/L   Chloride 102 101 - 111 mmol/L   CO2 22 22 - 32 mmol/L   Glucose, Bld 199 (H) 65 - 99 mg/dL   BUN 9 6 - 20 mg/dL   Creatinine, Ser 0.56 0.44 - 1.00 mg/dL   Calcium 8.3 (L) 8.9 - 10.3 mg/dL   GFR calc non Af Amer >60 >60 mL/min   GFR calc Af Amer >60 >60 mL/min    Comment: (NOTE) The eGFR has been calculated using the CKD EPI equation. This calculation has not been validated in all clinical situations. eGFR's persistently <60 mL/min signify possible Chronic Kidney Disease.    Anion gap 9 5 - 15  Magnesium     Status: None   Collection Time: 07/06/15  5:50 AM  Result Value Ref Range   Magnesium 1.8 1.7 - 2.4 mg/dL  TSH     Status: None   Collection Time: 07/06/15  5:50 AM  Result Value Ref Range   TSH 1.377 0.350 - 4.500 uIU/mL  Lipase, blood     Status: Abnormal   Collection Time: 07/06/15  5:50 AM  Result Value Ref Range   Lipase 68 (H) 11 - 51 U/L  Glucose, capillary     Status: Abnormal   Collection Time: 07/06/15  8:09 AM  Result Value Ref Range   Glucose-Capillary 180 (H) 65 - 99 mg/dL  Glucose, capillary     Status: Abnormal   Collection Time: 07/06/15 12:17 PM  Result Value Ref Range   Glucose-Capillary 153 (H) 65 - 99 mg/dL  Glucose, capillary     Status: Abnormal   Collection Time: 07/06/15  5:42 PM  Result Value Ref Range   Glucose-Capillary 156 (H) 65 - 99 mg/dL  Glucose, capillary     Status: Abnormal   Collection Time: 07/06/15  8:10 PM  Result Value Ref Range   Glucose-Capillary 197 (H) 65 - 99 mg/dL   Comment 1 Notify RN    Comment 2 Document in Chart   Glucose, capillary     Status: Abnormal   Collection Time: 07/06/15 11:38 PM  Result Value Ref Range   Glucose-Capillary 140 (H) 65 - 99 mg/dL   Glucose, capillary     Status: Abnormal   Collection Time: 07/07/15  4:04 AM  Result Value Ref Range   Glucose-Capillary 156 (H) 65 - 99 mg/dL  Lipase, blood     Status: Abnormal   Collection Time: 07/07/15  5:18 AM  Result Value Ref Range   Lipase 52 (H) 11 - 51 U/L  Basic metabolic panel     Status: Abnormal   Collection Time: 07/07/15  5:18 AM  Result Value Ref Range   Sodium 135 135 - 145 mmol/L   Potassium 3.5 3.5 - 5.1 mmol/L   Chloride 101 101 - 111 mmol/L   CO2 26 22 - 32 mmol/L   Glucose, Bld 136 (H) 65 - 99 mg/dL   BUN 9 6 - 20 mg/dL   Creatinine, Ser 0.52 0.44 - 1.00 mg/dL   Calcium 8.2 (L)  8.9 - 10.3 mg/dL   GFR calc non Af Amer >60 >60 mL/min   GFR calc Af Amer >60 >60 mL/min    Comment: (NOTE) The eGFR has been calculated using the CKD EPI equation. This calculation has not been validated in all clinical situations. eGFR's persistently <60 mL/min signify possible Chronic Kidney Disease.    Anion gap 8 5 - 15  CBC     Status: Abnormal   Collection Time: 07/07/15  5:18 AM  Result Value Ref Range   WBC 10.2 4.0 - 10.5 K/uL   RBC 4.24 3.87 - 5.11 MIL/uL   Hemoglobin 12.2 12.0 - 15.0 g/dL   HCT 37.4 36.0 - 46.0 %   MCV 88.2 78.0 - 100.0 fL   MCH 28.8 26.0 - 34.0 pg   MCHC 32.6 30.0 - 36.0 g/dL   RDW 15.7 (H) 11.5 - 15.5 %   Platelets 281 150 - 400 K/uL  Glucose, capillary     Status: Abnormal   Collection Time: 07/07/15  7:48 AM  Result Value Ref Range   Glucose-Capillary 132 (H) 65 - 99 mg/dL  Glucose, capillary     Status: Abnormal   Collection Time: 07/07/15 12:15 PM  Result Value Ref Range   Glucose-Capillary 147 (H) 65 - 99 mg/dL  Glucose, capillary     Status: Abnormal   Collection Time: 07/07/15  4:23 PM  Result Value Ref Range   Glucose-Capillary 149 (H) 65 - 99 mg/dL    Assessment/Plan: Acute pancreatitis Clinically resolved. Concern for a genetic component giving significant elevation despite medication and recurrent infections. Discussed  medication options -- would like to switch fenofibrate to gemfibrozil but would have to consider statin dose. Spoke with PharmD who felt if switch was made statin would need tobe discontinued which would not help TGL. Discussed Lovaza and Vascepa but patient without insurance. Recommended addition of OTC fish oils until patient can be seen by lipid clinic. Urgent referral to lipid clinic placed. Will check CBC, lipase and lipid panel today.

## 2015-07-25 ENCOUNTER — Telehealth: Payer: Self-pay | Admitting: Cardiovascular Disease

## 2015-07-26 ENCOUNTER — Ambulatory Visit: Payer: Self-pay | Admitting: Cardiovascular Disease

## 2015-07-26 NOTE — Progress Notes (Signed)
Cardiology Office Note   Date:  07/27/2015   ID:  Teresa Parks, DOB 05-21-1971, MRN [id number]  PCP:  [name], PA-C  Cardiologist:   Sharol Harness, MD   Chief Complaint  Patient presents with  . New Evaluation    NO COMPLAINTS.      History of Present Illness: Teresa Parks is a 45 y.o. female with diabetes mellitus type 2, hyperltriglyceridemia, recurrent pancreatitis, obesity, and ongoing tobacco abuse who presents for anagement of hypertriglyceridemia.  Teresa Parks was first diagnosed with hypertriglyceridemia in July 2015. At that same time she was diagnosed with diabetes. She has tried to change her diet.  She stopped eating as much red meat.  She also stopped drinking regular sodas and now drinks only diet soda.  She had another flare of pancreatitis in September 2015.  She was admitted to the hospital December 19-22, 2016 with pancreatitis due to hypertriglyceridemia.  Her triglycerides at the time of admission were 4062.  Teresa Parks last checked 04/2015 it was 384.  Although she was taking atorvastatin and fenofibrate, she admitted non-adherence to dietary modification.    Teresa Parks was evaluated by her PCP, Teresa Parks, on 07/22/15.  At that appointment she was switched from fenofibrate to gemfibrozil was considered.  However, this did not occur because of concern that she would have to stop or change her statin.  She is unable to afford Lovaza or Vascepa.  Fish oil was added to her regimen.  At that appointment her triglycerides were reduced to 524.  Neither of her parents have hypertriglyceridemia.  She is unsure whether her daughter has been tested.   Teresa Parks quit smoking in October.  She does not drink alcohol.    Teresa Parks  typically exercises regularly but has not done so in the last 2 weeks. She has been busy with the holidays and her husband has been sick.She likes to work on on the elliptical. She also walks at work.  He denies any chest pain, shortness  of breath, lightheadedness, dizziness, lower extremity edema, orthopnea or PND.   Past Medical History  Diagnosis Date  . Pancreatitis, acute   . Diabetes mellitus type II, controlled ([place])   . Hyperlipidemia   . Chronic ear infection     Past Surgical History  Procedure Laterality Date  . Hernia repair    . Knee surgery    . Wisdom tooth extraction       Current Outpatient Prescriptions  Medication Sig Dispense Refill  . atorvastatin (LIPITOR) 80 MG tablet Take 1 tablet (80 mg total) by mouth daily. 30 tablet 1  . fenofibrate 160 MG tablet Take 160 mg by mouth daily.    . fluticasone (FLONASE) 50 MCG/ACT nasal spray Place 2 sprays into both nostrils daily. 16 g 6  . glucose blood test strip Use as instructed 100 each 12  . Insulin Pen Needle (1ST CHOICE PEN NEEDLES) 31G X 6 MM MISC For insulin injections as directed 100 each 0  . Lancets (FREESTYLE) lancets Use as instructed 100 each 12  . LEVEMIR FLEXTOUCH 100 UNIT/ML Pen INJECT 12 UNITS UNDER THE SKIN DAILY AT 10PM 15 mL 3  . lisinopril (PRINIVIL,ZESTRIL) 10 MG tablet Take 1 tablet (10 mg total) by mouth daily. 30 tablet 2  . metFORMIN (GLUCOPHAGE) 500 MG tablet TAKE ONE TABLET BY MOUTH TWICE DAILY WITH MEALS 60 tablet 5  . Omega-3 Fatty Acids (FISH OIL) 1000 MG CAPS Take 1,000 mg by  mouth.    . promethazine (PHENERGAN) 25 MG tablet Take 1 tablet (25 mg total) by mouth every 6 (six) hours as needed for nausea or vomiting. 15 tablet 0   No current facility-administered medications for this visit.    Allergies:   Novocain    Social History:  The patient  reports that she quit smoking about 2 months ago. She does not have any smokeless tobacco history on file. She reports that she does not drink alcohol or use illicit drugs.   Family History:  The patient's family history includes Alzheimer's disease in her maternal grandfather; Diabetes in her father, mother, and paternal uncle; Healthy in her brother and daughter; Heart  attack (age of onset: 57) in her father; Hypertension in her mother.    ROS:  Please see the history of present illness.   Otherwise, review of systems are positive for none.   All other systems are reviewed and negative.    PHYSICAL EXAM: VS:  BP 128/76 mmHg  Pulse 92  Ht 5\' 5"  (1.651 m)  Wt 94.802 kg (209 lb)  BMI 34.78 kg/m2  LMP 07/04/2015 , BMI Body mass index is 34.78 kg/(m^2). GENERAL:  Well appearing HEENT:  Pupils equal round and reactive, fundi not visualized, oral mucosa unremarkable.  Ruddy face. NECK:  No jugular venous distention, waveform within normal limits, carotid upstroke brisk and symmetric, no bruits, no thyromegaly LYMPHATICS:  No cervical adenopathy LUNGS:  Clear to auscultation bilaterally HEART:  RRR.  PMI not displaced or sustained,S1 and S2 within normal limits, no S3, no S4, no clicks, no rubs, no murmurs ABD:  Flat, positive bowel sounds normal in frequency in pitch, no bruits, no rebound, no guarding, no midline pulsatile mass, no hepatomegaly, no splenomegaly EXT:  2 plus pulses throughout, no edema, no cyanosis no clubbing SKIN:  No rashes no nodules NEURO:  Cranial nerves II through XII grossly intact, motor grossly intact throughout PSYCH:  Cognitively intact, oriented to person place and time   EKG:  EKG is ordered today. The ekg ordered today demonstrates Sinus rhythm. Rate 92 bpm.   Recent Labs: 07/05/2015: ALT 17 07/06/2015: Magnesium 1.8; TSH 1.377 07/07/2015: BUN 9; Creatinine, Ser 0.52; Potassium 3.5; Sodium 135 07/22/2015: Hemoglobin 14.6; Platelets 411.0*    Lipid Panel    Component Value Date/Time   CHOL 111 07/22/2015 1402   TRIG * 07/22/2015 1402    524.0 Triglyceride is over 400; calculations on Lipids are invalid.   HDL 26.40* 07/22/2015 1402   CHOLHDL 4 07/22/2015 1402   VLDL UNABLE TO CALCULATE IF TRIGLYCERIDE OVER 400 mg/dL 07/05/2015 0137   LDLCALC UNABLE TO CALCULATE IF TRIGLYCERIDE OVER 400 mg/dL 07/05/2015 0137    LDLDIRECT 31.0 07/22/2015 1402      Wt Readings from Last 3 Encounters:  07/27/15 94.802 kg (209 lb)  07/22/15 95.346 kg (210 lb 3.2 oz)  07/07/15 98.127 kg (216 lb 5.3 oz)      ASSESSMENT AND PLAN:  # Hypertriglyceridemia:  Ms. Kundu is doing well on maximum dosing of atorvastatin fenofibrate.  Her triglyceridesare down to 524 from over 4000.  Fish oil  was appropriately added to her regimen. We will repeat her lipids in 2 months to assess for improvement. We'll have her follow up lipid clniic with Tommy Medal, Pharm D.  We discussed the importance of exercise and reducing refined sugars in her diet. She does not drink any alcohol.  I recommended that her daughter had a lipid panel checked, as there  may be a familial component to her disease. She denies any first degree relatives with hyperlipidemia.  # Tobacco abuse: Ms. Rawlings was congratulated on her recent tobacco cessation.  # Hypertension:  Blood pressure well-controlled. Continue lisinopril.    Current medicines are reviewed at length with the patient today.  The patient does not have concerns regarding medicines.  The following changes have been made:  no change  Labs/ tests ordered today include: `  Orders Placed This Encounter  Procedures  . Lipid panel  . Hepatic function panel  . EKG 12-Lead     Disposition:   FU with Indiyah Paone C. Oval Linsey, MD, [facility] in 1 year.  Follow up with Tommy Medal in 2 months.   This note was written with the assistance of speech recognition software.  Please excuse any transcriptional errors.  Signed, Anothony Bursch C. Oval Linsey, MD, [facility]  07/27/2015 3:10 PM    [facility]

## 2015-07-26 NOTE — Telephone Encounter (Signed)
Close encounter 

## 2015-07-27 ENCOUNTER — Encounter: Payer: Self-pay | Admitting: Cardiovascular Disease

## 2015-07-27 ENCOUNTER — Ambulatory Visit (INDEPENDENT_AMBULATORY_CARE_PROVIDER_SITE_OTHER): Payer: Self-pay | Admitting: Cardiovascular Disease

## 2015-07-27 VITALS — BP 128/76 | HR 92 | Ht 65.0 in | Wt 209.0 lb

## 2015-07-27 DIAGNOSIS — Z720 Tobacco use: Secondary | ICD-10-CM

## 2015-07-27 DIAGNOSIS — I10 Essential (primary) hypertension: Secondary | ICD-10-CM

## 2015-07-27 DIAGNOSIS — Z79899 Other long term (current) drug therapy: Secondary | ICD-10-CM

## 2015-07-27 DIAGNOSIS — E785 Hyperlipidemia, unspecified: Secondary | ICD-10-CM

## 2015-07-27 NOTE — Patient Instructions (Signed)
IN 2 MONTHS PLEASE - GO TO LAB  ABOUT 2-3 DAYS PRIOR TO YOUR VISIT WITH [place]  Your physician recommends that you schedule a follow-up appointment in 2 [place].   Your physician wants you to follow-up in  [place].  You will receive a reminder letter in the mail two months in advance. If you don't receive a letter, please call our office to schedule the follow-up appointment.  If you need a refill on your cardiac medications before your next appointment, please call your pharmacy.

## 2015-07-29 ENCOUNTER — Encounter: Payer: Self-pay | Admitting: Physician Assistant

## 2015-07-29 ENCOUNTER — Ambulatory Visit (INDEPENDENT_AMBULATORY_CARE_PROVIDER_SITE_OTHER): Payer: Self-pay | Admitting: Physician Assistant

## 2015-07-29 VITALS — BP 143/89 | HR 109 | Temp 98.7°F | Ht 65.0 in | Wt 210.4 lb

## 2015-07-29 DIAGNOSIS — J209 Acute bronchitis, unspecified: Secondary | ICD-10-CM

## 2015-07-29 MED ORDER — AZITHROMYCIN 250 MG PO TABS: ORAL_TABLET | ORAL | Status: DC

## 2015-07-29 MED ORDER — HYDROCODONE-HOMATROPINE 5-1.5 MG/5ML PO SYRP: 5.0000 mL | ORAL_SOLUTION | Freq: Three times a day (TID) | ORAL | Status: DC | PRN

## 2015-07-29 NOTE — Progress Notes (Signed)
Pre visit review using our clinic review tool, if applicable. No additional management support is needed unless otherwise documented below in the visit note. 

## 2015-07-29 NOTE — Progress Notes (Signed)
Patient presents to clinic today c/o chest congestion with cough productive of thick green sputum starting last Wednesday and worsening since onset. Endorses significant bilateral ear pressure and L ear pain. His history significant for bronchitis and ear infections. Denies chest pain or SOB.   Past Medical History  Diagnosis Date  . Pancreatitis, acute   . Diabetes mellitus type II, controlled ([place])   . Hyperlipidemia   . Chronic ear infection     Current Outpatient Prescriptions on File Prior to Visit  Medication Sig Dispense Refill  . atorvastatin (LIPITOR) 80 MG tablet Take 1 tablet (80 mg total) by mouth daily. 30 tablet 1  . fenofibrate 160 MG tablet Take 160 mg by mouth daily.    . fluticasone (FLONASE) 50 MCG/ACT nasal spray Place 2 sprays into both nostrils daily. 16 g 6  . glucose blood test strip Use as instructed 100 each 12  . Insulin Pen Needle (1ST CHOICE PEN NEEDLES) 31G X 6 MM MISC For insulin injections as directed 100 each 0  . Lancets (FREESTYLE) lancets Use as instructed 100 each 12  . LEVEMIR FLEXTOUCH 100 UNIT/ML Pen INJECT 12 UNITS UNDER THE SKIN DAILY AT 10PM 15 mL 3  . lisinopril (PRINIVIL,ZESTRIL) 10 MG tablet Take 1 tablet (10 mg total) by mouth daily. 30 tablet 2  . metFORMIN (GLUCOPHAGE) 500 MG tablet TAKE ONE TABLET BY MOUTH TWICE DAILY WITH MEALS 60 tablet 5  . Omega-3 Fatty Acids (FISH OIL) 1000 MG CAPS Take 1,000 mg by mouth.    . promethazine (PHENERGAN) 25 MG tablet Take 1 tablet (25 mg total) by mouth every 6 (six) hours as needed for nausea or vomiting. 15 tablet 0   No current facility-administered medications on file prior to visit.    Allergies  Allergen Reactions  . Novocain [Procaine] Nausea And Vomiting    Family History  Problem Relation Age of Onset  . Heart attack Father 6    Deceased  . Diabetes Father   . Diabetes Paternal Uncle   . Diabetes Mother   . Hypertension Mother     Living  . Alzheimer's disease Maternal  Grandfather   . Healthy Brother   . Healthy Daughter     Social History   Social History  . Marital Status: Married    Spouse Name: N/A  . Number of Children: N/A  . Years of Education: N/A   Social History Main Topics  . Smoking status: Former Smoker    Quit date: 05/22/2015  . Smokeless tobacco: None     Comment: Pt has not smoked in 1.5 Wks  . Alcohol Use: No  . Drug Use: No  . Sexual Activity: Not Asked   Other Topics Concern  . None   Social History Narrative   Review of Systems - See HPI.  All other ROS are negative.  BP 143/89 mmHg  Pulse 109  Temp(Src) 98.7 F (37.1 C) (Oral)  Ht 5' 5" (1.651 m)  Wt 210 lb 6.4 oz (95.437 kg)  BMI 35.01 kg/m2  SpO2 98%  LMP 07/29/2015  Physical Exam  Constitutional: She is well-developed, well-nourished, and in no distress.  HENT:  Head: Normocephalic and atraumatic.  Right Ear: External ear normal.  Left Ear: External ear normal.  Nose: Nose normal.  Mouth/Throat: No oropharyngeal exudate.  Eyes: Conjunctivae are normal.  Cardiovascular: Normal rate, regular rhythm, normal heart sounds and intact distal pulses.   Pulmonary/Chest: Effort normal and breath sounds normal. No respiratory distress. She  has no wheezes. She has no rales. She exhibits no tenderness.  Neurological: She is alert.  Skin: Skin is warm and dry. No rash noted.  Psychiatric: Affect normal.  Vitals reviewed.   Recent Results (from the past 2160 hour(s))  Urinalysis, Routine w reflex microscopic (not at [facility])     Status: Abnormal   Collection Time: 07/04/15  2:00 PM  Result Value Ref Range   Color, Urine AMBER (A) YELLOW    Comment: BIOCHEMICALS MAY BE AFFECTED BY COLOR   APPearance TURBID (A) CLEAR   Specific Gravity, Urine >1.046 (H) 1.005 - 1.030   pH 5.5 5.0 - 8.0   Glucose, UA >1000 (A) NEGATIVE mg/dL   Hgb urine dipstick SMALL (A) NEGATIVE   Bilirubin Urine SMALL (A) NEGATIVE   Ketones, ur 15 (A) NEGATIVE mg/dL   Protein, ur >300 (A)  NEGATIVE mg/dL   Nitrite NEGATIVE NEGATIVE   Leukocytes, UA NEGATIVE NEGATIVE  Urine microscopic-add on     Status: Abnormal   Collection Time: 07/04/15  2:00 PM  Result Value Ref Range   Squamous Epithelial / LPF 0-5 (A) NONE SEEN   WBC, UA 0-5 0 - 5 WBC/hpf   RBC / HPF 0-5 0 - 5 RBC/hpf   Bacteria, UA FEW (A) NONE SEEN   Crystals CA OXALATE CRYSTALS (A) NEGATIVE   Urine-Other AMORPHOUS URATES/PHOSPHATES   POC CBG, ED     Status: Abnormal   Collection Time: 07/04/15  2:12 PM  Result Value Ref Range   Glucose-Capillary 329 (H) 65 - 99 mg/dL   Comment 1 Notify RN    Comment 2 Document in Chart   CBC WITH DIFFERENTIAL     Status: Abnormal   Collection Time: 07/04/15  2:25 PM  Result Value Ref Range   WBC 12.6 (H) 4.0 - 10.5 K/uL   RBC 5.14 (H) 3.87 - 5.11 MIL/uL   Hemoglobin 15.1 (H) 12.0 - 15.0 g/dL   HCT 42.5 36.0 - 46.0 %   MCV 82.7 78.0 - 100.0 fL   MCH 29.4 26.0 - 34.0 pg   MCHC 35.5 30.0 - 36.0 g/dL    Comment: CORRECTED FOR INTERFERING SUBSTANCE   RDW 15.1 11.5 - 15.5 %   Platelets 366 150 - 400 K/uL   Neutrophils Relative % 83 %   Lymphocytes Relative 13 %   Monocytes Relative 1 %   Eosinophils Relative 1 %   Basophils Relative 1 %   Band Neutrophils 1 %   Neutro Abs 10.7 (H) 1.7 - 7.7 K/uL   Lymphs Abs 1.6 0.7 - 4.0 K/uL   Monocytes Absolute 0.1 0.1 - 1.0 K/uL   Eosinophils Absolute 0.1 0.0 - 0.7 K/uL   Basophils Absolute 0.1 0.0 - 0.1 K/uL   WBC Morphology ATYPICAL LYMPHOCYTES    Smear Review LARGE PLATELETS PRESENT     Comment: GIANT PLATELETS SEEN  Basic metabolic panel     Status: Abnormal   Collection Time: 07/04/15  2:55 PM  Result Value Ref Range   Sodium 137 135 - 145 mmol/L   Potassium 4.3 3.5 - 5.1 mmol/L   Chloride 100 (L) 101 - 111 mmol/L   CO2 23 22 - 32 mmol/L   Glucose, Bld 340 (H) 65 - 99 mg/dL   BUN 12 6 - 20 mg/dL   Creatinine, Ser 0.78 0.44 - 1.00 mg/dL   Calcium 9.0 8.9 - 10.3 mg/dL   GFR calc non Af Amer >60 >60 mL/min   GFR calc Af  Amer >60 >60 mL/min    Comment: (NOTE) The eGFR has been calculated using the CKD EPI equation. This calculation has not been validated in all clinical situations. eGFR's persistently <60 mL/min signify possible Chronic Kidney Disease.    Anion gap 14 5 - 15    Comment: Performed at [facility]  Amylase     Status: Abnormal   Collection Time: 07/04/15  2:55 PM  Result Value Ref Range   Amylase 247 (H) 28 - 100 U/L    Comment: Performed at [facility]  Lipase, blood     Status: Abnormal   Collection Time: 07/04/15  2:55 PM  Result Value Ref Range   Lipase 573 (H) 11 - 51 U/L    Comment: RESULTS CONFIRMED BY MANUAL DILUTION Performed at [facility]   POC CBG, ED     Status: Abnormal   Collection Time: 07/04/15  5:01 PM  Result Value Ref Range   Glucose-Capillary 281 (H) 65 - 99 mg/dL  Hepatic function panel     Status: Abnormal   Collection Time: 07/04/15  7:53 PM  Result Value Ref Range   Total Protein 6.7 6.5 - 8.1 g/dL   Albumin 3.4 (L) 3.5 - 5.0 g/dL   AST 27 15 - 41 U/L   ALT 24 14 - 54 U/L   Alkaline Phosphatase 100 38 - 126 U/L   Total Bilirubin 1.3 (H) 0.3 - 1.2 mg/dL   Bilirubin, Direct 0.3 0.1 - 0.5 mg/dL   Indirect Bilirubin 1.0 (H) 0.3 - 0.9 mg/dL    Comment: Performed at [facility]  CBG monitoring, ED     Status: Abnormal   Collection Time: 07/04/15  7:57 PM  Result Value Ref Range   Glucose-Capillary 276 (H) 65 - 99 mg/dL  Glucose, capillary     Status: Abnormal   Collection Time: 07/05/15  1:16 AM  Result Value Ref Range   Glucose-Capillary 314 (H) 65 - 99 mg/dL  Lipid panel     Status: Abnormal   Collection Time: 07/05/15  1:37 AM  Result Value Ref Range   Cholesterol 394 (H) 0 - 200 mg/dL   Triglycerides 4062 (H) <150 mg/dL   HDL NOT REPORTED DUE TO HIGH TRIGLYCERIDES >40 mg/dL   Total CHOL/HDL Ratio NOT REPORTED DUE TO HIGH TRIGLYCERIDES RATIO   VLDL UNABLE TO CALCULATE IF TRIGLYCERIDE OVER 400 mg/dL 0 - 40 mg/dL    LDL Cholesterol UNABLE TO CALCULATE IF TRIGLYCERIDE OVER 400 mg/dL 0 - 99 mg/dL  Troponin I     Status: None   Collection Time: 07/05/15  1:37 AM  Result Value Ref Range   Troponin I <0.03 <0.031 ng/mL    Comment:        NO INDICATION OF MYOCARDIAL INJURY.   Comprehensive metabolic panel     Status: Abnormal   Collection Time: 07/05/15  1:37 AM  Result Value Ref Range   Sodium 135 135 - 145 mmol/L   Potassium 3.9 3.5 - 5.1 mmol/L   Chloride 104 101 - 111 mmol/L   CO2 20 (L) 22 - 32 mmol/L   Glucose, Bld 323 (H) 65 - 99 mg/dL   BUN 7 6 - 20 mg/dL   Creatinine, Ser 0.57 0.44 - 1.00 mg/dL   Calcium 8.1 (L) 8.9 - 10.3 mg/dL   Total Protein 6.0 (L) 6.5 - 8.1 g/dL   Albumin 2.8 (L) 3.5 - 5.0 g/dL   AST 18 15 - 41 U/L  ALT 17 14 - 54 U/L   Alkaline Phosphatase 88 38 - 126 U/L   Total Bilirubin 1.0 0.3 - 1.2 mg/dL   GFR calc non Af Amer >60 >60 mL/min   GFR calc Af Amer >60 >60 mL/min    Comment: (NOTE) The eGFR has been calculated using the CKD EPI equation. This calculation has not been validated in all clinical situations. eGFR's persistently <60 mL/min signify possible Chronic Kidney Disease.    Anion gap 11 5 - 15  CBC WITH DIFFERENTIAL     Status: Abnormal   Collection Time: 07/05/15  1:37 AM  Result Value Ref Range   WBC 13.6 (H) 4.0 - 10.5 K/uL   RBC 5.08 3.87 - 5.11 MIL/uL   Hemoglobin 15.0 12.0 - 15.0 g/dL   HCT 43.1 36.0 - 46.0 %   MCV 84.8 78.0 - 100.0 fL   MCH 29.5 26.0 - 34.0 pg   MCHC 34.8 30.0 - 36.0 g/dL   RDW 15.0 11.5 - 15.5 %   Platelets 337 150 - 400 K/uL   Neutrophils Relative % 89 %   Neutro Abs 12.1 (H) 1.7 - 7.7 K/uL   Lymphocytes Relative 7 %   Lymphs Abs 0.9 0.7 - 4.0 K/uL   Monocytes Relative 4 %   Monocytes Absolute 0.6 0.1 - 1.0 K/uL   Eosinophils Relative 0 %   Eosinophils Absolute 0.0 0.0 - 0.7 K/uL   Basophils Relative 0 %   Basophils Absolute 0.0 0.0 - 0.1 K/uL  Glucose, capillary     Status: Abnormal   Collection Time: 07/05/15   5:07 AM  Result Value Ref Range   Glucose-Capillary 313 (H) 65 - 99 mg/dL  Pregnancy, urine     Status: None   Collection Time: 07/05/15  6:15 AM  Result Value Ref Range   Preg Test, Ur NEGATIVE NEGATIVE    Comment:        THE SENSITIVITY OF THIS METHODOLOGY IS >20 mIU/mL.   Glucose, capillary     Status: Abnormal   Collection Time: 07/05/15  7:58 AM  Result Value Ref Range   Glucose-Capillary 297 (H) 65 - 99 mg/dL  Glucose, capillary     Status: Abnormal   Collection Time: 07/05/15 11:42 AM  Result Value Ref Range   Glucose-Capillary 250 (H) 65 - 99 mg/dL  Glucose, capillary     Status: Abnormal   Collection Time: 07/05/15  4:50 PM  Result Value Ref Range   Glucose-Capillary 250 (H) 65 - 99 mg/dL  Glucose, capillary     Status: Abnormal   Collection Time: 07/05/15  8:58 PM  Result Value Ref Range   Glucose-Capillary 224 (H) 65 - 99 mg/dL  Glucose, capillary     Status: Abnormal   Collection Time: 07/06/15 12:30 AM  Result Value Ref Range   Glucose-Capillary 210 (H) 65 - 99 mg/dL  Glucose, capillary     Status: Abnormal   Collection Time: 07/06/15  4:48 AM  Result Value Ref Range   Glucose-Capillary 206 (H) 65 - 99 mg/dL   Comment 1 Notify RN   CBC     Status: Abnormal   Collection Time: 07/06/15  5:50 AM  Result Value Ref Range   WBC 13.0 (H) 4.0 - 10.5 K/uL   RBC 4.49 3.87 - 5.11 MIL/uL   Hemoglobin 13.0 12.0 - 15.0 g/dL   HCT 38.9 36.0 - 46.0 %   MCV 86.6 78.0 - 100.0 fL   MCH 29.0 26.0 - 34.0  pg   MCHC 33.4 30.0 - 36.0 g/dL   RDW 15.8 (H) 11.5 - 15.5 %   Platelets 304 150 - 400 K/uL  Basic metabolic panel     Status: Abnormal   Collection Time: 07/06/15  5:50 AM  Result Value Ref Range   Sodium 133 (L) 135 - 145 mmol/L   Potassium 3.4 (L) 3.5 - 5.1 mmol/L   Chloride 102 101 - 111 mmol/L   CO2 22 22 - 32 mmol/L   Glucose, Bld 199 (H) 65 - 99 mg/dL   BUN 9 6 - 20 mg/dL   Creatinine, Ser 0.56 0.44 - 1.00 mg/dL   Calcium 8.3 (L) 8.9 - 10.3 mg/dL   GFR calc  non Af Amer >60 >60 mL/min   GFR calc Af Amer >60 >60 mL/min    Comment: (NOTE) The eGFR has been calculated using the CKD EPI equation. This calculation has not been validated in all clinical situations. eGFR's persistently <60 mL/min signify possible Chronic Kidney Disease.    Anion gap 9 5 - 15  Magnesium     Status: None   Collection Time: 07/06/15  5:50 AM  Result Value Ref Range   Magnesium 1.8 1.7 - 2.4 mg/dL  TSH     Status: None   Collection Time: 07/06/15  5:50 AM  Result Value Ref Range   TSH 1.377 0.350 - 4.500 uIU/mL  Lipase, blood     Status: Abnormal   Collection Time: 07/06/15  5:50 AM  Result Value Ref Range   Lipase 68 (H) 11 - 51 U/L  Glucose, capillary     Status: Abnormal   Collection Time: 07/06/15  8:09 AM  Result Value Ref Range   Glucose-Capillary 180 (H) 65 - 99 mg/dL  Glucose, capillary     Status: Abnormal   Collection Time: 07/06/15 12:17 PM  Result Value Ref Range   Glucose-Capillary 153 (H) 65 - 99 mg/dL  Glucose, capillary     Status: Abnormal   Collection Time: 07/06/15  5:42 PM  Result Value Ref Range   Glucose-Capillary 156 (H) 65 - 99 mg/dL  Glucose, capillary     Status: Abnormal   Collection Time: 07/06/15  8:10 PM  Result Value Ref Range   Glucose-Capillary 197 (H) 65 - 99 mg/dL   Comment 1 Notify RN    Comment 2 Document in Chart   Glucose, capillary     Status: Abnormal   Collection Time: 07/06/15 11:38 PM  Result Value Ref Range   Glucose-Capillary 140 (H) 65 - 99 mg/dL  Glucose, capillary     Status: Abnormal   Collection Time: 07/07/15  4:04 AM  Result Value Ref Range   Glucose-Capillary 156 (H) 65 - 99 mg/dL  Lipase, blood     Status: Abnormal   Collection Time: 07/07/15  5:18 AM  Result Value Ref Range   Lipase 52 (H) 11 - 51 U/L  Basic metabolic panel     Status: Abnormal   Collection Time: 07/07/15  5:18 AM  Result Value Ref Range   Sodium 135 135 - 145 mmol/L   Potassium 3.5 3.5 - 5.1 mmol/L   Chloride 101 101 -  111 mmol/L   CO2 26 22 - 32 mmol/L   Glucose, Bld 136 (H) 65 - 99 mg/dL   BUN 9 6 - 20 mg/dL   Creatinine, Ser 0.52 0.44 - 1.00 mg/dL   Calcium 8.2 (L) 8.9 - 10.3 mg/dL   GFR calc non Af Amer >  60 >60 mL/min   GFR calc Af Amer >60 >60 mL/min    Comment: (NOTE) The eGFR has been calculated using the CKD EPI equation. This calculation has not been validated in all clinical situations. eGFR's persistently <60 mL/min signify possible Chronic Kidney Disease.    Anion gap 8 5 - 15  CBC     Status: Abnormal   Collection Time: 07/07/15  5:18 AM  Result Value Ref Range   WBC 10.2 4.0 - 10.5 K/uL   RBC 4.24 3.87 - 5.11 MIL/uL   Hemoglobin 12.2 12.0 - 15.0 g/dL   HCT 37.4 36.0 - 46.0 %   MCV 88.2 78.0 - 100.0 fL   MCH 28.8 26.0 - 34.0 pg   MCHC 32.6 30.0 - 36.0 g/dL   RDW 15.7 (H) 11.5 - 15.5 %   Platelets 281 150 - 400 K/uL  Glucose, capillary     Status: Abnormal   Collection Time: 07/07/15  7:48 AM  Result Value Ref Range   Glucose-Capillary 132 (H) 65 - 99 mg/dL  Glucose, capillary     Status: Abnormal   Collection Time: 07/07/15 12:15 PM  Result Value Ref Range   Glucose-Capillary 147 (H) 65 - 99 mg/dL  Glucose, capillary     Status: Abnormal   Collection Time: 07/07/15  4:23 PM  Result Value Ref Range   Glucose-Capillary 149 (H) 65 - 99 mg/dL  CBC     Status: Abnormal   Collection Time: 07/22/15  2:02 PM  Result Value Ref Range   WBC 9.9 4.0 - 10.5 K/uL   RBC 5.20 (H) 3.87 - 5.11 Mil/uL   Platelets 411.0 (H) 150.0 - 400.0 K/uL   Hemoglobin 14.6 12.0 - 15.0 g/dL   HCT 44.0 36.0 - 46.0 %   MCV 84.7 78.0 - 100.0 fl   MCHC 33.1 30.0 - 36.0 g/dL   RDW 16.4 (H) 11.5 - 15.5 %  Lipase     Status: Abnormal   Collection Time: 07/22/15  2:02 PM  Result Value Ref Range   Lipase 70.0 (H) 11.0 - 59.0 U/L  Lipid Profile     Status: Abnormal   Collection Time: 07/22/15  2:02 PM  Result Value Ref Range   Cholesterol 111 0 - 200 mg/dL    Comment: ATP III Classification        Desirable:  < 200 mg/dL               Borderline High:  200 - 239 mg/dL          High:  > = 240 mg/dL   Triglycerides (H) 0.0 - 149.0 mg/dL    524.0 Triglyceride is over 400; calculations on Lipids are invalid.    Comment: Normal:  <150 mg/dLBorderline High:  150 - 199 mg/dL   HDL 26.40 (L) >39.00 mg/dL   Total CHOL/HDL Ratio 4     Comment:                Men          Women1/2 Average Risk     3.4          3.3Average Risk          5.0          4.42X Average Risk          9.6          7.13X Average Risk          15.0  11.0                      LDL cholesterol, direct     Status: None   Collection Time: 07/22/15  2:02 PM  Result Value Ref Range   Direct LDL 31.0 mg/dL    Comment: Optimal:  <100 mg/dLNear or Above Optimal:  100-129 mg/dLBorderline High:  130-159 mg/dLHigh:  160-189 mg/dLVery High:  >190 mg/dL    Assessment/Plan: Acute bronchitis Rx AZithromycin. Rx hycodan. Increase fluids. Mucinex as directed. Follow-up if symptoms are not resolving.

## 2015-07-29 NOTE — Assessment & Plan Note (Signed)
Rx AZithromycin. Rx hycodan. Increase fluids. Mucinex as directed. Follow-up if symptoms are not resolving.

## 2015-07-29 NOTE — Patient Instructions (Signed)
Take antibiotic (Azithromycin) as directed.  Increase fluids.  Get plenty of rest. Use Mucinex for congestion. Use the cough syrup as directed. Take a daily probiotic (I recommend Align or Culturelle, but even Activia Yogurt may be beneficial).  A humidifier placed in the bedroom may offer some relief for a dry, scratchy throat of nasal irritation.  Read information below on acute bronchitis. Please call or return to clinic if symptoms are not improving.  Acute Bronchitis Bronchitis is when the airways that extend from the windpipe into the lungs get red, puffy, and painful (inflamed). Bronchitis often causes thick spit (mucus) to develop. This leads to a cough. A cough is the most common symptom of bronchitis. In acute bronchitis, the condition usually begins suddenly and goes away over time (usually in 2 weeks). Smoking, allergies, and asthma can make bronchitis worse. Repeated episodes of bronchitis may cause more lung problems.  HOME CARE  Rest.  Drink enough fluids to keep your pee (urine) clear or pale yellow (unless you need to limit fluids as told by your doctor).  Only take over-the-counter or prescription medicines as told by your doctor.  Avoid smoking and secondhand smoke. These can make bronchitis worse. If you are a smoker, think about using nicotine gum or skin patches. Quitting smoking will help your lungs heal faster.  Reduce the chance of getting bronchitis again by:  Washing your hands often.  Avoiding people with cold symptoms.  Trying not to touch your hands to your mouth, nose, or eyes.  Follow up with your doctor as told.  GET HELP IF: Your symptoms do not improve after 1 week of treatment. Symptoms include:  Cough.  Fever.  Coughing up thick spit.  Body aches.  Chest congestion.  Chills.  Shortness of breath.  Sore throat.  GET HELP RIGHT AWAY IF:   You have an increased fever.  You have chills.  You have severe shortness of breath.  You  have bloody thick spit (sputum).  You throw up (vomit) often.  You lose too much body fluid (dehydration).  You have a severe headache.  You faint.  MAKE SURE YOU:   Understand these instructions.  Will watch your condition.  Will get help right away if you are not doing well or get worse. Document Released: 12/19/2007 Document Revised: 03/04/2013 Document Reviewed: 12/23/2012 Exit[employer] Patient Information 2015 [employer]. This information is not intended to replace advice given to you by your health care provider. Make sure you discuss any questions you have with your health care provider.   

## 2015-08-17 ENCOUNTER — Other Ambulatory Visit: Payer: Self-pay | Admitting: Physician Assistant

## 2015-08-25 ENCOUNTER — Ambulatory Visit: Payer: Self-pay | Admitting: Cardiovascular Disease

## 2015-09-20 LAB — HEPATIC FUNCTION PANEL
ALT: 15 U/L (ref 6–29)
AST: 12 U/L (ref 10–35)
Albumin: 3.8 g/dL (ref 3.6–5.1)
Alkaline Phosphatase: 91 U/L (ref 33–115)
Bilirubin, Direct: <0.1 mg/dL (ref <=0.2)
TOTAL PROTEIN: 6.8 g/dL (ref 6.1–8.1)
Total Bilirubin: 0.3 mg/dL (ref 0.2–1.2)

## 2015-09-20 LAB — LIPID PANEL
Cholesterol: 186 mg/dL (ref 125–200)
HDL: 23 mg/dL — ABNORMAL LOW (ref >=46)
TRIGLYCERIDES: 1529 mg/dL — AB (ref <150)
Total CHOL/HDL Ratio: 8.1 Ratio — ABNORMAL HIGH (ref <=5.0)

## 2015-09-20 LAB — LDL CHOLESTEROL, DIRECT: Direct LDL: 27 mg/dL (ref <130)

## 2015-09-22 ENCOUNTER — Encounter: Payer: Self-pay | Admitting: Pharmacist Clinician (PhC)/ Clinical Pharmacy Specialist

## 2015-09-22 ENCOUNTER — Ambulatory Visit (INDEPENDENT_AMBULATORY_CARE_PROVIDER_SITE_OTHER): Payer: Self-pay | Admitting: Pharmacist Clinician (PhC)/ Clinical Pharmacy Specialist

## 2015-09-22 VITALS — Ht 65.0 in | Wt 214.0 lb

## 2015-09-22 DIAGNOSIS — E785 Hyperlipidemia, unspecified: Secondary | ICD-10-CM

## 2015-09-22 NOTE — Progress Notes (Signed)
09/22/2015 Teresa Parks 1971/04/14 [id number]   HPI:  Teresa Parks is a 45 y.o. female patient of Dr Oval Linsey, who presents today for a lipid clinic evaluation.  She has hypertriglyceridemia, with levels as high as 4062 last December.  This resulted in her second hospitalization for pancreatitis.  She is also diabetic, with her last noted A1c of 9.7.  She admits to having problems controlling her diabetes, is now on Levimir, but has not increased the dose to 14 units as recommended by her MD.  Her morning glucose readings have been in the low 200's.  She is expecting her first grandchild in the next two weeks, and trying to change her lifestyle so that she can be healthier and spend time with the new baby.    RF: DM, obesity, tobacco abuse, pancreatitis    Meds: fenofibrate 160 mg qd, fish oil 1 gm qd, atorvastatin 80 mg  Family history: father with MI deceased at 82, both parents DM  Social history:  Trying to quit smoking again - now no more than 5 cigarettes per day; no alcohol  Diet: patient admits to straying from diet since being discharged in December.  Her weakness is [place].  She was drinking diet for some time, but recently started back on regular, 6-8 cans per day.   Trying to get back to her diet of more fish and chicken, rare red meat; also trying to avoid fast food   Exercise:   None specific, still works full time  Labs:  09/2015 - TC 186, TG 1529, HDL 23, LDL(direct) 27 - atorvastatin 80, fenofibrate 160, fish oil 1 gm 07/2015 - TC 111, TG 524, HDL 26.4, LDL(direct) 31 - atorvastatin 80, fenofibrate 160, fish oil 1 gm   Current Outpatient Prescriptions  Medication Sig Dispense Refill  . atorvastatin (LIPITOR) 80 MG tablet Take 1 tablet (80 mg total) by mouth daily. 30 tablet 1  . azithromycin (ZITHROMAX) 250 MG tablet Take 2 tablets on Day 1. Then take 1 tablet daily. 6 tablet 0  . fenofibrate 160 MG tablet Take 160 mg by mouth daily.    . fenofibrate 160 MG tablet TAKE ONE  TABLET BY MOUTH ONCE DAILY 30 tablet 5  . fluticasone (FLONASE) 50 MCG/ACT nasal spray Place 2 sprays into both nostrils daily. 16 g 6  . glucose blood test strip Use as instructed 100 each 12  . HYDROcodone-homatropine (HYCODAN) 5-1.5 MG/5ML syrup Take 5 mLs by mouth every 8 (eight) hours as needed for cough. 120 mL 0  . Insulin Pen Needle (1ST CHOICE PEN NEEDLES) 31G X 6 MM MISC For insulin injections as directed 100 each 0  . Lancets (FREESTYLE) lancets Use as instructed 100 each 12  . LEVEMIR FLEXTOUCH 100 UNIT/ML Pen INJECT 12 UNITS UNDER THE SKIN DAILY AT 10PM 15 mL 3  . lisinopril (PRINIVIL,ZESTRIL) 10 MG tablet Take 1 tablet (10 mg total) by mouth daily. 30 tablet 2  . metFORMIN (GLUCOPHAGE) 500 MG tablet TAKE ONE TABLET BY MOUTH TWICE DAILY WITH MEALS 60 tablet 5  . Omega-3 Fatty Acids (FISH OIL) 1000 MG CAPS Take 1,000 mg by mouth.    . promethazine (PHENERGAN) 25 MG tablet Take 1 tablet (25 mg total) by mouth every 6 (six) hours as needed for nausea or vomiting. 15 tablet 0   No current facility-administered medications for this visit.    Allergies  Allergen Reactions  . Novocain [Procaine] Nausea And Vomiting    Past Medical History  Diagnosis  Date  . Pancreatitis, acute   . Diabetes mellitus type II, controlled ([place])   . Hyperlipidemia   . Chronic ear infection     Height 5\' 5"  (1.651 m), weight 214 lb (97.07 kg).     Tommy Medal PharmD CPP [facility]

## 2015-09-22 NOTE — Patient Instructions (Signed)
Switch fenofibrate to meal time.  Increase fish oil to 4 capsules daily (get [employer] or [place] brands)  Continue to decrease your cigarettes on a weekly or every other week basis  Repeat labs in 2 months, then see me again for follow up

## 2015-09-22 NOTE — Assessment & Plan Note (Addendum)
Patient with hyperlipidemia, specifically hypertriglyceridemia.  Has had 2 episodes of pancreatitis in the past 2 years, the second one with a triglyceride level of 4062.  Had a long discussion about her diabetes, and getting better control.  Urged her to up her insulin to the 14 units that had been prescribed, as she had tapered back when her BS levels dropped to around 200.  Explained that if her DM is not controlled, it will be hard to control the triglycerides, and thus risk another bout of pancreatitis.  Will move her fenofibrate to meal time and increase her fish oil to 4 capsules daily.  Stressed dietary compliance and encouraged her to continue cutting back on the cigarettes.  I will have her repeat labs in 2 months and see her afterward.

## 2015-10-04 ENCOUNTER — Encounter: Payer: Self-pay | Admitting: Physician Assistant

## 2015-10-04 ENCOUNTER — Ambulatory Visit (INDEPENDENT_AMBULATORY_CARE_PROVIDER_SITE_OTHER): Payer: Self-pay | Admitting: Physician Assistant

## 2015-10-04 DIAGNOSIS — E118 Type 2 diabetes mellitus with unspecified complications: Secondary | ICD-10-CM

## 2015-10-04 DIAGNOSIS — E1165 Type 2 diabetes mellitus with hyperglycemia: Secondary | ICD-10-CM

## 2015-10-04 LAB — BASIC METABOLIC PANEL
BUN: 13 mg/dL (ref 6–23)
CALCIUM: 9 mg/dL (ref 8.4–10.5)
CO2: 29 meq/L (ref 19–32)
Chloride: 101 mEq/L (ref 96–112)
Creatinine, Ser: 0.74 mg/dL (ref 0.40–1.20)
GFR: 89.79 mL/min (ref >60.00)
Glucose, Bld: 277 mg/dL — ABNORMAL HIGH (ref 70–99)
POTASSIUM: 4 meq/L (ref 3.5–5.1)
SODIUM: 136 meq/L (ref 135–145)

## 2015-10-04 LAB — HEMOGLOBIN A1C: Hgb A1c MFr Bld: 10 % — ABNORMAL HIGH (ref 4.6–6.5)

## 2015-10-04 NOTE — Patient Instructions (Signed)
Please take an Aleve twice daily or Ibuprofen when needed for pain. Icy hot or Aspercreme to the sore areas. An ACE wrap around the R knee will help with pain. Symptoms should continue to improve and resolve over the next week or so.  Do not forget to stop by lab to update your A1C so your specialist will have it to review.
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
## 2015-10-04 NOTE — Progress Notes (Signed)
Pre visit review using our clinic review tool, if applicable. No additional management support is needed unless otherwise documented below in the visit note. 

## 2015-10-04 NOTE — Assessment & Plan Note (Signed)
Residual muscular tenderness of R foot. No concerning findings on exam. Neuro exam is within normal limits. RICE discussed. Pain medicine reviewed. Follow-up PRN.

## 2015-10-13 ENCOUNTER — Encounter: Payer: Self-pay | Admitting: Physician Assistant

## 2015-10-17 ENCOUNTER — Telehealth: Payer: Self-pay | Admitting: Physician Assistant

## 2015-10-17 ENCOUNTER — Telehealth: Payer: Self-pay | Admitting: *Deleted

## 2015-10-17 ENCOUNTER — Encounter: Payer: Self-pay | Admitting: Physician Assistant

## 2015-10-17 ENCOUNTER — Ambulatory Visit (INDEPENDENT_AMBULATORY_CARE_PROVIDER_SITE_OTHER): Payer: Self-pay | Admitting: Physician Assistant

## 2015-10-17 ENCOUNTER — Other Ambulatory Visit: Payer: Self-pay | Admitting: Physician Assistant

## 2015-10-17 ENCOUNTER — Ambulatory Visit (HOSPITAL_BASED_OUTPATIENT_CLINIC_OR_DEPARTMENT_OTHER)
Admission: RE | Admit: 2015-10-17 | Discharge: 2015-10-17 | Disposition: A | Discharge status: Home / Self Care | Payer: No Typology Code available for payment source | Source: Ambulatory Visit | Attending: Physician Assistant | Admitting: Physician Assistant

## 2015-10-17 DIAGNOSIS — M25561 Pain in right knee: Secondary | ICD-10-CM | POA: Insufficient documentation

## 2015-10-17 DIAGNOSIS — M79671 Pain in right foot: Secondary | ICD-10-CM | POA: Diagnosis not present

## 2015-10-17 MED ORDER — TRAMADOL HCL 50 MG PO TABS: 50.0000 mg | ORAL_TABLET | Freq: Three times a day (TID) | ORAL | Status: DC | PRN

## 2015-10-17 NOTE — Telephone Encounter (Signed)
Called and spoke with the pt and informed her of recent x-ray results and note.  Pt verbalized understanding and agreed to referral and medication.  Pt agreed to have something sent in for pain.  Pt stated that she would like to see Ortho in [facility] and she will call back with the number to the office.//AB/CMA

## 2015-10-17 NOTE — Telephone Encounter (Signed)
Caller name: Self  Can be reached: [phone]   Reason for call: Patient called to say that she would like the referral to be to [facility]

## 2015-10-17 NOTE — Telephone Encounter (Signed)
Rx Tramadol printed. Please phone in to pharmacy.

## 2015-10-17 NOTE — Progress Notes (Signed)
Pre visit review using our clinic review tool, if applicable. No additional management support is needed unless otherwise documented below in the visit note. 

## 2015-10-17 NOTE — Progress Notes (Signed)
Erroneous encounter, please disregard

## 2015-10-17 NOTE — Telephone Encounter (Signed)
-----   Message from Brunetta Jeans, PA-C sent at 10/17/2015  2:26 PM EDT ----- Foot x-ray negative for fracture or abnormality.  X-ray of R knee without fracture , joint effusion. Does show a 1 cm area over the tibia that could be an osseous growth within the joint. Recommend Ortho giving persistent symptoms. Ok to place referral if patient willing. Continue knee brace, rest, elevation. Can consider Rx pain medication if she would like.

## 2015-10-18 NOTE — Telephone Encounter (Signed)
We do not have [facility]. Referral placed to [facility] who we typically use.

## 2015-10-18 NOTE — Telephone Encounter (Signed)
Rx faxed to the pharmacy.  Confirmation received.//AB/CMA 

## 2015-11-29 ENCOUNTER — Ambulatory Visit: Payer: Self-pay | Admitting: Pharmacist Clinician (PhC)/ Clinical Pharmacy Specialist

## 2016-02-14 ENCOUNTER — Ambulatory Visit (INDEPENDENT_AMBULATORY_CARE_PROVIDER_SITE_OTHER): Payer: Self-pay | Admitting: Physician Assistant

## 2016-02-14 ENCOUNTER — Encounter: Payer: Self-pay | Admitting: Physician Assistant

## 2016-02-14 VITALS — BP 130/82 | HR 93 | Temp 98.3°F | Resp 16 | Ht 65.0 in | Wt 212.4 lb

## 2016-02-14 DIAGNOSIS — S61219A Laceration without foreign body of unspecified finger without damage to nail, initial encounter: Secondary | ICD-10-CM

## 2016-02-14 DIAGNOSIS — E1165 Type 2 diabetes mellitus with hyperglycemia: Secondary | ICD-10-CM

## 2016-02-14 DIAGNOSIS — M7711 Lateral epicondylitis, right elbow: Secondary | ICD-10-CM

## 2016-02-14 DIAGNOSIS — E781 Pure hyperglyceridemia: Secondary | ICD-10-CM

## 2016-02-14 DIAGNOSIS — E118 Type 2 diabetes mellitus with unspecified complications: Secondary | ICD-10-CM

## 2016-02-14 LAB — LIPID PANEL
CHOL/HDL RATIO: 18
Cholesterol: 416 mg/dL — ABNORMAL HIGH (ref 0–200)
HDL: 23.3 mg/dL — AB (ref >39.00)

## 2016-02-14 LAB — COMPREHENSIVE METABOLIC PANEL
ALT: 16 U/L (ref 0–35)
AST: 20 U/L (ref 0–37)
Albumin: 3.8 g/dL (ref 3.5–5.2)
Alkaline Phosphatase: 97 U/L (ref 39–117)
BUN: 9 mg/dL (ref 6–23)
CHLORIDE: 98 meq/L (ref 96–112)
CO2: 23 mEq/L (ref 19–32)
Calcium: 9.1 mg/dL (ref 8.4–10.5)
Creatinine, Ser: 0.6 mg/dL (ref 0.40–1.20)
GFR: 114.19 mL/min (ref >60.00)
GLUCOSE: 194 mg/dL — AB (ref 70–99)
POTASSIUM: 3.8 meq/L (ref 3.5–5.1)
SODIUM: 130 meq/L — AB (ref 135–145)
Total Bilirubin: 0.4 mg/dL (ref 0.2–1.2)
Total Protein: 7 g/dL (ref 6.0–8.3)

## 2016-02-14 LAB — HEMOGLOBIN A1C: HEMOGLOBIN A1C: 9.8 % — AB (ref 4.6–6.5)

## 2016-02-14 LAB — LDL CHOLESTEROL, DIRECT: Direct LDL: 55 mg/dL

## 2016-02-14 MED ORDER — MELOXICAM 15 MG PO TABS: 15.0000 mg | ORAL_TABLET | Freq: Every day | ORAL | 0 refills | Status: DC

## 2016-02-14 NOTE — Patient Instructions (Addendum)
Please go to the lab for blood work. I will call you with your results.  Please continue medications as directed with the following exceptions:    -- Regarding your Levemir:          - Increase Levemir by 2 units.          - Check fasting sugars every day.          - If sugars are > 130 on average, increase insulin by 2 more units.          - Continue this making a change every 3 days until fasting sugars are averaging 80-120 consistently.          - If you notice fasting sugars 70 or below, decrease insulin by 3 units and call me.  If you reach 30 units of insulin daily and sugars are still uncontrolled, call me.   For the finger -- keep clean and dry. Wash with soapy water. Keep covered. Follow-up if not healing over the next couple of weeks. If you notice any increased pain, redness or drainage, please come see me immediately. Your tetanus shot is up-to-date.  For the elbow -- limit heavy lifting. Take Mobic once daily over the next week and half. If symptoms are not resolving, please call me.

## 2016-02-14 NOTE — Progress Notes (Signed)
Patient presents to clinic today c/o cutting R index finger on recessed lighting yesterday at midday- not bleeding for a few minutes until resolving. Endorses tenderness to the area. Has kept clean and dry. Denies pus, redness, fever or chills.   Patient also following up regarding DM II, uncontrolled and hypertriglyceridemia.    Regarding triglycerides, patient was sent to the [facility] for further assessment. Was instructed to continue Lipitor and Fenofibrate. Was also started on Fish oils 4 g per day. Endorses taking all medications as directed. Is working on diet and exercise. Is fasting for repeat labs today.  In regards to DM II, uncontrolled. Is on Metformin 500 mg BID. Levemir is currently at 16 units. Endorses fasting sugars remain in the mid 200s. At last visit patient was instructed to increase insulin by 2 units ever 3 days until fasting sugars 90-130. Has not been doing this as she states she forgot her instructions. Is due for eye exam, followed by [facility]. Foot examination is up-to-date.   Patient also c/o pain in lateral R elbow over the past few weeks. Denies trauma or injury. Denies swelling, skin changes, numbness or tingling. Has not taken anything for symptoms.  Past Medical History:  Diagnosis Date  . Chronic ear infection   . Diabetes mellitus type II, controlled ([place])   . Hyperlipidemia   . Pancreatitis, acute     Current Outpatient Prescriptions on File Prior to Visit  Medication Sig Dispense Refill  . atorvastatin (LIPITOR) 80 MG tablet Take 1 tablet (80 mg total) by mouth daily. 30 tablet 1  . fenofibrate 160 MG tablet TAKE ONE TABLET BY MOUTH ONCE DAILY 30 tablet 5  . fluticasone (FLONASE) 50 MCG/ACT nasal spray Place 2 sprays into both nostrils daily. (Patient taking differently: Place 2 sprays into both nostrils daily as needed. ) 16 g 6  . glucose blood test strip Use as instructed 100 each 12  . Insulin Pen Needle (1ST CHOICE PEN NEEDLES)  31G X 6 MM MISC For insulin injections as directed 100 each 0  . Lancets (FREESTYLE) lancets Use as instructed 100 each 12  . LEVEMIR FLEXTOUCH 100 UNIT/ML Pen INJECT 12 UNITS UNDER THE SKIN DAILY AT 10PM (Patient taking differently: INJECT 12 UNITS UNDER THE SKIN DAILY AT 10PM-ADD 2 UNITS EVERY 3-4 DAYS UNTIL FASTING CBG IS 90-110) 15 mL 3  . lisinopril (PRINIVIL,ZESTRIL) 10 MG tablet Take 1 tablet (10 mg total) by mouth daily. 30 tablet 2  . metFORMIN (GLUCOPHAGE) 500 MG tablet TAKE ONE TABLET BY MOUTH TWICE DAILY WITH MEALS 60 tablet 5  . Omega-3 Fatty Acids (FISH OIL) 1000 MG CAPS Take 4,000 mg by mouth.     . traMADol (ULTRAM) 50 MG tablet Take 1 tablet (50 mg total) by mouth every 8 (eight) hours as needed. 90 tablet 0   No current facility-administered medications on file prior to visit.     Allergies  Allergen Reactions  . Novocain [Procaine] Nausea And Vomiting    Family History  Problem Relation Age of Onset  . Heart attack Father 2    Deceased  . Diabetes Father   . Diabetes Paternal Uncle   . Diabetes Mother   . Hypertension Mother     Living  . Alzheimer's disease Maternal Grandfather   . Healthy Brother   . Healthy Daughter     Social History   Social History  . Marital status: Married    Spouse name: N/A  . Number  of children: N/A  . Years of education: N/A   Social History Main Topics  . Smoking status: Former Smoker    Quit date: 05/22/2015  . Smokeless tobacco: None     Comment: Pt has not smoked in 1.5 Wks  . Alcohol use No  . Drug use: No  . Sexual activity: Not Asked   Other Topics Concern  . None   Social History Narrative  . None   Review of Systems - See HPI.  All other ROS are negative.  BP 130/82 (BP Location: Right Arm, Patient Position: Sitting, Cuff Size: Normal)   Pulse 93   Temp 98.3 F (36.8 C) (Oral)   Resp 16   Ht 5' 5"  (1.651 m)   Wt 212 lb 6 oz (96.3 kg)   LMP 01/29/2016   SpO2 97%   BMI 35.34 kg/m   Physical Exam    Constitutional: She is oriented to person, place, and time and well-developed, well-nourished, and in no distress.  HENT:  Head: Normocephalic and atraumatic.  Eyes: Conjunctivae are normal.  Neck: Neck supple.  Cardiovascular: Normal rate, regular rhythm, normal heart sounds and intact distal pulses.   Pulmonary/Chest: Effort normal and breath sounds normal. No respiratory distress. She has no wheezes. She has no rales. She exhibits no tenderness.  Musculoskeletal:       Right elbow: She exhibits normal range of motion and no swelling. Tenderness found. Lateral epicondyle tenderness noted. No radial head and no olecranon process tenderness noted.  Pain with pronation and supination of R forearm. Strength 5/5. Extremity is neurovascularly intact.   Neurological: She is alert and oriented to person, place, and time.  Skin: Skin is warm and dry. No rash noted.     Psychiatric: Affect normal.  Vitals reviewed.   No results found for this or any previous visit (from the past 2160 hour(s)).  Assessment/Plan: 1. Uncontrolled type 2 diabetes mellitus with complication, unspecified long term insulin use status ([place]) Will repeat CMP and A1C today. Foot exam up-to-date. Pneumococcal vaccine due. Patient declines today for financial reasons (self-pay). Discussed updating in the near future due to increased risk for pneumococcal infection. She is to schedule eye examination with [facility]. Requested she have specialist fax over office visit notes once completed. Discussed insulin titration today (See AVS). She is to continue titration to goal fasting sugars of 80-130. Continue Metformin as directed.  - Comp Met (CMET) - Hemoglobin A1c - Lipid Profile  2. Hypertriglyceridemia Will not be controlled consistently until DM under control. Will continue Lipitor, Fish Oils and Fenofibrate. Will repeat fasting lipids and CMP today.   - Comp Met (CMET) - Hemoglobin A1c - Lipid Profile  3. Finger  laceration, initial encounter Superficial. No sign of infection. Outside the window where the wound can be sutured without significant risk of infection. Will have to heal inside out. Wound care and bandaging discussed. Signs and symptoms of infection reviewed. She is to return to office immediately if these occur.   4. Lateral epicondylitis (tennis elbow), right Mild. Discussed immobilization of elbow at night. Rx Mobic. Supportive measures reviewed. If not improving will need to consider Ortho versus SM assessment.   [name], PA-C

## 2016-02-15 ENCOUNTER — Ambulatory Visit: Payer: Self-pay | Admitting: Physician Assistant

## 2016-03-29 IMAGING — US US ABDOMEN COMPLETE
1 series · 14 of 25 positions shown · non-contrast
Comparison: None.

CLINICAL DATA: Epigastric pain and nausea and vomiting for 1 day,
chills

EXAM:
ULTRASOUND ABDOMEN COMPLETE

[Series 1: us abdomen complete · 0.39mm/px · 14 of 95 slices shown]
[im 1/95]
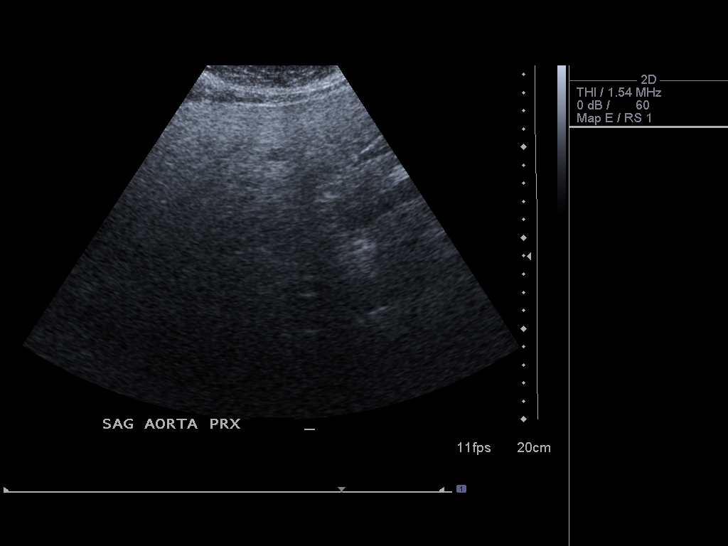
[im 8/95]
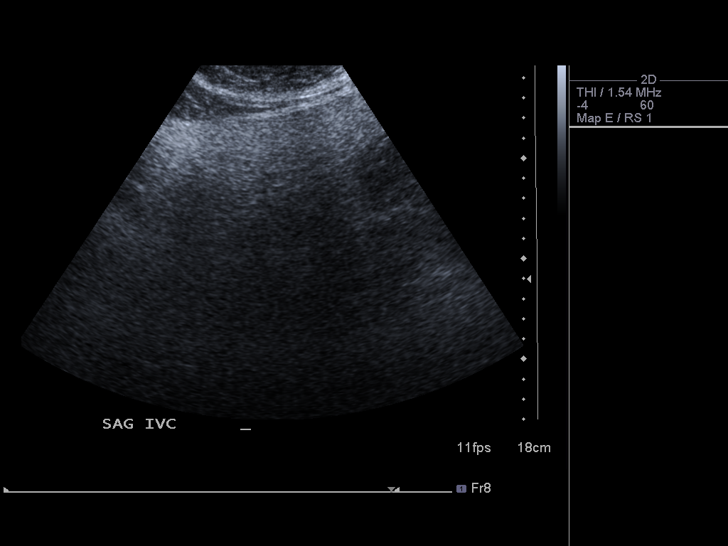
[im 16/95]
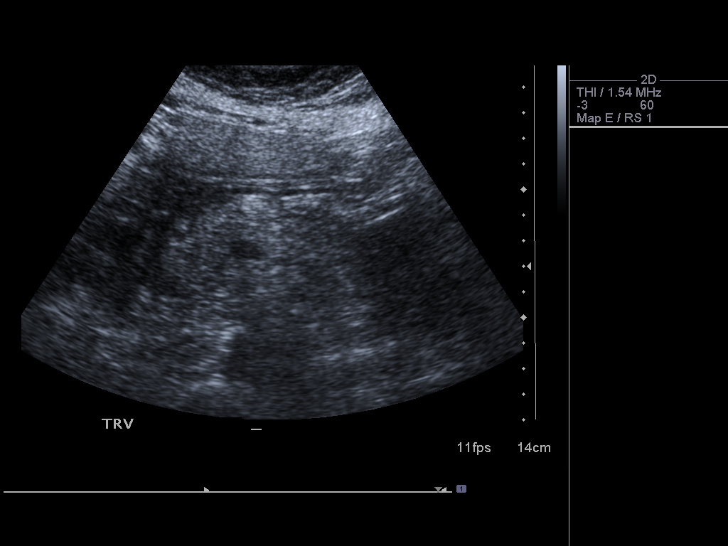
[im 24/95]
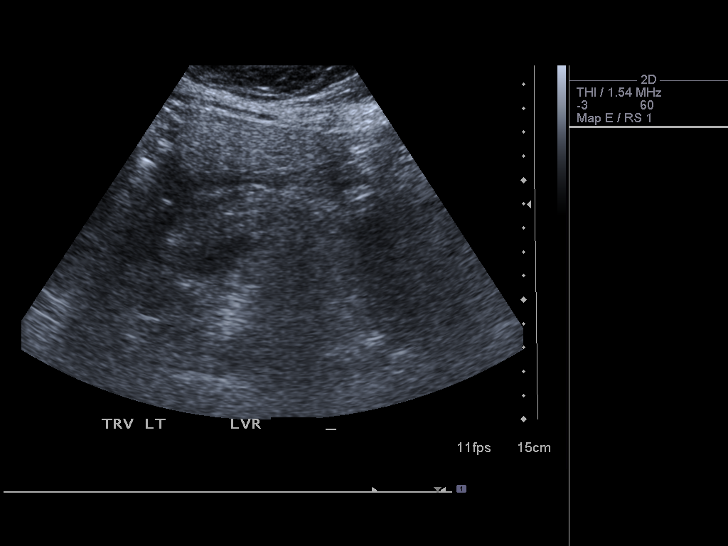
[im 32/95]
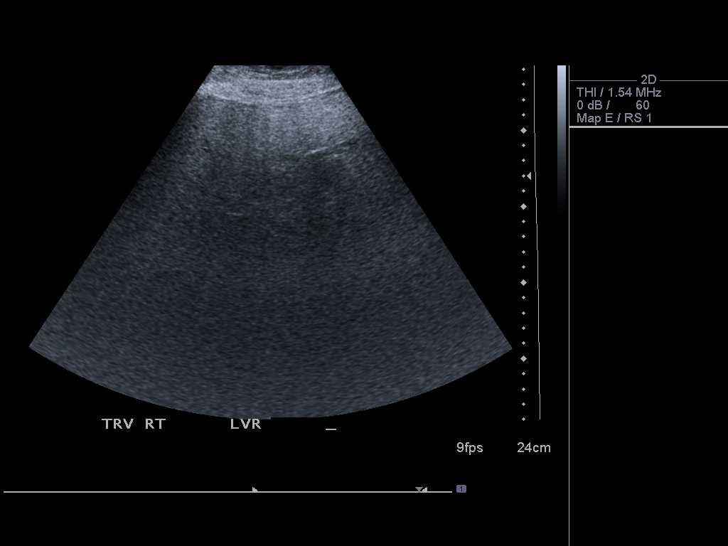
[im 36/95]
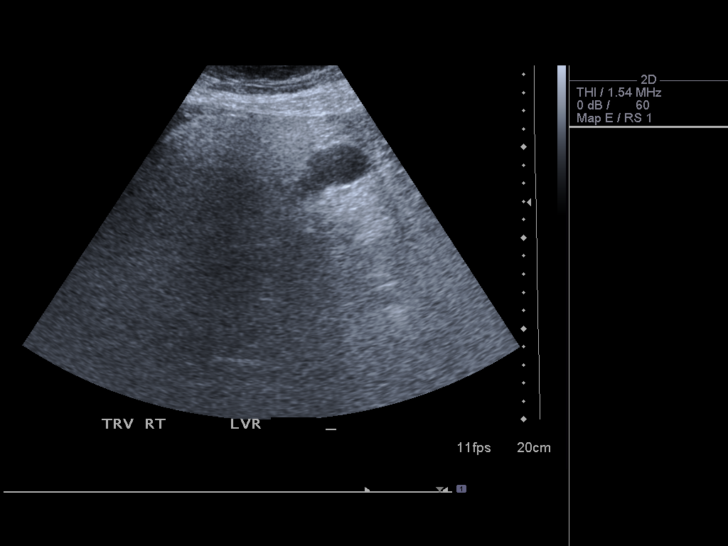
[im 44/95]
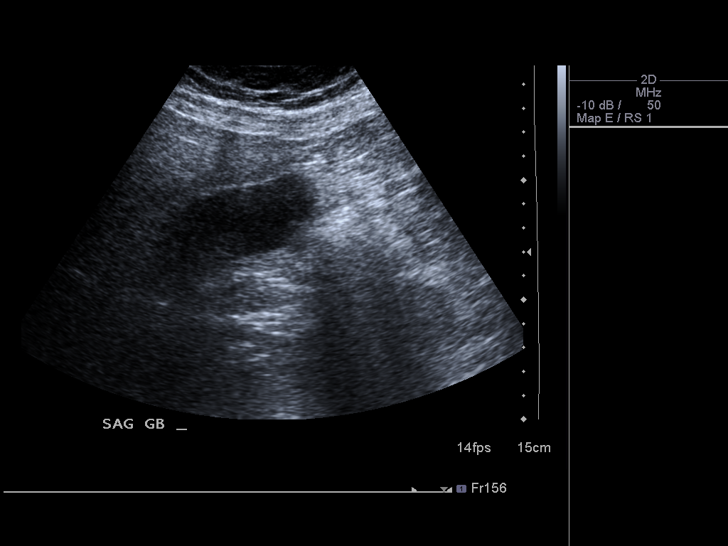
[im 51/95]
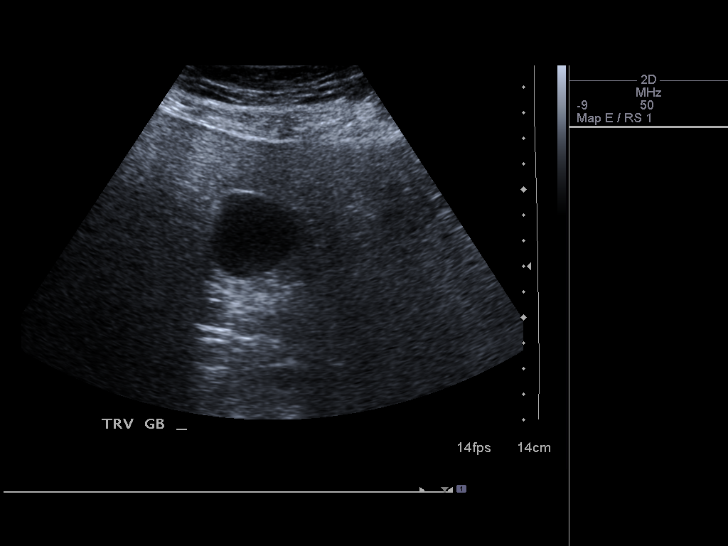
[im 59/95]
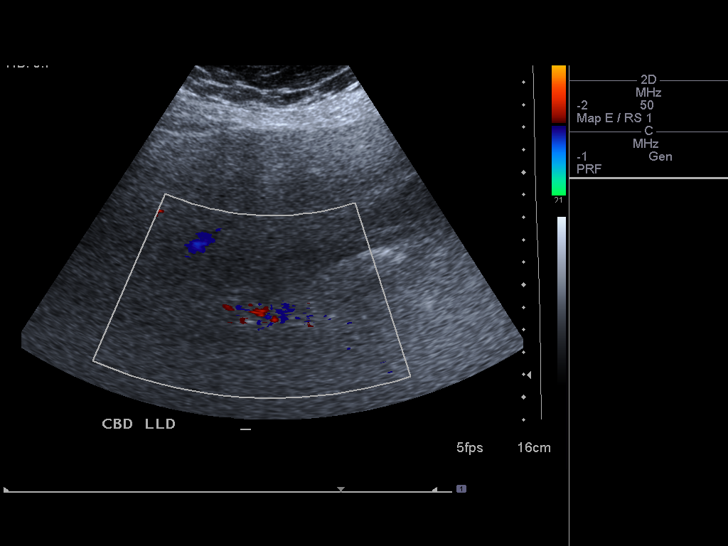
[im 63/95]
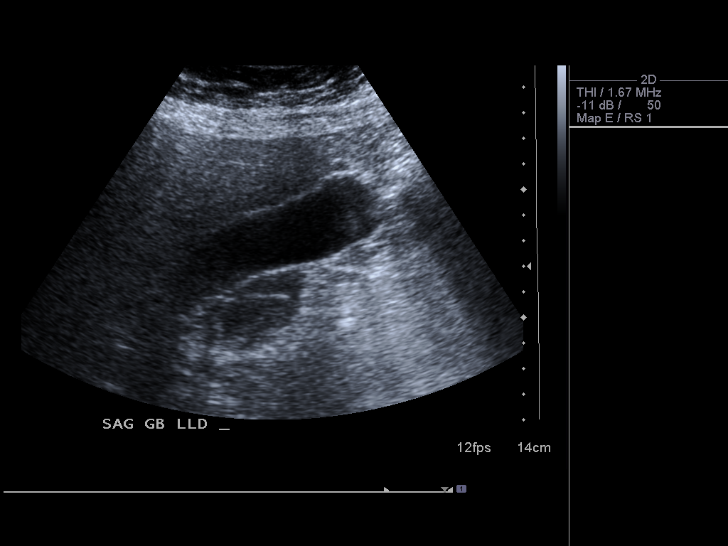
[im 71/95]
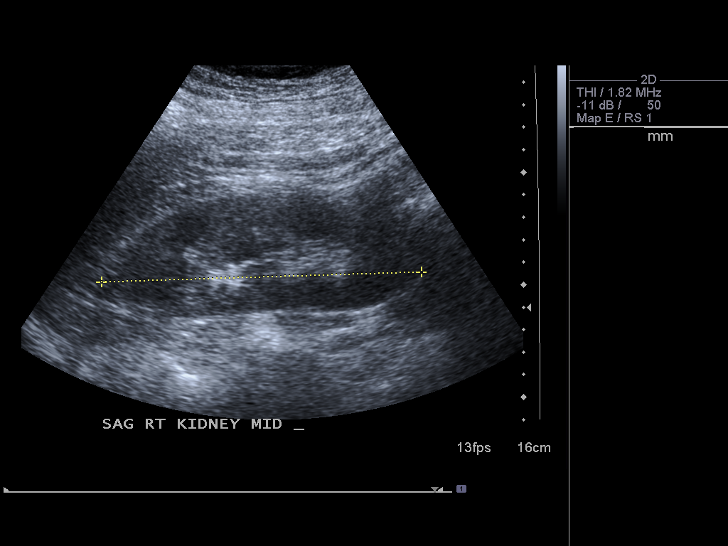
[im 79/95]
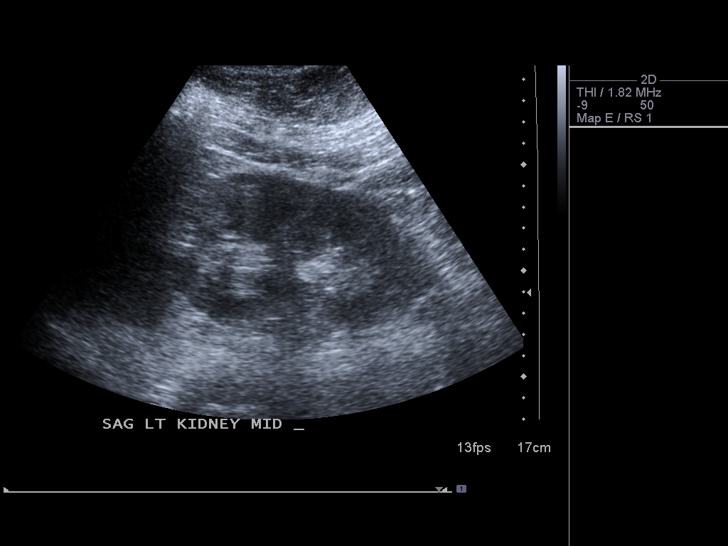
[im 87/95]
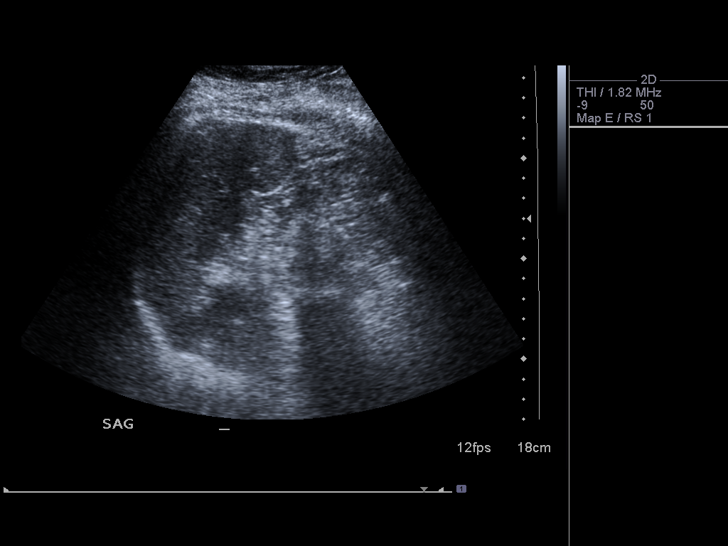
[im 95/95]
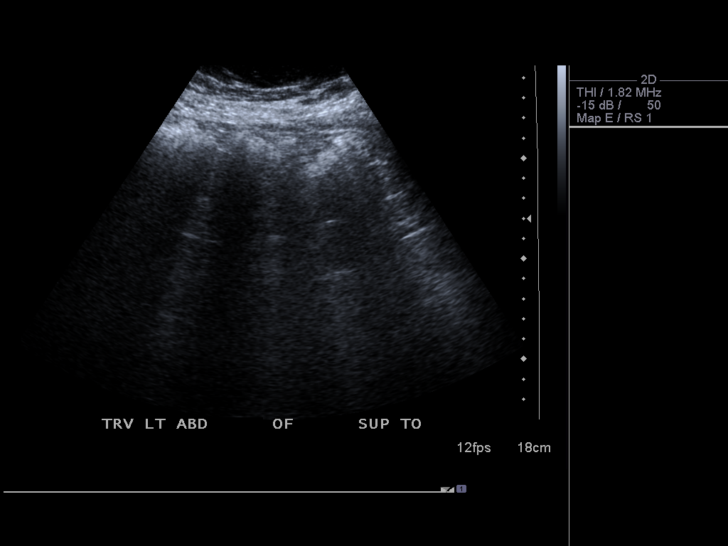

[14 of 25 positions shown; findings below may reference images not displayed]

FINDINGS: Gallbladder:

There may be a very small volume of sludge although this is
equivocal. No gallstones or gallbladder wall thickening. No Murphy's
sign.

Common bile duct:

Diameter: Approximately 4 mm although evaluation is markedly
limited.

Liver:

Liver is not well evaluated. The liver appears to be sonographically
dense. Cannot exclude hepatic abnormalities.

IVC:

Not identified

Pancreas:

No gross abnormalities but seen in very limited detail

Spleen:

Size and appearance within normal limits.

Right Kidney:

Length: 14 cm. Echogenicity within normal limits. No mass or
hydronephrosis visualized.

Left Kidney:

Length: 13 cm. Echogenicity within normal limits. No mass or
hydronephrosis visualized.

Abdominal aorta:

Cannot be evaluated due to body habitus and overlying bowel gas

Other findings:

None.
IMPRESSION: No acute findings, but sensitivity markedly reduced by the
limitations described above.

## 2016-04-28 ENCOUNTER — Other Ambulatory Visit: Payer: Self-pay | Admitting: Physician Assistant

## 2016-05-24 ENCOUNTER — Encounter: Payer: Self-pay | Admitting: Physician Assistant

## 2016-06-14 LAB — HM DIABETES EYE EXAM

## 2016-08-16 ENCOUNTER — Encounter: Payer: Self-pay | Admitting: Physician Assistant

## 2016-08-17 ENCOUNTER — Ambulatory Visit (INDEPENDENT_AMBULATORY_CARE_PROVIDER_SITE_OTHER): Payer: Self-pay | Admitting: Physician Assistant

## 2016-08-17 ENCOUNTER — Encounter: Payer: Self-pay | Admitting: Physician Assistant

## 2016-08-17 VITALS — BP 132/80 | HR 86 | Temp 98.4°F | Resp 14 | Ht 65.0 in | Wt 205.0 lb

## 2016-08-17 DIAGNOSIS — E1169 Type 2 diabetes mellitus with other specified complication: Secondary | ICD-10-CM

## 2016-08-17 DIAGNOSIS — B351 Tinea unguium: Secondary | ICD-10-CM

## 2016-08-17 DIAGNOSIS — M533 Sacrococcygeal disorders, not elsewhere classified: Secondary | ICD-10-CM

## 2016-08-17 DIAGNOSIS — E118 Type 2 diabetes mellitus with unspecified complications: Secondary | ICD-10-CM

## 2016-08-17 DIAGNOSIS — E781 Pure hyperglyceridemia: Secondary | ICD-10-CM

## 2016-08-17 DIAGNOSIS — Z23 Encounter for immunization: Secondary | ICD-10-CM

## 2016-08-17 DIAGNOSIS — E1165 Type 2 diabetes mellitus with hyperglycemia: Secondary | ICD-10-CM

## 2016-08-17 DIAGNOSIS — E785 Hyperlipidemia, unspecified: Secondary | ICD-10-CM

## 2016-08-17 DIAGNOSIS — Z794 Long term (current) use of insulin: Secondary | ICD-10-CM

## 2016-08-17 DIAGNOSIS — IMO0002 Reserved for concepts with insufficient information to code with codable children: Secondary | ICD-10-CM

## 2016-08-17 LAB — COMPREHENSIVE METABOLIC PANEL
ALT: 9 U/L (ref 0–35)
AST: 9 U/L (ref 0–37)
Albumin: 4 g/dL (ref 3.5–5.2)
Alkaline Phosphatase: 85 U/L (ref 39–117)
BILIRUBIN TOTAL: 0.3 mg/dL (ref 0.2–1.2)
BUN: 11 mg/dL (ref 6–23)
CO2: 27 meq/L (ref 19–32)
Calcium: 9.1 mg/dL (ref 8.4–10.5)
Chloride: 101 mEq/L (ref 96–112)
Creatinine, Ser: 0.65 mg/dL (ref 0.40–1.20)
GFR: 103.89 mL/min (ref >60.00)
GLUCOSE: 204 mg/dL — AB (ref 70–99)
Potassium: 4.3 mEq/L (ref 3.5–5.1)
SODIUM: 133 meq/L — AB (ref 135–145)
Total Protein: 7.2 g/dL (ref 6.0–8.3)

## 2016-08-17 LAB — LIPID PANEL
Cholesterol: 355 mg/dL — ABNORMAL HIGH (ref 0–200)
HDL: 23 mg/dL — AB (ref >39.00)
Total CHOL/HDL Ratio: 15
Triglycerides: 1721 mg/dL — ABNORMAL HIGH (ref 0.0–149.0)

## 2016-08-17 LAB — HEMOGLOBIN A1C: HEMOGLOBIN A1C: 9.4 % — AB (ref 4.6–6.5)

## 2016-08-17 LAB — SEDIMENTATION RATE: Sed Rate: 40 mm/hr — ABNORMAL HIGH (ref 0–20)

## 2016-08-17 LAB — LDL CHOLESTEROL, DIRECT: LDL DIRECT: 50 mg/dL

## 2016-08-17 MED ORDER — MELOXICAM 15 MG PO TABS: 15.0000 mg | ORAL_TABLET | Freq: Every day | ORAL | 0 refills | Status: DC

## 2016-08-17 NOTE — Progress Notes (Signed)
Pre visit review using our clinic review tool, if applicable. No additional management support is needed unless otherwise documented below in the visit note. 

## 2016-08-17 NOTE — Progress Notes (Signed)
Patient presents to clinic today c/o 2 weeks of bilateral lumbosacral back pain, mostly L-sided. Notes aching pain. Is not radiating into lower extremity. Denies change to bowel/bladder habits. Has tried Ibuprofen, Tylenol Hot baths and Bio Freeze with little change in symptoms. Denies known trauma or injury but does do a lot of heavy lifting at work.  Patient is also overdue for follow-up regarding Diabetes Mellitus II,uncontrolled and Hyperlipidemia and Hypertriglyceridemia.    In regards to cholesterol and triglycerides, patient is followed by the Lipid Clinic. Is currently on regimen of Lipitor 40 mg, Fenofibrate 160 mg and Fish oil, 4 capsules daily. Endorses she has not been taking as directed. Has only just restarted medications. Is working hard on her diet. Has increased lean protein and vegetables. Limiting carbohydrates. No alcohol intake. Is staying active at work but no regular exercise at present 2/2 current back pain.   Past Medical History:  Diagnosis Date  . Chronic ear infection   . Diabetes mellitus type II, controlled ([place])   . Hyperlipidemia   . Pancreatitis, acute     Current Outpatient Prescriptions on File Prior to Visit  Medication Sig Dispense Refill  . atorvastatin (LIPITOR) 40 MG tablet TAKE ONE TABLET BY MOUTH ONCE DAILY 30 tablet 0  . fenofibrate 160 MG tablet TAKE ONE TABLET BY MOUTH ONCE DAILY 30 tablet 5  . glucose blood test strip Use as instructed 100 each 12  . Insulin Pen Needle (1ST CHOICE PEN NEEDLES) 31G X 6 MM MISC For insulin injections as directed 100 each 0  . Lancets (FREESTYLE) lancets Use as instructed 100 each 12  . LEVEMIR FLEXTOUCH 100 UNIT/ML Pen INJECT 12 UNITS UNDER THE SKIN DAILY AT 10PM (Patient taking differently: INJECT 18 UNITS UNDER THE SKIN DAILY AT 10PM-ADD 2 UNITS EVERY 3-4 DAYS UNTIL FASTING CBG IS 90-110) 15 mL 3  . lisinopril (PRINIVIL,ZESTRIL) 10 MG tablet Take 1 tablet (10 mg total) by mouth daily. 30 tablet 2  . metFORMIN  (GLUCOPHAGE) 500 MG tablet TAKE ONE TABLET BY MOUTH TWICE DAILY WITH MEALS 60 tablet 0  . Omega-3 Fatty Acids (FISH OIL) 1000 MG CAPS Take 4,000 mg by mouth.     . fluticasone (FLONASE) 50 MCG/ACT nasal spray Place 2 sprays into both nostrils daily. (Patient not taking: Reported on 08/17/2016) 16 g 6   No current facility-administered medications on file prior to visit.     Allergies  Allergen Reactions  . Novocain [Procaine] Nausea And Vomiting    Family History  Problem Relation Age of Onset  . Heart attack Father 71    Deceased  . Diabetes Father   . Diabetes Mother   . Hypertension Mother     Living  . Alzheimer's disease Maternal Grandfather   . Healthy Brother   . Healthy Daughter   . Diabetes Paternal Uncle     Social History   Social History  . Marital status: Married    Spouse name: N/A  . Number of children: N/A  . Years of education: N/A   Social History Main Topics  . Smoking status: Former Smoker    Quit date: 05/22/2015  . Smokeless tobacco: Never Used  . Alcohol use No  . Drug use: No  . Sexual activity: Not Asked   Other Topics Concern  . None   Social History Narrative  . None   Review of Systems - See HPI.  All other ROS are negative.  BP 132/80   Pulse 86  Temp 98.4 F (36.9 C) (Oral)   Resp 14   Ht 5' 5" (1.651 m)   Wt 205 lb (93 kg)   SpO2 98%   BMI 34.11 kg/m   Physical Exam  Constitutional: She is oriented to person, place, and time and well-developed, well-nourished, and in no distress.  HENT:  Head: Normocephalic and atraumatic.  Eyes: Conjunctivae are normal.  Cardiovascular: Normal rate, regular rhythm, normal heart sounds and intact distal pulses.   Pulmonary/Chest: Effort normal and breath sounds normal. No respiratory distress. She has no wheezes. She has no rales. She exhibits no tenderness.  Musculoskeletal:       Right hip: Normal.       Left hip: Normal.       Lumbar back: She exhibits pain. She exhibits no  tenderness and no bony tenderness.  Neurological: She is alert and oriented to person, place, and time. No cranial nerve deficit.  Skin: Skin is warm and dry. No rash noted.  Psychiatric: Affect normal.  Vitals reviewed.  Recent Results (from the past 2160 hour(s))  HM DIABETES EYE EXAM     Status: None   Collection Time: 06/14/16 12:00 AM  Result Value Ref Range   HM Diabetic Eye Exam No Retinopathy No Retinopathy  Hemoglobin A1c     Status: Abnormal   Collection Time: 08/17/16  8:52 AM  Result Value Ref Range   Hgb A1c MFr Bld 9.4 (H) 4.6 - 6.5 %    Comment: Glycemic Control Guidelines for People with Diabetes:Non Diabetic:  <6%Goal of Therapy: <7%Additional Action Suggested:  >8%   Comp Met (CMET)     Status: Abnormal   Collection Time: 08/17/16  8:52 AM  Result Value Ref Range   Sodium 133 (L) 135 - 145 mEq/L   Potassium 4.3 3.5 - 5.1 mEq/L   Chloride 101 96 - 112 mEq/L   CO2 27 19 - 32 mEq/L   Glucose, Bld 204 (H) 70 - 99 mg/dL   BUN 11 6 - 23 mg/dL   Creatinine, Ser 0.65 0.40 - 1.20 mg/dL   Total Bilirubin 0.3 0.2 - 1.2 mg/dL   Alkaline Phosphatase 85 39 - 117 U/L   AST 9 0 - 37 U/L   ALT 9 0 - 35 U/L   Total Protein 7.2 6.0 - 8.3 g/dL   Albumin 4.0 3.5 - 5.2 g/dL   Calcium 9.1 8.4 - 10.5 mg/dL   GFR 103.89 >60.00 mL/min  Lipid panel     Status: Abnormal   Collection Time: 08/17/16  8:52 AM  Result Value Ref Range   Cholesterol 355 (H) 0 - 200 mg/dL    Comment: ATP III Classification       Desirable:  < 200 mg/dL               Borderline High:  200 - 239 mg/dL          High:  > = 240 mg/dL   Triglycerides (H) 0.0 - 149.0 mg/dL    1721.0 Triglyceride is over 400; calculations on Lipids are invalid.    Comment: Normal:  <150 mg/dLBorderline High:  150 - 199 mg/dL   HDL 23.00 (L) >39.00 mg/dL   Total CHOL/HDL Ratio 15     Comment:                Men          Women1/2 Average Risk     3.4  3.3Average Risk          5.0          4.42X Average Risk          9.6           7.13X Average Risk          15.0          11.0                      Sed Rate (ESR)     Status: Abnormal   Collection Time: 08/17/16  8:52 AM  Result Value Ref Range   Sed Rate 40 (H) 0 - 20 mm/hr  LDL cholesterol, direct     Status: None   Collection Time: 08/17/16  8:52 AM  Result Value Ref Range   Direct LDL 50.0 mg/dL    Comment: Optimal:  <100 mg/dLNear or Above Optimal:  100-129 mg/dLBorderline High:  130-159 mg/dLHigh:  160-189 mg/dLVery High:  >190 mg/dL   Diabetic Foot Form - Detailed   Diabetic Foot Exam - detailed Diabetic Foot exam was performed with the following findings:  Yes 08/17/2016  9:00 AM  Visual Foot Exam completed.:  Yes  Is there a history of foot ulcer?:  No Can the patient see the bottom of their feet?:  Yes Are the shoes appropriate in style and fit?:  Yes Is there swelling or and abnormal foot shape?:  No Are the toenails long?:  No Are the toenails thick?:  No Do you have pain in calf while walking?:  No Is there a claw toe deformity?:  No Is there elevated skin temparature?:  No Is there limited skin dorsiflexion?:  No Is there foot or ankle muscle weakness?:  No Are the toenails ingrown?:  No Normal Range of Motion:  Yes Pulse Foot Exam completed.:  Yes  Right posterior Tibialias:  Present Left posterior Tibialias:  Present  Right Dorsalis Pedis:  Present Left Dorsalis Pedis:  Present  Sensory Foot Exam Completed.:  Yes Swelling:  No Semmes-Weinstein Monofilament Test R Foot Test Control:  Neg L Foot Test Control:  Neg  R Site 1-Great Toe:  Neg L Site 1-Great Toe:  Neg  R Site 4:  Neg L Site 4:  Neg  R Site 5:  Neg L Site 5:  Neg       Assessment/Plan: Hyperlipidemia associated with type 2 diabetes mellitus ([place]) With significant elevation of Triglycerides.  Is followed by the lipid clinic.  Has not been taking medications as directed. Repeat fasting labs today to assess.  Dietary and exercise recommendations reviewed. FU based on  results. FU with Lipid Clinic as scheduled.  Onychomycosis Referral to podiatry placed for diabetic nail care and treatment.   Sacroiliac pain Aching pain worse in L sacroiliac region. Will obtain ESR today.  Supportive measures discussed. Rx Mobic. FU based on results and response to conservative measures.  Need for 23-polyvalent pneumococcal polysaccharide vaccine Pneumovax given today by CMA.  Hypertriglyceridemia Significant. Related to her diabetes. Is not taking medications as directed. + hx of pancreatitis. She is to take her statin, fibrate and fish oils as directed. Will obtain repeat labs today. Will continue working on glycemic control. FU with Lipid Clinic as scheduled.  Type II diabetes mellitus, uncontrolled ([place]) Will obtain repeat labs today.  Patient to take medications as directed. If renal function stable, will continue titration of metformin.  Discussed importance of consistent follow-up. FU 1 month.  [name], PA-C

## 2016-08-17 NOTE — Patient Instructions (Addendum)
Please go to the lab for blood work. Please continue diabetic medications as directed,being consistent with taking them. If your renal function is stable, I will be increasing your Metformin.   For the pain, I am checking some additional labs.  Please take the Meloxicam as directed to help with pain and inflammation. I would recommend a chiropractor.  Continue supportive measures.  Avoid heavy lifting when possible.  Follow-up with me in 2 weeks.    Diabetes Mellitus and Exercise Exercising regularly is important for your overall health, especially when you have diabetes (diabetes mellitus). Exercising is not only about losing weight. It has many health benefits, such as increasing muscle strength and bone density and reducing body fat and stress. This leads to improved fitness, flexibility, and endurance, all of which result in better overall health. Exercise has additional benefits for people with diabetes, including:  Reducing appetite.  Helping to lower and control blood glucose.  Lowering blood pressure.  Helping to control amounts of fatty substances (lipids) in the blood, such as cholesterol and triglycerides.  Helping the body to respond better to insulin (improving insulin sensitivity).  Reducing how much insulin the body needs.  Decreasing the risk for heart disease by:  Lowering cholesterol and triglyceride levels.  Increasing the levels of good cholesterol.  Lowering blood glucose levels. What is my activity plan? Your health care provider or certified diabetes educator can help you make a plan for the type and frequency of exercise (activity plan) that works for you. Make sure that you:  Do at least 150 minutes of moderate-intensity or vigorous-intensity exercise each week. This could be brisk walking, biking, or water aerobics.  Do stretching and strength exercises, such as yoga or weightlifting, at least 2 times a week.  Spread out your activity over at least  3 days of the week.  Get some form of physical activity every day.  Do not go more than 2 days in a row without some kind of physical activity.  Avoid being inactive for more than 90 minutes at a time. Take frequent breaks to walk or stretch.  Choose a type of exercise or activity that you enjoy, and set realistic goals.  Start slowly, and gradually increase the intensity of your exercise over time. What do I need to know about managing my diabetes?  Check your blood glucose before and after exercising.  If your blood glucose is higher than 240 mg/dL (13.3 mmol/L) before you exercise, check your urine for ketones. If you have ketones in your urine, do not exercise until your blood glucose returns to normal.  Know the symptoms of low blood glucose (hypoglycemia) and how to treat it. Your risk for hypoglycemia increases during and after exercise. Common symptoms of hypoglycemia can include:  Hunger.  Anxiety.  Sweating and feeling clammy.  Confusion.  Dizziness or feeling light-headed.  Increased heart rate or palpitations.  Blurry vision.  Tingling or numbness around the mouth, lips, or tongue.  Tremors or shakes.  Irritability.  Keep a rapid-acting carbohydrate snack available before, during, and after exercise to help prevent or treat hypoglycemia.  Avoid injecting insulin into areas of the body that are going to be exercised. For example, avoid injecting insulin into:  The arms, when playing tennis.  The legs, when jogging.  Keep records of your exercise habits. Doing this can help you and your health care provider adjust your diabetes management plan as needed. Write down:  Food that you eat before and after you  exercise.  Blood glucose levels before and after you exercise.  The type and amount of exercise you have done.  When your insulin is expected to peak, if you use insulin. Avoid exercising at times when your insulin is peaking.  When you start a new  exercise or activity, work with your health care provider to make sure the activity is safe for you, and to adjust your insulin, medicines, or food intake as needed.  Drink plenty of water while you exercise to prevent dehydration or heat stroke. Drink enough fluid to keep your urine clear or pale yellow. This information is not intended to replace advice given to you by your health care provider. Make sure you discuss any questions you have with your health care provider. Document Released: 09/22/2003 Document Revised: 01/20/2016 Document Reviewed: 12/12/2015 Elsevier Interactive Patient Education  2017 [employer].

## 2016-08-19 DIAGNOSIS — M533 Sacrococcygeal disorders, not elsewhere classified: Secondary | ICD-10-CM | POA: Insufficient documentation

## 2016-08-19 DIAGNOSIS — Z23 Encounter for immunization: Secondary | ICD-10-CM | POA: Insufficient documentation

## 2016-08-19 NOTE — Assessment & Plan Note (Signed)
With significant elevation of Triglycerides.  Is followed by the lipid clinic.  Has not been taking medications as directed. Repeat fasting labs today to assess.  Dietary and exercise recommendations reviewed. FU based on results. FU with Lipid Clinic as scheduled.

## 2016-08-19 NOTE — Assessment & Plan Note (Signed)
Referral to podiatry placed for diabetic nail care and treatment.

## 2016-08-19 NOTE — Assessment & Plan Note (Signed)
Will obtain repeat labs today.  Patient to take medications as directed. If renal function stable, will continue titration of metformin.  Discussed importance of consistent follow-up. FU 1 month.

## 2016-08-19 NOTE — Assessment & Plan Note (Signed)
Significant. Related to her diabetes. Is not taking medications as directed. + hx of pancreatitis. She is to take her statin, fibrate and fish oils as directed. Will obtain repeat labs today. Will continue working on glycemic control. FU with Lipid Clinic as scheduled.

## 2016-08-19 NOTE — Assessment & Plan Note (Signed)
Aching pain worse in L sacroiliac region. Will obtain ESR today.  Supportive measures discussed. Rx Mobic. FU based on results and response to conservative measures.

## 2016-08-19 NOTE — Assessment & Plan Note (Signed)
Pneumovax given today by CMA.

## 2016-08-20 LAB — RHEUMATOID FACTOR: Rhuematoid fact SerPl-aCnc: <14 IU/mL (ref <14)

## 2016-08-21 ENCOUNTER — Other Ambulatory Visit: Payer: Self-pay | Admitting: Emergency Medicine

## 2016-08-21 MED ORDER — FENOFIBRATE 160 MG PO TABS: 160.0000 mg | ORAL_TABLET | Freq: Every day | ORAL | 5 refills | Status: DC

## 2016-08-21 MED ORDER — ATORVASTATIN CALCIUM 40 MG PO TABS: 40.0000 mg | ORAL_TABLET | Freq: Every day | ORAL | 5 refills | Status: DC

## 2016-08-21 MED ORDER — METFORMIN HCL 500 MG PO TABS: ORAL_TABLET | ORAL | 1 refills | Status: DC

## 2016-09-18 ENCOUNTER — Ambulatory Visit: Payer: No Typology Code available for payment source | Admitting: Podiatry

## 2016-09-21 ENCOUNTER — Other Ambulatory Visit: Payer: Self-pay | Admitting: Physician Assistant

## 2016-10-30 ENCOUNTER — Encounter (HOSPITAL_BASED_OUTPATIENT_CLINIC_OR_DEPARTMENT_OTHER): Payer: Self-pay | Admitting: Emergency Medicine

## 2016-10-30 ENCOUNTER — Emergency Department (HOSPITAL_BASED_OUTPATIENT_CLINIC_OR_DEPARTMENT_OTHER)
Admission: EM | Admit: 2016-10-30 | Discharge: 2016-10-30 | Disposition: A | Discharge status: Home / Self Care | Payer: Self-pay | Attending: Emergency Medicine | Admitting: Emergency Medicine

## 2016-10-30 DIAGNOSIS — E119 Type 2 diabetes mellitus without complications: Secondary | ICD-10-CM | POA: Insufficient documentation

## 2016-10-30 DIAGNOSIS — Z87891 Personal history of nicotine dependence: Secondary | ICD-10-CM | POA: Insufficient documentation

## 2016-10-30 DIAGNOSIS — Z794 Long term (current) use of insulin: Secondary | ICD-10-CM | POA: Insufficient documentation

## 2016-10-30 DIAGNOSIS — Z5321 Procedure and treatment not carried out due to patient leaving prior to being seen by health care provider: Secondary | ICD-10-CM | POA: Insufficient documentation

## 2016-10-30 DIAGNOSIS — R1013 Epigastric pain: Secondary | ICD-10-CM | POA: Insufficient documentation

## 2016-10-30 LAB — URINALYSIS, ROUTINE W REFLEX MICROSCOPIC
Glucose, UA: 250 mg/dL — AB
KETONES UR: 15 mg/dL — AB
Leukocytes, UA: NEGATIVE
Nitrite: NEGATIVE
Protein, ur: >300 mg/dL — AB
SPECIFIC GRAVITY, URINE: 1.026 (ref 1.005–1.030)
pH: 5 (ref 5.0–8.0)

## 2016-10-30 LAB — PREGNANCY, URINE: Preg Test, Ur: NEGATIVE

## 2016-10-30 LAB — URINALYSIS, MICROSCOPIC (REFLEX)

## 2016-10-30 MED ORDER — ONDANSETRON HCL 4 MG/2ML IJ SOLN
4.0000 mg | Freq: Once | INTRAMUSCULAR | Status: DC
  Filled 2016-10-30: qty 2

## 2016-10-30 MED ORDER — SODIUM CHLORIDE 0.9 % IV SOLN: INTRAVENOUS | Status: DC

## 2016-10-30 MED ORDER — SODIUM CHLORIDE 0.9 % IV BOLUS (SEPSIS): 250.0000 mL | Freq: Once | INTRAVENOUS | Status: DC

## 2016-10-30 NOTE — ED Notes (Signed)
Pt not located in room; gown on bed.

## 2016-10-30 NOTE — ED Triage Notes (Signed)
Upper abd pain with nausea since this morning, hx of pancreatitis.

## 2016-12-24 ENCOUNTER — Encounter: Payer: Self-pay | Admitting: Physician Assistant

## 2016-12-25 NOTE — Telephone Encounter (Signed)
Please send message to patient -- she needs appointment to assess current symptoms and medication changes for diabetes. Do not believe metformin is the major issue here.

## 2017-01-05 ENCOUNTER — Encounter (HOSPITAL_COMMUNITY): Payer: Self-pay | Admitting: *Deleted

## 2017-01-05 ENCOUNTER — Emergency Department (HOSPITAL_COMMUNITY)
Admission: EM | Admit: 2017-01-05 | Discharge: 2017-01-05 | Disposition: A | Discharge status: Home / Self Care | Payer: Self-pay | Attending: Emergency Medicine | Admitting: Emergency Medicine

## 2017-01-05 DIAGNOSIS — K859 Acute pancreatitis without necrosis or infection, unspecified: Secondary | ICD-10-CM | POA: Insufficient documentation

## 2017-01-05 DIAGNOSIS — E119 Type 2 diabetes mellitus without complications: Secondary | ICD-10-CM | POA: Insufficient documentation

## 2017-01-05 DIAGNOSIS — Z79899 Other long term (current) drug therapy: Secondary | ICD-10-CM | POA: Insufficient documentation

## 2017-01-05 DIAGNOSIS — Z87891 Personal history of nicotine dependence: Secondary | ICD-10-CM | POA: Insufficient documentation

## 2017-01-05 DIAGNOSIS — Z794 Long term (current) use of insulin: Secondary | ICD-10-CM | POA: Insufficient documentation

## 2017-01-05 LAB — URINALYSIS, ROUTINE W REFLEX MICROSCOPIC
BILIRUBIN URINE: NEGATIVE
Glucose, UA: NEGATIVE mg/dL
Hgb urine dipstick: NEGATIVE
Ketones, ur: 5 mg/dL — AB
LEUKOCYTES UA: NEGATIVE
Nitrite: NEGATIVE
PH: 6 (ref 5.0–8.0)
Protein, ur: 100 mg/dL — AB
SPECIFIC GRAVITY, URINE: 1.018 (ref 1.005–1.030)

## 2017-01-05 LAB — I-STAT CHEM 8, ED
BUN: 9 mg/dL (ref 6–20)
CALCIUM ION: 1.03 mmol/L — AB (ref 1.15–1.40)
CHLORIDE: 103 mmol/L (ref 101–111)
Creatinine, Ser: 0.5 mg/dL (ref 0.44–1.00)
GLUCOSE: 172 mg/dL — AB (ref 65–99)
HCT: 39 % (ref 36.0–46.0)
Hemoglobin: 13.3 g/dL (ref 12.0–15.0)
POTASSIUM: 4.4 mmol/L (ref 3.5–5.1)
Sodium: 134 mmol/L — ABNORMAL LOW (ref 135–145)
TCO2: 27 mmol/L (ref 0–100)

## 2017-01-05 LAB — CBC
HCT: 42.7 % (ref 36.0–46.0)
Hemoglobin: 15.8 g/dL — ABNORMAL HIGH (ref 12.0–15.0)
MCH: 31.8 pg (ref 26.0–34.0)
MCHC: 37 g/dL — ABNORMAL HIGH (ref 30.0–36.0)
MCV: 85.9 fL (ref 78.0–100.0)
PLATELETS: 448 10*3/uL — AB (ref 150–400)
RBC: 4.97 MIL/uL (ref 3.87–5.11)
RDW: 16.2 % — AB (ref 11.5–15.5)
WBC: 12.6 10*3/uL — AB (ref 4.0–10.5)

## 2017-01-05 LAB — COMPREHENSIVE METABOLIC PANEL
ALBUMIN: 3.3 g/dL — AB (ref 3.5–5.0)
ALK PHOS: 80 U/L (ref 38–126)
Anion gap: 8 (ref 5–15)
BILIRUBIN TOTAL: 3.3 mg/dL — AB (ref 0.3–1.2)
BUN: 8 mg/dL (ref 6–20)
CALCIUM: 8.2 mg/dL — AB (ref 8.9–10.3)
CO2: 22 mmol/L (ref 22–32)
Chloride: 98 mmol/L — ABNORMAL LOW (ref 101–111)
Creatinine, Ser: 0.39 mg/dL — ABNORMAL LOW (ref 0.44–1.00)
GFR calc Af Amer: >60 mL/min (ref >60)
GFR calc non Af Amer: >60 mL/min (ref >60)
GLUCOSE: 163 mg/dL — AB (ref 65–99)
Potassium: 6.4 mmol/L (ref 3.5–5.1)
Sodium: 128 mmol/L — ABNORMAL LOW (ref 135–145)

## 2017-01-05 LAB — I-STAT BETA HCG BLOOD, ED (MC, WL, AP ONLY): I-stat hCG, quantitative: <5 m[IU]/mL (ref <5)

## 2017-01-05 LAB — LIPASE, BLOOD: Lipase: 257 U/L — ABNORMAL HIGH (ref 11–51)

## 2017-01-05 MED ORDER — HYDROCODONE-ACETAMINOPHEN 5-325 MG PO TABS: 1.0000 | ORAL_TABLET | Freq: Four times a day (QID) | ORAL | 0 refills | Status: DC | PRN

## 2017-01-05 MED ORDER — ONDANSETRON HCL 4 MG/2ML IJ SOLN
4.0000 mg | Freq: Once | INTRAMUSCULAR | Status: AC
  Administered 2017-01-05: 4 mg via INTRAVENOUS
  Filled 2017-01-05: qty 2

## 2017-01-05 MED ORDER — ONDANSETRON HCL 4 MG PO TABS: 4.0000 mg | ORAL_TABLET | Freq: Three times a day (TID) | ORAL | 0 refills | Status: DC | PRN

## 2017-01-05 MED ORDER — HYDROMORPHONE HCL 1 MG/ML IJ SOLN
1.0000 mg | Freq: Once | INTRAMUSCULAR | Status: AC
  Administered 2017-01-05: 1 mg via INTRAVENOUS
  Filled 2017-01-05: qty 1

## 2017-01-05 MED ORDER — SODIUM CHLORIDE 0.9 % IV BOLUS (SEPSIS)
1000.0000 mL | Freq: Once | INTRAVENOUS | Status: AC
  Administered 2017-01-05: 1000 mL via INTRAVENOUS

## 2017-01-05 NOTE — ED Triage Notes (Signed)
Pt reports upper abd pain since this am. Having n/v. Denies diarrhea. Hx of pancreatitis and states this feels the same.

## 2017-01-05 NOTE — ED Notes (Signed)
Pt sats 89%; placed on 2L [place]

## 2017-01-05 NOTE — Discharge Instructions (Signed)
Please drink clear fluids advance to diabetic diet as tolerated. Please followup with primary doctor in 3 days for re-evaluation. Please return for worsening symptoms or if unable to tolerate anything by mouth
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
## 2017-01-07 ENCOUNTER — Telehealth: Payer: Self-pay

## 2017-01-07 NOTE — Telephone Encounter (Signed)
LM requesting CB to schedule Emergency Department follow up.

## 2017-01-08 NOTE — Telephone Encounter (Signed)
2nd attempt to contact pt to schedule ED f/u appt

## 2017-01-11 ENCOUNTER — Telehealth: Payer: Self-pay | Admitting: Physician Assistant

## 2017-01-11 ENCOUNTER — Ambulatory Visit (INDEPENDENT_AMBULATORY_CARE_PROVIDER_SITE_OTHER): Payer: Self-pay | Admitting: Physician Assistant

## 2017-01-11 ENCOUNTER — Other Ambulatory Visit: Payer: Self-pay | Admitting: Physician Assistant

## 2017-01-11 ENCOUNTER — Encounter: Payer: Self-pay | Admitting: Physician Assistant

## 2017-01-11 VITALS — BP 124/82 | HR 84 | Temp 98.4°F | Resp 14 | Ht 65.0 in | Wt 198.0 lb

## 2017-01-11 DIAGNOSIS — IMO0002 Reserved for concepts with insufficient information to code with codable children: Secondary | ICD-10-CM

## 2017-01-11 DIAGNOSIS — R0681 Apnea, not elsewhere classified: Secondary | ICD-10-CM

## 2017-01-11 DIAGNOSIS — Z794 Long term (current) use of insulin: Secondary | ICD-10-CM

## 2017-01-11 DIAGNOSIS — E1169 Type 2 diabetes mellitus with other specified complication: Secondary | ICD-10-CM

## 2017-01-11 DIAGNOSIS — K859 Acute pancreatitis without necrosis or infection, unspecified: Secondary | ICD-10-CM

## 2017-01-11 DIAGNOSIS — E118 Type 2 diabetes mellitus with unspecified complications: Secondary | ICD-10-CM

## 2017-01-11 DIAGNOSIS — E1165 Type 2 diabetes mellitus with hyperglycemia: Secondary | ICD-10-CM

## 2017-01-11 DIAGNOSIS — E785 Hyperlipidemia, unspecified: Secondary | ICD-10-CM

## 2017-01-11 DIAGNOSIS — E781 Pure hyperglyceridemia: Secondary | ICD-10-CM

## 2017-01-11 LAB — LDL CHOLESTEROL, DIRECT: Direct LDL: 31 mg/dL

## 2017-01-11 LAB — HEMOGLOBIN A1C: HEMOGLOBIN A1C: 9.3 % — AB (ref 4.6–6.5)

## 2017-01-11 LAB — CBC
HCT: 42.4 % (ref 36.0–46.0)
Hemoglobin: 14.3 g/dL (ref 12.0–15.0)
MCHC: 33.7 g/dL (ref 30.0–36.0)
MCV: 86.3 fl (ref 78.0–100.0)
PLATELETS: 351 10*3/uL (ref 150.0–400.0)
RBC: 4.92 Mil/uL (ref 3.87–5.11)
RDW: 16.1 % — ABNORMAL HIGH (ref 11.5–15.5)
WBC: 6.7 10*3/uL (ref 4.0–10.5)

## 2017-01-11 LAB — COMPREHENSIVE METABOLIC PANEL
ALK PHOS: 79 U/L (ref 39–117)
ALT: 18 U/L (ref 0–35)
AST: 15 U/L (ref 0–37)
Albumin: 3.9 g/dL (ref 3.5–5.2)
BILIRUBIN TOTAL: 0.4 mg/dL (ref 0.2–1.2)
BUN: 11 mg/dL (ref 6–23)
CALCIUM: 9.1 mg/dL (ref 8.4–10.5)
CO2: 29 mEq/L (ref 19–32)
CREATININE: 0.63 mg/dL (ref 0.40–1.20)
Chloride: 103 mEq/L (ref 96–112)
GFR: 107.51 mL/min (ref >60.00)
GLUCOSE: 156 mg/dL — AB (ref 70–99)
Potassium: 4.3 mEq/L (ref 3.5–5.1)
Sodium: 139 mEq/L (ref 135–145)
TOTAL PROTEIN: 7.1 g/dL (ref 6.0–8.3)

## 2017-01-11 LAB — LIPID PANEL
CHOL/HDL RATIO: 8
CHOLESTEROL: 159 mg/dL (ref 0–200)
HDL: 19.4 mg/dL — ABNORMAL LOW (ref >39.00)
Triglycerides: 805 mg/dL — ABNORMAL HIGH (ref 0.0–149.0)

## 2017-01-11 LAB — LIPASE: LIPASE: 69 U/L — AB (ref 11.0–59.0)

## 2017-01-11 MED ORDER — CYCLOBENZAPRINE HCL 10 MG PO TABS
10.0000 mg | ORAL_TABLET | Freq: Every day | ORAL | 0 refills | Status: DC
Start: 2017-01-11 — End: 2017-06-04

## 2017-01-11 NOTE — Telephone Encounter (Signed)
I have just re-sent the prescription.

## 2017-01-11 NOTE — Patient Instructions (Signed)
Please go to the lab for blood work. I will call you with your results.  Please take mediations every day as directed. Do not skip doses. We will alter regimen based on results.  Please schedule an appointment for PAP smear. I am setting you up with Pulmonology for sleep apnea assessment.

## 2017-01-11 NOTE — Progress Notes (Signed)
Patient with history of pancreatitis presents to clinic today for ER follow-up. Patient presented to the ER on 01/05/17 with complaints of burning pain in epigastric area, LUQ and RUQ. ER workup included EKG (unchanged from prior tracings), and labs revealing leukocytosis and elevated lipase. Patient was given IV fluids along with pain medication and antiemetics. Was instructed to schedule close follow-up with PCP.  Since discharge, patient notes doing very well. Is sticking to a mostly liquid diet. Notes resolution of nausea and pain. Some mild residual soreness of upper abdomen is present. Patient endorses that she has not been adherent with her medications despite prior history of severe hypertriglyceridemia (even managed by Lipid clinic), uncontrolled diabetes and prior bouts with pancreatitis. Patient states she has restarted all medications and is taking as directed. Currently on 16 units of her insulin daily. Notes fasting sugars have been averaging 160-170s now that she has restarted medication. Denies new or worsening symptoms.   Past Medical History:  Diagnosis Date  . Chronic ear infection   . Diabetes mellitus type II, controlled ([place])   . Hyperlipidemia   . Pancreatitis, acute     Current Outpatient Prescriptions on File Prior to Visit  Medication Sig Dispense Refill  . atorvastatin (LIPITOR) 40 MG tablet Take 1 tablet (40 mg total) by mouth daily. 30 tablet 5  . fenofibrate 160 MG tablet Take 1 tablet (160 mg total) by mouth daily. 30 tablet 5  . glucose blood test strip Use as instructed 100 each 12  . Insulin Pen Needle (1ST CHOICE PEN NEEDLES) 31G X 6 MM MISC For insulin injections as directed 100 each 0  . Lancets (FREESTYLE) lancets Use as instructed 100 each 12  . lisinopril (PRINIVIL,ZESTRIL) 10 MG tablet TAKE ONE TABLET BY MOUTH ONCE DAILY 30 tablet 5  . metFORMIN (GLUCOPHAGE) 500 MG tablet Take 2 tablet (1000 mg) in morning and 1 tablet (500 mg) in evening for 2 weeks,  then increase to 2 tablet (1000 mg) twice daily 120 tablet 1  . Omega-3 Fatty Acids (FISH OIL) 1000 MG CAPS Take 4,000 mg by mouth.      No current facility-administered medications on file prior to visit.     Allergies  Allergen Reactions  . Novocain [Procaine] Nausea And Vomiting    Family History  Problem Relation Age of Onset  . Heart attack Father 52       Deceased  . Diabetes Father   . Diabetes Mother   . Hypertension Mother        Living  . Alzheimer's disease Maternal Grandfather   . Healthy Brother   . Healthy Daughter   . Diabetes Paternal Uncle     Social History   Social History  . Marital status: Married    Spouse name: N/A  . Number of children: N/A  . Years of education: N/A   Social History Main Topics  . Smoking status: Former Smoker    Quit date: 05/22/2015  . Smokeless tobacco: Never Used  . Alcohol use No  . Drug use: No  . Sexual activity: Not Asked   Other Topics Concern  . None   Social History Narrative  . None    Review of Systems - See HPI.  All other ROS are negative.  BP 124/82   Pulse 84   Temp 98.4 F (36.9 C) (Oral)   Resp 14   Ht 5' 5" (1.651 m)   Wt 198 lb (89.8 kg)   SpO2 98%  BMI 32.95 kg/m   Physical Exam  Constitutional: She is oriented to person, place, and time and well-developed, well-nourished, and in no distress.  HENT:  Head: Normocephalic and atraumatic.  Eyes: Conjunctivae are normal.  Neck: Neck supple.  Cardiovascular: Normal rate, regular rhythm, normal heart sounds and intact distal pulses.   Pulmonary/Chest: Effort normal and breath sounds normal. No respiratory distress. She has no wheezes. She has no rales. She exhibits no tenderness.  Abdominal: Soft. Bowel sounds are normal. She exhibits no distension and no mass. There is no tenderness. There is no rebound and no guarding.  Neurological: She is alert and oriented to person, place, and time.  Skin: Skin is warm and dry. No rash noted.    Psychiatric: Affect normal.  Vitals reviewed.   Recent Results (from the past 2160 hour(s))  Urinalysis, Routine w reflex microscopic     Status: Abnormal   Collection Time: 10/30/16  5:03 PM  Result Value Ref Range   Color, Urine AMBER (A) YELLOW    Comment: BIOCHEMICALS MAY BE AFFECTED BY COLOR   APPearance TURBID (A) CLEAR   Specific Gravity, Urine 1.026 1.005 - 1.030   pH 5.0 5.0 - 8.0   Glucose, UA 250 (A) NEGATIVE mg/dL   Hgb urine dipstick TRACE (A) NEGATIVE   Bilirubin Urine SMALL (A) NEGATIVE   Ketones, ur 15 (A) NEGATIVE mg/dL   Protein, ur >300 (A) NEGATIVE mg/dL   Nitrite NEGATIVE NEGATIVE   Leukocytes, UA NEGATIVE NEGATIVE  Pregnancy, urine     Status: None   Collection Time: 10/30/16  5:03 PM  Result Value Ref Range   Preg Test, Ur NEGATIVE NEGATIVE    Comment:        THE SENSITIVITY OF THIS METHODOLOGY IS >20 mIU/mL.   Urinalysis, Microscopic (reflex)     Status: Abnormal   Collection Time: 10/30/16  5:03 PM  Result Value Ref Range   RBC / HPF 0-5 0 - 5 RBC/hpf   WBC, UA 0-5 0 - 5 WBC/hpf   Bacteria, UA RARE (A) NONE SEEN   Squamous Epithelial / LPF 0-5 (A) NONE SEEN   Amorphous Crystal PRESENT   CBC     Status: Abnormal   Collection Time: 01/05/17  2:37 PM  Result Value Ref Range   WBC 12.6 (H) 4.0 - 10.5 K/uL   RBC 4.97 3.87 - 5.11 MIL/uL   Hemoglobin 15.8 (H) 12.0 - 15.0 g/dL   HCT 42.7 36.0 - 46.0 %   MCV 85.9 78.0 - 100.0 fL   MCH 31.8 26.0 - 34.0 pg   MCHC 37.0 (H) 30.0 - 36.0 g/dL   RDW 16.2 (H) 11.5 - 15.5 %   Platelets 448 (H) 150 - 400 K/uL  Comprehensive metabolic panel     Status: Abnormal   Collection Time: 01/05/17  3:57 PM  Result Value Ref Range   Sodium 128 (L) 135 - 145 mmol/L   Potassium 6.4 (HH) 3.5 - 5.1 mmol/L    Comment: MARKED HEMOLYSIS HEMOLYSIS AT THIS LEVEL MAY AFFECT RESULT CRITICAL RESULT CALLED TO, READ BACK BY AND VERIFIED WITH: RELEASE PER DR G MU AT 1716 [id number] MARTINB    Chloride 98 (L) 101 - 111 mmol/L    CO2 22 22 - 32 mmol/L   Glucose, Bld 163 (H) 65 - 99 mg/dL   BUN 8 6 - 20 mg/dL   Creatinine, Ser 0.39 (L) 0.44 - 1.00 mg/dL    Comment: POST-ULTRACENTRIFUGATION   Calcium 8.2 (L) 8.9 -  10.3 mg/dL   Total Protein RESULTS UNAVAILABLE DUE TO INTERFERING SUBSTANCE 6.5 - 8.1 g/dL   Albumin 3.3 (L) 3.5 - 5.0 g/dL   AST RESULTS UNAVAILABLE DUE TO INTERFERING SUBSTANCE 15 - 41 U/L   ALT RESULTS UNAVAILABLE DUE TO INTERFERING SUBSTANCE 14 - 54 U/L   Alkaline Phosphatase 80 38 - 126 U/L   Total Bilirubin 3.3 (H) 0.3 - 1.2 mg/dL   GFR calc non Af Amer >60 >60 mL/min   GFR calc Af Amer >60 >60 mL/min    Comment: (NOTE) The eGFR has been calculated using the CKD EPI equation. This calculation has not been validated in all clinical situations. eGFR's persistently <60 mL/min signify possible Chronic Kidney Disease.    Anion gap 8 5 - 15  Lipase, blood     Status: Abnormal   Collection Time: 01/05/17  3:57 PM  Result Value Ref Range   Lipase 257 (H) 11 - 51 U/L  I-Stat Beta hCG blood, ED ([facility], [facility], [facility] only)     Status: None   Collection Time: 01/05/17  3:57 PM  Result Value Ref Range   I-stat hCG, quantitative <5.0 <5 mIU/mL   Comment 3            Comment:   GEST. AGE      CONC.  (mIU/mL)   <=1 WEEK        5 - 50     2 WEEKS       50 - 500     3 WEEKS       100 - 10,000     4 WEEKS     1,000 - 30,000        FEMALE AND NON-PREGNANT FEMALE:     LESS THAN 5 mIU/mL   Urinalysis, Routine w reflex microscopic     Status: Abnormal   Collection Time: 01/05/17  5:35 PM  Result Value Ref Range   Color, Urine YELLOW YELLOW   APPearance HAZY (A) CLEAR   Specific Gravity, Urine 1.018 1.005 - 1.030   pH 6.0 5.0 - 8.0   Glucose, UA NEGATIVE NEGATIVE mg/dL   Hgb urine dipstick NEGATIVE NEGATIVE   Bilirubin Urine NEGATIVE NEGATIVE   Ketones, ur 5 (A) NEGATIVE mg/dL   Protein, ur 100 (A) NEGATIVE mg/dL   Nitrite NEGATIVE NEGATIVE   Leukocytes, UA NEGATIVE NEGATIVE   RBC / HPF 0-5 0 - 5 RBC/hpf    WBC, UA 0-5 0 - 5 WBC/hpf   Bacteria, UA RARE (A) NONE SEEN   Squamous Epithelial / LPF 6-30 (A) NONE SEEN   Mucous PRESENT   I-Stat Chem 8, ED     Status: Abnormal   Collection Time: 01/05/17  5:59 PM  Result Value Ref Range   Sodium 134 (L) 135 - 145 mmol/L   Potassium 4.4 3.5 - 5.1 mmol/L   Chloride 103 101 - 111 mmol/L   BUN 9 6 - 20 mg/dL   Creatinine, Ser 0.50 0.44 - 1.00 mg/dL   Glucose, Bld 172 (H) 65 - 99 mg/dL   Calcium, Ion 1.03 (L) 1.15 - 1.40 mmol/L   TCO2 27 0 - 100 mmol/L   Hemoglobin 13.3 12.0 - 15.0 g/dL   HCT 39.0 36.0 - 46.0 %  Comp Met (CMET)     Status: Abnormal   Collection Time: 01/11/17  9:51 AM  Result Value Ref Range   Sodium 139 135 - 145 mEq/L   Potassium 4.3 3.5 - 5.1 mEq/L   Chloride  103 96 - 112 mEq/L   CO2 29 19 - 32 mEq/L   Glucose, Bld 156 (H) 70 - 99 mg/dL   BUN 11 6 - 23 mg/dL   Creatinine, Ser 0.63 0.40 - 1.20 mg/dL   Total Bilirubin 0.4 0.2 - 1.2 mg/dL   Alkaline Phosphatase 79 39 - 117 U/L   AST 15 0 - 37 U/L   ALT 18 0 - 35 U/L   Total Protein 7.1 6.0 - 8.3 g/dL   Albumin 3.9 3.5 - 5.2 g/dL   Calcium 9.1 8.4 - 10.5 mg/dL   GFR 107.51 >60.00 mL/min  CBC     Status: Abnormal   Collection Time: 01/11/17  9:51 AM  Result Value Ref Range   WBC 6.7 4.0 - 10.5 K/uL   RBC 4.92 3.87 - 5.11 Mil/uL   Platelets 351.0 150.0 - 400.0 K/uL   Hemoglobin 14.3 12.0 - 15.0 g/dL   HCT 42.4 36.0 - 46.0 %   MCV 86.3 78.0 - 100.0 fl   MCHC 33.7 30.0 - 36.0 g/dL   RDW 16.1 (H) 11.5 - 15.5 %  Hemoglobin A1c     Status: Abnormal   Collection Time: 01/11/17  9:51 AM  Result Value Ref Range   Hgb A1c MFr Bld 9.3 (H) 4.6 - 6.5 %    Comment: Glycemic Control Guidelines for People with Diabetes:Non Diabetic:  <6%Goal of Therapy: <7%Additional Action Suggested:  >8%   Lipase     Status: Abnormal   Collection Time: 01/11/17  9:51 AM  Result Value Ref Range   Lipase 69.0 (H) 11.0 - 59.0 U/L  Lipid panel     Status: Abnormal   Collection Time: 01/11/17   9:51 AM  Result Value Ref Range   Cholesterol 159 0 - 200 mg/dL    Comment: ATP III Classification       Desirable:  < 200 mg/dL               Borderline High:  200 - 239 mg/dL          High:  > = 240 mg/dL   Triglycerides (H) 0.0 - 149.0 mg/dL    805.0 Triglyceride is over 400; calculations on Lipids are invalid.    Comment: Normal:  <150 mg/dLBorderline High:  150 - 199 mg/dL   HDL 19.40 (L) >39.00 mg/dL   Total CHOL/HDL Ratio 8     Comment:                Men          Women1/2 Average Risk     3.4          3.3Average Risk          5.0          4.42X Average Risk          9.6          7.13X Average Risk          15.0          11.0                      LDL cholesterol, direct     Status: None   Collection Time: 01/11/17  9:51 AM  Result Value Ref Range   Direct LDL 31.0 mg/dL    Comment: Optimal:  <100 mg/dLNear or Above Optimal:  100-129 mg/dLBorderline High:  130-159 mg/dLHigh:  160-189 mg/dLVery High:  >190 mg/dL   Assessment/Plan:  Acute pancreatitis Resolved clinically. Repeat labs today to ensure resolution of leukocytosis and that lipase is trending down. Dietary recommendations given. Discussed that non-adherence to medications was a huge part in this acute inflammation. She is taking all medications as directed. Will alter regimen according to results.   Hyperlipidemia associated with type 2 diabetes mellitus ([place]) Patient has been non-adherent. Has restarted medications as directed. Dietary and exercise recommendations have been given. Follow-up scheduled.  Type II diabetes mellitus, uncontrolled ([facility]) Fasting sugars are now averaging 160s with resumption of insulin. Discussed with her that it is very important that she takes medications as directed. Repeat labs today. Will alter regimen based on results. Will refer back to Endocrinology.  Apneic episode Patient noted at end of visit. Concern for sleep apnea. Will refer to Pulmonology for sleep assessment.    [name], PA-C

## 2017-01-11 NOTE — Progress Notes (Signed)
Pre visit review using our clinic review tool, if applicable. No additional management support is needed unless otherwise documented below in the visit note. 

## 2017-01-11 NOTE — Telephone Encounter (Signed)
Was there a discussion about refilling a muscle relaxer for patient.  Please advise

## 2017-01-11 NOTE — Telephone Encounter (Signed)
At check out pt asked if muscle relaxer had been called into pharmacy, please call and confirm with pt

## 2017-01-13 DIAGNOSIS — K859 Acute pancreatitis without necrosis or infection, unspecified: Secondary | ICD-10-CM | POA: Insufficient documentation

## 2017-01-13 DIAGNOSIS — R0681 Apnea, not elsewhere classified: Secondary | ICD-10-CM | POA: Insufficient documentation

## 2017-01-13 NOTE — Assessment & Plan Note (Signed)
Fasting sugars are now averaging 160s with resumption of insulin. Discussed with her that it is very important that she takes medications as directed. Repeat labs today. Will alter regimen based on results. Will refer back to Endocrinology.

## 2017-01-13 NOTE — Assessment & Plan Note (Signed)
Patient has been non-adherent. Has restarted medications as directed. Dietary and exercise recommendations have been given. Follow-up scheduled.

## 2017-01-13 NOTE — Assessment & Plan Note (Signed)
Resolved clinically. Repeat labs today to ensure resolution of leukocytosis and that lipase is trending down. Dietary recommendations given. Discussed that non-adherence to medications was a huge part in this acute inflammation. She is taking all medications as directed. Will alter regimen according to results.

## 2017-01-13 NOTE — Assessment & Plan Note (Signed)
Patient noted at end of visit. Concern for sleep apnea. Will refer to Pulmonology for sleep assessment.

## 2017-01-14 ENCOUNTER — Other Ambulatory Visit: Payer: Self-pay | Admitting: Physician Assistant

## 2017-01-14 DIAGNOSIS — Z794 Long term (current) use of insulin: Principal | ICD-10-CM

## 2017-01-14 DIAGNOSIS — E118 Type 2 diabetes mellitus with unspecified complications: Principal | ICD-10-CM

## 2017-01-14 DIAGNOSIS — E1165 Type 2 diabetes mellitus with hyperglycemia: Secondary | ICD-10-CM

## 2017-01-14 DIAGNOSIS — IMO0002 Reserved for concepts with insufficient information to code with codable children: Secondary | ICD-10-CM

## 2017-04-01 ENCOUNTER — Telehealth: Payer: Self-pay | Admitting: Emergency Medicine

## 2017-04-01 NOTE — Telephone Encounter (Signed)
LMOVM advising patient she is due for a Diabetic follow up. We referred to Endocrinology but has not scheduled.

## 2017-04-25 ENCOUNTER — Institutional Professional Consult (permissible substitution): Payer: Self-pay | Admitting: Pulmonary Disease

## 2017-05-08 ENCOUNTER — Ambulatory Visit (INDEPENDENT_AMBULATORY_CARE_PROVIDER_SITE_OTHER): Payer: Self-pay | Admitting: Physician Assistant

## 2017-05-08 ENCOUNTER — Other Ambulatory Visit: Payer: Self-pay | Admitting: Emergency Medicine

## 2017-05-08 ENCOUNTER — Encounter: Payer: Self-pay | Admitting: Physician Assistant

## 2017-05-08 VITALS — BP 120/90 | HR 96 | Temp 98.4°F | Resp 14 | Ht 65.0 in | Wt 200.0 lb

## 2017-05-08 DIAGNOSIS — H6502 Acute serous otitis media, left ear: Secondary | ICD-10-CM

## 2017-05-08 DIAGNOSIS — J069 Acute upper respiratory infection, unspecified: Secondary | ICD-10-CM

## 2017-05-08 DIAGNOSIS — E1165 Type 2 diabetes mellitus with hyperglycemia: Secondary | ICD-10-CM

## 2017-05-08 DIAGNOSIS — B9789 Other viral agents as the cause of diseases classified elsewhere: Secondary | ICD-10-CM

## 2017-05-08 LAB — COMPREHENSIVE METABOLIC PANEL WITH GFR
ALT: 14 U/L (ref 0–35)
AST: 10 U/L (ref 0–37)
Albumin: 3.7 g/dL (ref 3.5–5.2)
Alkaline Phosphatase: 87 U/L (ref 39–117)
BUN: 8 mg/dL (ref 6–23)
CO2: 27 meq/L (ref 19–32)
Calcium: 8.9 mg/dL (ref 8.4–10.5)
Chloride: 95 meq/L — ABNORMAL LOW (ref 96–112)
Creatinine, Ser: 0.63 mg/dL (ref 0.40–1.20)
GFR: 107.37 mL/min
Glucose, Bld: 258 mg/dL — ABNORMAL HIGH (ref 70–99)
Potassium: 4.2 meq/L (ref 3.5–5.1)
Sodium: 131 meq/L — ABNORMAL LOW (ref 135–145)
Total Bilirubin: 0.5 mg/dL (ref 0.2–1.2)
Total Protein: 6.8 g/dL (ref 6.0–8.3)

## 2017-05-08 LAB — HEMOGLOBIN A1C: Hgb A1c MFr Bld: 10.6 % — ABNORMAL HIGH (ref 4.6–6.5)

## 2017-05-08 MED ORDER — INSULIN DETEMIR 100 UNIT/ML FLEXPEN: 18.0000 [IU] | PEN_INJECTOR | Freq: Every day | SUBCUTANEOUS | 3 refills | Status: DC

## 2017-05-08 MED ORDER — AMOXICILLIN 875 MG PO TABS: 875.0000 mg | ORAL_TABLET | Freq: Two times a day (BID) | ORAL | 0 refills | Status: DC

## 2017-05-08 NOTE — Progress Notes (Signed)
Pre visit review using our clinic review tool, if applicable. No additional management support is needed unless otherwise documented below in the visit note. 

## 2017-05-08 NOTE — Progress Notes (Signed)
Patient presents to clinic today c/o 1 day of nasal congestion, PND, cough that is productive of green sputum and L earache. Patient endorses fatigue. Denies drainage from ear or change in hearing. Notes some mild scratchy throat from PND. Denies fever, aches. Has taken Sudafed for symptoms relief. Notes multiple family members with similar symptoms. .  Patient also with DM II uncontrolled, non-adherent to follow-up. She is currently on a regimen of Metformin and Levemir. Endorses taking as directed. Has been checking sugars. Endorses fasting sugars averaging < 200 per patient. Usually 140-190. Has just started increased exercise regimen. Foot exam and eye exam up-to-date. Is on ACEI and taking as directed. Is overdue for labs. Has Endo appointment pending for 05/24/17.  Past Medical History:  Diagnosis Date  . Chronic ear infection   . Diabetes mellitus type II, controlled ([place])   . Hyperlipidemia   . Pancreatitis, acute     Current Outpatient Prescriptions on File Prior to Visit  Medication Sig Dispense Refill  . atorvastatin (LIPITOR) 40 MG tablet Take 1 tablet (40 mg total) by mouth daily. 30 tablet 5  . cyclobenzaprine (FLEXERIL) 10 MG tablet Take 1 tablet (10 mg total) by mouth at bedtime. 30 tablet 0  . fenofibrate 160 MG tablet Take 1 tablet (160 mg total) by mouth daily. 30 tablet 5  . glucose blood test strip Use as instructed 100 each 12  . Insulin Detemir (LEVEMIR FLEXTOUCH) 100 UNIT/ML Pen Inject 16 Units into the skin daily at 10 pm. 15 mL 3  . Insulin Pen Needle (1ST CHOICE PEN NEEDLES) 31G X 6 MM MISC For insulin injections as directed 100 each 0  . Lancets (FREESTYLE) lancets Use as instructed 100 each 12  . lisinopril (PRINIVIL,ZESTRIL) 10 MG tablet TAKE ONE TABLET BY MOUTH ONCE DAILY 30 tablet 5  . metFORMIN (GLUCOPHAGE) 500 MG tablet Take 2 tablet (1000 mg) in morning and 1 tablet (500 mg) in evening for 2 weeks, then increase to 2 tablet (1000 mg) twice daily 120 tablet  1  . Omega-3 Fatty Acids (FISH OIL) 1000 MG CAPS Take 4,000 mg by mouth.      No current facility-administered medications on file prior to visit.     Allergies  Allergen Reactions  . Novocain [Procaine] Nausea And Vomiting    Family History  Problem Relation Age of Onset  . Heart attack Father 44       Deceased  . Diabetes Father   . Diabetes Mother   . Hypertension Mother        Living  . Alzheimer's disease Maternal Grandfather   . Healthy Brother   . Healthy Daughter   . Diabetes Paternal Uncle     Social History   Social History  . Marital status: Married    Spouse name: N/A  . Number of children: N/A  . Years of education: N/A   Social History Main Topics  . Smoking status: Former Smoker    Quit date: 05/22/2015  . Smokeless tobacco: Never Used  . Alcohol use No  . Drug use: No  . Sexual activity: Not Asked   Other Topics Concern  . None   Social History Narrative  . None   Review of Systems - See HPI.  All other ROS are negative.  BP 120/90   Pulse 96   Temp 98.4 F (36.9 C) (Oral)   Resp 14   Ht 5' 5"  (1.651 m)   Wt 200 lb (90.7 kg)  SpO2 98%   BMI 33.28 kg/m   Physical Exam  Constitutional: She is oriented to person, place, and time and well-developed, well-nourished, and in no distress.  HENT:  Head: Normocephalic and atraumatic.  Right Ear: Tympanic membrane normal.  Left Ear: A middle ear effusion (serous) is present.  Nose: Mucosal edema and rhinorrhea present. Right sinus exhibits no maxillary sinus tenderness and no frontal sinus tenderness. Left sinus exhibits no maxillary sinus tenderness and no frontal sinus tenderness.  Mouth/Throat: Uvula is midline, oropharynx is clear and moist and mucous membranes are normal.  Eyes: Conjunctivae are normal.  Neck: Neck supple.  Cardiovascular: Normal rate, regular rhythm, normal heart sounds and intact distal pulses.   Pulmonary/Chest: Effort normal and breath sounds normal. No respiratory  distress. She has no wheezes. She has no rales. She exhibits no tenderness.  Neurological: She is alert and oriented to person, place, and time.  Skin: Skin is warm and dry. No rash noted.  Psychiatric: Affect normal.  Vitals reviewed.  Assessment/Plan: 1. Uncontrolled type 2 diabetes mellitus with hyperglycemia ([place]) Due for repeat labs. Will obtain today. Immunizations up-to-date. Foot exam and Eye exam up-to-date. Endo appt scheduled.  - Comp Met (CMET) - Hemoglobin A1c  2. Viral URI with cough Supportive measures and OTC medications reviewed.  3. Acute serous otitis media of left ear, recurrence not specified Patient with significant history of AOM. Erythema and effusion noted. Start Amoxicillin.    [name], PA-C

## 2017-05-08 NOTE — Patient Instructions (Signed)
Please start increased hydration. Get plenty of rest.  Be mindlful of how often you are taking the sudafed as it can affect your BP and heart rate. Start some Mucinex-DM instead to thin mucous and help with cough.  I have sent in an antibiotic for your ear infection. Take as directed with food.  Continue chronic medications.  We are rechecking A1C today as you are overdue.  This way we can alter regimen prior to your appointment with Dr. Loanne Drilling.

## 2017-05-12 ENCOUNTER — Encounter: Payer: Self-pay | Admitting: Physician Assistant

## 2017-05-13 MED ORDER — PROMETHAZINE-DM 6.25-15 MG/5ML PO SYRP: 5.0000 mL | ORAL_SOLUTION | Freq: Four times a day (QID) | ORAL | 0 refills | Status: DC | PRN

## 2017-05-24 ENCOUNTER — Encounter: Payer: Self-pay | Admitting: Endocrinology

## 2017-05-24 ENCOUNTER — Ambulatory Visit: Payer: Self-pay | Admitting: Endocrinology

## 2017-05-24 VITALS — BP 118/78 | HR 93 | Wt 203.4 lb

## 2017-05-24 DIAGNOSIS — E1165 Type 2 diabetes mellitus with hyperglycemia: Secondary | ICD-10-CM

## 2017-05-24 MED ORDER — INSULIN DETEMIR 100 UNIT/ML FLEXPEN: 30.0000 [IU] | PEN_INJECTOR | SUBCUTANEOUS | 3 refills | Status: DC

## 2017-05-24 NOTE — Patient Instructions (Addendum)
good diet and exercise significantly improve the control of your diabetes.  please let me know if you wish to be referred to a dietician.  high blood sugar is very risky to your health.  you should see an eye doctor and dentist every year.  It is very important to get all recommended vaccinations.  Controlling your blood pressure and cholesterol drastically reduces the damage diabetes does to your body.  Those who smoke should quit.  Please discuss these with your doctor.  check your blood sugar once a day.  vary the time of day when you check, between before the 3 meals, and at bedtime.  also check if you have symptoms of your blood sugar being too high or too low.  please keep a record of the readings and bring it to your next appointment here (or you can bring the meter itself).  You can write it on any piece of paper.  please call us sooner if your blood sugar goes below 70, or if you have a lot of readings over 200.   For now, please: Change the levmir to 30 units each morning. Please call or message [place] next week, to tell us how the blood sugar is doing. Please also call when the levemir is low, so we can change the levemir to [employer] "NPH" insulin.  It is not a unit-for-unit transition, so we'll see what your needs are then.   Please come back for a follow-up appointment in 3 months.

## 2017-05-24 NOTE — Progress Notes (Signed)
Subjective:    Patient ID: Teresa Parks, female    DOB: 1971/03/12, 46 y.o.   MRN: [id number]  HPI pt is referred by [name], PA,for diabetes.  Pt states DM was dx'ed in 2015, when she presented with pancreatitis due to hypertriglyceridemia; she has mild if any neuropathy of the lower extremities; she is unaware of any associated chronic complications; she has been on insulin since dx; pt says her diet and exercise are good; she has never had GDM, pancreatic surgery, severe hypoglycemia or DKA.  She does not have insurance.  He takes metformin and she varies levemir between 14-20 units qhs.  no cbg record, but states cbg's are approx 200.  She says she cannot afford levemir.  Past Medical History:  Diagnosis Date  . Chronic ear infection   . Diabetes mellitus type II, controlled ([place])   . Hyperlipidemia   . Pancreatitis, acute     Past Surgical History:  Procedure Laterality Date  . HERNIA REPAIR    . KNEE SURGERY    . WISDOM TOOTH EXTRACTION      Social History   Socioeconomic History  . Marital status: Married    Spouse name: Not on file  . Number of children: Not on file  . Years of education: Not on file  . Highest education level: Not on file  Social Needs  . Financial resource strain: Not on file  . Food insecurity - worry: Not on file  . Food insecurity - inability: Not on file  . Transportation needs - medical: Not on file  . Transportation needs - non-medical: Not on file  Occupational History  . Not on file  Tobacco Use  . Smoking status: Former Smoker    Last attempt to quit: 05/22/2015    Years since quitting: 2.0  . Smokeless tobacco: Never Used  Substance and Sexual Activity  . Alcohol use: No    Alcohol/week: 0.0 oz  . Drug use: No  . Sexual activity: Not on file  Other Topics Concern  . Not on file  Social History Narrative  . Not on file    Current Outpatient Medications on File Prior to Visit  Medication Sig Dispense Refill  .  atorvastatin (LIPITOR) 40 MG tablet Take 1 tablet (40 mg total) by mouth daily. 30 tablet 5  . cyclobenzaprine (FLEXERIL) 10 MG tablet Take 1 tablet (10 mg total) by mouth at bedtime. 30 tablet 0  . fenofibrate 160 MG tablet Take 1 tablet (160 mg total) by mouth daily. 30 tablet 5  . glucose blood test strip Use as instructed 100 each 12  . Insulin Pen Needle (1ST CHOICE PEN NEEDLES) 31G X 6 MM MISC For insulin injections as directed 100 each 0  . Lancets (FREESTYLE) lancets Use as instructed 100 each 12  . lisinopril (PRINIVIL,ZESTRIL) 10 MG tablet TAKE ONE TABLET BY MOUTH ONCE DAILY 30 tablet 5  . metFORMIN (GLUCOPHAGE) 500 MG tablet Take 2 tablet (1000 mg) in morning and 1 tablet (500 mg) in evening for 2 weeks, then increase to 2 tablet (1000 mg) twice daily 120 tablet 1  . Omega-3 Fatty Acids (FISH OIL) 1000 MG CAPS Take 4,000 mg by mouth.      No current facility-administered medications on file prior to visit.     Allergies  Allergen Reactions  . Novocain [Procaine] Nausea And Vomiting    Family History  Problem Relation Age of Onset  . Heart attack Father 2  Deceased  . Diabetes Father   . Diabetes Mother   . Hypertension Mother        Living  . Alzheimer's disease Maternal Grandfather   . Healthy Brother   . Healthy Daughter   . Diabetes Paternal Uncle     BP 118/78 (BP Location: Left Arm, Patient Position: Sitting, Cuff Size: Normal)   Pulse 93   Wt 203 lb 6.4 oz (92.3 kg)   SpO2 95%   BMI 33.85 kg/m    Review of Systems denies weight loss, blurry vision, headache, chest pain, sob, n/v, urinary frequency, muscle cramps, excessive diaphoresis, depression, cold intolerance, rhinorrhea, and easy bruising.     Objective:   Physical Exam VS: see vs page GEN: no distress HEAD: head: no deformity eyes: no periorbital swelling, no proptosis external nose and ears are normal mouth: no lesion seen NECK: supple, thyroid is not enlarged CHEST WALL: no  deformity LUNGS: clear to auscultation CV: reg rate and rhythm, no murmur ABD: abdomen is soft, nontender.  no hepatosplenomegaly.  not distended.  no hernia MUSCULOSKELETAL: muscle bulk and strength are grossly normal.  no obvious joint swelling.  gait is normal and steady EXTEMITIES: no deformity.  no ulcer on the feet.  feet are of normal color and temp.  no edema.  There is bilateral onychomycosis of the toenails PULSES: dorsalis pedis intact bilat.  no carotid bruit NEURO:  cn 2-12 grossly intact.   readily moves all 4's.  sensation is intact to touch on the feet.  SKIN:  Normal texture and temperature.  No rash or suspicious lesion is visible.   NODES:  None palpable at the neck PSYCH: alert, well-oriented.  Does not appear anxious nor depressed.  Lab Results  Component Value Date   HGBA1C 10.6 (H) 05/08/2017   Lab Results  Component Value Date   CREATININE 0.63 05/08/2017   BUN 8 05/08/2017   NA 131 (L) 05/08/2017   K 4.2 05/08/2017   CL 95 (L) 05/08/2017   CO2 27 05/08/2017      Assessment & Plan:  Insulin-requiring type 2 DM: poor glycemic control.   Hypertriglyceridemia.    Patient Instructions  good diet and exercise significantly improve the control of your diabetes.  please let me know if you wish to be referred to a dietician.  high blood sugar is very risky to your health.  you should see an eye doctor and dentist every year.  It is very important to get all recommended vaccinations.  Controlling your blood pressure and cholesterol drastically reduces the damage diabetes does to your body.  Those who smoke should quit.  Please discuss these with your doctor.  check your blood sugar once a day.  vary the time of day when you check, between before the 3 meals, and at bedtime.  also check if you have symptoms of your blood sugar being too high or too low.  please keep a record of the readings and bring it to your next appointment here (or you can bring the meter itself).   You can write it on any piece of paper.  please call us sooner if your blood sugar goes below 70, or if you have a lot of readings over 200.   For now, please: Change the levmir to 30 units each morning. Please call or message [place] next week, to tell us how the blood sugar is doing. Please also call when the levemir is low, so we can change the levemir to  [employer] "NPH" insulin.  It is not a unit-for-unit transition, so we'll see what your needs are then.   Please come back for a follow-up appointment in 3 months.

## 2017-06-04 ENCOUNTER — Other Ambulatory Visit: Payer: Self-pay | Admitting: Physician Assistant

## 2017-06-04 ENCOUNTER — Encounter: Payer: Self-pay | Admitting: Physician Assistant

## 2017-06-05 ENCOUNTER — Other Ambulatory Visit: Payer: Self-pay | Admitting: Physician Assistant

## 2017-06-05 MED ORDER — BENZONATATE 100 MG PO CAPS: 100.0000 mg | ORAL_CAPSULE | Freq: Two times a day (BID) | ORAL | 0 refills | Status: DC | PRN

## 2017-06-05 MED ORDER — CYCLOBENZAPRINE HCL 10 MG PO TABS: 10.0000 mg | ORAL_TABLET | Freq: Every day | ORAL | 0 refills | Status: DC

## 2017-06-05 NOTE — Telephone Encounter (Signed)
Patient is aware that medication has been sent.   

## 2017-06-05 NOTE — Telephone Encounter (Signed)
I do see where mychart messages were sent about cough medicine.  Routed to provider to advise.

## 2017-06-05 NOTE — Telephone Encounter (Signed)
I have sent in Teresa Parks for the cough.

## 2017-06-05 NOTE — Telephone Encounter (Signed)
Copied from [place] [id number]. Topic: General - Other >> Jun 05, 2017 12:59 PM Teresa Parks wrote: Reason for CRM:  patient states that she was looking for a cough medicine to be sent to [facility] she thought that the doctor was going to send some into the pharmacy please call patient

## 2017-06-05 NOTE — Addendum Note (Signed)
Addended by: Brunetta Jeans on: 06/05/2017 01:33 PM   Modules accepted: Orders

## 2017-06-27 ENCOUNTER — Telehealth: Payer: Self-pay | Admitting: Endocrinology

## 2017-06-27 MED ORDER — INSULIN NPH (HUMAN) (ISOPHANE) 100 UNIT/ML ~~LOC~~ SUSP: 30.0000 [IU] | SUBCUTANEOUS | 11 refills | Status: DC

## 2017-06-27 NOTE — Telephone Encounter (Signed)
Patient was told to call when low on current insulin-she needs prescription for new insulin (something different) sent to [facility] in [place].

## 2017-06-27 NOTE — Telephone Encounter (Signed)
Ok, but in order to know how much to start with, please tell us how cbg's are.

## 2017-06-27 NOTE — Telephone Encounter (Signed)
When I called patient she stated that she was not at home at the time so she couldn't give me exact CBG's. She said that she had been checking in the morning before breakfast as requested & that he numbers were usually between 170-200.

## 2017-06-27 NOTE — Telephone Encounter (Signed)
Left patient VM that prescription was sent in.

## 2017-06-27 NOTE — Telephone Encounter (Signed)
Thanks.  I have sent a prescription to your pharmacy I'll see you next time.

## 2017-07-23 ENCOUNTER — Other Ambulatory Visit: Payer: Self-pay | Admitting: Physician Assistant

## 2017-07-23 ENCOUNTER — Ambulatory Visit: Payer: Self-pay | Admitting: Physician Assistant

## 2017-07-23 ENCOUNTER — Encounter: Payer: Self-pay | Admitting: Physician Assistant

## 2017-07-23 VITALS — BP 130/82 | HR 104 | Temp 98.4°F | Resp 16 | Ht 65.0 in | Wt 200.0 lb

## 2017-07-23 DIAGNOSIS — J329 Chronic sinusitis, unspecified: Secondary | ICD-10-CM

## 2017-07-23 DIAGNOSIS — B9789 Other viral agents as the cause of diseases classified elsewhere: Secondary | ICD-10-CM

## 2017-07-23 DIAGNOSIS — Z720 Tobacco use: Secondary | ICD-10-CM

## 2017-07-23 DIAGNOSIS — E1165 Type 2 diabetes mellitus with hyperglycemia: Secondary | ICD-10-CM

## 2017-07-23 MED ORDER — VARENICLINE TARTRATE 0.5 MG X 11 & 1 MG X 42 PO MISC: ORAL | 0 refills | Status: DC

## 2017-07-23 MED ORDER — OLMESARTAN MEDOXOMIL 5 MG PO TABS: 5.0000 mg | ORAL_TABLET | Freq: Every day | ORAL | 1 refills | Status: DC

## 2017-07-23 MED ORDER — HYDROCOD POLST-CPM POLST ER 10-8 MG/5ML PO SUER: 5.0000 mL | Freq: Two times a day (BID) | ORAL | 0 refills | Status: DC | PRN

## 2017-07-23 NOTE — Patient Instructions (Signed)
We are sorry that you are not feeling well.  Here is how we plan to help!  Please stop the lisinopril and start the benicar as directed. Follow-up in 2 weeks for reassessment of cough and BP.  I have provided you with a prescription for Chantix to start when you are ready.  Based on what you have shared with me it looks like you have sinusitis.  Sinusitis is inflammation and infection in the sinus cavities of the head.  Based on your presentation I believe you most likely have Acute Viral Sinusitis.This is an infection most likely caused by a virus. There is not specific treatment for viral sinusitis other than to help you with the symptoms until the infection runs its course.  You may use an oral decongestant such as Mucinex D or if you have glaucoma or high blood pressure use plain Mucinex. Saline nasal spray help and can safely be used as often as needed for congestion, I have prescribed: Fluticasone nasal spray two sprays in each nostril once a day  Some authorities believe that zinc sprays or the use of Echinacea may shorten the course of your symptoms.  Sinus infections are not as easily transmitted as other respiratory infection, however we still recommend that you avoid close contact with loved ones, especially the very young and elderly.  Remember to wash your hands thoroughly throughout the day as this is the number one way to prevent the spread of infection!  Home Care:  Only take medications as instructed by your medical team.  Complete the entire course of an antibiotic.  Do not take these medications with alcohol.  A steam or ultrasonic humidifier can help congestion.  You can place a towel over your head and breathe in the steam from hot water coming from a faucet.  Avoid close contacts especially the very young and the elderly.  Cover your mouth when you cough or sneeze.  Always remember to wash your hands.  Get Help Right Away If:  You develop worsening fever or sinus  pain.  You develop a severe head ache or visual changes.  Your symptoms persist after you have completed your treatment plan.  Make sure you  Understand these instructions.  Will watch your condition.  Will get help right away if you are not doing well or get worse.

## 2017-07-23 NOTE — Progress Notes (Signed)
Patient presents to clinic today c/o 5 days of nasal congestion, cough that is mostly dry but sometimes productive of clear sputum, fatigue and ear pressure/sinus pressure. Denies ear pain, tooth pain or facial pain. Denies recent travel or sick contact. Has noted a chronic cough over the past couple of months that has been dry. Denies heart burn, indigestion or globus sensation. Denies seasonal allergy symptoms. Is ready to quit smoking and would like to discuss options.   Past Medical History:  Diagnosis Date  . Chronic ear infection   . Diabetes mellitus type II, controlled ([place])   . Hyperlipidemia   . Pancreatitis, acute     Current Outpatient Medications on File Prior to Visit  Medication Sig Dispense Refill  . atorvastatin (LIPITOR) 40 MG tablet Take 1 tablet (40 mg total) by mouth daily. 30 tablet 5  . cyclobenzaprine (FLEXERIL) 10 MG tablet Take 1 tablet (10 mg total) by mouth at bedtime. 30 tablet 0  . fenofibrate 160 MG tablet Take 1 tablet (160 mg total) by mouth daily. 30 tablet 5  . glucose blood test strip Use as instructed 100 each 12  . insulin NPH Human (NOVOLIN N) 100 UNIT/ML injection Inject 0.3 mLs (30 Units total) into the skin every morning. And syringes 1/day 10 mL 11  . Insulin Pen Needle (1ST CHOICE PEN NEEDLES) 31G X 6 MM MISC For insulin injections as directed 100 each 0  . Lancets (FREESTYLE) lancets Use as instructed 100 each 12  . metFORMIN (GLUCOPHAGE) 500 MG tablet Take 2 tablet (1000 mg) in morning and 1 tablet (500 mg) in evening for 2 weeks, then increase to 2 tablet (1000 mg) twice daily 120 tablet 1  . Omega-3 Fatty Acids (FISH OIL) 1000 MG CAPS Take 4,000 mg by mouth.      No current facility-administered medications on file prior to visit.     Allergies  Allergen Reactions  . Novocain [Procaine] Nausea And Vomiting    Family History  Problem Relation Age of Onset  . Heart attack Father 54       Deceased  . Diabetes Father   . Diabetes  Mother   . Hypertension Mother        Living  . Alzheimer's disease Maternal Grandfather   . Healthy Brother   . Healthy Daughter   . Diabetes Paternal Uncle     Social History   Socioeconomic History  . Marital status: Married    Spouse name: None  . Number of children: None  . Years of education: None  . Highest education level: None  Social Needs  . Financial resource strain: None  . Food insecurity - worry: None  . Food insecurity - inability: None  . Transportation needs - medical: None  . Transportation needs - non-medical: None  Occupational History  . None  Tobacco Use  . Smoking status: Former Smoker    Last attempt to quit: 05/22/2015    Years since quitting: 2.1  . Smokeless tobacco: Never Used  Substance and Sexual Activity  . Alcohol use: No    Alcohol/week: 0.0 oz  . Drug use: No  . Sexual activity: None  Other Topics Concern  . None  Social History Narrative  . None    Review of Systems - See HPI.  All other ROS are negative.  BP 130/82   Pulse (!) 104   Temp 98.4 F (36.9 C) (Oral)   Resp 16   Ht _0  (1.651 m)  Wt 200 lb (90.7 kg)   SpO2 98%   BMI 33.28 kg/m   Physical Exam  Constitutional: She is oriented to person, place, and time and well-developed, well-nourished, and in no distress.  HENT:  Head: Normocephalic and atraumatic.  Right Ear: Tympanic membrane normal.  Left Ear: Tympanic membrane normal.  Nose: Mucosal edema and rhinorrhea present. Right sinus exhibits no maxillary sinus tenderness and no frontal sinus tenderness. Left sinus exhibits no maxillary sinus tenderness and no frontal sinus tenderness.  Mouth/Throat: Uvula is midline, oropharynx is clear and moist and mucous membranes are normal.  Eyes: Conjunctivae are normal.  Neck: Neck supple.  Cardiovascular: Normal rate, regular rhythm, normal heart sounds and intact distal pulses.  Pulmonary/Chest: Effort normal and breath sounds normal. No respiratory distress. She  has no wheezes. She has no rales. She exhibits no tenderness.  Lymphadenopathy:    She has no cervical adenopathy.  Neurological: She is alert and oriented to person, place, and time.  Skin: Skin is warm and dry. No rash noted.  Vitals reviewed.  Recent Results (from the past 2160 hour(s))  Comp Met (CMET)     Status: Abnormal   Collection Time: 05/08/17 10:33 AM  Result Value Ref Range   Sodium 131 (L) 135 - 145 mEq/L   Potassium 4.2 3.5 - 5.1 mEq/L   Chloride 95 (L) 96 - 112 mEq/L   CO2 27 19 - 32 mEq/L   Glucose, Bld 258 (H) 70 - 99 mg/dL   BUN 8 6 - 23 mg/dL   Creatinine, Ser 0.63 0.40 - 1.20 mg/dL   Total Bilirubin 0.5 0.2 - 1.2 mg/dL   Alkaline Phosphatase 87 39 - 117 U/L   AST 10 0 - 37 U/L   ALT 14 0 - 35 U/L   Total Protein 6.8 6.0 - 8.3 g/dL   Albumin 3.7 3.5 - 5.2 g/dL   Calcium 8.9 8.4 - 10.5 mg/dL   GFR 107.37 >60.00 mL/min  Hemoglobin A1c     Status: Abnormal   Collection Time: 05/08/17 10:33 AM  Result Value Ref Range   Hgb A1c MFr Bld 10.6 (H) 4.6 - 6.5 %    Comment: Glycemic Control Guidelines for People with Diabetes:Non Diabetic:  <6%Goal of Therapy: <7%Additional Action Suggested:  >8%     Assessment/Plan: 1. Uncontrolled type 2 diabetes mellitus with hyperglycemia ([facility]) Will dc lisinoril and start benicar due to potential acei cough. benicar to provide renovascular protection. Follow-up scheduled to recheck BP. - olmesartan (BENICAR) 5 MG tablet; Take 1 tablet (5 mg total) by mouth daily.  Dispense: 30 tablet; Refill: 1  2. Tobacco abuse Discussed options today. Patient would like to proceed with Chantix. Rx sent to pharmacy.  3. Viral sinusitis Increase fluids.  Rest.  Saline nasal spray.  Probiotic.  Mucinex as directed.  Humidifier in bedroom. Flonase as directed.  Call or return to clinic if symptoms are not improving.    [name], PA-C

## 2017-08-23 ENCOUNTER — Ambulatory Visit (INDEPENDENT_AMBULATORY_CARE_PROVIDER_SITE_OTHER): Payer: Self-pay | Admitting: Endocrinology

## 2017-08-23 VITALS — BP 146/88 | HR 92 | Wt 205.0 lb

## 2017-08-23 DIAGNOSIS — E1165 Type 2 diabetes mellitus with hyperglycemia: Secondary | ICD-10-CM

## 2017-08-23 LAB — POCT GLYCOSYLATED HEMOGLOBIN (HGB A1C): Hemoglobin A1C: 8.9

## 2017-08-23 MED ORDER — INSULIN NPH (HUMAN) (ISOPHANE) 100 UNIT/ML ~~LOC~~ SUSP: 40.0000 [IU] | SUBCUTANEOUS | 11 refills | Status: DC

## 2017-08-23 NOTE — Patient Instructions (Addendum)
check your blood sugar once a day.  vary the time of day when you check, between before the 3 meals, and at bedtime.  also check if you have symptoms of your blood sugar being too high or too low.  please keep a record of the readings and bring it to your next appointment here (or you can bring the meter itself).  You can write it on any piece of paper.  please call us sooner if your blood sugar goes below 70, or if you have a lot of readings over 200.   Please increase the NPH insulin to 40 units each morning, and: Please continue the same metformin. Please come back for a follow-up appointment in 2 months.

## 2017-08-23 NOTE — Progress Notes (Signed)
Subjective:    Patient ID: Teresa Parks, female    DOB: Sep 02, 1970, 47 y.o.   MRN: [id number]  HPI Pt returns for f/u of diabetes mellitus: DM type: Insulin-requiring type 2 Dx'ed: 4627 Complications: none Therapy: insulin since dx GDM: never DKA: never Severe hypoglycemia: never Pancreatitis: once, in 2015 Pancreatic imaging: normal on 2016 [place] Other: she does not have insurance; she has severe hypertriglyceridemia; she declines multiple daily injections Interval history: no cbg record, but states cbg's vary from 126-150.  pt states she feels well in general.   Past Medical History:  Diagnosis Date  . Chronic ear infection   . Diabetes mellitus type II, controlled ([place])   . Hyperlipidemia   . Pancreatitis, acute     Past Surgical History:  Procedure Laterality Date  . HERNIA REPAIR    . KNEE SURGERY    . WISDOM TOOTH EXTRACTION      Social History   Socioeconomic History  . Marital status: Married    Spouse name: Not on file  . Number of children: Not on file  . Years of education: Not on file  . Highest education level: Not on file  Social Needs  . Financial resource strain: Not on file  . Food insecurity - worry: Not on file  . Food insecurity - inability: Not on file  . Transportation needs - medical: Not on file  . Transportation needs - non-medical: Not on file  Occupational History  . Not on file  Tobacco Use  . Smoking status: Former Smoker    Last attempt to quit: 05/22/2015    Years since quitting: 2.2  . Smokeless tobacco: Never Used  Substance and Sexual Activity  . Alcohol use: No    Alcohol/week: 0.0 oz  . Drug use: No  . Sexual activity: Not on file  Other Topics Concern  . Not on file  Social History Narrative  . Not on file    Current Outpatient Medications on File Prior to Visit  Medication Sig Dispense Refill  . atorvastatin (LIPITOR) 40 MG tablet Take 1 tablet (40 mg total) by mouth daily. 30 tablet 5  .  chlorpheniramine-HYDROcodone (TUSSIONEX PENNKINETIC ER) 10-8 MG/5ML SUER Take 5 mLs by mouth every 12 (twelve) hours as needed for cough. 140 mL 0  . cyclobenzaprine (FLEXERIL) 10 MG tablet Take 1 tablet (10 mg total) by mouth at bedtime. 30 tablet 0  . fenofibrate 160 MG tablet Take 1 tablet (160 mg total) by mouth daily. 30 tablet 5  . glucose blood test strip Use as instructed 100 each 12  . Insulin Pen Needle (1ST CHOICE PEN NEEDLES) 31G X 6 MM MISC For insulin injections as directed 100 each 0  . Lancets (FREESTYLE) lancets Use as instructed 100 each 12  . metFORMIN (GLUCOPHAGE) 500 MG tablet Take 2 tablet (1000 mg) in morning and 1 tablet (500 mg) in evening for 2 weeks, then increase to 2 tablet (1000 mg) twice daily 120 tablet 1  . olmesartan (BENICAR) 5 MG tablet Take 1 tablet (5 mg total) by mouth daily. 30 tablet 1  . Omega-3 Fatty Acids (FISH OIL) 1000 MG CAPS Take 4,000 mg by mouth.     . varenicline (CHANTIX PAK) 0.5 MG X 11 & 1 MG X 42 tablet Take one 0.5 mg tablet by mouth once daily for 3 days, then increase to one 0.5 mg tablet twice daily for 4 days, then increase to one 1 mg tablet twice daily. 53 tablet  0   No current facility-administered medications on file prior to visit.     Allergies  Allergen Reactions  . Novocain [Procaine] Nausea And Vomiting    Family History  Problem Relation Age of Onset  . Heart attack Father 4       Deceased  . Diabetes Father   . Diabetes Mother   . Hypertension Mother        Living  . Alzheimer's disease Maternal Grandfather   . Healthy Brother   . Healthy Daughter   . Diabetes Paternal Uncle     BP (!) 146/88 (BP Location: Left Arm, Patient Position: Sitting, Cuff Size: Normal)   Pulse 92   Wt 205 lb (93 kg)   SpO2 94%   BMI 34.11 kg/m   Review of Systems She denies hypoglycemia.     Objective:   Physical Exam VITAL SIGNS:  See vs page GENERAL: no distress Pulses: dorsalis pedis intact bilat.   MSK: no deformity of  the feet CV: no leg edema Skin:  no ulcer on the feet.  normal color and temp on the feet. Neuro: sensation is intact to touch on the feet  A1c=8.9%     Assessment & Plan:  Insulin-requiring type 2 DM: she needs increased rx. Hypertriglyceridemia: we'll recheck when glycemic control is better.  Patient Instructions  check your blood sugar once a day.  vary the time of day when you check, between before the 3 meals, and at bedtime.  also check if you have symptoms of your blood sugar being too high or too low.  please keep a record of the readings and bring it to your next appointment here (or you can bring the meter itself).  You can write it on any piece of paper.  please call us sooner if your blood sugar goes below 70, or if you have a lot of readings over 200.   Please increase the NPH insulin to 40 units each morning, and: Please continue the same metformin. Please come back for a follow-up appointment in 2 months.

## 2017-09-11 ENCOUNTER — Other Ambulatory Visit: Payer: Self-pay | Admitting: Physician Assistant

## 2017-10-21 ENCOUNTER — Ambulatory Visit: Payer: Self-pay | Admitting: Endocrinology

## 2017-10-28 ENCOUNTER — Other Ambulatory Visit: Payer: Self-pay | Admitting: Physician Assistant

## 2017-10-28 ENCOUNTER — Encounter: Payer: Self-pay | Admitting: Endocrinology

## 2017-10-28 ENCOUNTER — Ambulatory Visit (INDEPENDENT_AMBULATORY_CARE_PROVIDER_SITE_OTHER): Payer: Self-pay | Admitting: Endocrinology

## 2017-10-28 VITALS — BP 136/84 | HR 94 | Temp 98.1°F | Ht 65.0 in | Wt 208.0 lb

## 2017-10-28 DIAGNOSIS — E1165 Type 2 diabetes mellitus with hyperglycemia: Secondary | ICD-10-CM

## 2017-10-28 LAB — POCT GLYCOSYLATED HEMOGLOBIN (HGB A1C): Hemoglobin A1C: 9.9

## 2017-10-28 MED ORDER — METFORMIN HCL ER 500 MG PO TB24: 2000.0000 mg | ORAL_TABLET | Freq: Every day | ORAL | 3 refills | Status: DC

## 2017-10-28 MED ORDER — INSULIN NPH (HUMAN) (ISOPHANE) 100 UNIT/ML ~~LOC~~ SUSP: 50.0000 [IU] | SUBCUTANEOUS | 11 refills | Status: DC

## 2017-10-28 NOTE — Patient Instructions (Addendum)
check your blood sugar once a day.  vary the time of day when you check, between before the 3 meals, and at bedtime.  also check if you have symptoms of your blood sugar being too high or too low.  please keep a record of the readings and bring it to your next appointment here (or you can bring the meter itself).  You can write it on any piece of paper.  please call us sooner if your blood sugar goes below 70, or if you have a lot of readings over 200.   Please increase the NPH insulin to 50 units each morning, and:  Please resume the metformin.  Please come back for a follow-up appointment in 3 months.

## 2017-10-28 NOTE — Progress Notes (Signed)
Subjective:    Patient ID: Teresa Parks, female    DOB: [birth year], 47 y.o.   MRN: [id number]  HPI Pt returns for f/u of diabetes mellitus: DM type: Insulin-requiring type 2 Dx'ed: 5465 Complications: none Therapy: insulin since dx GDM: never DKA: never Severe hypoglycemia: never Pancreatitis: once, in 2015 Pancreatic imaging: normal on 2016 [place] Other: she does not have insurance; she has severe hypertriglyceridemia; she declines multiple daily injections.  Interval history: no cbg record, but states cbg's vary from 130-200.  It is highest in the afternoon.  pt states she feels well in general.  She sometimes misses the metformin, but not the insulin.   Past Medical History:  Diagnosis Date  . Chronic ear infection   . Diabetes mellitus type II, controlled ([place])   . Hyperlipidemia   . Pancreatitis, acute     Past Surgical History:  Procedure Laterality Date  . HERNIA REPAIR    . KNEE SURGERY    . WISDOM TOOTH EXTRACTION      Social History   Socioeconomic History  . Marital status: Married    Spouse name: Not on file  . Number of children: Not on file  . Years of education: Not on file  . Highest education level: Not on file  Occupational History  . Not on file  Social Needs  . Financial resource strain: Not on file  . Food insecurity:    Worry: Not on file    Inability: Not on file  . Transportation needs:    Medical: Not on file    Non-medical: Not on file  Tobacco Use  . Smoking status: Former Smoker    Last attempt to quit: 05/22/2015    Years since quitting: 2.4  . Smokeless tobacco: Never Used  Substance and Sexual Activity  . Alcohol use: No    Alcohol/week: 0.0 oz  . Drug use: No  . Sexual activity: Not on file  Lifestyle  . Physical activity:    Days per week: Not on file    Minutes per session: Not on file  . Stress: Not on file  Relationships  . Social connections:    Talks on phone: Not on file    Gets together: Not on file    Attends  religious service: Not on file    Active member of club or organization: Not on file    Attends meetings of clubs or organizations: Not on file    Relationship status: Not on file  . Intimate partner violence:    Fear of current or ex partner: Not on file    Emotionally abused: Not on file    Physically abused: Not on file    Forced sexual activity: Not on file  Other Topics Concern  . Not on file  Social History Narrative  . Not on file    Current Outpatient Medications on File Prior to Visit  Medication Sig Dispense Refill  . glucose blood test strip Use as instructed 100 each 12  . Lancets (FREESTYLE) lancets Use as instructed 100 each 12  . Omega-3 Fatty Acids (FISH OIL) 1000 MG CAPS Take 4,000 mg by mouth.     . varenicline (CHANTIX PAK) 0.5 MG X 11 & 1 MG X 42 tablet Take one 0.5 mg tablet by mouth once daily for 3 days, then increase to one 0.5 mg tablet twice daily for 4 days, then increase to one 1 mg tablet twice daily. 53 tablet 0   No current  facility-administered medications on file prior to visit.     Allergies  Allergen Reactions  . Novocain [Procaine] Nausea And Vomiting    Family History  Problem Relation Age of Onset  . Heart attack Father 40       Deceased  . Diabetes Father   . Diabetes Mother   . Hypertension Mother        Living  . Alzheimer's disease Maternal Grandfather   . Healthy Brother   . Healthy Daughter   . Diabetes Paternal Uncle     BP 136/84 (BP Location: Left Arm, Patient Position: Sitting, Cuff Size: Normal)   Pulse 94   Temp 98.1 F (36.7 C) (Oral)   Ht 5\' 5"  (1.651 m)   Wt 208 lb (94.3 kg)   SpO2 98%   BMI 34.61 kg/m    Review of Systems She denies hypoglycemia.     Objective:   Physical Exam VITAL SIGNS:  See vs page GENERAL: no distress Pulses: dorsalis pedis intact bilat.   MSK: no deformity of the feet CV: no leg edema Skin:  no ulcer on the feet.  normal color and temp on the feet. Neuro: sensation is intact  to touch on the feet Ext: There is bilateral onychomycosis of the toenails.    A1c=9.9%     Assessment & Plan:  Insulin-requiring type 2 DM: worse.   Inconsistent med taking: resume metformin.    Patient Instructions  check your blood sugar once a day.  vary the time of day when you check, between before the 3 meals, and at bedtime.  also check if you have symptoms of your blood sugar being too high or too low.  please keep a record of the readings and bring it to your next appointment here (or you can bring the meter itself).  You can write it on any piece of paper.  please call us sooner if your blood sugar goes below 70, or if you have a lot of readings over 200.   Please increase the NPH insulin to 50 units each morning, and:  Please resume the metformin.  Please come back for a follow-up appointment in 3 months.

## 2017-10-28 NOTE — Telephone Encounter (Signed)
Last OV 07/23/2017, No future appointment scheduled  Last filled Benicar 5 mg 07/23/17, # 30 with 1 refill Atorvastatin 40 mg 08/21/16, #30 with 5 refills Fenofibrate 160 mg 08/21/16, #30 with 5 refills Flexeril 10 mg 09/11/17, #30 with 0 refills  I tried to fill the others and just sent the Flexeril but would not allow me to.

## 2018-01-31 ENCOUNTER — Encounter: Payer: Self-pay | Admitting: Endocrinology

## 2018-01-31 ENCOUNTER — Ambulatory Visit (INDEPENDENT_AMBULATORY_CARE_PROVIDER_SITE_OTHER): Payer: Self-pay | Admitting: Endocrinology

## 2018-01-31 VITALS — BP 158/90 | HR 94 | Wt 207.4 lb

## 2018-01-31 DIAGNOSIS — E1165 Type 2 diabetes mellitus with hyperglycemia: Secondary | ICD-10-CM

## 2018-01-31 LAB — POCT GLYCOSYLATED HEMOGLOBIN (HGB A1C): Hemoglobin A1C: 8.4 % — AB (ref 4.0–5.6)

## 2018-01-31 MED ORDER — INSULIN NPH (HUMAN) (ISOPHANE) 100 UNIT/ML ~~LOC~~ SUSP: 60.0000 [IU] | SUBCUTANEOUS | 11 refills | Status: DC

## 2018-01-31 NOTE — Progress Notes (Signed)
Subjective:    Patient ID: Teresa Parks, female    DOB: 1970/09/04, 47 y.o.   MRN: [id number]  HPI Pt returns for f/u of diabetes mellitus: DM type: Insulin-requiring type 2 Dx'ed: 1194 Complications: none Therapy: insulin since dx, and metformin GDM: never DKA: never Severe hypoglycemia: never Pancreatitis: once, in 2015 Pancreatic imaging: normal on 2016 [place] Other: she does not have insurance; she has severe hypertriglyceridemia; she declines multiple daily injections.   Interval history: no cbg record, but states cbg's vary from 130-160. There is no trend throughout the day. pt states she feels well in general.  She never misses the insulin.  Past Medical History:  Diagnosis Date  . Chronic ear infection   . Diabetes mellitus type II, controlled ([place])   . Hyperlipidemia   . Pancreatitis, acute     Past Surgical History:  Procedure Laterality Date  . HERNIA REPAIR    . KNEE SURGERY    . WISDOM TOOTH EXTRACTION      Social History   Socioeconomic History  . Marital status: Married    Spouse name: Not on file  . Number of children: Not on file  . Years of education: Not on file  . Highest education level: Not on file  Occupational History  . Not on file  Social Needs  . Financial resource strain: Not on file  . Food insecurity:    Worry: Not on file    Inability: Not on file  . Transportation needs:    Medical: Not on file    Non-medical: Not on file  Tobacco Use  . Smoking status: Former Smoker    Last attempt to quit: 05/22/2015    Years since quitting: 2.7  . Smokeless tobacco: Never Used  Substance and Sexual Activity  . Alcohol use: No    Alcohol/week: 0.0 oz  . Drug use: No  . Sexual activity: Not on file  Lifestyle  . Physical activity:    Days per week: Not on file    Minutes per session: Not on file  . Stress: Not on file  Relationships  . Social connections:    Talks on phone: Not on file    Gets together: Not on file    Attends  religious service: Not on file    Active member of club or organization: Not on file    Attends meetings of clubs or organizations: Not on file    Relationship status: Not on file  . Intimate partner violence:    Fear of current or ex partner: Not on file    Emotionally abused: Not on file    Physically abused: Not on file    Forced sexual activity: Not on file  Other Topics Concern  . Not on file  Social History Narrative  . Not on file    Current Outpatient Medications on File Prior to Visit  Medication Sig Dispense Refill  . atorvastatin (LIPITOR) 40 MG tablet TAKE ONE TABLET BY MOUTH ONCE DAILY 30 tablet 5  . cyclobenzaprine (FLEXERIL) 10 MG tablet TAKE 1 TABLET BY MOUTH ONCE DAILY AT BEDTIME 30 tablet 0  . fenofibrate 160 MG tablet TAKE ONE TABLET BY MOUTH ONCE DAILY 30 tablet 5  . glucose blood test strip Use as instructed 100 each 12  . Lancets (FREESTYLE) lancets Use as instructed 100 each 12  . metFORMIN (GLUCOPHAGE-XR) 500 MG 24 hr tablet Take 4 tablets (2,000 mg total) by mouth daily. 360 tablet 3  . olmesartan (  BENICAR) 5 MG tablet TAKE 1 TABLET BY MOUTH ONCE DAILY 90 tablet 0  . Omega-3 Fatty Acids (FISH OIL) 1000 MG CAPS Take 4,000 mg by mouth.     . varenicline (CHANTIX PAK) 0.5 MG X 11 & 1 MG X 42 tablet Take one 0.5 mg tablet by mouth once daily for 3 days, then increase to one 0.5 mg tablet twice daily for 4 days, then increase to one 1 mg tablet twice daily. 53 tablet 0   No current facility-administered medications on file prior to visit.     Allergies  Allergen Reactions  . Novocain [Procaine] Nausea And Vomiting    Family History  Problem Relation Age of Onset  . Heart attack Father 75       Deceased  . Diabetes Father   . Diabetes Mother   . Hypertension Mother        Living  . Alzheimer's disease Maternal Grandfather   . Healthy Brother   . Healthy Daughter   . Diabetes Paternal Uncle     BP (!) 158/90 (BP Location: Left Arm, Patient Position:  Sitting, Cuff Size: Normal)   Pulse 94   Wt 207 lb 6.4 oz (94.1 kg)   SpO2 95%   BMI 34.51 kg/m    Review of Systems She denies hypoglycemia.      Objective:   Physical Exam VITAL SIGNS:  See vs page GENERAL: no distress Pulses: foot pulses are intact bilaterally.   MSK: no deformity of the feet or ankles.  CV: no edema of the legs or ankles Skin:  no ulcer on the feet or ankles.  normal color and temp on the feet and ankles Neuro: sensation is intact to touch on the feet and ankles.   Ext: There is bilateral onychomycosis of the toenails.     Lab Results  Component Value Date   HGBA1C 8.4 (A) 01/31/2018       Assessment & Plan:  HTN: is noted today.  Insulin-requiring type 2 DM: she needs increased rx.    Patient Instructions  Your blood pressure is high today.  Please see your primary care provider soon, to have it rechecked check your blood sugar once a day.  vary the time of day when you check, between before the 3 meals, and at bedtime.  also check if you have symptoms of your blood sugar being too high or too low.  please keep a record of the readings and bring it to your next appointment here (or you can bring the meter itself).  You can write it on any piece of paper.  please call us sooner if your blood sugar goes below 70, or if you have a lot of readings over 200.   Please increase the NPH insulin to 60 units each morning, and:  Please continue the same metformin.  Please come back for a follow-up appointment in 3 months.

## 2018-01-31 NOTE — Patient Instructions (Addendum)
Your blood pressure is high today.  Please see your primary care provider soon, to have it rechecked check your blood sugar once a day.  vary the time of day when you check, between before the 3 meals, and at bedtime.  also check if you have symptoms of your blood sugar being too high or too low.  please keep a record of the readings and bring it to your next appointment here (or you can bring the meter itself).  You can write it on any piece of paper.  please call us sooner if your blood sugar goes below 70, or if you have a lot of readings over 200.   Please increase the NPH insulin to 60 units each morning, and:  Please continue the same metformin.  Please come back for a follow-up appointment in 3 months.

## 2018-03-14 ENCOUNTER — Ambulatory Visit: Payer: Self-pay | Admitting: Physician Assistant

## 2018-03-14 ENCOUNTER — Encounter: Payer: Self-pay | Admitting: Physician Assistant

## 2018-03-14 ENCOUNTER — Other Ambulatory Visit: Payer: Self-pay

## 2018-03-14 VITALS — BP 140/92 | HR 102 | Temp 98.2°F | Resp 16 | Ht 65.0 in | Wt 208.8 lb

## 2018-03-14 DIAGNOSIS — E1159 Type 2 diabetes mellitus with other circulatory complications: Secondary | ICD-10-CM

## 2018-03-14 DIAGNOSIS — H66001 Acute suppurative otitis media without spontaneous rupture of ear drum, right ear: Secondary | ICD-10-CM

## 2018-03-14 DIAGNOSIS — R202 Paresthesia of skin: Secondary | ICD-10-CM

## 2018-03-14 DIAGNOSIS — I10 Essential (primary) hypertension: Secondary | ICD-10-CM

## 2018-03-14 MED ORDER — AMOXICILLIN 875 MG PO TABS: 875.0000 mg | ORAL_TABLET | Freq: Two times a day (BID) | ORAL | 0 refills | Status: DC

## 2018-03-14 NOTE — Patient Instructions (Signed)
Please take antibiotic as directed.  Increase fluids and get plenty of rest.  Start Nasacort OTC to help with fluid behind the ears.   For the notalgia paresthetica, apply Capsacin to the area. It will likely cause a burning sensation at first but will then dull the overactive nerves causing itch.   You can also use over the counter lidocaine to just numb the area instead.

## 2018-03-14 NOTE — Progress Notes (Signed)
Patient presents to clinic today c/o R ear pain and pressure with R-sided sore throat over the pat 2 days following a couple weeks of nasal congestion, PND and ear pressure. Denies fever, chills, malaise or fatigue. Denies sick contact.   Past Medical History:  Diagnosis Date  . Chronic ear infection   . Diabetes mellitus type II, controlled ([place])   . Hyperlipidemia   . Pancreatitis, acute     Current Outpatient Medications on File Prior to Visit  Medication Sig Dispense Refill  . atorvastatin (LIPITOR) 40 MG tablet TAKE ONE TABLET BY MOUTH ONCE DAILY 30 tablet 5  . cyclobenzaprine (FLEXERIL) 10 MG tablet TAKE 1 TABLET BY MOUTH ONCE DAILY AT BEDTIME 30 tablet 0  . fenofibrate 160 MG tablet TAKE ONE TABLET BY MOUTH ONCE DAILY 30 tablet 5  . glucose blood test strip Use as instructed 100 each 12  . insulin NPH Human (NOVOLIN N) 100 UNIT/ML injection Inject 0.6 mLs (60 Units total) into the skin every morning. And syringes 1/day 20 mL 11  . Lancets (FREESTYLE) lancets Use as instructed 100 each 12  . metFORMIN (GLUCOPHAGE-XR) 500 MG 24 hr tablet Take 4 tablets (2,000 mg total) by mouth daily. 360 tablet 3  . olmesartan (BENICAR) 5 MG tablet TAKE 1 TABLET BY MOUTH ONCE DAILY 90 tablet 0  . Omega-3 Fatty Acids (FISH OIL) 1000 MG CAPS Take 4,000 mg by mouth.     . varenicline (CHANTIX PAK) 0.5 MG X 11 & 1 MG X 42 tablet Take one 0.5 mg tablet by mouth once daily for 3 days, then increase to one 0.5 mg tablet twice daily for 4 days, then increase to one 1 mg tablet twice daily. (Patient not taking: Reported on 03/14/2018) 53 tablet 0   No current facility-administered medications on file prior to visit.     Allergies  Allergen Reactions  . Novocain [Procaine] Nausea And Vomiting    Family History  Problem Relation Age of Onset  . Heart attack Father 7       Deceased  . Diabetes Father   . Diabetes Mother   . Hypertension Mother        Living  . Alzheimer's disease Maternal  Grandfather   . Healthy Brother   . Healthy Daughter   . Diabetes Paternal Uncle     Social History   Socioeconomic History  . Marital status: Married    Spouse name: Not on file  . Number of children: Not on file  . Years of education: Not on file  . Highest education level: Not on file  Occupational History  . Not on file  Social Needs  . Financial resource strain: Not on file  . Food insecurity:    Worry: Not on file    Inability: Not on file  . Transportation needs:    Medical: Not on file    Non-medical: Not on file  Tobacco Use  . Smoking status: Former Smoker    Last attempt to quit: 05/22/2015    Years since quitting: 2.8  . Smokeless tobacco: Never Used  Substance and Sexual Activity  . Alcohol use: No    Alcohol/week: 0.0 standard drinks  . Drug use: No  . Sexual activity: Not on file  Lifestyle  . Physical activity:    Days per week: Not on file    Minutes per session: Not on file  . Stress: Not on file  Relationships  . Social connections:    Talks  on phone: Not on file    Gets together: Not on file    Attends religious service: Not on file    Active member of club or organization: Not on file    Attends meetings of clubs or organizations: Not on file    Relationship status: Not on file  Other Topics Concern  . Not on file  Social History Narrative  . Not on file   Review of Systems - See HPI.  All other ROS are negative.  BP (!) 140/92   Pulse (!) 102   Temp 98.2 F (36.8 C) (Oral)   Resp 16   Ht 5\' 5"  (1.651 m)   Wt 208 lb 12.8 oz (94.7 kg)   SpO2 96%   BMI 34.75 kg/m   Physical Exam  Constitutional: She is oriented to person, place, and time. She appears well-developed and well-nourished.  HENT:  Head: Normocephalic and atraumatic.  Right Ear: Ear canal normal. Tympanic membrane is erythematous and bulging. A middle ear effusion is present.  Left Ear: Tympanic membrane and ear canal normal.  Mouth/Throat: Oropharynx is clear and  moist and mucous membranes are normal. No oral lesions. Tonsils are 0 on the right. Tonsils are 0 on the left.  Eyes: Pupils are equal, round, and reactive to light.  Neck: Neck supple.  Cardiovascular: Normal rate and normal heart sounds.  Pulmonary/Chest: Effort normal.  Lymphadenopathy:    She has no cervical adenopathy.  Neurological: She is alert and oriented to person, place, and time.  Psychiatric: She has a normal mood and affect.  Vitals reviewed.   Recent Results (from the past 2160 hour(s))  POCT HgB A1C     Status: Abnormal   Collection Time: 01/31/18  9:12 AM  Result Value Ref Range   Hemoglobin A1C 8.4 (A) 4.0 - 5.6 %   HbA1c POC (<> result, manual entry)  4.0 - 5.6 %   HbA1c, POC (prediabetic range)  5.7 - 6.4 %   HbA1c, POC (controlled diabetic range)  0.0 - 7.0 %    Assessment/Plan: 1. Notalgia paresthetica Noted at the end of visit. Itching spot of lower R shoulder blade without lesion. Exam negative. Supportive measures and OTC medications discussed. Reassurance given. She is following with her Dermatologist.  2. Non-recurrent acute suppurative otitis media of right ear without spontaneous rupture of tympanic membrane Start ABX. Supportive measures and OTC medications reviewed. - amoxicillin (AMOXIL) 875 MG tablet; Take 1 tablet (875 mg total) by mouth 2 (two) times daily.  Dispense: 20 tablet; Refill: 0  3. Hypertension associated with diabetes ([place]) Above goal today. Has not taken medications today. DASH diet discussed. Resume medications. Follow-up 2 weeks for CPE.   [name], PA-C

## 2018-04-01 ENCOUNTER — Encounter: Payer: Self-pay | Admitting: Physician Assistant

## 2018-04-02 ENCOUNTER — Encounter: Payer: Self-pay | Admitting: Physician Assistant

## 2018-04-02 ENCOUNTER — Ambulatory Visit: Payer: Self-pay | Admitting: Physician Assistant

## 2018-04-02 ENCOUNTER — Other Ambulatory Visit: Payer: Self-pay

## 2018-04-02 VITALS — BP 130/72 | HR 109 | Temp 97.9°F | Resp 14 | Ht 65.0 in | Wt 197.0 lb

## 2018-04-02 DIAGNOSIS — K859 Acute pancreatitis without necrosis or infection, unspecified: Secondary | ICD-10-CM

## 2018-04-02 DIAGNOSIS — E781 Pure hyperglyceridemia: Secondary | ICD-10-CM

## 2018-04-02 LAB — CBC WITH DIFFERENTIAL/PLATELET
BASOS PCT: 0.8 % (ref 0.0–3.0)
Basophils Absolute: 0.1 10*3/uL (ref 0.0–0.1)
EOS PCT: 2.5 % (ref 0.0–5.0)
Eosinophils Absolute: 0.3 10*3/uL (ref 0.0–0.7)
HCT: 43.4 % (ref 36.0–46.0)
Hemoglobin: 14.5 g/dL (ref 12.0–15.0)
LYMPHS ABS: 1.5 10*3/uL (ref 0.7–4.0)
Lymphocytes Relative: 14.5 % (ref 12.0–46.0)
MCHC: 33.3 g/dL (ref 30.0–36.0)
MCV: 86.9 fl (ref 78.0–100.0)
MONOS PCT: 5.5 % (ref 3.0–12.0)
Monocytes Absolute: 0.6 10*3/uL (ref 0.1–1.0)
NEUTROS PCT: 76.7 % (ref 43.0–77.0)
Neutro Abs: 7.9 10*3/uL — ABNORMAL HIGH (ref 1.4–7.7)
PLATELETS: 452 10*3/uL — AB (ref 150.0–400.0)
RBC: 5 Mil/uL (ref 3.87–5.11)
RDW: 17 % — AB (ref 11.5–15.5)
WBC: 10.2 10*3/uL (ref 4.0–10.5)

## 2018-04-02 LAB — COMPREHENSIVE METABOLIC PANEL
ALK PHOS: 101 U/L (ref 39–117)
ALT: 11 U/L (ref 0–35)
AST: 14 U/L (ref 0–37)
Albumin: 3.8 g/dL (ref 3.5–5.2)
BUN: 14 mg/dL (ref 6–23)
CHLORIDE: 98 meq/L (ref 96–112)
CO2: 28 meq/L (ref 19–32)
Calcium: 9.5 mg/dL (ref 8.4–10.5)
Creatinine, Ser: 0.67 mg/dL (ref 0.40–1.20)
GFR: 99.62 mL/min (ref >60.00)
GLUCOSE: 176 mg/dL — AB (ref 70–99)
POTASSIUM: 3.9 meq/L (ref 3.5–5.1)
Sodium: 135 mEq/L (ref 135–145)
TOTAL PROTEIN: 7.5 g/dL (ref 6.0–8.3)
Total Bilirubin: 0.7 mg/dL (ref 0.2–1.2)

## 2018-04-02 LAB — LIPID PANEL
Cholesterol: 303 mg/dL — ABNORMAL HIGH (ref 0–200)
HDL: 33 mg/dL — AB (ref >39.00)
Total CHOL/HDL Ratio: 9

## 2018-04-02 LAB — LIPASE: Lipase: 63 U/L — ABNORMAL HIGH (ref 11.0–59.0)

## 2018-04-02 LAB — LDL CHOLESTEROL, DIRECT: Direct LDL: 82 mg/dL

## 2018-04-02 MED ORDER — HYDROCODONE-ACETAMINOPHEN 10-325 MG PO TABS: 1.0000 | ORAL_TABLET | Freq: Three times a day (TID) | ORAL | 0 refills | Status: DC | PRN

## 2018-04-02 MED ORDER — ICOSAPENT ETHYL 1 G PO CAPS: 2.0000 g | ORAL_CAPSULE | Freq: Two times a day (BID) | ORAL | 1 refills | Status: DC

## 2018-04-02 NOTE — Patient Instructions (Signed)
Please go to the lab today for blood work.  I will call you with your results. We will alter treatment regimen(s) if indicated by your results.   Please use the pain medication as directed when needed for moderate to severe pain. Keep a clear liquid diet until pain resolved. Stop the OTC fish oil. Start the Vascepa as directed in addition to other medications. Follow-up with your Endocrinologist as we need better control of your diabetes.   ER for any worsening or non-improving symptoms.   I will be sending you back to the lipid clinic for further management due to your treatment-resistant hypertriglyceridemia.

## 2018-04-02 NOTE — Progress Notes (Signed)
Patient with history of DM II (A1C 8.4), hyperlipidemia and hypertriglyceridemia with prior episodes of acute pancreatitis presents to clinic today c/o 3 days of epigastric pain, initially with several bouts of non-bloody emesis and fatigue. Notes this has stopped but the pain is still present. About a 6-7/10. Notes she immediately stopped solid foods and has kept a clear liquid diet which has been very helpful. Notes normal bowel movement. States this feels exactly like her prior bouts of TGL induced pancreatitis. Is followed by both Endo and Lipid Clinic. Has not had her Lipids checked in > 1 year. Currently on a regimen of Atorvastatin, Fenofibrate and OTC fish oils. Does not drink. Tries to keep a diet low in fats and sugars. .   Past Medical History:  Diagnosis Date  . Chronic ear infection   . Diabetes mellitus type II, controlled ([place])   . Hyperlipidemia   . Pancreatitis, acute     Current Outpatient Medications on File Prior to Visit  Medication Sig Dispense Refill  . atorvastatin (LIPITOR) 40 MG tablet TAKE ONE TABLET BY MOUTH ONCE DAILY 30 tablet 5  . cyclobenzaprine (FLEXERIL) 10 MG tablet TAKE 1 TABLET BY MOUTH ONCE DAILY AT BEDTIME 30 tablet 0  . fenofibrate 160 MG tablet TAKE ONE TABLET BY MOUTH ONCE DAILY 30 tablet 5  . glucose blood test strip Use as instructed 100 each 12  . insulin NPH Human (NOVOLIN N) 100 UNIT/ML injection Inject 0.6 mLs (60 Units total) into the skin every morning. And syringes 1/day 20 mL 11  . Lancets (FREESTYLE) lancets Use as instructed 100 each 12  . metFORMIN (GLUCOPHAGE-XR) 500 MG 24 hr tablet Take 4 tablets (2,000 mg total) by mouth daily. 360 tablet 3  . olmesartan (BENICAR) 5 MG tablet TAKE 1 TABLET BY MOUTH ONCE DAILY 90 tablet 0  . Omega-3 Fatty Acids (FISH OIL) 1000 MG CAPS Take 4,000 mg by mouth.      No current facility-administered medications on file prior to visit.     Allergies  Allergen Reactions  . Novocain [Procaine] Nausea  And Vomiting    Family History  Problem Relation Age of Onset  . Heart attack Father 58       Deceased  . Diabetes Father   . Diabetes Mother   . Hypertension Mother        Living  . Alzheimer's disease Maternal Grandfather   . Healthy Brother   . Healthy Daughter   . Diabetes Paternal Uncle     Social History   Socioeconomic History  . Marital status: Married    Spouse name: Not on file  . Number of children: Not on file  . Years of education: Not on file  . Highest education level: Not on file  Occupational History  . Not on file  Social Needs  . Financial resource strain: Not on file  . Food insecurity:    Worry: Not on file    Inability: Not on file  . Transportation needs:    Medical: Not on file    Non-medical: Not on file  Tobacco Use  . Smoking status: Former Smoker    Last attempt to quit: 05/22/2015    Years since quitting: 2.8  . Smokeless tobacco: Never Used  Substance and Sexual Activity  . Alcohol use: No    Alcohol/week: 0.0 standard drinks  . Drug use: No  . Sexual activity: Not on file  Lifestyle  . Physical activity:    Days per  week: Not on file    Minutes per session: Not on file  . Stress: Not on file  Relationships  . Social connections:    Talks on phone: Not on file    Gets together: Not on file    Attends religious service: Not on file    Active member of club or organization: Not on file    Attends meetings of clubs or organizations: Not on file    Relationship status: Not on file  Other Topics Concern  . Not on file  Social History Narrative  . Not on file   Review of Systems - See HPI.  All other ROS are negative.  BP 130/72   Pulse (!) 109   Temp 97.9 F (36.6 C) (Oral)   Resp 14   Ht 5' 5"  (1.651 m)   Wt 197 lb (89.4 kg)   SpO2 98%   BMI 32.78 kg/m   Physical Exam  Constitutional: She is oriented to person, place, and time. She appears well-developed and well-nourished.  HENT:  Head: Normocephalic and  atraumatic.  Cardiovascular: Normal rate, regular rhythm and normal heart sounds.  Pulmonary/Chest: Effort normal and breath sounds normal.  Abdominal: Bowel sounds are normal. There is tenderness in the epigastric area.  Neurological: She is alert and oriented to person, place, and time.  Vitals reviewed.  Recent Results (from the past 2160 hour(s))  POCT HgB A1C     Status: Abnormal   Collection Time: 01/31/18  9:12 AM  Result Value Ref Range   Hemoglobin A1C 8.4 (A) 4.0 - 5.6 %   HbA1c POC (<> result, manual entry)  4.0 - 5.6 %   HbA1c, POC (prediabetic range)  5.7 - 6.4 %   HbA1c, POC (controlled diabetic range)  0.0 - 7.0 %   Assessment/Plan: 1. Hypertriglyceridemia Uncontrolled. Will repeat Lipid panel. Continue Atorvastatin and Fenofibrate. Stop OTC fish oil. Start Vascepa 2 g BID. Patient to schedule follow-up with Lipid clinic. Will place new referral for insurance purposes.  - Lipid Profile  2. Acute recurrent pancreatitis 2/2 TGL levels. See treatment changes for TGL as noted above. Improving but still causing pain. Will check CBCD, CMP, Lipase. Start Hydrocodone for more severe pain. Keep clear liquid diet at this point. ER for any worsening or non-improving symptoms.   - CBC w/Diff - Comp Met (CMET) - Lipase - HYDROcodone-acetaminophen (NORCO) 10-325 MG tablet; Take 1 tablet by mouth every 8 (eight) hours as needed.  Dispense: 15 tablet; Refill: 0   [name], PA-C

## 2018-04-15 ENCOUNTER — Ambulatory Visit: Payer: Self-pay | Admitting: Physician Assistant

## 2018-04-15 ENCOUNTER — Encounter: Payer: Self-pay | Admitting: Physician Assistant

## 2018-04-15 VITALS — BP 150/88 | HR 92 | Ht 64.0 in | Wt 203.2 lb

## 2018-04-15 DIAGNOSIS — K859 Acute pancreatitis without necrosis or infection, unspecified: Secondary | ICD-10-CM

## 2018-04-15 NOTE — Patient Instructions (Addendum)
Please have lab work done at the end of November. Nothing to eat or drink after midnight prior to having these labs done.   You have been scheduled for an MRI at [facility] on 04-24-18. Your appointment time is 9 am. Please arrive 30 minutes prior to your appointment time for registration purposes. Please make certain not to have anything to eat or drink 6 hours prior to your test. In addition, if you have any metal in your body, have a pacemaker or defibrillator, please be sure to let your ordering physician know. This test typically takes 45 minutes to 1 hour to complete. Should you need to reschedule, please call [phone] to do so.  We have given you a low fat diet.

## 2018-04-15 NOTE — Progress Notes (Signed)
Thank you for sending this case to me. I have reviewed the entire note, and the outlined plan seems appropriate.  The MRCP is reasonable to rule out pancreatic duct structural abnormalities and stricture.  However, I agree this seems most likely to be happening from persistent dyslipidemia.  Wilfrid Lund, MD

## 2018-04-15 NOTE — Progress Notes (Signed)
Subjective:    Patient ID: Teresa Parks, female    DOB: 06-17-1971, 47 y.o.   MRN: [id number]  HPI Teresa Parks is a pleasant 47 year old white female, new to GI today referred by [name], PA-C for evaluation of recurrent pancreatitis. Patient has not had any prior GI evaluation.  She states she is status post umbilical hernia repair, she has history of adult onset diabetes mellitus, insulin-dependent, obesity, hyperlipidemia and prior history of Bell's palsy. Patient states that she initially had an episode of pancreatitis in July 2015 and was hospitalized here in [place] for a few days.  She recalls being told that her triglycerides were elevated at that time and also her diabetes was out of control.  She had a second episode in December 2015.  She says she said 1 or 2 episodes of pancreatitis every year since it has only been hospitalized twice. Review of imaging shows that she has only had ultrasounds done which have not shown any gallstones and common bile duct has been normal size. She had a fairly recent episode with onset a couple of weeks ago.  She says that generally when she starts having pain nausea and vomiting she knows pancreatitis is flaring up and she will start on Gatorade and sips of liquids.  When she is able to keep clear liquids down she will gradually advance her diet over the next 4 to 5 days.  She has on occasion needed some pain medication.  This last episode was similar however when she advanced to solid food then she had recurrent pain nausea and vomiting had to repeat the process over the next for 5 days.  She says she is better now still has some mild epigastric tenderness. She does not drink any EtOH, no drug use, as this last episode she is been concentrating very hard on her diet.  She has given up [facility], she has significantly decreased sugar and fat intake now has her blood sugars in the 150 range. Was done 04/02/2018 showed triglyceride of 807  cholesterol 303 lipase of 63 liver test within normal limits glucose was 176 and CBC was normal.  Reviewing her labs in February 2018 triglycerides were 1721 and in 2017 triglycerides were 2745. She takes Lipitor 40 mg once daily. She was given a prescription at one point for Vesepa but she has no insurance and the prescription was going to be about $500.   She does mention that she has a consultation with endocrinology/Dr. Loanne Drilling in a couple of weeks.  Review of Systems Pertinent positive and negative review of systems were noted in the above HPI section.  All other review of systems was otherwise negative.  Outpatient Encounter Medications as of 04/15/2018  Medication Sig  . atorvastatin (LIPITOR) 40 MG tablet TAKE ONE TABLET BY MOUTH ONCE DAILY  . cyclobenzaprine (FLEXERIL) 10 MG tablet TAKE 1 TABLET BY MOUTH ONCE DAILY AT BEDTIME  . fenofibrate 160 MG tablet TAKE ONE TABLET BY MOUTH ONCE DAILY  . glucose blood test strip Use as instructed  . HYDROcodone-acetaminophen (NORCO) 10-325 MG tablet Take 1 tablet by mouth every 8 (eight) hours as needed.  . insulin NPH Human (NOVOLIN N) 100 UNIT/ML injection Inject 0.6 mLs (60 Units total) into the skin every morning. And syringes 1/day  . Lancets (FREESTYLE) lancets Use as instructed  . metFORMIN (GLUCOPHAGE-XR) 500 MG 24 hr tablet Take 4 tablets (2,000 mg total) by mouth daily.  Marland Kitchen olmesartan (BENICAR) 5 MG tablet TAKE 1  TABLET BY MOUTH ONCE DAILY  . [DISCONTINUED] Icosapent Ethyl (VASCEPA) 1 g CAPS Take 2 capsules (2 g total) by mouth 2 (two) times daily.  . [DISCONTINUED] Omega-3 Fatty Acids (FISH OIL) 1000 MG CAPS Take 4,000 mg by mouth.    No facility-administered encounter medications on file as of 04/15/2018.    Allergies  Allergen Reactions  . Novocain [Procaine] Nausea And Vomiting   Patient Active Problem List   Diagnosis Date Noted  . Apneic episode 01/13/2017  . Sacroiliac pain 08/19/2016  . Hx of pancreatitis 07/05/2015  .  Hyperlipidemia associated with type 2 diabetes mellitus ([place]) 05/10/2014  . Obesity 05/10/2014  . Type II diabetes mellitus, uncontrolled ([place]) 03/15/2014  . Hypertriglyceridemia 03/15/2014  . Tobacco abuse 02/06/2014  . OA (osteoarthritis) of knee 02/06/2014   Social History   Socioeconomic History  . Marital status: Married    Spouse name: Not on file  . Number of children: 1  . Years of education: Not on file  . Highest education level: Not on file  Occupational History  . Occupation: Chief Financial Officer  Social Needs  . Financial resource strain: Not on file  . Food insecurity:    Worry: Not on file    Inability: Not on file  . Transportation needs:    Medical: Not on file    Non-medical: Not on file  Tobacco Use  . Smoking status: Current Every Day Smoker    Types: Cigarettes  . Smokeless tobacco: Never Used  Substance and Sexual Activity  . Alcohol use: No    Alcohol/week: 0.0 standard drinks  . Drug use: No  . Sexual activity: Not on file  Lifestyle  . Physical activity:    Days per week: Not on file    Minutes per session: Not on file  . Stress: Not on file  Relationships  . Social connections:    Talks on phone: Not on file    Gets together: Not on file    Attends religious service: Not on file    Active member of club or organization: Not on file    Attends meetings of clubs or organizations: Not on file    Relationship status: Not on file  . Intimate partner violence:    Fear of current or ex partner: Not on file    Emotionally abused: Not on file    Physically abused: Not on file    Forced sexual activity: Not on file  Other Topics Concern  . Not on file  Social History Narrative  . Not on file    Teresa Parks's family history includes Alzheimer's disease in her maternal grandfather; Diabetes in her father, mother, and paternal uncle; Healthy in her brother and daughter; Heart attack (age of onset: 24) in her father; Hypertension in her  mother.      Objective:    Vitals:   04/15/18 0930  BP: (!) 150/88  Pulse: 92    Physical Exam; well-developed white female in no acute distress, pleasant blood pressure 150/88 pulse 92, height 5 foot 4, weight 203, BMI of 34.8.  HEENT; nontraumatic normocephalic EOMI PERRLA sclera anicteric buccal mucosa moist, Cardiovascular ;regular rate and rhythm with S1-S2 no murmur rub or gallop, Pulmonary ;clear bilaterally, Abdomen; soft, she has mild tenderness across the epigastrium there is no guarding or rebound no palpable mass or hepatosplenomegaly bowel sounds are present, Rectal ;exam not done, Extremities; no clubbing cyanosis or edema skin warm dry,Neuro psych ;alert and oriented, grossly nonfocal mood and  affect appropriate       Assessment & Plan:   #58 47 year old white female with recurrent pancreatitis Initial episode in mid 2015 the patient has had 2 prior hospitalizations and says she gets at least 1-2 episodes pancreatitis each year.  She has just gotten over an episode that lasted for about 2 weeks. Previous ultrasounds have not shown any gallstones or dilated common bile duct.  She has not had any other imaging. She does have history of significant hypertriglyceridemia which is very likely the etiology of her pancreatitis, though other causes should be ruled out.  #2 history of hyperlipidemia poorly controlled   #3 adult onset diabetes mellitus insulin-dependent #4.  History of Bell's palsy  Plan;  Long discussion with patient today regarding recurrent pancreatitis and association with hypertriglyceridemia.  We discussed importance of maintaining a very low-fat diet, avoiding alcohol, weight loss and appropriate medication therapy for hypertriglyceridemia. I think she needs to be on another statin/medications specifically for hypertriglyceridemia.  Consider Fenofibrate  - however cost may be the issue as she has no insurance. We discussed increasing dose of Lipitor but she  would like to continue to work hard with low-fat diet , and wait until after she sees endocrinology for further suggestions regarding medication changes. I think MRI abdomen/MRCP will be most useful imaging study to exclude chronic pancreatitis, pancreas divisum, or other ductal abnormality and also assess for biliary sludge/choledocholithiasis which is less likely. Will be established with Dr. Loletha Carrow, and will arrange follow-up pending results of imaging. Will also need repeat lipid panel in 2 months  Amy S Esterwood PA-C 04/15/2018   Cc: Brunetta Jeans, PA-C

## 2018-04-23 ENCOUNTER — Ambulatory Visit (HOSPITAL_COMMUNITY)
Admission: RE | Admit: 2018-04-23 | Discharge: 2018-04-23 | Disposition: A | Discharge status: Home / Self Care | Payer: Self-pay | Source: Ambulatory Visit | Attending: Physician Assistant | Admitting: Physician Assistant

## 2018-04-23 ENCOUNTER — Other Ambulatory Visit: Payer: Self-pay | Admitting: Physician Assistant

## 2018-04-23 DIAGNOSIS — K760 Fatty (change of) liver, not elsewhere classified: Secondary | ICD-10-CM | POA: Insufficient documentation

## 2018-04-23 DIAGNOSIS — K8689 Other specified diseases of pancreas: Secondary | ICD-10-CM | POA: Insufficient documentation

## 2018-04-23 DIAGNOSIS — K859 Acute pancreatitis without necrosis or infection, unspecified: Secondary | ICD-10-CM

## 2018-04-23 DIAGNOSIS — K838 Other specified diseases of biliary tract: Secondary | ICD-10-CM | POA: Insufficient documentation

## 2018-04-23 MED ORDER — GADOBUTROL 1 MMOL/ML IV SOLN
10.0000 mL | Freq: Once | INTRAVENOUS | Status: AC | PRN
  Administered 2018-04-23: 10 mL via INTRAVENOUS

## 2018-05-06 ENCOUNTER — Ambulatory Visit: Payer: Self-pay | Admitting: Endocrinology

## 2018-05-06 ENCOUNTER — Encounter: Payer: Self-pay | Admitting: Physician Assistant

## 2018-05-08 ENCOUNTER — Encounter: Payer: Self-pay | Admitting: Family Medicine

## 2018-05-08 ENCOUNTER — Ambulatory Visit: Payer: Self-pay | Admitting: Family Medicine

## 2018-05-08 ENCOUNTER — Other Ambulatory Visit: Payer: Self-pay

## 2018-05-08 VITALS — BP 142/90 | HR 103 | Temp 98.2°F | Ht 64.0 in | Wt 201.8 lb

## 2018-05-08 DIAGNOSIS — E1165 Type 2 diabetes mellitus with hyperglycemia: Secondary | ICD-10-CM

## 2018-05-08 DIAGNOSIS — M47816 Spondylosis without myelopathy or radiculopathy, lumbar region: Secondary | ICD-10-CM | POA: Insufficient documentation

## 2018-05-08 LAB — POCT URINALYSIS DIPSTICK
Bilirubin, UA: NEGATIVE
GLUCOSE UA: NEGATIVE
Ketones, UA: 5
Leukocytes, UA: NEGATIVE
NITRITE UA: NEGATIVE
Protein, UA: POSITIVE — AB
RBC UA: NEGATIVE
Urobilinogen, UA: 0.2 E.U./dL
pH, UA: 6 (ref 5.0–8.0)

## 2018-05-08 MED ORDER — TRAMADOL HCL 50 MG PO TABS: 50.0000 mg | ORAL_TABLET | Freq: Three times a day (TID) | ORAL | 0 refills | Status: DC | PRN

## 2018-05-08 NOTE — Patient Instructions (Signed)
Please follow up if symptoms do not improve or as needed.   Take advil 2-3x/day for the next few days and use warm compresses on the back. Start back stretches.  Use the tramadol if needed at night.   If you have any questions or concerns, please don't hesitate to send me a message via MyChart or call the office at [phone]. Thank you for visiting with [place] today! It's our pleasure caring for you.

## 2018-05-08 NOTE — Progress Notes (Signed)
Subjective  CC:  Chief Complaint  Patient presents with  . Back Pain    Right Lower Back Pain since tuesday     HPI: Teresa Parks is a 47 y.o. female who presents to the office today to address the problems listed above in the chief complaint.  See mychart note.   Pt with known DJD of lumbar spine with h/o intermittent low back pain. Now with right sided low back pain; responded well to chiropractor treatment yesterday but more painful today. Worried something else may be going on:  No dysuria, gross hematuria, + urinary frequency chronic due to DM  No fevers; admits to intermittent hot flushes - ? Perimenopausal.  No radicular sxs; no b/b incontinence.   Otherwise feels well. Is working on getting dm under control; has quit sodas.  Assessment  1. Right low back pain, unspecified chronicity, unspecified whether sciatica present      Plan   DJD, lumbar:  Reassured. Start nsaids bid/tid and ultram if needed at night. Should resolve. Stretches, chiropractor and time.   Suspect mild tachy and increased bp due to dehydration and pain. Will recheck at next visit. Push water intake.   Follow up: f/u as needed   Orders Placed This Encounter  Procedures  . POCT urinalysis dipstick   No orders of the defined types were placed in this encounter.     I reviewed the patients updated PMH, FH, and SocHx.    Patient Active Problem List   Diagnosis Date Noted  . Apneic episode 01/13/2017  . Sacroiliac pain 08/19/2016  . Hx of pancreatitis 07/05/2015  . Hyperlipidemia associated with type 2 diabetes mellitus ([place]) 05/10/2014  . Obesity 05/10/2014  . Type II diabetes mellitus, uncontrolled ([place]) 03/15/2014  . Hypertriglyceridemia 03/15/2014  . Tobacco abuse 02/06/2014  . OA (osteoarthritis) of knee 02/06/2014   Current Meds  Medication Sig  . atorvastatin (LIPITOR) 40 MG tablet TAKE ONE TABLET BY MOUTH ONCE DAILY  . cyclobenzaprine (FLEXERIL) 10 MG tablet TAKE 1 TABLET BY  MOUTH ONCE DAILY AT BEDTIME  . fenofibrate 160 MG tablet TAKE ONE TABLET BY MOUTH ONCE DAILY  . glucose blood test strip Use as instructed  . insulin NPH Human (NOVOLIN N) 100 UNIT/ML injection Inject 0.6 mLs (60 Units total) into the skin every morning. And syringes 1/day  . Lancets (FREESTYLE) lancets Use as instructed  . metFORMIN (GLUCOPHAGE-XR) 500 MG 24 hr tablet Take 4 tablets (2,000 mg total) by mouth daily.  Marland Kitchen olmesartan (BENICAR) 5 MG tablet TAKE 1 TABLET BY MOUTH ONCE DAILY    Allergies: Patient is allergic to novocain [procaine]. Family History: Patient family history includes Alzheimer's disease in her maternal grandfather; Diabetes in her father, mother, and paternal uncle; Healthy in her brother and daughter; Heart attack (age of onset: 60) in her father; Hypertension in her mother. Social History:  Patient  reports that she has been smoking cigarettes. She has never used smokeless tobacco. She reports that she does not drink alcohol or use drugs.  Review of Systems: Constitutional: Negative for fever malaise or anorexia Cardiovascular: negative for chest pain Respiratory: negative for SOB or persistent cough Gastrointestinal: negative for abdominal pain  Objective  Vitals: BP (!) 142/90   Pulse (!) 103   Temp 98.2 F (36.8 C)   Ht 5\' 4"  (1.626 m)   Wt 201 lb 12.8 oz (91.5 kg)   SpO2 98%   BMI 34.64 kg/m  General: no acute distress , A&Ox3, moves well  HEENT: PEERL, conjunctiva normal, Oropharynx moist,neck is supple Cardiovascular:  RRR without murmur or gallop.  Respiratory:  Good breath sounds bilaterally, CTAB with normal respiratory effort, no CVAT Gastrointestinal: soft, flat abdomen, normal active bowel sounds, no palpable masses, no hepatosplenomegaly, no appreciated hernias No suprapubic ttp Skin:  Warm, no rashes  Office Visit on 05/08/2018  Component Date Value Ref Range Status  . Color, UA 05/08/2018 Yellow   Final  . Clarity, UA 05/08/2018 Clear    Final  . Glucose, UA 05/08/2018 Negative  Negative Final  . Bilirubin, UA 05/08/2018 Negative   Final  . Ketones, UA 05/08/2018 5   Final  . Spec Grav, UA 05/08/2018 >=1.030* 1.010 - 1.025 Final  . Blood, UA 05/08/2018 Negative   Final  . pH, UA 05/08/2018 6.0  5.0 - 8.0 Final  . Protein, UA 05/08/2018 Positive* Negative Final  . Urobilinogen, UA 05/08/2018 0.2  0.2 or 1.0 E.U./dL Final  . Nitrite, UA 05/08/2018 Negative   Final  . Leukocytes, UA 05/08/2018 Negative  Negative Final      Commons side effects, risks, benefits, and alternatives for medications and treatment plan prescribed today were discussed, and the patient expressed understanding of the given instructions. Patient is instructed to call or message via MyChart if he/she has any questions or concerns regarding our treatment plan. No barriers to understanding were identified. We discussed Red Flag symptoms and signs in detail. Patient expressed understanding regarding what to do in case of urgent or emergency type symptoms.   Medication list was reconciled, printed and provided to the patient in AVS. Patient instructions and summary information was reviewed with the patient as documented in the AVS. This note was prepared with assistance of Dragon voice recognition software. Occasional wrong-word or sound-a-like substitutions may have occurred due to the inherent limitations of voice recognition software

## 2018-05-15 ENCOUNTER — Other Ambulatory Visit: Payer: Self-pay | Admitting: Physician Assistant

## 2018-05-21 ENCOUNTER — Ambulatory Visit: Payer: Self-pay | Admitting: Endocrinology

## 2018-05-21 ENCOUNTER — Encounter: Payer: Self-pay | Admitting: Endocrinology

## 2018-05-21 VITALS — BP 142/80 | HR 100 | Ht 64.0 in | Wt 205.6 lb

## 2018-05-21 DIAGNOSIS — E1165 Type 2 diabetes mellitus with hyperglycemia: Secondary | ICD-10-CM

## 2018-05-21 LAB — POCT GLYCOSYLATED HEMOGLOBIN (HGB A1C): Hemoglobin A1C: 7.7 % — AB (ref 4.0–5.6)

## 2018-05-21 MED ORDER — INSULIN NPH (HUMAN) (ISOPHANE) 100 UNIT/ML ~~LOC~~ SUSP: 64.0000 [IU] | SUBCUTANEOUS | 11 refills | Status: DC

## 2018-05-21 NOTE — Patient Instructions (Addendum)
Your blood pressure is high today.  Please see your primary care provider soon, to have it rechecked check your blood sugar once a day.  vary the time of day when you check, between before the 3 meals, and at bedtime.  also check if you have symptoms of your blood sugar being too high or too low.  please keep a record of the readings and bring it to your next appointment here (or you can bring the meter itself).  You can write it on any piece of paper.  please call us sooner if your blood sugar goes below 70, or if you have a lot of readings over 200.   Please increase the NPH insulin to 64 units each morning, and:  Please continue the same metformin.  Please come back for a follow-up appointment in 3 months.

## 2018-05-21 NOTE — Progress Notes (Signed)
Subjective:    Patient ID: Teresa Parks, female    DOB: 15-Dec-1970, 47 y.o.   MRN: [id number]  HPI Pt returns for f/u of diabetes mellitus: DM type: Insulin-requiring type 2 Dx'ed: 6578 Complications: none Therapy: insulin since dx, and metformin GDM: never DKA: never Severe hypoglycemia: never Pancreatitis: once, in 2015 Pancreatic imaging: normal on 2016 [place] Other: she does not have insurance; she has severe hypertriglyceridemia; she declines multiple daily injections.   Interval history: no cbg record, but states cbg's vary from 97-150. There is no trend throughout the day. pt states she feels well in general.  She never misses the insulin.  Past Medical History:  Diagnosis Date  . Chronic ear infection   . Diabetes mellitus type II, controlled ([place])   . HTN (hypertension)   . Hyperlipidemia   . Pancreatitis, acute     Past Surgical History:  Procedure Laterality Date  . KNEE ARTHROSCOPY Right   . UMBILICAL HERNIA REPAIR    . WISDOM TOOTH EXTRACTION      Social History   Socioeconomic History  . Marital status: Married    Spouse name: Not on file  . Number of children: 1  . Years of education: Not on file  . Highest education level: Not on file  Occupational History  . Occupation: Chief Financial Officer  Social Needs  . Financial resource strain: Not on file  . Food insecurity:    Worry: Not on file    Inability: Not on file  . Transportation needs:    Medical: Not on file    Non-medical: Not on file  Tobacco Use  . Smoking status: Current Every Day Smoker    Types: Cigarettes  . Smokeless tobacco: Never Used  Substance and Sexual Activity  . Alcohol use: No    Alcohol/week: 0.0 standard drinks  . Drug use: No  . Sexual activity: Not on file  Lifestyle  . Physical activity:    Days per week: Not on file    Minutes per session: Not on file  . Stress: Not on file  Relationships  . Social connections:    Talks on phone: Not on file    Gets  together: Not on file    Attends religious service: Not on file    Active member of club or organization: Not on file    Attends meetings of clubs or organizations: Not on file    Relationship status: Not on file  . Intimate partner violence:    Fear of current or ex partner: Not on file    Emotionally abused: Not on file    Physically abused: Not on file    Forced sexual activity: Not on file  Other Topics Concern  . Not on file  Social History Narrative  . Not on file    Current Outpatient Medications on File Prior to Visit  Medication Sig Dispense Refill  . atorvastatin (LIPITOR) 40 MG tablet TAKE ONE TABLET BY MOUTH ONCE DAILY 30 tablet 5  . cyclobenzaprine (FLEXERIL) 10 MG tablet TAKE 1 TABLET BY MOUTH ONCE DAILY AT BEDTIME 30 tablet 0  . fenofibrate 160 MG tablet TAKE ONE TABLET BY MOUTH ONCE DAILY 30 tablet 5  . glucose blood test strip Use as instructed 100 each 12  . Lancets (FREESTYLE) lancets Use as instructed 100 each 12  . metFORMIN (GLUCOPHAGE-XR) 500 MG 24 hr tablet Take 4 tablets (2,000 mg total) by mouth daily. 360 tablet 3  . olmesartan (BENICAR) 5 MG  tablet TAKE 1 TABLET BY MOUTH ONCE DAILY 90 tablet 0  . traMADol (ULTRAM) 50 MG tablet Take 1 tablet (50 mg total) by mouth every 8 (eight) hours as needed. 15 tablet 0   No current facility-administered medications on file prior to visit.     Allergies  Allergen Reactions  . Novocain [Procaine] Nausea And Vomiting    Family History  Problem Relation Age of Onset  . Heart attack Father 9       Deceased  . Diabetes Father   . Diabetes Mother   . Hypertension Mother        Living  . Alzheimer's disease Maternal Grandfather   . Healthy Brother   . Healthy Daughter   . Diabetes Paternal Uncle     BP (!) 142/80 (BP Location: Left Arm, Patient Position: Sitting, Cuff Size: Normal)   Pulse 100   Ht 5\' 4"  (1.626 m)   Wt 205 lb 9.6 oz (93.3 kg)   SpO2 95%   BMI 35.29 kg/m    Review of Systems She  denies hypoglycemia    Objective:   Physical Exam VITAL SIGNS:  See vs page GENERAL: no distress Pulses: dorsalis pedis intact bilat.   MSK: no deformity of the feet CV: no leg edema Skin:  no ulcer on the feet.  normal color and temp on the feet. Neuro: sensation is intact to touch on the feet  A1c=7.7%     Assessment & Plan:  HTN: is noted today Insulin-requiring type 2 DM: she needs increased rx Hypertriglyceridemia: in this setting, we should maximize insulin rx Pancreatitis, by hx: this limits rx options.    Patient Instructions  Your blood pressure is high today.  Please see your primary care provider soon, to have it rechecked check your blood sugar once a day.  vary the time of day when you check, between before the 3 meals, and at bedtime.  also check if you have symptoms of your blood sugar being too high or too low.  please keep a record of the readings and bring it to your next appointment here (or you can bring the meter itself).  You can write it on any piece of paper.  please call us sooner if your blood sugar goes below 70, or if you have a lot of readings over 200.   Please increase the NPH insulin to 64 units each morning, and:  Please continue the same metformin.  Please come back for a follow-up appointment in 3 months.

## 2018-06-09 ENCOUNTER — Other Ambulatory Visit (INDEPENDENT_AMBULATORY_CARE_PROVIDER_SITE_OTHER): Payer: Self-pay

## 2018-06-09 DIAGNOSIS — K859 Acute pancreatitis without necrosis or infection, unspecified: Secondary | ICD-10-CM

## 2018-06-09 LAB — LIPID PANEL
Cholesterol: 221 mg/dL — ABNORMAL HIGH (ref 0–200)
HDL: 23.9 mg/dL — AB (ref >39.00)
Total CHOL/HDL Ratio: 9
Triglycerides: 1377 mg/dL — ABNORMAL HIGH (ref 0.0–149.0)

## 2018-06-09 LAB — LDL CHOLESTEROL, DIRECT: Direct LDL: 29 mg/dL

## 2018-08-22 ENCOUNTER — Encounter: Payer: Self-pay | Admitting: Physician Assistant

## 2018-08-22 ENCOUNTER — Telehealth: Payer: Self-pay | Admitting: Emergency Medicine

## 2018-08-22 ENCOUNTER — Other Ambulatory Visit: Payer: Self-pay

## 2018-08-22 ENCOUNTER — Ambulatory Visit: Payer: Self-pay | Admitting: Physician Assistant

## 2018-08-22 VITALS — BP 152/84 | HR 86 | Temp 97.9°F | Resp 15 | Ht 64.0 in | Wt 208.4 lb

## 2018-08-22 DIAGNOSIS — B9689 Other specified bacterial agents as the cause of diseases classified elsewhere: Secondary | ICD-10-CM

## 2018-08-22 DIAGNOSIS — J208 Acute bronchitis due to other specified organisms: Secondary | ICD-10-CM

## 2018-08-22 MED ORDER — AMOXICILLIN-POT CLAVULANATE 875-125 MG PO TABS: 1.0000 | ORAL_TABLET | Freq: Two times a day (BID) | ORAL | 0 refills | Status: DC

## 2018-08-22 MED ORDER — HYDROCOD POLST-CPM POLST ER 10-8 MG/5ML PO SUER: 5.0000 mL | Freq: Two times a day (BID) | ORAL | 0 refills | Status: DC | PRN

## 2018-08-22 NOTE — Patient Instructions (Signed)
Take antibiotic (Augmentin) as directed.  Increase fluids.  Get plenty of rest. Use Mucinex for congestion. Tussionex per orders. Take a daily probiotic (I recommend Align or Culturelle, but even Activia Yogurt may be beneficial).  A humidifier placed in the bedroom may offer some relief for a dry, scratchy throat of nasal irritation.  Read information below on acute bronchitis. Please call or return to clinic if symptoms are not improving.  Acute Bronchitis Bronchitis is when the airways that extend from the windpipe into the lungs get red, puffy, and painful (inflamed). Bronchitis often causes thick spit (mucus) to develop. This leads to a cough. A cough is the most common symptom of bronchitis. In acute bronchitis, the condition usually begins suddenly and goes away over time (usually in 2 weeks). Smoking, allergies, and asthma can make bronchitis worse. Repeated episodes of bronchitis may cause more lung problems.  HOME CARE  Rest.  Drink enough fluids to keep your pee (urine) clear or pale yellow (unless you need to limit fluids as told by your doctor).  Only take over-the-counter or prescription medicines as told by your doctor.  Avoid smoking and secondhand smoke. These can make bronchitis worse. If you are a smoker, think about using nicotine gum or skin patches. Quitting smoking will help your lungs heal faster.  Reduce the chance of getting bronchitis again by:  Washing your hands often.  Avoiding people with cold symptoms.  Trying not to touch your hands to your mouth, nose, or eyes.  Follow up with your doctor as told.  GET HELP IF: Your symptoms do not improve after 1 week of treatment. Symptoms include:  Cough.  Fever.  Coughing up thick spit.  Body aches.  Chest congestion.  Chills.  Shortness of breath.  Sore throat.  GET HELP RIGHT AWAY IF:   You have an increased fever.  You have chills.  You have severe shortness of breath.  You have bloody  thick spit (sputum).  You throw up (vomit) often.  You lose too much body fluid (dehydration).  You have a severe headache.  You faint.  MAKE SURE YOU:   Understand these instructions.  Will watch your condition.  Will get help right away if you are not doing well or get worse. Document Released: 12/19/2007 Document Revised: 03/04/2013 Document Reviewed: 12/23/2012 [facility] Patient Information 2015 [place], [place]. This information is not intended to replace advice given to you by your health care provider. Make sure you discuss any questions you have with your health care provider.

## 2018-08-22 NOTE — Telephone Encounter (Signed)
Spoke with patient pharmacy and for the cough syrup they only allow 7 days of cough syrup for acute cough. If patient needs another refill of the cough syrup then a new rx will need to be sent to the pharmacy.  Copied from [place] [phone]. Topic: General - Other >> Aug 22, 2018  3:41 PM Leward Quan A wrote: Reason for CRM: Patient called to inform Dr Hassell Done that the pharmacy will only fill the Rx for 62ml of the chlorpheniramine-HYDROcodone [facility]) 10-8 MG/5ML SURE, Patient wanted to inform Dr of this just in case she may need a refill.

## 2018-08-22 NOTE — Progress Notes (Signed)
Patient presents to clinic today c/o 2 weeks of cough, nasal congestion, nasal drainage, clear-green sputum, ear pressure and fullness. Notes cough is so significant it is keeping her from sleep. Had one episode where she coughed so hard she threw up. Denies fever, chills, nausea/vomiting, diarrhea. Denies SOB. Symptoms are not improving and are just stagnant. Has taken Nyquil and Mucinex but notes this is not helping much. Trying to avoid decongestants as they make her feel weird.  Patient has been working on smoking cessation. Has cut back from 1 ppd and now down to 1/2 ppd.   Past Medical History:  Diagnosis Date  . Chronic ear infection   . Diabetes mellitus type II, controlled ([place])   . HTN (hypertension)   . Hyperlipidemia   . Pancreatitis, acute     Current Outpatient Medications on File Prior to Visit  Medication Sig Dispense Refill  . atorvastatin (LIPITOR) 40 MG tablet TAKE ONE TABLET BY MOUTH ONCE DAILY 30 tablet 5  . cyclobenzaprine (FLEXERIL) 10 MG tablet TAKE 1 TABLET BY MOUTH ONCE DAILY AT BEDTIME 30 tablet 0  . fenofibrate 160 MG tablet TAKE ONE TABLET BY MOUTH ONCE DAILY 30 tablet 5  . glucose blood test strip Use as instructed 100 each 12  . insulin NPH Human (NOVOLIN N) 100 UNIT/ML injection Inject 0.64 mLs (64 Units total) into the skin every morning. And syringes 1/day 20 mL 11  . Lancets (FREESTYLE) lancets Use as instructed 100 each 12  . metFORMIN (GLUCOPHAGE-XR) 500 MG 24 hr tablet Take 4 tablets (2,000 mg total) by mouth daily. 360 tablet 3  . olmesartan (BENICAR) 5 MG tablet TAKE 1 TABLET BY MOUTH ONCE DAILY 90 tablet 0  . traMADol (ULTRAM) 50 MG tablet Take 1 tablet (50 mg total) by mouth every 8 (eight) hours as needed. 15 tablet 0   No current facility-administered medications on file prior to visit.     Allergies  Allergen Reactions  . Novocain [Procaine] Nausea And Vomiting    Family History  Problem Relation Age of Onset  . Heart attack Father  60       Deceased  . Diabetes Father   . Diabetes Mother   . Hypertension Mother        Living  . Alzheimer's disease Maternal Grandfather   . Healthy Brother   . Healthy Daughter   . Diabetes Paternal Uncle     Social History   Socioeconomic History  . Marital status: Married    Spouse name: Not on file  . Number of children: 1  . Years of education: Not on file  . Highest education level: Not on file  Occupational History  . Occupation: Chief Financial Officer  Social Needs  . Financial resource strain: Not on file  . Food insecurity:    Worry: Not on file    Inability: Not on file  . Transportation needs:    Medical: Not on file    Non-medical: Not on file  Tobacco Use  . Smoking status: Current Every Day Smoker    Types: Cigarettes  . Smokeless tobacco: Never Used  Substance and Sexual Activity  . Alcohol use: No    Alcohol/week: 0.0 standard drinks  . Drug use: No  . Sexual activity: Not on file  Lifestyle  . Physical activity:    Days per week: Not on file    Minutes per session: Not on file  . Stress: Not on file  Relationships  . Social connections:  Talks on phone: Not on file    Gets together: Not on file    Attends religious service: Not on file    Active member of club or organization: Not on file    Attends meetings of clubs or organizations: Not on file    Relationship status: Not on file  Other Topics Concern  . Not on file  Social History Narrative  . Not on file   Review of Systems - See HPI.  All other ROS are negative.  BP (!) 152/84   Pulse 86   Temp 97.9 F (36.6 C) (Oral)   Resp 15   Ht 5\' 4"  (1.626 m)   Wt 208 lb 6.4 oz (94.5 kg)   SpO2 96%   BMI 35.77 kg/m   Physical Exam Vitals signs reviewed.  Constitutional:      Appearance: Normal appearance.  HENT:     Head: Normocephalic and atraumatic.     Right Ear: Ear canal normal. A middle ear effusion (serous) is present.     Left Ear: Ear canal normal. A middle ear  effusion (scant serous) is present.     Nose: Nose normal.     Mouth/Throat:     Mouth: Mucous membranes are moist.  Eyes:     Pupils: Pupils are equal, round, and reactive to light.  Neck:     Musculoskeletal: Neck supple.  Cardiovascular:     Rate and Rhythm: Normal rate and regular rhythm.     Pulses: Normal pulses.     Heart sounds: Normal heart sounds.  Pulmonary:     Effort: Pulmonary effort is normal.     Breath sounds: Normal breath sounds.  Neurological:     Mental Status: She is alert.  Psychiatric:        Mood and Affect: Mood normal.     Recent Results (from the past 2160 hour(s))  Lipid Profile     Status: Abnormal   Collection Time: 06/09/18  2:10 PM  Result Value Ref Range   Cholesterol 221 (H) 0 - 200 mg/dL    Comment: ATP III Classification       Desirable:  < 200 mg/dL               Borderline High:  200 - 239 mg/dL          High:  > = 240 mg/dL   Triglycerides (H) 0.0 - 149.0 mg/dL    1377.0 Triglyceride is over 400; calculations on Lipids are invalid.    Comment: Normal:  <150 mg/dLBorderline High:  150 - 199 mg/dL   HDL 23.90 (L) >39.00 mg/dL   Total CHOL/HDL Ratio 9     Comment:                Men          Women1/2 Average Risk     3.4          3.3Average Risk          5.0          4.42X Average Risk          9.6          7.13X Average Risk          15.0          11.0                      LDL cholesterol, direct     Status: None  Collection Time: 06/09/18  2:10 PM  Result Value Ref Range   Direct LDL 29.0 mg/dL    Comment: Optimal:  <100 mg/dLNear or Above Optimal:  100-129 mg/dLBorderline High:  130-159 mg/dLHigh:  160-189 mg/dLVery High:  >190 mg/dL   Assessment/Plan: 1. Acute bacterial bronchitis Rx Augmentin.  Increase fluids.  Rest.  Saline nasal spray.  Probiotic.  Mucinex as directed.  Humidifier in bedroom. Tussionex per orders.  Call or return to clinic if symptoms are not improving.  - chlorpheniramine-HYDROcodone (TUSSIONEX PENNKINETIC ER)  10-8 MG/5ML SUER; Take 5 mLs by mouth every 12 (twelve) hours as needed.  Dispense: 140 mL; Refill: 0   [name], PA-C

## 2018-08-25 ENCOUNTER — Ambulatory Visit: Payer: Self-pay | Admitting: Endocrinology

## 2018-08-29 MED ORDER — HYDROCOD POLST-CPM POLST ER 10-8 MG/5ML PO SUER: 5.0000 mL | Freq: Two times a day (BID) | ORAL | 0 refills | Status: DC | PRN

## 2018-09-26 ENCOUNTER — Encounter: Payer: Self-pay | Admitting: Physician Assistant

## 2018-09-26 ENCOUNTER — Other Ambulatory Visit: Payer: Self-pay | Admitting: Physician Assistant

## 2018-10-06 ENCOUNTER — Encounter: Payer: Self-pay | Admitting: Physician Assistant

## 2018-10-29 ENCOUNTER — Encounter: Payer: Self-pay | Admitting: Physician Assistant

## 2018-12-09 ENCOUNTER — Encounter: Payer: Self-pay | Admitting: Sports Medicine

## 2018-12-09 ENCOUNTER — Ambulatory Visit: Payer: BLUE CROSS/BLUE SHIELD | Admitting: Sports Medicine

## 2018-12-09 ENCOUNTER — Other Ambulatory Visit: Payer: Self-pay

## 2018-12-09 VITALS — BP 159/88 | HR 88 | Temp 97.3°F

## 2018-12-09 DIAGNOSIS — Q742 Other congenital malformations of lower limb(s), including pelvic girdle: Secondary | ICD-10-CM | POA: Diagnosis not present

## 2018-12-09 DIAGNOSIS — M79676 Pain in unspecified toe(s): Secondary | ICD-10-CM

## 2018-12-09 DIAGNOSIS — E119 Type 2 diabetes mellitus without complications: Secondary | ICD-10-CM | POA: Diagnosis not present

## 2018-12-09 DIAGNOSIS — B351 Tinea unguium: Secondary | ICD-10-CM

## 2018-12-09 NOTE — Progress Notes (Signed)
Subjective: Teresa Parks is a 48 y.o. female patient with history of diabetes who presents to office today complaining of long,mildly painful nails while ambulating in shoes; unable to trim at left 2>right 2nd toenail. Patient states that the glucose reading this morning was 88 mg/dl. Last A1c 7.8. Patient denies any new changes in medication or new problems. Patient denies any new cramping, numbness, burning or tingling in the legs.  Review of Systems  All other systems reviewed and are negative.    Patient Active Problem List   Diagnosis Date Noted  . DJD (degenerative joint disease), lumbar 05/08/2018  . Apneic episode 01/13/2017  . Sacroiliac pain 08/19/2016  . Hx of pancreatitis 07/05/2015  . Hyperlipidemia associated with type 2 diabetes mellitus ([place]) 05/10/2014  . Obesity 05/10/2014  . Type II diabetes mellitus, uncontrolled ([place]) 03/15/2014  . Hypertriglyceridemia 03/15/2014  . Tobacco abuse 02/06/2014  . OA (osteoarthritis) of knee 02/06/2014   Current Outpatient Medications on File Prior to Visit  Medication Sig Dispense Refill  . atorvastatin (LIPITOR) 40 MG tablet TAKE ONE TABLET BY MOUTH ONCE DAILY 30 tablet 5  . cyclobenzaprine (FLEXERIL) 10 MG tablet TAKE 1 TABLET BY MOUTH ONCE DAILY AT BEDTIME 30 tablet 0  . fenofibrate 160 MG tablet TAKE ONE TABLET BY MOUTH ONCE DAILY 30 tablet 5  . glucose blood test strip Use as instructed 100 each 12  . insulin NPH Human (NOVOLIN N) 100 UNIT/ML injection Inject 0.64 mLs (64 Units total) into the skin every morning. And syringes 1/day 20 mL 11  . Lancets (FREESTYLE) lancets Use as instructed 100 each 12  . metFORMIN (GLUCOPHAGE-XR) 500 MG 24 hr tablet Take 4 tablets (2,000 mg total) by mouth daily. 360 tablet 3  . olmesartan (BENICAR) 5 MG tablet TAKE 1 TABLET BY MOUTH ONCE DAILY 90 tablet 0  . amoxicillin-clavulanate (AUGMENTIN) 875-125 MG tablet Take 1 tablet by mouth 2 (two) times daily. (Patient not taking: Reported on  12/09/2018) 14 tablet 0  . chlorpheniramine-HYDROcodone (TUSSIONEX PENNKINETIC ER) 10-8 MG/5ML SUER Take 5 mLs by mouth every 12 (twelve) hours as needed. (Patient not taking: Reported on 12/09/2018) 70 mL 0  . traMADol (ULTRAM) 50 MG tablet Take 1 tablet (50 mg total) by mouth every 8 (eight) hours as needed. (Patient not taking: Reported on 12/09/2018) 15 tablet 0   No current facility-administered medications on file prior to visit.    Allergies  Allergen Reactions  . Novocain [Procaine] Nausea And Vomiting    No results found for this or any previous visit (from the past 2160 hour(s)).  Objective: General: Patient is awake, alert, and oriented x 3 and in no acute distress.  Integument: Skin is warm, dry and supple bilateral. Left 2>Right 2nd toenails are tender, long, thickened and dystrophic with subungual debris, consistent with onychomycosis. No signs of infection. No open lesions or preulcerative lesions present bilateral. Remaining integument unremarkable.  Vasculature:  Dorsalis Pedis pulse 2/4 bilateral. Posterior Tibial pulse  1/4 bilateral.  Capillary fill time <3 sec 1-5 bilateral. Positive hair growth to the level of the digits. Temperature gradient within normal limits. No varicosities present bilateral. No edema present bilateral.   Neurology: The patient has intact sensation measured with a 5.07/10g Semmes Weinstein Monofilament at all pedal sites bilateral . Vibratory sensation intact bilateral with tuning fork. No Babinski sign present bilateral.   Musculoskeletal: Asymptomatic long 2nd toes noted bilateral. Muscular strength 5/5 in all lower extremity muscular groups bilateral without pain on range of motion .  No tenderness with calf compression bilateral.  Assessment and Plan: Problem List Items Addressed This Visit    None    Visit Diagnoses    Pain due to onychomycosis of toenail    -  Primary   Long toe       Diabetes mellitus without complication ([place])           -Examined patient. -Discussed and educated patient on diabetic foot care, especially with  regards to the vascular, neurological and musculoskeletal systems.  -Stressed the importance of good glycemic control and the detriment of not  controlling glucose levels in relation to the foot. -Mechanically debrided 2nd toenails bilateral using sterile nail nipper and filed with dremel without incident  -Dispensed toe cap for left -Answered all patient questions -Patient to return  in 3 months for at risk foot care -Patient advised to call the office if any problems or questions arise in the meantime. Advised patient if her toe nail pain continues may benefit from nail removal.  Landis Martins, DPM

## 2018-12-15 ENCOUNTER — Other Ambulatory Visit: Payer: Self-pay | Admitting: Physician Assistant

## 2018-12-15 DIAGNOSIS — E1165 Type 2 diabetes mellitus with hyperglycemia: Secondary | ICD-10-CM

## 2018-12-26 ENCOUNTER — Telehealth: Payer: Self-pay

## 2018-12-26 NOTE — Telephone Encounter (Signed)
LOV 05/21/18. Per Dr. Loanne Drilling, f/u in 3 mo. Called pt to reschedule appt. LVM requesting returned call.

## 2018-12-31 ENCOUNTER — Telehealth: Payer: Self-pay

## 2018-12-31 NOTE — Telephone Encounter (Signed)
LOV 05/21/18. Per Dr. Loanne Drilling, f/u in 3 mo. Called pt to reschedule appt. LVM requesting returned call.

## 2019-01-02 ENCOUNTER — Encounter: Payer: Self-pay | Admitting: Physician Assistant

## 2019-01-04 MED ORDER — CHANTIX STARTING MONTH PAK 0.5 MG X 11 & 1 MG X 42 PO TABS: ORAL_TABLET | ORAL | 0 refills | Status: DC

## 2019-01-12 ENCOUNTER — Encounter: Payer: Self-pay | Admitting: Family Medicine

## 2019-01-12 ENCOUNTER — Ambulatory Visit (INDEPENDENT_AMBULATORY_CARE_PROVIDER_SITE_OTHER): Payer: BC Managed Care – PPO | Admitting: Family Medicine

## 2019-01-12 ENCOUNTER — Other Ambulatory Visit: Payer: Self-pay

## 2019-01-12 VITALS — BP 165/87 | HR 85 | Temp 98.4°F | Resp 18 | Ht 64.0 in | Wt 208.6 lb

## 2019-01-12 DIAGNOSIS — M25572 Pain in left ankle and joints of left foot: Secondary | ICD-10-CM

## 2019-01-12 DIAGNOSIS — M25531 Pain in right wrist: Secondary | ICD-10-CM

## 2019-01-12 MED ORDER — DOXYCYCLINE HYCLATE 100 MG PO TABS: 100.0000 mg | ORAL_TABLET | Freq: Two times a day (BID) | ORAL | 0 refills | Status: DC

## 2019-01-12 MED ORDER — MELOXICAM 15 MG PO TABS: 15.0000 mg | ORAL_TABLET | Freq: Every day | ORAL | 0 refills | Status: DC

## 2019-01-12 NOTE — Assessment & Plan Note (Signed)
mobic prn Ice abx for possible cellulitis ---  Wrist was red and warm to touch Wear splint--- pt seems to think it helps

## 2019-01-12 NOTE — Progress Notes (Signed)
Patient ID: ANAVEY COOMBES, female    DOB: [birth year]  Age: 48 y.o. MRN: [id number]    Subjective:  Subjective  HPI STEVEY STAPLETON presents for R wrist pain  L ankle pain  She was working in the yard yesterday.    No known injury There was a mulberry bush scratched up her had and her wrist is sore and red  Review of Systems  Constitutional: Negative for chills and fever.  HENT: Negative for congestion and hearing loss.   Eyes: Negative for discharge.  Respiratory: Negative for cough and shortness of breath.   Cardiovascular: Negative for chest pain, palpitations and leg swelling.  Gastrointestinal: Negative for abdominal pain, blood in stool, constipation, diarrhea, nausea and vomiting.  Genitourinary: Negative for dysuria, frequency, hematuria and urgency.  Musculoskeletal: Positive for arthralgias and joint swelling. Negative for back pain and myalgias.  Skin: Negative for rash.  Allergic/Immunologic: Negative for environmental allergies.  Neurological: Negative for dizziness, weakness and headaches.  Hematological: Does not bruise/bleed easily.  Psychiatric/Behavioral: Negative for suicidal ideas. The patient is not nervous/anxious.     History Past Medical History:  Diagnosis Date   Chronic ear infection    Diabetes mellitus type II, controlled ([place])    HTN (hypertension)    Hyperlipidemia    Pancreatitis, acute     She has a past surgical history that includes Wisdom tooth extraction; Umbilical hernia repair; and Knee arthroscopy (Right).   Her family history includes Alzheimer's disease in her maternal grandfather; Diabetes in her father, mother, and paternal uncle; Healthy in her brother and daughter; Heart attack (age of onset: 99) in her father; Hypertension in her mother.She reports that she has been smoking cigarettes. She has never used smokeless tobacco. She reports that she does not drink alcohol or use drugs.  Current Outpatient Medications on File Prior to  Visit  Medication Sig Dispense Refill   atorvastatin (LIPITOR) 40 MG tablet Take 1 tablet by mouth once daily 30 tablet 0   cyclobenzaprine (FLEXERIL) 10 MG tablet TAKE 1 TABLET BY MOUTH ONCE DAILY AT BEDTIME 30 tablet 0   fenofibrate 160 MG tablet Take 1 tablet by mouth once daily 30 tablet 0   glucose blood test strip Use as instructed 100 each 12   insulin NPH Human (NOVOLIN N) 100 UNIT/ML injection Inject 0.64 mLs (64 Units total) into the skin every morning. And syringes 1/day 20 mL 11   Lancets (FREESTYLE) lancets Use as instructed 100 each 12   metFORMIN (GLUCOPHAGE-XR) 500 MG 24 hr tablet Take 4 tablets (2,000 mg total) by mouth daily. 360 tablet 3   olmesartan (BENICAR) 5 MG tablet Take 1 tablet by mouth once daily 30 tablet 0   traMADol (ULTRAM) 50 MG tablet Take 1 tablet (50 mg total) by mouth every 8 (eight) hours as needed. 15 tablet 0   varenicline (CHANTIX STARTING MONTH PAK) 0.5 MG X 11 & 1 MG X 42 tablet Take one 0.5 mg tablet by mouth once daily for 3 days, then increase to one 0.5 mg tablet twice daily for 4 days, then increase to one 1 mg tablet twice daily. 53 tablet 0   No current facility-administered medications on file prior to visit.      Objective:  Objective  Physical Exam Vitals signs and nursing note reviewed.  Constitutional:      Appearance: She is well-developed.  HENT:     Head: Normocephalic and atraumatic.  Eyes:     Conjunctiva/sclera: Conjunctivae normal.  Neck:     Musculoskeletal: Normal range of motion and neck supple.     Thyroid: No thyromegaly.     Vascular: No carotid bruit or JVD.  Cardiovascular:     Rate and Rhythm: Normal rate and regular rhythm.     Heart sounds: Normal heart sounds. No murmur.  Pulmonary:     Effort: Pulmonary effort is normal. No respiratory distress.     Breath sounds: Normal breath sounds. No wheezing or rales.  Chest:     Chest wall: No tenderness.  Musculoskeletal:        General: Swelling and  tenderness present.     Right wrist: She exhibits decreased range of motion and tenderness.     Left ankle: She exhibits decreased range of motion and swelling.       Arms:  Skin:    Findings: Erythema present.  Neurological:     Mental Status: She is alert and oriented to person, place, and time.    BP (!) 165/87 (BP Location: Left Arm, Patient Position: Sitting, Cuff Size: Normal)    Pulse 85    Temp 98.4 F (36.9 C) (Oral)    Resp 18    Ht 5\' 4"  (1.626 m)    Wt 208 lb 9.6 oz (94.6 kg)    SpO2 98%    BMI 35.81 kg/m  Wt Readings from Last 3 Encounters:  01/12/19 208 lb 9.6 oz (94.6 kg)  08/22/18 208 lb 6.4 oz (94.5 kg)  05/21/18 205 lb 9.6 oz (93.3 kg)     Lab Results  Component Value Date   WBC 10.2 04/02/2018   HGB 14.5 04/02/2018   HCT 43.4 04/02/2018   PLT 452.0 (H) 04/02/2018   GLUCOSE 176 (H) 04/02/2018   CHOL 221 (H) 06/09/2018   TRIG (H) 06/09/2018    1377.0 Triglyceride is over 400; calculations on Lipids are invalid.   HDL 23.90 (L) 06/09/2018   LDLDIRECT 29.0 06/09/2018   LDLCALC NOT CALC 09/19/2015   ALT 11 04/02/2018   AST 14 04/02/2018   NA 135 04/02/2018   K 3.9 04/02/2018   CL 98 04/02/2018   CREATININE 0.67 04/02/2018   BUN 14 04/02/2018   CO2 28 04/02/2018   TSH 1.377 07/06/2015   HGBA1C 7.7 (A) 05/21/2018   MICROALBUR 4.92 (H) 03/04/2014    Mr 3d Recon At Scanner  Result Date: 04/23/2018 CLINICAL DATA:  Recurrent pancreatitis. EXAM: MRI ABDOMEN WITHOUT AND WITH CONTRAST (INCLUDING MRCP) TECHNIQUE: Multiplanar multisequence MR imaging of the abdomen was performed both before and after the administration of intravenous contrast. Heavily T2-weighted images of the biliary and pancreatic ducts were obtained, and three-dimensional MRCP images were rendered by post processing. CONTRAST:  10 mL Gadavist COMPARISON:  Ultrasound on 07/05/2015 FINDINGS: Lower Chest: No acute findings. Hepatobiliary: No hepatic masses identified. A tiny sub-cm cyst is seen in  the right hepatic lobe. Moderate diffuse hepatic steatosis. Gallbladder is unremarkable. No evidence of biliary ductal dilatation, with common bile duct measuring 4 mm. No evidence of choledocholithiasis. Pancreas: No evidence of pancreatic edema or peripancreatic inflammatory changes. A small cystic lesion is seen in the pancreatic head measuring 12 mm on image 73/1304. This could represent a small pseudocyst or indolent cystic pancreatic neoplasm. No evidence of pancreatic ductal dilatation or pancreas divisum. Spleen: Within normal limits in size and appearance. Adrenals/Urinary Tract: No masses identified. Small approximately 1 cm cysts are noted in the upper and lower poles of the right kidney. No evidence of hydronephrosis.  Stomach/Bowel: No evidence of obstruction, inflammatory process or abnormal fluid collections. Vascular/Lymphatic: No pathologically enlarged lymph nodes. No abdominal aortic aneurysm. Other:  None. Musculoskeletal:  No suspicious bone lesions identified. IMPRESSION: No acute findings. 12 mm cystic lesion in the pancreatic head, which may represent a small pseudocyst or indolent cystic neoplasm. Recommend continued follow-up by MRI in 1 year. This recommendation follows ACR consensus guidelines: Management of Incidental Pancreatic Cysts: A White Paper of the ACR Incidental Findings Committee. [place] 4268;34:196-222. No evidence of biliary ductal dilatation or choledocholithiasis. Moderate hepatic steatosis. Electronically Signed   By: Earle Gell M.D.   On: 04/23/2018 14:10   Mr Abdomen Mrcp W Wo Contast  Result Date: 04/23/2018 CLINICAL DATA:  Recurrent pancreatitis. EXAM: MRI ABDOMEN WITHOUT AND WITH CONTRAST (INCLUDING MRCP) TECHNIQUE: Multiplanar multisequence MR imaging of the abdomen was performed both before and after the administration of intravenous contrast. Heavily T2-weighted images of the biliary and pancreatic ducts were obtained, and three-dimensional MRCP  images were rendered by post processing. CONTRAST:  10 mL Gadavist COMPARISON:  Ultrasound on 07/05/2015 FINDINGS: Lower Chest: No acute findings. Hepatobiliary: No hepatic masses identified. A tiny sub-cm cyst is seen in the right hepatic lobe. Moderate diffuse hepatic steatosis. Gallbladder is unremarkable. No evidence of biliary ductal dilatation, with common bile duct measuring 4 mm. No evidence of choledocholithiasis. Pancreas: No evidence of pancreatic edema or peripancreatic inflammatory changes. A small cystic lesion is seen in the pancreatic head measuring 12 mm on image 73/1304. This could represent a small pseudocyst or indolent cystic pancreatic neoplasm. No evidence of pancreatic ductal dilatation or pancreas divisum. Spleen: Within normal limits in size and appearance. Adrenals/Urinary Tract: No masses identified. Small approximately 1 cm cysts are noted in the upper and lower poles of the right kidney. No evidence of hydronephrosis. Stomach/Bowel: No evidence of obstruction, inflammatory process or abnormal fluid collections. Vascular/Lymphatic: No pathologically enlarged lymph nodes. No abdominal aortic aneurysm. Other:  None. Musculoskeletal:  No suspicious bone lesions identified. IMPRESSION: No acute findings. 12 mm cystic lesion in the pancreatic head, which may represent a small pseudocyst or indolent cystic neoplasm. Recommend continued follow-up by MRI in 1 year. This recommendation follows ACR consensus guidelines: Management of Incidental Pancreatic Cysts: A White Paper of the ACR Incidental Findings Committee. [place] 9798;92:119-417. No evidence of biliary ductal dilatation or choledocholithiasis. Moderate hepatic steatosis. Electronically Signed   By: Earle Gell M.D.   On: 04/23/2018 14:10     Assessment & Plan:  Plan  I have discontinued Leone B. Quinter's amoxicillin-clavulanate and chlorpheniramine-HYDROcodone. I am also having her start on meloxicam and doxycycline.  Additionally, I am having her maintain her glucose blood, freestyle, metFORMIN, traMADol, insulin NPH Human, olmesartan, fenofibrate, atorvastatin, cyclobenzaprine, and Chantix Starting Month Pak.  Meds ordered this encounter  Medications   meloxicam (MOBIC) 15 MG tablet    Sig: Take 1 tablet (15 mg total) by mouth daily.    Dispense:  30 tablet    Refill:  0   doxycycline (VIBRA-TABS) 100 MG tablet    Sig: Take 1 tablet (100 mg total) by mouth 2 (two) times daily.    Dispense:  20 tablet    Refill:  0    Problem List Items Addressed This Visit      Unprioritized   Acute left ankle pain    Tr swelling and tenderness  Rest , elevate ice meloxicam daily prn  Consider sport med if no better  Relevant Medications   meloxicam (MOBIC) 15 MG tablet   Right wrist pain - Primary    mobic prn Ice abx for possible cellulitis ---  Wrist was red and warm to touch Wear splint--- pt seems to think it helps        Relevant Medications   meloxicam (MOBIC) 15 MG tablet   doxycycline (VIBRA-TABS) 100 MG tablet      Follow-up: Return if symptoms worsen or fail to improve.  Ann Held, DO

## 2019-01-12 NOTE — Assessment & Plan Note (Signed)
Tr swelling and tenderness  Rest , elevate ice meloxicam daily prn  Consider sport med if no better

## 2019-01-23 DIAGNOSIS — Z1231 Encounter for screening mammogram for malignant neoplasm of breast: Secondary | ICD-10-CM | POA: Diagnosis not present

## 2019-01-23 DIAGNOSIS — Z01419 Encounter for gynecological examination (general) (routine) without abnormal findings: Secondary | ICD-10-CM | POA: Diagnosis not present

## 2019-01-23 DIAGNOSIS — Z124 Encounter for screening for malignant neoplasm of cervix: Secondary | ICD-10-CM | POA: Diagnosis not present

## 2019-01-23 DIAGNOSIS — Z1151 Encounter for screening for human papillomavirus (HPV): Secondary | ICD-10-CM | POA: Diagnosis not present

## 2019-01-23 DIAGNOSIS — Z6835 Body mass index (BMI) 35.0-35.9, adult: Secondary | ICD-10-CM | POA: Diagnosis not present

## 2019-01-23 LAB — RESULTS CONSOLE HPV: CHL HPV: NEGATIVE

## 2019-01-23 LAB — HM PAP SMEAR: HM Pap smear: NEGATIVE

## 2019-01-26 ENCOUNTER — Other Ambulatory Visit: Payer: Self-pay | Admitting: Obstetrics and Gynecology

## 2019-01-26 DIAGNOSIS — R928 Other abnormal and inconclusive findings on diagnostic imaging of breast: Secondary | ICD-10-CM

## 2019-01-26 DIAGNOSIS — N6489 Other specified disorders of breast: Secondary | ICD-10-CM

## 2019-01-27 ENCOUNTER — Other Ambulatory Visit: Payer: Self-pay

## 2019-01-27 ENCOUNTER — Encounter: Payer: Self-pay | Admitting: Endocrinology

## 2019-01-27 ENCOUNTER — Ambulatory Visit: Payer: BC Managed Care – PPO | Admitting: Endocrinology

## 2019-01-27 VITALS — BP 160/80 | HR 97 | Ht 64.0 in | Wt 203.8 lb

## 2019-01-27 DIAGNOSIS — E781 Pure hyperglyceridemia: Secondary | ICD-10-CM

## 2019-01-27 DIAGNOSIS — E1165 Type 2 diabetes mellitus with hyperglycemia: Secondary | ICD-10-CM

## 2019-01-27 LAB — POCT GLYCOSYLATED HEMOGLOBIN (HGB A1C): Hemoglobin A1C: 9.4 % — AB (ref 4.0–5.6)

## 2019-01-27 LAB — BASIC METABOLIC PANEL
BUN: 17 mg/dL (ref 6–23)
CO2: 25 mEq/L (ref 19–32)
Calcium: 9.2 mg/dL (ref 8.4–10.5)
Chloride: 102 mEq/L (ref 96–112)
Creatinine, Ser: 0.71 mg/dL (ref 0.40–1.20)
GFR: 87.37 mL/min (ref >60.00)
Glucose, Bld: 196 mg/dL — ABNORMAL HIGH (ref 70–99)
Potassium: 4 mEq/L (ref 3.5–5.1)
Sodium: 135 mEq/L (ref 135–145)

## 2019-01-27 LAB — LIPID PANEL
Cholesterol: 118 mg/dL (ref 0–200)
HDL: 24 mg/dL — ABNORMAL LOW (ref >39.00)
Total CHOL/HDL Ratio: 5
Triglycerides: 676 mg/dL — ABNORMAL HIGH (ref 0.0–149.0)

## 2019-01-27 LAB — LDL CHOLESTEROL, DIRECT: Direct LDL: 25 mg/dL

## 2019-01-27 LAB — TSH: TSH: 2.44 u[IU]/mL (ref 0.35–4.50)

## 2019-01-27 MED ORDER — INSULIN NPH (HUMAN) (ISOPHANE) 100 UNIT/ML ~~LOC~~ SUSP: 75.0000 [IU] | SUBCUTANEOUS | 11 refills | Status: DC

## 2019-01-27 NOTE — Patient Instructions (Addendum)
I have sent a prescription to your pharmacy, to increase the insulin to 75 units each morning. Blood tests are requested for you today.  We'll let you know about the results.  Your blood pressure is high today.  Please see your primary care provider soon, to have it rechecked check your blood sugar twice a day.  vary the time of day when you check, between before the 3 meals, and at bedtime.  also check if you have symptoms of your blood sugar being too high or too low.  please keep a record of the readings and bring it to your next appointment here (or you can bring the meter itself).  You can write it on any piece of paper.  please call us sooner if your blood sugar goes below 70, or if you have a lot of readings over 200. Please come back for a follow-up appointment in 2 months.

## 2019-01-27 NOTE — Progress Notes (Signed)
Subjective:    Patient ID: Teresa Parks, female    DOB: Oct 16, 1970, 48 y.o.   MRN: [id number]  HPI Pt returns for f/u of diabetes mellitus: DM type: Insulin-requiring type 2 Dx'ed: 4098 Complications: none Therapy: insulin since dx, and metformin GDM: never DKA: never Severe hypoglycemia: never Pancreatitis: once, in 2015 Pancreatic imaging: normal on 2016 [place] Other: she takes human insulin, as she does not have insurance; she also has severe hypertriglyceridemia; she declines multiple daily injections.   Interval history: no cbg record, but states cbg's vary from 120-200. There is no trend throughout the day.  pt states she feels well in general.  She never misses the insulin.  She says her diet is not good recently.  She requests to check TG's.  Past Medical History:  Diagnosis Date   Chronic ear infection    Diabetes mellitus type II, controlled ([place])    HTN (hypertension)    Hyperlipidemia    Pancreatitis, acute     Past Surgical History:  Procedure Laterality Date   KNEE ARTHROSCOPY Right    UMBILICAL HERNIA REPAIR     WISDOM TOOTH EXTRACTION      Social History   Socioeconomic History   Marital status: Married    Spouse name: Not on file   Number of children: 1   Years of education: Not on file   Highest education level: Not on file  Occupational History   Occupation: Aeronautical engineer strain: Not on file   Food insecurity    Worry: Not on file    Inability: Not on file   Transportation needs    Medical: Not on file    Non-medical: Not on file  Tobacco Use   Smoking status: Current Every Day Smoker    Types: Cigarettes   Smokeless tobacco: Never Used  Substance and Sexual Activity   Alcohol use: No    Alcohol/week: 0.0 standard drinks   Drug use: No   Sexual activity: Not on file  Lifestyle   Physical activity    Days per week: Not on file    Minutes per session: Not on file   Stress:  Not on file  Relationships   Social connections    Talks on phone: Not on file    Gets together: Not on file    Attends religious service: Not on file    Active member of club or organization: Not on file    Attends meetings of clubs or organizations: Not on file    Relationship status: Not on file   Intimate partner violence    Fear of current or ex partner: Not on file    Emotionally abused: Not on file    Physically abused: Not on file    Forced sexual activity: Not on file  Other Topics Concern   Not on file  Social History Narrative   Not on file    Current Outpatient Medications on File Prior to Visit  Medication Sig Dispense Refill   atorvastatin (LIPITOR) 40 MG tablet Take 1 tablet by mouth once daily 30 tablet 0   cyclobenzaprine (FLEXERIL) 10 MG tablet TAKE 1 TABLET BY MOUTH ONCE DAILY AT BEDTIME 30 tablet 0   doxycycline (VIBRA-TABS) 100 MG tablet Take 1 tablet (100 mg total) by mouth 2 (two) times daily. 20 tablet 0   fenofibrate 160 MG tablet Take 1 tablet by mouth once daily 30 tablet 0   glucose blood test strip  Use as instructed 100 each 12   Lancets (FREESTYLE) lancets Use as instructed 100 each 12   meloxicam (MOBIC) 15 MG tablet Take 1 tablet (15 mg total) by mouth daily. 30 tablet 0   metFORMIN (GLUCOPHAGE-XR) 500 MG 24 hr tablet Take 4 tablets (2,000 mg total) by mouth daily. (Patient taking differently: Take 1,000 mg by mouth daily. ) 360 tablet 3   olmesartan (BENICAR) 5 MG tablet Take 1 tablet by mouth once daily 30 tablet 0   varenicline (CHANTIX STARTING MONTH PAK) 0.5 MG X 11 & 1 MG X 42 tablet Take one 0.5 mg tablet by mouth once daily for 3 days, then increase to one 0.5 mg tablet twice daily for 4 days, then increase to one 1 mg tablet twice daily. 53 tablet 0   traMADol (ULTRAM) 50 MG tablet Take 1 tablet (50 mg total) by mouth every 8 (eight) hours as needed. 15 tablet 0   No current facility-administered medications on file prior to  visit.     Allergies  Allergen Reactions   Novocain [Procaine] Nausea And Vomiting    Family History  Problem Relation Age of Onset   Heart attack Father 53       Deceased   Diabetes Father    Diabetes Mother    Hypertension Mother        Living   Alzheimer's disease Maternal Grandfather    Healthy Brother    Healthy Daughter    Diabetes Paternal Uncle     BP (!) 160/80 (BP Location: Left Arm, Patient Position: Sitting, Cuff Size: Large)    Pulse 97    Ht 5\' 4"  (1.626 m)    Wt 203 lb 12.8 oz (92.4 kg)    SpO2 95%    BMI 34.98 kg/m    Review of Systems She denies hypoglycemia.      Objective:   Physical Exam VITAL SIGNS:  See vs page GENERAL: no distress Pulses: dorsalis pedis intact bilat.   MSK: no deformity of the feet CV: no leg edema Skin:  no ulcer on the feet.  normal color and temp on the feet. Neuro: sensation is intact to touch on the feet.   Lab Results  Component Value Date   HGBA1C 9.4 (A) 01/27/2019        Assessment & Plan:  Insulin-requiring type 2 DM: worse.   HTN: is noted today. Hypertriglyceridemia: recheck today  Patient Instructions  I have sent a prescription to your pharmacy, to increase the insulin to 75 units each morning. Blood tests are requested for you today.  We'll let you know about the results.  Your blood pressure is high today.  Please see your primary care provider soon, to have it rechecked check your blood sugar twice a day.  vary the time of day when you check, between before the 3 meals, and at bedtime.  also check if you have symptoms of your blood sugar being too high or too low.  please keep a record of the readings and bring it to your next appointment here (or you can bring the meter itself).  You can write it on any piece of paper.  please call us sooner if your blood sugar goes below 70, or if you have a lot of readings over 200. Please come back for a follow-up appointment in 2 months.

## 2019-01-28 LAB — HM DIABETES EYE EXAM

## 2019-01-29 ENCOUNTER — Encounter: Payer: Self-pay | Admitting: Emergency Medicine

## 2019-02-07 ENCOUNTER — Other Ambulatory Visit: Payer: Self-pay | Admitting: Physician Assistant

## 2019-02-07 DIAGNOSIS — E1165 Type 2 diabetes mellitus with hyperglycemia: Secondary | ICD-10-CM

## 2019-02-10 ENCOUNTER — Ambulatory Visit
Admission: RE | Admit: 2019-02-10 | Discharge: 2019-02-10 | Disposition: A | Discharge status: Home / Self Care | Payer: BC Managed Care – PPO | Source: Ambulatory Visit | Attending: Obstetrics and Gynecology | Admitting: Obstetrics and Gynecology

## 2019-02-10 ENCOUNTER — Other Ambulatory Visit: Payer: Self-pay | Admitting: Obstetrics and Gynecology

## 2019-02-10 ENCOUNTER — Other Ambulatory Visit: Payer: Self-pay

## 2019-02-10 DIAGNOSIS — N6489 Other specified disorders of breast: Secondary | ICD-10-CM

## 2019-02-10 DIAGNOSIS — R928 Other abnormal and inconclusive findings on diagnostic imaging of breast: Secondary | ICD-10-CM | POA: Diagnosis not present

## 2019-02-10 DIAGNOSIS — N6312 Unspecified lump in the right breast, upper inner quadrant: Secondary | ICD-10-CM | POA: Diagnosis not present

## 2019-02-10 DIAGNOSIS — N6310 Unspecified lump in the right breast, unspecified quadrant: Secondary | ICD-10-CM

## 2019-02-13 ENCOUNTER — Other Ambulatory Visit: Payer: Self-pay | Admitting: Endocrinology

## 2019-02-17 ENCOUNTER — Encounter: Payer: Self-pay | Admitting: Physician Assistant

## 2019-02-20 ENCOUNTER — Other Ambulatory Visit: Payer: Self-pay

## 2019-02-20 ENCOUNTER — Ambulatory Visit (INDEPENDENT_AMBULATORY_CARE_PROVIDER_SITE_OTHER): Payer: BC Managed Care – PPO | Admitting: Physician Assistant

## 2019-02-20 ENCOUNTER — Ambulatory Visit
Admission: RE | Admit: 2019-02-20 | Discharge: 2019-02-20 | Disposition: A | Discharge status: Home / Self Care | Payer: BC Managed Care – PPO | Source: Ambulatory Visit | Attending: Obstetrics and Gynecology | Admitting: Obstetrics and Gynecology

## 2019-02-20 ENCOUNTER — Encounter: Payer: Self-pay | Admitting: Physician Assistant

## 2019-02-20 ENCOUNTER — Other Ambulatory Visit (HOSPITAL_COMMUNITY): Payer: Self-pay | Admitting: Diagnostic Radiology

## 2019-02-20 DIAGNOSIS — N6311 Unspecified lump in the right breast, upper outer quadrant: Secondary | ICD-10-CM | POA: Diagnosis not present

## 2019-02-20 DIAGNOSIS — N6031 Fibrosclerosis of right breast: Secondary | ICD-10-CM | POA: Diagnosis not present

## 2019-02-20 DIAGNOSIS — M25532 Pain in left wrist: Secondary | ICD-10-CM | POA: Diagnosis not present

## 2019-02-20 DIAGNOSIS — N6310 Unspecified lump in the right breast, unspecified quadrant: Secondary | ICD-10-CM

## 2019-02-20 DIAGNOSIS — N6312 Unspecified lump in the right breast, upper inner quadrant: Secondary | ICD-10-CM | POA: Diagnosis not present

## 2019-02-20 MED ORDER — METFORMIN HCL ER 500 MG PO TB24: 2000.0000 mg | ORAL_TABLET | Freq: Every day | ORAL | 0 refills | Status: DC

## 2019-02-20 MED ORDER — MELOXICAM 15 MG PO TABS: 15.0000 mg | ORAL_TABLET | Freq: Every day | ORAL | 0 refills | Status: DC

## 2019-02-20 NOTE — Patient Instructions (Signed)
Please wear the thumb spica splint for the next 10-14 days. Elevate the wrist while resting. Take the Meloxicam once daily with food.  If not improving over the next week or so or anything worsening I would like to go ahead and set you up with a Hand Specialist.

## 2019-02-20 NOTE — Progress Notes (Signed)
Patient presents to clinic today c/o 4 days of medial writs pain of L wrist. Notes is aching in nature overall but with movement of the thumb will have some sharper pains. Denies trauma or injury. Denies numbness, tingling or decreased ROM. Notes some swelling on that side of wrist.     Past Medical History:  Diagnosis Date  . Chronic ear infection   . Diabetes mellitus type II, controlled ([place])   . HTN (hypertension)   . Hyperlipidemia   . Pancreatitis, acute     Current Outpatient Medications on File Prior to Visit  Medication Sig Dispense Refill  . atorvastatin (LIPITOR) 40 MG tablet Take 1 tablet by mouth once daily 30 tablet 3  . cyclobenzaprine (FLEXERIL) 10 MG tablet TAKE 1 TABLET BY MOUTH ONCE DAILY AT BEDTIME 30 tablet 0  . fenofibrate 160 MG tablet Take 1 tablet by mouth once daily 30 tablet 3  . glucose blood test strip Use as instructed 100 each 12  . Lancets (FREESTYLE) lancets Use as instructed 100 each 12  . meloxicam (MOBIC) 15 MG tablet Take 1 tablet (15 mg total) by mouth daily. 30 tablet 0  . metFORMIN (GLUCOPHAGE-XR) 500 MG 24 hr tablet Take 4 tablets (2,000 mg total) by mouth daily. (Patient taking differently: Take 1,000 mg by mouth daily. ) 360 tablet 3  . NOVOLIN N RELION 100 UNIT/ML injection INJECT 0.5 MLS (50 UNITS TOTAL) INTO THE SKIN EVERY MORNING 20 mL 0  . olmesartan (BENICAR) 5 MG tablet Take 1 tablet by mouth once daily 30 tablet 3  . varenicline (CHANTIX STARTING MONTH PAK) 0.5 MG X 11 & 1 MG X 42 tablet Take one 0.5 mg tablet by mouth once daily for 3 days, then increase to one 0.5 mg tablet twice daily for 4 days, then increase to one 1 mg tablet twice daily. 53 tablet 0   No current facility-administered medications on file prior to visit.     Allergies  Allergen Reactions  . Novocain [Procaine] Nausea And Vomiting    Family History  Problem Relation Age of Onset  . Heart attack Father 13       Deceased  . Diabetes Father   . Diabetes  Mother   . Hypertension Mother        Living  . Alzheimer's disease Maternal Grandfather   . Healthy Brother   . Healthy Daughter   . Diabetes Paternal Uncle     Social History   Socioeconomic History  . Marital status: Married    Spouse name: Not on file  . Number of children: 1  . Years of education: Not on file  . Highest education level: Not on file  Occupational History  . Occupation: Chief Financial Officer  Social Needs  . Financial resource strain: Not on file  . Food insecurity    Worry: Not on file    Inability: Not on file  . Transportation needs    Medical: Not on file    Non-medical: Not on file  Tobacco Use  . Smoking status: Current Every Day Smoker    Types: Cigarettes  . Smokeless tobacco: Never Used  Substance and Sexual Activity  . Alcohol use: No    Alcohol/week: 0.0 standard drinks  . Drug use: No  . Sexual activity: Not on file  Lifestyle  . Physical activity    Days per week: Not on file    Minutes per session: Not on file  . Stress: Not on file  Relationships  . Social Herbalist on phone: Not on file    Gets together: Not on file    Attends religious service: Not on file    Active member of club or organization: Not on file    Attends meetings of clubs or organizations: Not on file    Relationship status: Not on file  Other Topics Concern  . Not on file  Social History Narrative  . Not on file   Review of Systems - See HPI.  All other ROS are negative.  Wt 208 lb (94.3 kg)   BMI 35.70 kg/m   Physical Exam Vitals signs reviewed.  Constitutional:      Appearance: Normal appearance.  HENT:     Head: Normocephalic and atraumatic.     Nose: Nose normal.     Mouth/Throat:     Mouth: Mucous membranes are moist.  Neck:     Musculoskeletal: Neck supple.  Cardiovascular:     Rate and Rhythm: Normal rate and regular rhythm.  Musculoskeletal:     Left wrist: She exhibits tenderness. She exhibits normal range of motion and  no bony tenderness.     Comments: + Finkelstein test of L hand.  Neurological:     Mental Status: She is alert.     Recent Results (from the past 2160 hour(s))  POCT HgB A1C     Status: Abnormal   Collection Time: 01/27/19  7:57 AM  Result Value Ref Range   Hemoglobin A1C 9.4 (A) 4.0 - 5.6 %   HbA1c POC (<> result, manual entry)     HbA1c, POC (prediabetic range)     HbA1c, POC (controlled diabetic range)    Basic metabolic panel     Status: Abnormal   Collection Time: 01/27/19  8:10 AM  Result Value Ref Range   Sodium 135 135 - 145 mEq/L   Potassium 4.0 3.5 - 5.1 mEq/L   Chloride 102 96 - 112 mEq/L   CO2 25 19 - 32 mEq/L   Glucose, Bld 196 (H) 70 - 99 mg/dL   BUN 17 6 - 23 mg/dL   Creatinine, Ser 0.71 0.40 - 1.20 mg/dL   Calcium 9.2 8.4 - 10.5 mg/dL   GFR 87.37 >60.00 mL/min  TSH     Status: None   Collection Time: 01/27/19  8:10 AM  Result Value Ref Range   TSH 2.44 0.35 - 4.50 uIU/mL  Lipid panel     Status: Abnormal   Collection Time: 01/27/19  8:10 AM  Result Value Ref Range   Cholesterol 118 0 - 200 mg/dL    Comment: ATP III Classification       Desirable:  < 200 mg/dL               Borderline High:  200 - 239 mg/dL          High:  > = 240 mg/dL   Triglycerides (H) 0.0 - 149.0 mg/dL    676.0 Triglyceride is over 400; calculations on Lipids are invalid.    Comment: Normal:  <150 mg/dLBorderline High:  150 - 199 mg/dL   HDL 24.00 (L) >39.00 mg/dL   Total CHOL/HDL Ratio 5     Comment:                Men          Women1/2 Average Risk     3.4          3.3Average Risk  5.0          4.42X Average Risk          9.6          7.13X Average Risk          15.0          11.0                      LDL cholesterol, direct     Status: None   Collection Time: 01/27/19  8:10 AM  Result Value Ref Range   Direct LDL 25.0 mg/dL    Comment: Optimal:  <100 mg/dLNear or Above Optimal:  100-129 mg/dLBorderline High:  130-159 mg/dLHigh:  160-189 mg/dLVery High:  >190 mg/dL  HM  DIABETES EYE EXAM     Status: None   Collection Time: 01/28/19 12:00 AM  Result Value Ref Range   HM Diabetic Eye Exam No Retinopathy No Retinopathy   Assessment/Plan: 1. Left wrist pain Mild tenosynovitis noted. There is a small lateral ganglion cyst over lateral carpal. Non-tender. Thumb spica applied. Rx Meloxicam. RICE therapy recommended. Hand specialist if not improving.  - meloxicam (MOBIC) 15 MG tablet; Take 1 tablet (15 mg total) by mouth daily.  Dispense: 15 tablet; Refill: 0   [name], PA-C

## 2019-02-23 ENCOUNTER — Encounter: Payer: Self-pay | Admitting: Physician Assistant

## 2019-02-28 ENCOUNTER — Other Ambulatory Visit: Payer: Self-pay | Admitting: Physician Assistant

## 2019-03-10 ENCOUNTER — Ambulatory Visit: Payer: BLUE CROSS/BLUE SHIELD | Admitting: Sports Medicine

## 2019-03-10 ENCOUNTER — Encounter: Payer: Self-pay | Admitting: Physician Assistant

## 2019-03-10 DIAGNOSIS — M25532 Pain in left wrist: Secondary | ICD-10-CM

## 2019-03-12 ENCOUNTER — Encounter: Payer: Self-pay | Admitting: Orthopaedic Surgery

## 2019-03-12 ENCOUNTER — Ambulatory Visit (INDEPENDENT_AMBULATORY_CARE_PROVIDER_SITE_OTHER): Payer: BC Managed Care – PPO | Admitting: Orthopaedic Surgery

## 2019-03-12 ENCOUNTER — Ambulatory Visit: Payer: Self-pay

## 2019-03-12 VITALS — Ht 64.0 in | Wt 205.0 lb

## 2019-03-12 DIAGNOSIS — M25532 Pain in left wrist: Secondary | ICD-10-CM | POA: Diagnosis not present

## 2019-03-12 MED ORDER — PREDNISONE 10 MG (21) PO TBPK: ORAL_TABLET | ORAL | 0 refills | Status: DC

## 2019-03-12 NOTE — Progress Notes (Signed)
Office Visit Note   Patient: Teresa Parks           Date of Birth: 12-30-1970           MRN: [id number] Visit Date: 03/12/2019              Requested by: Brunetta Jeans, PA-C [address] [place],  [place] [address] PCP: Brunetta Jeans, PA-C   Assessment & Plan: Visit Diagnoses:  1. Pain in left wrist     Plan: Impression is left wrist de Quervain's Tenosynovitis.  We have discussed cortisone injection.  The patient would rather wear a thumb spica splint and try prednisone.  Will call this in.  We will also provide her with a new splint.  She will follow-up with [place] as needed.  Call with concerns or questions.  Follow-Up Instructions: Return if symptoms worsen or fail to improve.   Orders:  Orders Placed This Encounter  Procedures  . XR Wrist Complete Left   Meds ordered this encounter  Medications  . predniSONE (STERAPRED UNI-PAK 21 TAB) 10 MG (21) TBPK tablet    Sig: Take as directed    Dispense:  21 tablet    Refill:  0      Procedures: No procedures performed   Clinical Data: No additional findings.   Subjective: Chief Complaint  Patient presents with  . Left Wrist - Pain    HPI patient is a pleasant 48 year old right-hand-dominant female who presents our clinic today with left wrist pain.  This began approximately 3 weeks ago without any known injury or change in activity.  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  Pain she is primarily having is to the first dorsal compartment.  She has also noticed slight swelling to the first and second compartments as well as a cyst over the first dorsal compartment.  She notes that the cyst as well as the pain and swelling have been unchanged over the past 3 weeks.  She was seen by her PCP where she was provided with a thumb spica splint.  She does note mild relief while wearing the splint.  She denies any numbness, tingling or burning.   Review of Systems as detailed in HPI.  All others reviewed and are negative.   Objective: Vital Signs: Ht 5\' 4"  (1.626 m)   Wt 205 lb (93 kg)   BMI 35.19 kg/m   Physical Exam well-developed well-nourished female in no acute distress.  Alert and oriented x3.  Ortho Exam examination of the left wrist shows moderate tenderness along the first dorsal compartment.  She does have a pea-sized cyst to the radial styloid.  Positive Finkelstein.  She also has swelling between the first and second compartments but this is symmetrical.  She is neurovascular intact distally.  Specialty Comments:  No specialty comments available.  Imaging: Xr Wrist Complete Left  Result Date: 03/12/2019 No acute or structural abnormalities    PMFS History: Patient Active Problem List   Diagnosis Date Noted  . Right wrist pain 01/12/2019  . Acute left ankle pain 01/12/2019  . DJD (degenerative joint disease), lumbar 05/08/2018  . Apneic episode 01/13/2017  . Sacroiliac pain 08/19/2016  . Hx of pancreatitis 07/05/2015  . Hyperlipidemia associated with type 2 diabetes mellitus ([place]) 05/10/2014  . Obesity 05/10/2014  . Type II diabetes mellitus, uncontrolled ([place]) 03/15/2014  . Hypertriglyceridemia 03/15/2014  .  Tobacco abuse 02/06/2014  . OA (osteoarthritis) of knee 02/06/2014   Past Medical History:  Diagnosis Date  . Chronic ear infection   . Diabetes mellitus type II, controlled ([place])   . HTN (hypertension)   . Hyperlipidemia   . Pancreatitis, acute     Family History  Problem Relation Age of Onset  . Heart attack Father 17       Deceased  . Diabetes Father   . Diabetes Mother   . Hypertension Mother        Living  . Alzheimer's disease Maternal Grandfather   . Healthy Brother   . Healthy Daughter   . Diabetes Paternal Uncle     Past Surgical History:  Procedure Laterality Date  . KNEE ARTHROSCOPY Right   . UMBILICAL HERNIA REPAIR    . WISDOM TOOTH EXTRACTION     Social History    Occupational History  . Occupation: Chief Financial Officer  Tobacco Use  . Smoking status: Current Every Day Smoker    Types: Cigarettes  . Smokeless tobacco: Never Used  Substance and Sexual Activity  . Alcohol use: No    Alcohol/week: 0.0 standard drinks  . Drug use: No  . Sexual activity: Not on file

## 2019-03-30 ENCOUNTER — Other Ambulatory Visit: Payer: Self-pay | Admitting: Endocrinology

## 2019-04-02 ENCOUNTER — Other Ambulatory Visit: Payer: Self-pay

## 2019-04-02 ENCOUNTER — Encounter: Payer: Self-pay | Admitting: Endocrinology

## 2019-04-02 ENCOUNTER — Ambulatory Visit: Payer: BC Managed Care – PPO | Admitting: Endocrinology

## 2019-04-02 VITALS — BP 158/80 | HR 94 | Ht 64.0 in | Wt 213.0 lb

## 2019-04-02 DIAGNOSIS — M25531 Pain in right wrist: Secondary | ICD-10-CM

## 2019-04-02 DIAGNOSIS — E1165 Type 2 diabetes mellitus with hyperglycemia: Secondary | ICD-10-CM | POA: Diagnosis not present

## 2019-04-02 LAB — POCT GLYCOSYLATED HEMOGLOBIN (HGB A1C): Hemoglobin A1C: 10.8 % — AB (ref 4.0–5.6)

## 2019-04-02 MED ORDER — LANTUS SOLOSTAR 100 UNIT/ML ~~LOC~~ SOPN: 90.0000 [IU] | PEN_INJECTOR | SUBCUTANEOUS | 99 refills | Status: DC

## 2019-04-02 MED ORDER — OZEMPIC (0.25 OR 0.5 MG/DOSE) 2 MG/1.5ML ~~LOC~~ SOPN: 0.2500 mg | PEN_INJECTOR | SUBCUTANEOUS | 5 refills | Status: DC

## 2019-04-02 NOTE — Patient Instructions (Addendum)
Your blood pressure is high today.  Please see your primary care provider soon, to have it rechecked I have sent 2 prescriptions to your pharmacy, to change the insulin to Lantus, and to add "Ozempic."   check your blood sugar twice a day.  vary the time of day when you check, between before the 3 meals, and at bedtime.  also check if you have symptoms of your blood sugar being too high or too low.  please keep a record of the readings and bring it to your next appointment here (or you can bring the meter itself).  You can write it on any piece of paper.  please call us sooner if your blood sugar goes below 70, or if you have a lot of readings over 200. Please come back for a follow-up appointment in 2 months.

## 2019-04-02 NOTE — Progress Notes (Signed)
Subjective:    Patient ID: Teresa Parks, female    DOB: 1971-04-13, 48 y.o.   MRN: [id number]  HPI Pt returns for f/u of diabetes mellitus: DM type: Insulin-requiring type 2 Dx'ed: 123456 Complications: none Therapy: insulin since dx, and metformin GDM: never DKA: never Severe hypoglycemia: never Pancreatitis: once, in 2015 Pancreatic imaging: normal on 2016 [place] Other: she also has severe hypertriglyceridemia; she declines multiple daily injections.   Interval history: no cbg record, but states cbg's are in the 200's.  She never misses the insulin.   She takes 75 units qam.  She has been on prednisone for right wrist pain.  She has been off steroid rx x 1 week.   Past Medical History:  Diagnosis Date  . Chronic ear infection   . Diabetes mellitus type II, controlled ([place])   . HTN (hypertension)   . Hyperlipidemia   . Pancreatitis, acute     Past Surgical History:  Procedure Laterality Date  . KNEE ARTHROSCOPY Right   . UMBILICAL HERNIA REPAIR    . WISDOM TOOTH EXTRACTION      Social History   Socioeconomic History  . Marital status: Married    Spouse name: Not on file  . Number of children: 1  . Years of education: Not on file  . Highest education level: Not on file  Occupational History  . Occupation: Chief Financial Officer  Social Needs  . Financial resource strain: Not on file  . Food insecurity    Worry: Not on file    Inability: Not on file  . Transportation needs    Medical: Not on file    Non-medical: Not on file  Tobacco Use  . Smoking status: Current Every Day Smoker    Types: Cigarettes  . Smokeless tobacco: Never Used  Substance and Sexual Activity  . Alcohol use: No    Alcohol/week: 0.0 standard drinks  . Drug use: No  . Sexual activity: Not on file  Lifestyle  . Physical activity    Days per week: Not on file    Minutes per session: Not on file  . Stress: Not on file  Relationships  . Social Herbalist on phone: Not on file   Gets together: Not on file    Attends religious service: Not on file    Active member of club or organization: Not on file    Attends meetings of clubs or organizations: Not on file    Relationship status: Not on file  . Intimate partner violence    Fear of current or ex partner: Not on file    Emotionally abused: Not on file    Physically abused: Not on file    Forced sexual activity: Not on file  Other Topics Concern  . Not on file  Social History Narrative  . Not on file    Current Outpatient Medications on File Prior to Visit  Medication Sig Dispense Refill  . atorvastatin (LIPITOR) 40 MG tablet Take 1 tablet by mouth once daily 30 tablet 3  . cyclobenzaprine (FLEXERIL) 10 MG tablet TAKE 1 TABLET BY MOUTH ONCE DAILY AT BEDTIME 30 tablet 0  . fenofibrate 160 MG tablet Take 1 tablet by mouth once daily 30 tablet 3  . glucose blood test strip Use as instructed 100 each 12  . Lancets (FREESTYLE) lancets Use as instructed 100 each 12  . meloxicam (MOBIC) 15 MG tablet Take 1 tablet (15 mg total) by mouth daily. 15 tablet  0  . metFORMIN (GLUCOPHAGE-XR) 500 MG 24 hr tablet Take 4 tablets (2,000 mg total) by mouth daily. Further refills to come from [facility] 360 tablet 0  . olmesartan (BENICAR) 5 MG tablet Take 1 tablet by mouth once daily 30 tablet 3  . varenicline (CHANTIX STARTING MONTH PAK) 0.5 MG X 11 & 1 MG X 42 tablet Take one 0.5 mg tablet by mouth once daily for 3 days, then increase to one 0.5 mg tablet twice daily for 4 days, then increase to one 1 mg tablet twice daily. 53 tablet 0   No current facility-administered medications on file prior to visit.     Allergies  Allergen Reactions  . Novocain [Procaine] Nausea And Vomiting    Family History  Problem Relation Age of Onset  . Heart attack Father 51       Deceased  . Diabetes Father   . Diabetes Mother   . Hypertension Mother        Living  . Alzheimer's disease Maternal Grandfather   . Healthy Brother   . Healthy  Daughter   . Diabetes Paternal Uncle     BP (!) 158/80 (BP Location: Right Arm, Patient Position: Sitting, Cuff Size: Large)   Pulse 94   Ht 5\' 4"  (1.626 m)   Wt 213 lb (96.6 kg)   SpO2 97%   BMI 36.56 kg/m    Review of Systems She denies hypoglycemia.  She reports weight gain    Objective:   Physical Exam VITAL SIGNS:  See vs page GENERAL: no distress Pulses: dorsalis pedis intact bilat.   MSK: no deformity of the feet CV: no leg edema Skin:  no ulcer on the feet.  normal color and temp on the feet. Neuro: sensation is intact to touch on the feet Ext: there is bilateral onychomycosis of the toenails.    Lab Results  Component Value Date   HGBA1C 10.8 (A) 04/02/2019       Assessment & Plan:  Insulin-requiring type 2 DM: worse Wrist pain: steroid rx is affecting A1c.  HTN: is noted today.   Patient Instructions  Your blood pressure is high today.  Please see your primary care provider soon, to have it rechecked I have sent 2 prescriptions to your pharmacy, to change the insulin to Lantus, and to add "Ozempic."   check your blood sugar twice a day.  vary the time of day when you check, between before the 3 meals, and at bedtime.  also check if you have symptoms of your blood sugar being too high or too low.  please keep a record of the readings and bring it to your next appointment here (or you can bring the meter itself).  You can write it on any piece of paper.  please call us sooner if your blood sugar goes below 70, or if you have a lot of readings over 200. Please come back for a follow-up appointment in 2 months.

## 2019-04-03 ENCOUNTER — Telehealth: Payer: Self-pay

## 2019-04-03 ENCOUNTER — Other Ambulatory Visit: Payer: Self-pay

## 2019-04-03 DIAGNOSIS — E1165 Type 2 diabetes mellitus with hyperglycemia: Secondary | ICD-10-CM

## 2019-04-03 MED ORDER — PEN NEEDLES 30G X 8 MM MISC: 1.0000 | Freq: Every day | 0 refills | Status: DC

## 2019-04-03 MED ORDER — BASAGLAR KWIKPEN 100 UNIT/ML ~~LOC~~ SOPN: 90.0000 [IU] | PEN_INJECTOR | SUBCUTANEOUS | 2 refills | Status: DC

## 2019-04-03 NOTE — Telephone Encounter (Signed)
Basaglar is OK

## 2019-04-03 NOTE — Telephone Encounter (Signed)
Received notification from Cover My Meds that PA for Lantus Solostar has been denied. Following reasons provided: MUST HAVE TRIED AND FAILED LEVEMIR AND BASAGLAR. Routing to Dr. Loanne Drilling for further orders.  [name] Key: [id number] - PA Case [id number] - Rx #[id number] Need help? Call us at [phone] Outcome Deniedtoday Your PA request has been denied. Additional information will be provided in the denial communication. (Message 1140) Your PA request has been denied. Additional information will be provided in the denial communication. (Message 1140) Drug Lantus SoloStar 100UNIT/ML pen-injectors Form Charity fundraiser PA Form (NCPDP) Original Claim Info 75 +MUST USE BASAGLAR, LEVEMIR OR MED NECPA ONLY [id number] [place] HELP DESK [phone])

## 2019-04-03 NOTE — Telephone Encounter (Signed)
Pt returned call. Informed about denial and new orders. Verbalized acceptance and understanding.

## 2019-04-03 NOTE — Telephone Encounter (Signed)
PA initiated today through [facility] [employer] for Lantus Solostar. Will await insurance response re: approval/denial.  Teresa Parks (Key: [id number])  Your demographic data has been sent to the plan successfully. They will respond with your clinical questions and you will be notified by email when available within the next business day. You can also check for an update later by opening this request from your dashboard. Please do not fax or call the plan to resubmit this request.  If you need assistance, please chat with CoverMyMeds or call us at [phone].  Teresa Parks [employer] [employer] - Rx #[id number] Need help? Call us at [phone] Status Sent to [employer] Drug Lantus SoloStar 100UNIT/ML pen-injectors Form Charity fundraiser PA Form (NCPDP) Original Claim Info 75 +MUST USE Teresa Parks OR MED NECPA ONLY [id number] DRUG Teresa Parks HELP DESK [phone])

## 2019-04-03 NOTE — Telephone Encounter (Signed)
Insulin Glargine (BASAGLAR KWIKPEN) 100 UNIT/ML SOPN 27 mL 2 04/03/2019    Sig - Route: Inject 0.9 mLs (90 Units total) into the skin every morning. - Subcutaneous   Sent to pharmacy as: Insulin Glargine (BASAGLAR KWIKPEN) 100 UNIT/ML Solution Pen-injector   E-Prescribing Status: Receipt confirmed by pharmacy (04/03/2019 1:16 PM EDT)    Called pt to inform about denial and new Rx. LVM requesting returned call.

## 2019-04-06 ENCOUNTER — Encounter: Payer: Self-pay | Admitting: Orthopaedic Surgery

## 2019-04-16 ENCOUNTER — Ambulatory Visit (INDEPENDENT_AMBULATORY_CARE_PROVIDER_SITE_OTHER): Payer: BC Managed Care – PPO | Admitting: Orthopaedic Surgery

## 2019-04-16 ENCOUNTER — Other Ambulatory Visit: Payer: Self-pay

## 2019-04-16 ENCOUNTER — Encounter: Payer: Self-pay | Admitting: Orthopaedic Surgery

## 2019-04-16 DIAGNOSIS — M654 Radial styloid tenosynovitis [de Quervain]: Secondary | ICD-10-CM | POA: Diagnosis not present

## 2019-04-16 MED ORDER — BUPIVACAINE HCL 0.25 % IJ SOLN
0.3300 mL | INTRAMUSCULAR | Status: AC | PRN
  Administered 2019-04-16: .33 mL

## 2019-04-16 MED ORDER — LIDOCAINE HCL 1 % IJ SOLN
0.3000 mL | INTRAMUSCULAR | Status: AC | PRN
  Administered 2019-04-16: .3 mL

## 2019-04-16 MED ORDER — METHYLPREDNISOLONE ACETATE 40 MG/ML IJ SUSP
13.3300 mg | INTRAMUSCULAR | Status: AC | PRN
  Administered 2019-04-16: 09:00:00 13.33 mg

## 2019-04-16 NOTE — Progress Notes (Signed)
Office Visit Note   Patient: Teresa Parks           Date of Birth: [birth year]           MRN: [id number] Visit Date: 04/16/2019              Requested by: Brunetta Jeans, PA-C [address] [place],  [place] [address] PCP: Brunetta Jeans, PA-C   Assessment & Plan: Visit Diagnoses:  1. Tenosynovitis, de Quervain     Plan: Impression is left wrist de Quervain's tenosynovitis.  We injected this with cortisone today.  We will also provide her with a work note of no lifting greater than 5 pounds for the next 2 weeks.  We briefly discussed surgical intervention should the cortisone injection fail to relieve her symptoms.  She will follow-up with [place] as needed.  Follow-Up Instructions: Return if symptoms worsen or fail to improve.   Orders:  Orders Placed This Encounter  Procedures  . Hand/UE Inj: L extensor compartment 1   No orders of the defined types were placed in this encounter.     Procedures: Hand/UE Inj: L extensor compartment 1 for de Quervain's tenosynovitis on 04/16/2019 8:41 AM Medications: 0.33 mL bupivacaine 0.25 %; 0.3 mL lidocaine 1 %; 13.33 mg methylPREDNISolone acetate 40 MG/ML      Clinical Data: No additional findings.   Subjective: Chief Complaint  Patient presents with  . Left Wrist - Pain    HPI patient is a pleasant 48 year old right-hand-dominant female who presents our clinic today with recurrent left wrist pain.  This has been ongoing for about 2 months now.  No specific injury, but she does do a lot of repetitive and heavy lifting at [employer] where she works.  The pain she has is over the first dorsal compartment.  Worse with any movement of the wrist and thumb.  She has tried prednisone as well as a thumb spica splint without relief of symptoms.  She is requesting a cortisone injection today.  Review of Systems as detailed in HPI.  All others reviewed and are negative.   Objective: Vital Signs: There were no vitals taken for  this visit.  Physical Exam well-developed and well-nourished female in no acute distress.  Alert and oriented x3.  Ortho Exam examination of her left wrist reveals marked tenderness along the first dorsal compartment.  She also has swelling throughout the first, second and third dorsal compartments.  Positive Finkelstein.  She is neurovascular intact distally.  Specialty Comments:  No specialty comments available.  Imaging: No new imaging   PMFS History: Patient Active Problem List   Diagnosis Date Noted  . Right wrist pain 01/12/2019  . Acute left ankle pain 01/12/2019  . DJD (degenerative joint disease), lumbar 05/08/2018  . Apneic episode 01/13/2017  . Sacroiliac pain 08/19/2016  . Hx of pancreatitis 07/05/2015  . Hyperlipidemia associated with type 2 diabetes mellitus ([place]) 05/10/2014  . Obesity 05/10/2014  . Type II diabetes mellitus, uncontrolled ([place]) 03/15/2014  . Hypertriglyceridemia 03/15/2014  . Tobacco abuse 02/06/2014  . OA (osteoarthritis) of knee 02/06/2014   Past Medical History:  Diagnosis Date  . Chronic ear infection   . Diabetes mellitus type II, controlled ([place])   . HTN (hypertension)   . Hyperlipidemia   . Pancreatitis, acute     Family History  Problem Relation Age of Onset  . Heart attack Father 49       Deceased  . Diabetes Father   .  Diabetes Mother   . Hypertension Mother        Living  . Alzheimer's disease Maternal Grandfather   . Healthy Brother   . Healthy Daughter   . Diabetes Paternal Uncle     Past Surgical History:  Procedure Laterality Date  . KNEE ARTHROSCOPY Right   . UMBILICAL HERNIA REPAIR    . WISDOM TOOTH EXTRACTION     Social History   Occupational History  . Occupation: Chief Financial Officer  Tobacco Use  . Smoking status: Current Every Day Smoker    Types: Cigarettes  . Smokeless tobacco: Never Used  Substance and Sexual Activity  . Alcohol use: No    Alcohol/week: 0.0 standard drinks  . Drug use: No  .  Sexual activity: Not on file

## 2019-04-27 ENCOUNTER — Other Ambulatory Visit: Payer: Self-pay

## 2019-04-27 DIAGNOSIS — E1165 Type 2 diabetes mellitus with hyperglycemia: Secondary | ICD-10-CM

## 2019-04-27 MED ORDER — INSULIN PEN NEEDLE 31G X 8 MM MISC: 1.0000 | Freq: Every day | 0 refills | Status: DC

## 2019-05-06 ENCOUNTER — Encounter: Payer: Self-pay | Admitting: Orthopaedic Surgery

## 2019-05-11 ENCOUNTER — Encounter: Payer: Self-pay | Admitting: Orthopaedic Surgery

## 2019-05-18 ENCOUNTER — Other Ambulatory Visit: Payer: Self-pay | Admitting: Physician Assistant

## 2019-05-29 ENCOUNTER — Ambulatory Visit: Payer: BC Managed Care – PPO | Admitting: Endocrinology

## 2019-06-07 ENCOUNTER — Encounter: Payer: Self-pay | Admitting: Emergency Medicine

## 2019-06-07 ENCOUNTER — Emergency Department
Admission: EM | Admit: 2019-06-07 | Discharge: 2019-06-07 | Disposition: A | Discharge status: Home / Self Care | Payer: BC Managed Care – PPO | Source: Home / Self Care

## 2019-06-07 ENCOUNTER — Other Ambulatory Visit: Payer: Self-pay

## 2019-06-07 DIAGNOSIS — Z20828 Contact with and (suspected) exposure to other viral communicable diseases: Secondary | ICD-10-CM

## 2019-06-07 NOTE — Discharge Instructions (Signed)
° °  Due to concern for possibly having Covid-19, it is advised that you self-isolate at home until test results come back in 2-5 days.  If positive, it is recommended you stay isolated for at least 10 days after symptom onset and 24 hours after last fever without taking medication (whichever is longer), with improving symptoms.  If you MUST go out, please wear a mask at all times, limit contact with others.

## 2019-06-07 NOTE — ED Provider Notes (Signed)
[facility]    CSN: [id number] Arrival date & time: 06/07/19  1523      History   Chief Complaint Chief Complaint  Patient presents with  . covid exposure    HPI Teresa Parks is a 48 y.o. female.   HPI Teresa Parks is a 48 y.o. female presenting to UC with request for a Covid-19 test. Pt reports watching her grandson during the week while his parents work but this past week both parents got sick.  They tested for Covid on Wednesday, results came back positive yesterday. Pt denies having symptoms but notes her husband has COPD, she is concerned about giving him the virus.  Pt is also here with her grandson for testing. He had a fever last week but no symptoms since then.    Past Medical History:  Diagnosis Date  . Chronic ear infection   . Diabetes mellitus type II, controlled ([place])   . HTN (hypertension)   . Hyperlipidemia   . Pancreatitis, acute     Patient Active Problem List   Diagnosis Date Noted  . Right wrist pain 01/12/2019  . Acute left ankle pain 01/12/2019  . DJD (degenerative joint disease), lumbar 05/08/2018  . Apneic episode 01/13/2017  . Sacroiliac pain 08/19/2016  . Hx of pancreatitis 07/05/2015  . Hyperlipidemia associated with type 2 diabetes mellitus ([place]) 05/10/2014  . Obesity 05/10/2014  . Type II diabetes mellitus, uncontrolled ([place]) 03/15/2014  . Hypertriglyceridemia 03/15/2014  . Tobacco abuse 02/06/2014  . OA (osteoarthritis) of knee 02/06/2014    Past Surgical History:  Procedure Laterality Date  . KNEE ARTHROSCOPY Right   . UMBILICAL HERNIA REPAIR    . WISDOM TOOTH EXTRACTION      OB History   No obstetric history on file.      Home Medications    Prior to Admission medications   Medication Sig Start Date End Date Taking? Authorizing Provider  atorvastatin (LIPITOR) 40 MG tablet Take 1 tablet by mouth once daily 02/09/19   Brunetta Jeans, PA-C  cyclobenzaprine (FLEXERIL) 10 MG tablet TAKE 1 TABLET BY MOUTH  ONCE DAILY AT BEDTIME 05/18/19   Brunetta Jeans, PA-C  fenofibrate 160 MG tablet Take 1 tablet by mouth once daily 02/09/19   [name] C, PA-C  glucose blood test strip Use as instructed 02/08/14   Viyuoh, Alison Stalling, MD  Insulin Glargine (BASAGLAR KWIKPEN) 100 UNIT/ML SOPN Inject 0.9 mLs (90 Units total) into the skin every morning. 04/03/19   Renato Shin, MD  Insulin Pen Needle 31G X 8 MM MISC 1 each by Does not apply route daily. Use to inject insulin daily; E11.65 04/27/19   Renato Shin, MD  Lancets (FREESTYLE) lancets Use as instructed 02/08/14   Viyuoh, Alison Stalling, MD  meloxicam (MOBIC) 15 MG tablet Take 1 tablet (15 mg total) by mouth daily. 02/20/19   Brunetta Jeans, PA-C  metFORMIN (GLUCOPHAGE-XR) 500 MG 24 hr tablet Take 4 tablets (2,000 mg total) by mouth daily. Further refills to come from Endo 02/20/19   Brunetta Jeans, PA-C  olmesartan [facility]) 5 MG tablet Take 1 tablet by mouth once daily 02/09/19   Brunetta Jeans, PA-C  Semaglutide,0.25 or 0.5MG /DOS, (OZEMPIC, 0.25 OR 0.5 MG/DOSE,) 2 MG/1.5ML SOPN Inject 0.25 mg into the skin once a week. 04/02/19   Renato Shin, MD  varenicline (CHANTIX STARTING MONTH PAK) 0.5 MG X 11 & 1 MG X 42 tablet Take one 0.5 mg tablet by mouth once  daily for 3 days, then increase to one 0.5 mg tablet twice daily for 4 days, then increase to one 1 mg tablet twice daily. 01/04/19   Brunetta Jeans, PA-C  cyclobenzaprine (FLEXERIL) 10 MG tablet TAKE 1 TABLET BY MOUTH ONCE DAILY AT BEDTIME 03/02/19   Brunetta Jeans, PA-C    Family History Family History  Problem Relation Age of Onset  . Heart attack Father 74       Deceased  . Diabetes Father   . Diabetes Mother   . Hypertension Mother        Living  . Alzheimer's disease Maternal Grandfather   . Healthy Brother   . Healthy Daughter   . Diabetes Paternal Uncle     Social History Social History   Tobacco Use  . Smoking status: Current Every Day Smoker    Types: Cigarettes  .  Smokeless tobacco: Never Used  Substance Use Topics  . Alcohol use: No    Alcohol/week: 0.0 standard drinks  . Drug use: No     Allergies   Novocain [procaine]   Review of Systems Review of Systems  Constitutional: Negative for chills and fever.  HENT: Negative for congestion, ear pain, sore throat, trouble swallowing and voice change.   Respiratory: Negative for cough and shortness of breath.   Cardiovascular: Negative for chest pain and palpitations.  Gastrointestinal: Negative for abdominal pain, diarrhea, nausea and vomiting.  Musculoskeletal: Negative for arthralgias, back pain and myalgias.  Skin: Negative for rash.  Neurological: Negative for dizziness and headaches.     Physical Exam Triage Vital Signs ED Triage Vitals [06/07/19 1556]  Enc Vitals Group     BP (!) 144/95     Pulse Rate (!) 110     Resp 18     Temp 97.9 F (36.6 C)     Temp Source Oral     SpO2 96 %     Weight 205 lb (93 kg)     Height 5\' 4"  (1.626 m)     Head Circumference      Peak Flow      Pain Score 0     Pain Loc      Pain Edu?      Excl. in [place]?    No data found.  Updated Vital Signs BP (!) 144/95 (BP Location: Right Arm)   Pulse (!) 110   Temp 97.9 F (36.6 C) (Oral)   Resp 18   Ht 5\' 4"  (1.626 m)   Wt 205 lb (93 kg)   LMP 05/21/2019   SpO2 96%   BMI 35.19 kg/m   Visual Acuity Right Eye Distance:   Left Eye Distance:   Bilateral Distance:    Right Eye Near:   Left Eye Near:    Bilateral Near:     Physical Exam Vitals signs and nursing note reviewed.  Constitutional:      Appearance: Normal appearance. She is well-developed.  HENT:     Head: Normocephalic and atraumatic.     Right Ear: Tympanic membrane and ear canal normal.     Left Ear: Tympanic membrane and ear canal normal.     Nose: Nose normal.     Right Sinus: No maxillary sinus tenderness or frontal sinus tenderness.     Left Sinus: No maxillary sinus tenderness or frontal sinus tenderness.      Mouth/Throat:     Lips: Pink.     Mouth: Mucous membranes are moist.     Pharynx:  Oropharynx is clear. Uvula midline.  Neck:     Musculoskeletal: Normal range of motion.  Cardiovascular:     Rate and Rhythm: Regular rhythm. Tachycardia present.  Pulmonary:     Effort: Pulmonary effort is normal. No respiratory distress.     Breath sounds: Normal breath sounds. No stridor. No wheezing, rhonchi or rales.  Musculoskeletal: Normal range of motion.  Skin:    General: Skin is warm and dry.  Neurological:     Mental Status: She is alert and oriented to person, place, and time.  Psychiatric:        Behavior: Behavior normal.      UC Treatments / Results  Labs (all labs ordered are listed, but only abnormal results are displayed) Labs Reviewed  NOVEL CORONAVIRUS, NAA    EKG   Radiology No results found.  Procedures Procedures (including critical care time)  Medications Ordered in UC Medications - No data to display  Initial Impression / Assessment and Plan / UC Course  I have reviewed the triage vital signs and the nursing notes.  Pertinent labs & imaging results that were available during my care of the patient were reviewed by me and considered in my medical decision making (see chart for details).     Normal exam Send out Covid test: pending AVS provided  Final Clinical Impressions(s) / UC Diagnoses   Final diagnoses:  Exposure to SARS virus     Discharge Instructions       Due to concern for possibly having Covid-19, it is advised that you self-isolate at home until test results come back in 2-5 days.  If positive, it is recommended you stay isolated for at least 10 days after symptom onset and 24 hours after last fever without taking medication (whichever is longer), with improving symptoms.  If you MUST go out, please wear a mask at all times, limit contact with others.      ED Prescriptions    None     PDMP not reviewed this encounter.   Noe Gens, [place] 06/08/19 [phone]

## 2019-06-07 NOTE — ED Triage Notes (Signed)
Patient has been exposed to daughter and son-in law who both tested covid positive 5 days ago; she has no symptoms but has husband with  COPD and worries about his potential illness.She is here with grandson who also needs testing.  She had influenza vacc this season.

## 2019-06-09 LAB — NOVEL CORONAVIRUS, NAA: SARS-CoV-2, NAA: NOT DETECTED

## 2019-06-14 ENCOUNTER — Encounter: Payer: Self-pay | Admitting: Physician Assistant

## 2019-06-15 ENCOUNTER — Other Ambulatory Visit: Payer: Self-pay

## 2019-06-15 DIAGNOSIS — Z20822 Contact with and (suspected) exposure to covid-19: Secondary | ICD-10-CM

## 2019-06-17 LAB — NOVEL CORONAVIRUS, NAA: SARS-CoV-2, NAA: NOT DETECTED

## 2019-06-18 ENCOUNTER — Ambulatory Visit: Payer: BC Managed Care – PPO | Admitting: Endocrinology

## 2019-07-20 ENCOUNTER — Other Ambulatory Visit: Payer: Self-pay

## 2019-07-20 ENCOUNTER — Encounter: Payer: Self-pay | Admitting: Endocrinology

## 2019-07-20 ENCOUNTER — Ambulatory Visit (INDEPENDENT_AMBULATORY_CARE_PROVIDER_SITE_OTHER): Payer: BC Managed Care – PPO | Admitting: Endocrinology

## 2019-07-20 VITALS — BP 164/86 | HR 105 | Ht 64.0 in | Wt 210.8 lb

## 2019-07-20 DIAGNOSIS — E1165 Type 2 diabetes mellitus with hyperglycemia: Secondary | ICD-10-CM | POA: Diagnosis not present

## 2019-07-20 LAB — POCT GLYCOSYLATED HEMOGLOBIN (HGB A1C): Hemoglobin A1C: 9.6 % — AB (ref 4.0–5.6)

## 2019-07-20 MED ORDER — OZEMPIC (0.25 OR 0.5 MG/DOSE) 2 MG/1.5ML ~~LOC~~ SOPN: 0.5000 mg | PEN_INJECTOR | SUBCUTANEOUS | 5 refills | Status: DC

## 2019-07-20 NOTE — Progress Notes (Signed)
Subjective:    Patient ID: Teresa Parks, female    DOB: February 01, 1971, 49 y.o.   MRN: [id number]  HPI Pt returns for f/u of diabetes mellitus: DM type: Insulin-requiring type 2 Dx'ed: 123456 Complications: none Therapy: insulin since dx, Ozempic, and metformin GDM: never DKA: never Severe hypoglycemia: never Pancreatitis: once, in 2015 Pancreatic imaging: normal on 2016 [place] Other: she also has severe hypertriglyceridemia; she declines multiple daily injections.   Interval history: no cbg record, but states cbg's vary from 180-230.  She sometimes misses the insulin.  She says she can afford it, but she misses due to being busy.  No recent steroids.    Past Medical History:  Diagnosis Date  . Chronic ear infection   . Diabetes mellitus type II, controlled ([place])   . HTN (hypertension)   . Hyperlipidemia   . Pancreatitis, acute     Past Surgical History:  Procedure Laterality Date  . KNEE ARTHROSCOPY Right   . UMBILICAL HERNIA REPAIR    . WISDOM TOOTH EXTRACTION      Social History   Socioeconomic History  . Marital status: Married    Spouse name: Not on file  . Number of children: 1  . Years of education: Not on file  . Highest education level: Not on file  Occupational History  . Occupation: Chief Financial Officer  Tobacco Use  . Smoking status: Current Every Day Smoker    Types: Cigarettes  . Smokeless tobacco: Never Used  Substance and Sexual Activity  . Alcohol use: No    Alcohol/week: 0.0 standard drinks  . Drug use: No  . Sexual activity: Not on file  Other Topics Concern  . Not on file  Social History Narrative  . Not on file   Social Determinants of Health   Financial Resource Strain:   . Difficulty of Paying Living Expenses: Not on file  Food Insecurity:   . Worried About Charity fundraiser in the Last Year: Not on file  . Ran Out of Food in the Last Year: Not on file  Transportation Needs:   . Lack of Transportation (Medical): Not on file  . Lack  of Transportation (Non-Medical): Not on file  Physical Activity:   . Days of Exercise per Week: Not on file  . Minutes of Exercise per Session: Not on file  Stress:   . Feeling of Stress : Not on file  Social Connections:   . Frequency of Communication with Friends and Family: Not on file  . Frequency of Social Gatherings with Friends and Family: Not on file  . Attends Religious Services: Not on file  . Active Member of Clubs or Organizations: Not on file  . Attends Archivist Meetings: Not on file  . Marital Status: Not on file  Intimate Partner Violence:   . Fear of Current or Ex-Partner: Not on file  . Emotionally Abused: Not on file  . Physically Abused: Not on file  . Sexually Abused: Not on file    Current Outpatient Medications on File Prior to Visit  Medication Sig Dispense Refill  . atorvastatin (LIPITOR) 40 MG tablet Take 1 tablet by mouth once daily 30 tablet 3  . cyclobenzaprine (FLEXERIL) 10 MG tablet TAKE 1 TABLET BY MOUTH ONCE DAILY AT BEDTIME 30 tablet 0  . fenofibrate 160 MG tablet Take 1 tablet by mouth once daily 30 tablet 3  . glucose blood test strip Use as instructed 100 each 12  . Insulin Glargine (BASAGLAR  KWIKPEN) 100 UNIT/ML SOPN Inject 0.9 mLs (90 Units total) into the skin every morning. 27 mL 2  . Insulin Pen Needle 31G X 8 MM MISC 1 each by Does not apply route daily. Use to inject insulin daily; E11.65 90 each 0  . Lancets (FREESTYLE) lancets Use as instructed 100 each 12  . meloxicam (MOBIC) 15 MG tablet Take 1 tablet (15 mg total) by mouth daily. 15 tablet 0  . metFORMIN (GLUCOPHAGE-XR) 500 MG 24 hr tablet Take 4 tablets (2,000 mg total) by mouth daily. Further refills to come from Endo 360 tablet 0  . olmesartan (BENICAR) 5 MG tablet Take 1 tablet by mouth once daily 30 tablet 3  . varenicline (CHANTIX STARTING MONTH PAK) 0.5 MG X 11 & 1 MG X 42 tablet Take one 0.5 mg tablet by mouth once daily for 3 days, then increase to one 0.5 mg tablet  twice daily for 4 days, then increase to one 1 mg tablet twice daily. 53 tablet 0  . [DISCONTINUED] cyclobenzaprine (FLEXERIL) 10 MG tablet TAKE 1 TABLET BY MOUTH ONCE DAILY AT BEDTIME 30 tablet 0   No current facility-administered medications on file prior to visit.    Allergies  Allergen Reactions  . Novocain [Procaine] Nausea And Vomiting    Family History  Problem Relation Age of Onset  . Heart attack Father 24       Deceased  . Diabetes Father   . Diabetes Mother   . Hypertension Mother        Living  . Alzheimer's disease Maternal Grandfather   . Healthy Brother   . Healthy Daughter   . Diabetes Paternal Uncle     BP (!) 164/86 (BP Location: Right Arm, Patient Position: Sitting, Cuff Size: Large)   Pulse (!) 105   Ht 5\' 4"  (1.626 m)   Wt 210 lb 12.8 oz (95.6 kg)   SpO2 98%   BMI 36.18 kg/m    Review of Systems She denies hypoglycemia and nausea    Objective:   Physical Exam VITAL SIGNS:  See vs page GENERAL: no distress Pulses: dorsalis pedis intact bilat.   MSK: no deformity of the feet CV: no leg edema Skin:  no ulcer on the feet, but there are heavy calluses.  normal color and temp on the feet. Neuro: sensation is intact to touch on the feet Ext: there is bilateral onychomycosis of the toenails.      Lab Results  Component Value Date   HGBA1C 9.6 (A) 07/20/2019       Assessment & Plan:  HTN: is noted today Insulin-requiring type 2 DM: she needs increased rx Noncompliance with insulin: she should take QD insulin  Patient Instructions  Your blood pressure is high today.  Please see your primary care provider soon, to have it rechecked I have sent a prescription to your pharmacy, to increase the Ozempic.  Please continue the same other diabetes medications.  check your blood sugar twice a day.  vary the time of day when you check, between before the 3 meals, and at bedtime.  also check if you have symptoms of your blood sugar being too high or  too low.  please keep a record of the readings and bring it to your next appointment here (or you can bring the meter itself).  You can write it on any piece of paper.  please call us sooner if your blood sugar goes below 70, or if you have a lot of readings  over 200. Please come back for a follow-up appointment in 2 months.

## 2019-07-20 NOTE — Patient Instructions (Addendum)
Your blood pressure is high today.  Please see your primary care provider soon, to have it rechecked I have sent a prescription to your pharmacy, to increase the Ozempic.  Please continue the same other diabetes medications.  check your blood sugar twice a day.  vary the time of day when you check, between before the 3 meals, and at bedtime.  also check if you have symptoms of your blood sugar being too high or too low.  please keep a record of the readings and bring it to your next appointment here (or you can bring the meter itself).  You can write it on any piece of paper.  please call us sooner if your blood sugar goes below 70, or if you have a lot of readings over 200. Please come back for a follow-up appointment in 2 months.

## 2019-08-25 DIAGNOSIS — N926 Irregular menstruation, unspecified: Secondary | ICD-10-CM | POA: Diagnosis not present

## 2019-09-21 ENCOUNTER — Ambulatory Visit: Payer: BC Managed Care – PPO | Admitting: Endocrinology

## 2019-09-21 ENCOUNTER — Other Ambulatory Visit: Payer: Self-pay

## 2019-09-21 ENCOUNTER — Telehealth: Payer: Self-pay

## 2019-09-21 ENCOUNTER — Encounter: Payer: Self-pay | Admitting: Endocrinology

## 2019-09-21 VITALS — BP 136/80 | HR 95 | Ht 64.0 in | Wt 210.4 lb

## 2019-09-21 DIAGNOSIS — E1165 Type 2 diabetes mellitus with hyperglycemia: Secondary | ICD-10-CM | POA: Diagnosis not present

## 2019-09-21 LAB — POCT GLYCOSYLATED HEMOGLOBIN (HGB A1C): Hemoglobin A1C: 10.5 % — AB (ref 4.0–5.6)

## 2019-09-21 MED ORDER — TRESIBA FLEXTOUCH 200 UNIT/ML ~~LOC~~ SOPN: 100.0000 [IU] | PEN_INJECTOR | Freq: Every day | SUBCUTANEOUS | 11 refills | Status: DC

## 2019-09-21 NOTE — Telephone Encounter (Signed)
APPROVAL  Medication: [place]: [employer] PA response: APPROVED  Teresa Parks (Key: [id number]) Rx #: [id number] [name] FlexTouch (insulin degludec injection) 200 Units/mL solution   Form Charity fundraiser PA Form (NCPDP) Created 23 minutes ago Sent to Plan 3 minutes ago Plan Response 3 minutes ago Submit Clinical Questions Determination N/A Message from Plan Your PA has been resolved, no additional PA is required. For further inquiries please contact the number on the back of the member prescription card. (Message 1005)  PRIOR AUTHORIZATION  PA initiation date: 09/21/19  Medication: [place]: [employer] completed electronically through [employer] My Meds: Yes  Will await insurance response re: approval/denial.  Teresa Parks (Key: [id number])  [employer] is processing your PA request and will respond shortly with next steps. You are currently using the fastest method to process this prior authorization. Please do not fax or call [employer] to resubmit this request. To check for an update later, open this request again from your dashboard.

## 2019-09-21 NOTE — Progress Notes (Signed)
Subjective:    Patient ID: Teresa Parks, female    DOB: April 19, 1971, 49 y.o.   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   Interval history: no cbg record, but states cbg's vary from 140-230.  She sometimes misses the insulin, despite being able to afford it.  No recent steroids.   Past Medical History:  Diagnosis Date  . Chronic ear infection   . Diabetes mellitus type II, controlled ([place])   . HTN (hypertension)   . Hyperlipidemia   . Pancreatitis, acute     Past Surgical History:  Procedure Laterality Date  . KNEE ARTHROSCOPY Right   . UMBILICAL HERNIA REPAIR    . WISDOM TOOTH EXTRACTION      Social History   Socioeconomic History  . Marital status: Married    Spouse name: Not on file  . Number of children: 1  . Years of education: Not on file  . Highest education level: Not on file  Occupational History  . Occupation: Chief Financial Officer  Tobacco Use  . Smoking status: Current Every Day Smoker    Types: Cigarettes  . Smokeless tobacco: Never Used  Substance and Sexual Activity  . Alcohol use: No    Alcohol/week: 0.0 standard drinks  . Drug use: No  . Sexual activity: Not on file  Other Topics Concern  . Not on file  Social History Narrative  . Not on file   Social Determinants of Health   Financial Resource Strain:   . Difficulty of Paying Living Expenses: Not on file  Food Insecurity:   . Worried About Charity fundraiser in the Last Year: Not on file  . Ran Out of Food in the Last Year: Not on file  Transportation Needs:   . Lack of Transportation (Medical): Not on file  . Lack of  Transportation (Non-Medical): Not on file  Physical Activity:   . Days of Exercise per Week: Not on file  . Minutes of Exercise per Session: Not on file  Stress:   . Feeling of Stress : Not on file  Social Connections:   . Frequency of Communication with Friends and Family: Not on file  . Frequency of Social Gatherings with Friends and Family: Not on file  . Attends Religious Services: Not on file  . Active Member of Clubs or Organizations: Not on file  . Attends Archivist Meetings: Not on file  . Marital Status: Not on file  Intimate Partner Violence:   . Fear of Current or Ex-Partner: Not on file  . Emotionally Abused: Not on file  . Physically Abused: Not on file  . Sexually Abused: Not on file    Current Outpatient Medications on File Prior to Visit  Medication Sig Dispense Refill  . atorvastatin (LIPITOR) 40 MG tablet Take 1 tablet by mouth once daily 30 tablet 3  . cyclobenzaprine (FLEXERIL) 10 MG tablet TAKE 1 TABLET BY MOUTH ONCE DAILY AT BEDTIME 30 tablet 0  . fenofibrate 160 MG tablet Take 1 tablet by mouth once daily 30 tablet 3  . glucose blood test strip Use as instructed 100 each 12  . Insulin Pen Needle 31G X 8 MM  MISC 1 each by Does not apply route daily. Use to inject insulin daily; E11.65 90 each 0  . Lancets (FREESTYLE) lancets Use as instructed 100 each 12  . meloxicam (MOBIC) 15 MG tablet Take 1 tablet (15 mg total) by mouth daily. 15 tablet 0  . metFORMIN (GLUCOPHAGE-XR) 500 MG 24 hr tablet Take 4 tablets (2,000 mg total) by mouth daily. Further refills to come from Endo (Patient taking differently: Take 1,500 mg by mouth daily. ) 360 tablet 0  . olmesartan (BENICAR) 5 MG tablet Take 1 tablet by mouth once daily 30 tablet 3  . Semaglutide,0.25 or 0.5MG /DOS, (OZEMPIC, 0.25 OR 0.5 MG/DOSE,) 2 MG/1.5ML SOPN Inject 0.5 mg into the skin once a week. 2 pen 5  . varenicline (CHANTIX STARTING MONTH PAK) 0.5 MG X 11 & 1 MG X 42 tablet Take one 0.5 mg tablet by  mouth once daily for 3 days, then increase to one 0.5 mg tablet twice daily for 4 days, then increase to one 1 mg tablet twice daily. 53 tablet 0  . [DISCONTINUED] cyclobenzaprine (FLEXERIL) 10 MG tablet TAKE 1 TABLET BY MOUTH ONCE DAILY AT BEDTIME 30 tablet 0   No current facility-administered medications on file prior to visit.    Allergies  Allergen Reactions  . Novocain [Procaine] Nausea And Vomiting    Family History  Problem Relation Age of Onset  . Heart attack Father 62       Deceased  . Diabetes Father   . Diabetes Mother   . Hypertension Mother        Living  . Alzheimer's disease Maternal Grandfather   . Healthy Brother   . Healthy Daughter   . Diabetes Paternal Uncle     BP 136/80 (BP Location: Left Arm, Patient Position: Sitting, Cuff Size: Large)   Pulse 95   Ht 5\' 4"  (1.626 m)   Wt 210 lb 6.4 oz (95.4 kg)   SpO2 97%   BMI 36.12 kg/m    Review of Systems She denies hypoglycemia    Objective:   Physical Exam VITAL SIGNS:  See vs page GENERAL: no distress Pulses: dorsalis pedis intact bilat.   MSK: no deformity of the feet CV: no leg edema Skin:  no ulcer on the feet.  normal color and temp on the feet. 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   Patient Instructions  I have sent a prescription to your pharmacy, to change to Tresiba, 100 units per day.  This is a slow time-release insulin.  This way, if you miss it, you can make it up later. check your blood sugar twice a day.  vary the time of day when you check, between before the 3 meals, and at bedtime.  also check if you have symptoms of your blood sugar being too high or too low.  please keep a record of the readings and bring it  to your next appointment here (or you can bring the meter itself).  You can write it on any piece of paper.  please call us sooner if your blood sugar goes below 70, or if you have a lot of readings over 200. Please come back for a follow-up appointment in  2 months.

## 2019-09-21 NOTE — Patient Instructions (Signed)
I have sent a prescription to your pharmacy, to change to Tresiba, 100 units per day.  This is a slow time-release insulin.  This way, if you miss it, you can make it up later. check your blood sugar twice a day.  vary the time of day when you check, between before the 3 meals, and at bedtime.  also check if you have symptoms of your blood sugar being too high or too low.  please keep a record of the readings and bring it to your next appointment here (or you can bring the meter itself).  You can write it on any piece of paper.  please call us sooner if your blood sugar goes below 70, or if you have a lot of readings over 200. Please come back for a follow-up appointment in 2 months.

## 2019-10-10 ENCOUNTER — Encounter: Payer: Self-pay | Admitting: Physician Assistant

## 2019-11-29 ENCOUNTER — Other Ambulatory Visit: Payer: Self-pay | Admitting: Physician Assistant

## 2019-11-30 MED ORDER — CYCLOBENZAPRINE HCL 10 MG PO TABS: 10.0000 mg | ORAL_TABLET | Freq: Every day | ORAL | 0 refills | Status: DC

## 2019-11-30 NOTE — Telephone Encounter (Signed)
Cyclobenzaprine LFD 05/18/19 #30 with 0 refills LOV 09/21/19 NOV none

## 2019-12-14 ENCOUNTER — Encounter: Payer: Self-pay | Admitting: Physician Assistant

## 2019-12-15 NOTE — Telephone Encounter (Signed)
Giving chronicity of pain, x-ray may be of benefit. May also need PT or more detailed imaging but need to discuss further. Please contact patient to schedule visit. Can be video if needed.

## 2020-02-18 DIAGNOSIS — L820 Inflamed seborrheic keratosis: Secondary | ICD-10-CM | POA: Diagnosis not present

## 2020-02-18 DIAGNOSIS — D485 Neoplasm of uncertain behavior of skin: Secondary | ICD-10-CM | POA: Diagnosis not present

## 2020-03-10 ENCOUNTER — Encounter: Payer: Self-pay | Admitting: Physician Assistant

## 2020-03-10 NOTE — Telephone Encounter (Signed)
She needs appointment to discuss -- can be video -- this way we can proceed with imaging and referral to specialist if needed.

## 2020-04-14 ENCOUNTER — Encounter: Payer: Self-pay | Admitting: Physician Assistant

## 2020-04-14 ENCOUNTER — Telehealth (INDEPENDENT_AMBULATORY_CARE_PROVIDER_SITE_OTHER): Payer: BC Managed Care – PPO | Admitting: Physician Assistant

## 2020-04-14 DIAGNOSIS — Z20822 Contact with and (suspected) exposure to covid-19: Secondary | ICD-10-CM | POA: Diagnosis not present

## 2020-04-14 NOTE — Progress Notes (Signed)
Virtual Visit via Video   I connected with patient on 04/14/20 at 11:00 AM EDT by a video enabled telemedicine application and verified that I am speaking with the correct person using two identifiers.  Location patient: Home Location provider: [facility] Persons participating in the virtual visit: Patient, Provider, [place] (Patina Moore)  I discussed the limitations of evaluation and management by telemedicine and the availability of in person appointments. The patient expressed understanding and agreed to proceed.  Subjective:   HPI:   Patient presents via [facility] today complaining of 4 days of sudden onset sinus congestion, sinus pressure, chills, fever (transient).  Denies body aches, chest tightness or shortness of breath.  Notes cough but denies major chest congestion.  Notes loss of smell but still able to taste slightly.  Denies ear pain or tooth pain.  Denies any recent travel or sick contact at home.  Notes potential exposure to Covid through work and family contacts.  No one else sick at home.  Has taken Sudafed but does not feel it is helping much.  Has not taken anything else for symptoms.  ROS:   See pertinent positives and negatives per HPI.  Patient Active Problem List   Diagnosis Date Noted  . Right wrist pain 01/12/2019  . Acute left ankle pain 01/12/2019  . DJD (degenerative joint disease), lumbar 05/08/2018  . Apneic episode 01/13/2017  . Sacroiliac pain 08/19/2016  . Hx of pancreatitis 07/05/2015  . Hyperlipidemia associated with type 2 diabetes mellitus ([place]) 05/10/2014  . Obesity 05/10/2014  . Type II diabetes mellitus, uncontrolled ([place]) 03/15/2014  . Hypertriglyceridemia 03/15/2014  . Tobacco abuse 02/06/2014  . OA (osteoarthritis) of knee 02/06/2014    Social History   Tobacco Use  . Smoking status: Current Every Day Smoker    Types: Cigarettes  . Smokeless tobacco: Never Used  Substance Use Topics  . Alcohol use: No     Alcohol/week: 0.0 standard drinks    Current Outpatient Medications:  .  atorvastatin (LIPITOR) 40 MG tablet, Take 1 tablet by mouth once daily, Disp: 30 tablet, Rfl: 3 .  cyclobenzaprine (FLEXERIL) 10 MG tablet, Take 1 tablet (10 mg total) by mouth at bedtime., Disp: 30 tablet, Rfl: 0 .  fenofibrate 160 MG tablet, Take 1 tablet by mouth once daily, Disp: 30 tablet, Rfl: 3 .  glucose blood test strip, Use as instructed, Disp: 100 each, Rfl: 12 .  insulin degludec (TRESIBA FLEXTOUCH) 200 UNIT/ML FlexTouch Pen, Inject 100 Units into the skin daily. And pen needles 1/day, Disp: 6 pen, Rfl: 11 .  Insulin Pen Needle 31G X 8 MM MISC, 1 each by Does not apply route daily. Use to inject insulin daily; E11.65, Disp: 90 each, Rfl: 0 .  Lancets (FREESTYLE) lancets, Use as instructed, Disp: 100 each, Rfl: 12 .  meloxicam (MOBIC) 15 MG tablet, Take 1 tablet (15 mg total) by mouth daily., Disp: 15 tablet, Rfl: 0 .  metFORMIN (GLUCOPHAGE-XR) 500 MG 24 hr tablet, Take 4 tablets (2,000 mg total) by mouth daily. Further refills to come from Endo (Patient taking differently: Take 1,500 mg by mouth daily. ), Disp: 360 tablet, Rfl: 0 .  olmesartan (BENICAR) 5 MG tablet, Take 1 tablet by mouth once daily, Disp: 30 tablet, Rfl: 3 .  Semaglutide,0.25 or 0.5MG /DOS, (OZEMPIC, 0.25 OR 0.5 MG/DOSE,) 2 MG/1.5ML SOPN, Inject 0.5 mg into the skin once a week., Disp: 2 pen, Rfl: 5 .  varenicline (CHANTIX STARTING MONTH PAK) 0.5 MG X 11 &  1 MG X 42 tablet, Take one 0.5 mg tablet by mouth once daily for 3 days, then increase to one 0.5 mg tablet twice daily for 4 days, then increase to one 1 mg tablet twice daily., Disp: 53 tablet, Rfl: 0  Allergies  Allergen Reactions  . Novocain [Procaine] Nausea And Vomiting    Objective:   There were no vitals taken for this visit.  Patient is well-developed, well-nourished in no acute distress.  Resting comfortably at home.  Head is normocephalic, atraumatic.  No labored breathing.    Speech is clear and coherent with logical content.  Patient is alert and oriented at baseline.   Assessment and Plan:   1. Suspected COVID-19 virus infection Concern for Covid giving her current symptoms.  She has been sent for testing.  She has been instructed to quarantine until test results are in.  We will have her increase fluids and get plenty of rest.  Start Mucinex DM OTC.  She is to start 1000 mg vitamin C daily, 1000 units of vitamin D3 daily and an over-the-counter zinc supplement.  Other supportive measures and OTC medications reviewed.  Patient has been rolled into the Covid MyChart monitoring program.  Strict ER precautions reviewed with patient.  Will alter regimen based on Covid results.  If positive she would be a candidate for monoclonal antibody infusion.    [name], PA-C 04/14/2020

## 2020-04-14 NOTE — Patient Instructions (Signed)
Instructions sent to patients MyChart.

## 2020-04-15 DIAGNOSIS — Z20822 Contact with and (suspected) exposure to covid-19: Secondary | ICD-10-CM | POA: Diagnosis not present

## 2020-04-18 ENCOUNTER — Encounter (INDEPENDENT_AMBULATORY_CARE_PROVIDER_SITE_OTHER): Payer: Self-pay

## 2020-04-18 ENCOUNTER — Encounter: Payer: Self-pay | Admitting: Physician Assistant

## 2020-04-19 ENCOUNTER — Encounter (INDEPENDENT_AMBULATORY_CARE_PROVIDER_SITE_OTHER): Payer: Self-pay

## 2020-04-22 ENCOUNTER — Ambulatory Visit: Payer: BC Managed Care – PPO | Admitting: Physician Assistant

## 2020-05-02 NOTE — Telephone Encounter (Signed)
She needs to schedule appointment.

## 2020-05-05 ENCOUNTER — Ambulatory Visit: Payer: Self-pay

## 2020-05-05 ENCOUNTER — Other Ambulatory Visit: Payer: Self-pay

## 2020-05-05 ENCOUNTER — Ambulatory Visit: Payer: BC Managed Care – PPO | Admitting: Family Medicine

## 2020-05-05 ENCOUNTER — Encounter: Payer: Self-pay | Admitting: Family Medicine

## 2020-05-05 ENCOUNTER — Ambulatory Visit: Payer: BC Managed Care – PPO | Admitting: Physician Assistant

## 2020-05-05 ENCOUNTER — Ambulatory Visit (INDEPENDENT_AMBULATORY_CARE_PROVIDER_SITE_OTHER)
Admission: RE | Admit: 2020-05-05 | Discharge: 2020-05-05 | Disposition: A | Discharge status: Home / Self Care | Payer: BC Managed Care – PPO | Source: Ambulatory Visit | Attending: Physician Assistant | Admitting: Physician Assistant

## 2020-05-05 ENCOUNTER — Encounter: Payer: Self-pay | Admitting: Physician Assistant

## 2020-05-05 VITALS — BP 138/88 | HR 90 | Temp 98.3°F | Resp 16 | Ht 64.0 in | Wt 206.0 lb

## 2020-05-05 VITALS — BP 144/74 | HR 92 | Ht 64.0 in | Wt 206.4 lb

## 2020-05-05 DIAGNOSIS — M67911 Unspecified disorder of synovium and tendon, right shoulder: Secondary | ICD-10-CM

## 2020-05-05 DIAGNOSIS — Z1211 Encounter for screening for malignant neoplasm of colon: Secondary | ICD-10-CM

## 2020-05-05 DIAGNOSIS — Z23 Encounter for immunization: Secondary | ICD-10-CM

## 2020-05-05 DIAGNOSIS — G8929 Other chronic pain: Secondary | ICD-10-CM

## 2020-05-05 DIAGNOSIS — Z1231 Encounter for screening mammogram for malignant neoplasm of breast: Secondary | ICD-10-CM | POA: Diagnosis not present

## 2020-05-05 DIAGNOSIS — M545 Low back pain, unspecified: Secondary | ICD-10-CM

## 2020-05-05 DIAGNOSIS — M25511 Pain in right shoulder: Secondary | ICD-10-CM | POA: Diagnosis not present

## 2020-05-05 NOTE — Progress Notes (Signed)
Subjective:    I'm seeing this patient as a consultation for:  [name], PA-C. Note will be routed back to referring provider/PCP.  CC: R shoulder pain  I, Molly Weber, LAT, ATC, am serving as scribe for Dr. Lynne Leader.  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  She locates her pain to her R anterior shoulder.  Patient works at [employer].  Injury occurred at work although this has not been filed under [employer] yet.  Radiating pain: yes into the very proximal upper arm R shoulder mechanical symptoms: No Aggravating factors: R shoulder IR/ER; R sidelying Treatments tried: Meloxicam; Biofreeze   Past medical history, Surgical history, Family history, Social history, Allergies, and medications have been entered into the medical record, reviewed.   Review of Systems: No new headache, visual changes, nausea, vomiting, diarrhea, constipation, dizziness, abdominal pain, skin rash, fevers, chills, night sweats, weight loss, swollen lymph nodes, body aches, joint swelling, muscle aches, chest pain, shortness of breath, mood changes, visual or auditory hallucinations.   Objective:    Vitals:   05/05/20 1300  BP: (!) 144/74  Pulse: 92  SpO2: 97%   General: Well Developed, well nourished, and in no acute distress.  Neuro/Psych: Alert and oriented x3, extra-ocular muscles intact, able to move all 4 extremities, sensation grossly intact. Skin: Warm and dry, no rashes noted.  Respiratory: Not using accessory muscles, speaking in full sentences, trachea midline.  Cardiovascular: Pulses palpable, no extremity edema. Abdomen: Does not appear distended. MSK: Right shoulder normal-appearing Normal motion pain with abduction. Intact strength abduction external/internal rotation. Positive empty can test positive Hawkins and Neer's test. Negative Yergason's and speeds test. Pulses cap refill  and sensation are intact distally.  Lab and Radiology Results  Procedure: Real-time Ultrasound Guided Injection of right subacromial bursa Device: Philips Affiniti 50G Images permanently stored and available for review in PACS Ultrasound examination prior to injection reveals intact rotator cuff tendons.  Subacromial bursitis pattern present. Verbal informed consent obtained.  Discussed risks and benefits of procedure. Warned about infection bleeding damage to structures skin hypopigmentation and fat atrophy among others. Patient expresses understanding and agreement Time-out conducted.   Noted no overlying erythema, induration, or other signs of local infection.   Skin prepped in a sterile fashion.   Local anesthesia: Topical Ethyl chloride.   With sterile technique and under real time ultrasound guidance:  40 mg of Kenalog and 2 mL of Marcaine injected into bursa. Fluid seen entering the bursa.   Completed without difficulty   Pain immediately resolved suggesting accurate placement of the medication.   Advised to call if fevers/chills, erythema, induration, drainage, or persistent bleeding.   Images permanently stored and available for review in the ultrasound unit.  Impression: Technically successful ultrasound guided injection.    Impression and Recommendations:    Assessment and Plan: 49 y.o. female with right shoulder pain due to subacromial bursitis.  Patient may have originally suffered a rotator cuff tear although that is not seen on ultrasound examination today.  Plan for injection for subacromial bursitis and referral to physical therapy.  Recheck back if not improving.  Would consider x-ray and MRI as potential next steps.Marland Kitchen  PDMP not reviewed this encounter. Orders Placed This Encounter  Procedures  . [place] LIMITED JOINT SPACE STRUCTURES UP RIGHT(NO LINKED CHARGES)    Order Specific Question:   Reason for  Exam (SYMPTOM  OR DIAGNOSIS REQUIRED)    Answer:   R shoulder pain     Order Specific Question:   Preferred imaging location?    Answer:   [facility]  . Ambulatory referral to Physical Therapy    Referral Priority:   Routine    Referral Type:   Physical Medicine    Referral Reason:   Specialty Services Required    Requested Specialty:   Physical Therapy   No orders of the defined types were placed in this encounter.   Discussed warning signs or symptoms. Please see discharge instructions. Patient expresses understanding.   The above documentation has been reviewed and is accurate and complete Lynne Leader, M.D.

## 2020-05-05 NOTE — Patient Instructions (Signed)
Please go to our [facility] office for x-ray. They are open Monday - Friday 8-5, closing 12-1 for lunch The x-ray department is on the basement level.  Address is [place], [place]  I will call with results. For now, avoid heavy lifting and overexertion.  I am setting you up with Sports medicine for further evaluation and management of R rotator cuff.  Continue Meloxicam and Flexeril for now. I have sent in refills for you.   You will be contacted by [facility] to set up delivery of the kit to your home. Please complete and send back to them ASAP. We will call once we have results. This test is good for 3 years.   Please call Dr. Loanne Drilling to schedule a diabetes follow-up as this is a few months overdue.   Hang in there!

## 2020-05-05 NOTE — Patient Instructions (Signed)
Thank you for coming in today.  Plan for PT.   Call or go to the ER if you develop a large red swollen joint with extreme pain or oozing puss.   If not better let me know. We can do more including xray and MRI etc.   Recheck as needed.    Shoulder Impingement Syndrome Rehab Ask your health care provider which exercises are safe for you. Do exercises exactly as told by your health care provider and adjust them as directed. It is normal to feel mild stretching, pulling, tightness, or discomfort as you do these exercises. Stop right away if you feel sudden pain or your pain gets worse. Do not begin these exercises until told by your health care provider. Stretching and range-of-motion exercise This exercise warms up your muscles and joints and improves the movement and flexibility of your shoulder. This exercise also helps to relieve pain and stiffness. Passive horizontal adduction In passive adduction, you use your other hand to move the injured arm toward your body. The injured arm does not move on its own. In this movement, your arm is moved across your body in the horizontal plane (horizontal adduction). 1. Sit or stand and pull your left / right elbow across your chest, toward your other shoulder. Stop when you feel a gentle stretch in the back of your shoulder and upper arm. ? Keep your arm at shoulder height. ? Keep your arm as close to your body as you comfortably can. 2. Hold for __________ seconds. 3. Slowly return to the starting position. Repeat __________ times. Complete this exercise __________ times a day. Strengthening exercises These exercises build strength and endurance in your shoulder. Endurance is the ability to use your muscles for a long time, even after they get tired. External rotation, isometric This is an exercise in which you press the back of your wrist against a door frame without moving your shoulder joint (isometric). 1. Stand or sit in a doorway, facing the  door frame. 2. Bend your left / right elbow and place the back of your wrist against the door frame. Only the back of your wrist should be touching the frame. Keep your upper arm at your side. 3. Gently press your wrist against the door frame, as if you are trying to push your arm away from your abdomen (external rotation). Press as hard as you are able without pain. ? Avoid shrugging your shoulder while you press your wrist against the door frame. Keep your shoulder blade tucked down toward the middle of your back. 4. Hold for __________ seconds. 5. Slowly release the tension, and relax your muscles completely before you repeat the exercise. Repeat __________ times. Complete this exercise __________ times a day. Internal rotation, isometric This is an exercise in which you press your palm against a door frame without moving your shoulder joint (isometric). 1. Stand or sit in a doorway, facing the door frame. 2. Bend your left / right elbow and place the palm of your hand against the door frame. Only your palm should be touching the frame. Keep your upper arm at your side. 3. Gently press your hand against the door frame, as if you are trying to push your arm toward your abdomen (internal rotation). Press as hard as you are able without pain. ? Avoid shrugging your shoulder while you press your hand against the door frame. Keep your shoulder blade tucked down toward the middle of your back. 4. Hold for __________ seconds. 5.  Slowly release the tension, and relax your muscles completely before you repeat the exercise. Repeat __________ times. Complete this exercise __________ times a day. Scapular protraction, supine  1. Lie on your back on a firm surface (supine position). Hold a __________ weight in your left / right hand. 2. Raise your left / right arm straight into the air so your hand is directly above your shoulder joint. 3. Push the weight into the air so your shoulder (scapula) lifts off  the surface that you are lying on. The scapula will push up or forward (protraction). Do not move your head, neck, or back. 4. Hold for __________ seconds. 5. Slowly return to the starting position. Let your muscles relax completely before you repeat this exercise. Repeat __________ times. Complete this exercise __________ times a day. Scapular retraction  1. Sit in a stable chair without armrests, or stand up. 2. Secure an exercise band to a stable object in front of you so the band is at shoulder height. 3. Hold one end of the exercise band in each hand. Your palms should face down. 4. Squeeze your shoulder blades together (retraction) and move your elbows slightly behind you. Do not shrug your shoulders upward while you do this. 5. Hold for __________ seconds. 6. Slowly return to the starting position. Repeat __________ times. Complete this exercise __________ times a day. Shoulder extension  1. Sit in a stable chair without armrests, or stand up. 2. Secure an exercise band to a stable object in front of you so the band is above shoulder height. 3. Hold one end of the exercise band in each hand. 4. Straighten your elbows and lift your hands up to shoulder height. 5. Squeeze your shoulder blades together and pull your hands down to the sides of your thighs (extension). Stop when your hands are straight down by your sides. Do not let your hands go behind your body. 6. Hold for __________ seconds. 7. Slowly return to the starting position. Repeat __________ times. Complete this exercise __________ times a day. This information is not intended to replace advice given to you by your health care provider. Make sure you discuss any questions you have with your health care provider. Document Revised: 10/24/2018 Document Reviewed: 07/28/2018 Elsevier Patient Education  [place].
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
## 2020-05-06 ENCOUNTER — Encounter: Payer: Self-pay | Admitting: Physician Assistant

## 2020-05-06 ENCOUNTER — Other Ambulatory Visit: Payer: Self-pay | Admitting: Physician Assistant

## 2020-05-06 DIAGNOSIS — M25532 Pain in left wrist: Secondary | ICD-10-CM

## 2020-05-09 ENCOUNTER — Encounter: Payer: Self-pay | Admitting: Emergency Medicine

## 2020-05-10 ENCOUNTER — Ambulatory Visit: Payer: BC Managed Care – PPO

## 2020-05-10 ENCOUNTER — Ambulatory Visit: Payer: BC Managed Care – PPO | Admitting: Orthopaedic Surgery

## 2020-05-10 ENCOUNTER — Other Ambulatory Visit: Payer: Self-pay

## 2020-05-10 ENCOUNTER — Encounter: Payer: Self-pay | Admitting: Orthopaedic Surgery

## 2020-05-10 DIAGNOSIS — M654 Radial styloid tenosynovitis [de Quervain]: Secondary | ICD-10-CM

## 2020-05-10 NOTE — Progress Notes (Signed)
Office Visit Note   Patient: Teresa Parks           Date of Birth: [birth year]           MRN: [id number] Visit Date: 05/10/2020              Requested by: Brunetta Jeans, PA-C [address] [place],  [place] [address] PCP: Brunetta Jeans, PA-C   Assessment & Plan: Visit Diagnoses:  1. Tenosynovitis, de Quervain     Plan: Impression is left wrist de Quervain's tenosynovitis with ganglion cyst likely emanating from the extensor compartment.  We have discussed repeat cortisone injection versus surgical intervention.  The patient is a type II diabetic and her last A1c 7 months ago was 10.5.  We will repeat that today.  We will provide her with a removable thumb spica splint.  She will follow up with [place] as long as her A1c is under 8.0.  Call with concerns or questions in meantime.  Follow-Up Instructions: Return for once A1C under 8.0 to discuss surgery.   Orders:  Orders Placed This Encounter  Procedures   XR Wrist Complete Left   No orders of the defined types were placed in this encounter.     Procedures: No procedures performed   Clinical Data: No additional findings.   Subjective: Chief Complaint  Patient presents with   Left Wrist - Pain    HPI patient is a pleasant 49 year old female who comes in today with recurrent left wrist pain.  She was seen in our office in October of last year for de Quervain's tenosynovitis.  This was injected with cortisone and she was provided with a thumb spica splint.  She notes the injection did help for approximately 4 to 5 weeks but her pain has gradually started to return.  It is still not to the point where she was last year but it is becoming fairly bothersome.  All of her pain is to the distal radius primarily over the radial styloid.  She has increased pain specifically when she bumps it on a hard surface.  She denies any numbness, tingling or burning.  She was seen by Dr. Georgina Snell where ultrasound was performed for which  she states showed a large cyst.  Review of Systems as detailed in HPI.  All others reviewed and are negative.   Objective: Vital Signs: LMP 05/02/2020   Physical Exam well-developed well-nourished female no acute distress.  Alert and oriented x3.  Ortho Exam left wrist exam shows moderate tenderness to the radial styloid with a palpable nodule.  Minimally positive Finkelstein.  Full motion of the wrist.  She is neurovascular intact distally.  Specialty Comments:  No specialty comments available.  Imaging: XR Wrist Complete Left  Result Date: 05/10/2020 X-rays demonstrate soft tissue swelling to the left distal radius.  Otherwise, no acute findings    PMFS History: Patient Active Problem List   Diagnosis Date Noted   Right wrist pain 01/12/2019   Acute left ankle pain 01/12/2019   DJD (degenerative joint disease), lumbar 05/08/2018   Apneic episode 01/13/2017   Sacroiliac pain 08/19/2016   Hx of pancreatitis 07/05/2015   Hyperlipidemia associated with type 2 diabetes mellitus ([place]) 05/10/2014   Obesity 05/10/2014   Type II diabetes mellitus, uncontrolled ([place]) 03/15/2014   Hypertriglyceridemia 03/15/2014   Tobacco abuse 02/06/2014   OA (osteoarthritis) of knee 02/06/2014   Past Medical History:  Diagnosis Date   Chronic ear infection  Diabetes mellitus type II, controlled ([place])    HTN (hypertension)    Hyperlipidemia    Pancreatitis, acute     Family History  Problem Relation Age of Onset   Heart attack Father 64       Deceased   Diabetes Father    Diabetes Mother    Hypertension Mother        Living   Alzheimer's disease Maternal Grandfather    Healthy Brother    Healthy Daughter    Diabetes Paternal Uncle     Past Surgical History:  Procedure Laterality Date   KNEE ARTHROSCOPY Right    UMBILICAL HERNIA REPAIR     WISDOM TOOTH EXTRACTION     Social History   Occupational History   Occupation: Chief Financial Officer    Tobacco Use   Smoking status: Current Every Day Smoker    Types: Cigarettes   Smokeless tobacco: Never Used  Scientific laboratory technician Use: Never used  Substance and Sexual Activity   Alcohol use: No    Alcohol/week: 0.0 standard drinks   Drug use: No   Sexual activity: Not on file

## 2020-05-11 ENCOUNTER — Other Ambulatory Visit: Payer: Self-pay

## 2020-05-11 DIAGNOSIS — M545 Low back pain, unspecified: Secondary | ICD-10-CM

## 2020-05-11 LAB — HEMOGLOBIN A1C
Hgb A1c MFr Bld: 9.8 % of total Hgb — ABNORMAL HIGH (ref <5.7)
Mean Plasma Glucose: 235 (calc)
eAG (mmol/L): 13 (calc)

## 2020-05-11 LAB — EXTRA SPECIMEN

## 2020-05-16 ENCOUNTER — Other Ambulatory Visit: Payer: Self-pay

## 2020-05-16 ENCOUNTER — Encounter: Payer: Self-pay | Admitting: Physical Therapy

## 2020-05-16 ENCOUNTER — Ambulatory Visit: Payer: BC Managed Care – PPO | Attending: Family Medicine | Admitting: Physical Therapy

## 2020-05-16 DIAGNOSIS — M545 Low back pain, unspecified: Secondary | ICD-10-CM

## 2020-05-16 DIAGNOSIS — R29898 Other symptoms and signs involving the musculoskeletal system: Secondary | ICD-10-CM | POA: Insufficient documentation

## 2020-05-16 DIAGNOSIS — G8929 Other chronic pain: Secondary | ICD-10-CM | POA: Insufficient documentation

## 2020-05-16 NOTE — Therapy (Addendum)
[facility] [address]  [place], [place], [address] Phone: [phone]   Fax:  [phone]  Physical Therapy Evaluation  Patient Details  Name: Teresa Parks MRN: [id number] Date of Birth: May 25, 1971 Referring Provider (PT): Ferne Reus, [place]   Encounter Date: 05/16/2020   PT End of Session - 05/16/20 1613    Visit Number 1    Number of Visits 7    Date for PT Re-Evaluation 06/27/20    Authorization Type BCBS    PT Start Time 1528    PT Stop Time 1604    PT Time Calculation (min) 36 min    Activity Tolerance Patient tolerated treatment well    Behavior During Therapy [facility] for tasks assessed/performed           Past Medical History:  Diagnosis Date  . Chronic ear infection   . Diabetes mellitus type II, controlled ([place])   . HTN (hypertension)   . Hyperlipidemia   . Pancreatitis, acute     Past Surgical History:  Procedure Laterality Date  . KNEE ARTHROSCOPY Right   . UMBILICAL HERNIA REPAIR    . WISDOM TOOTH EXTRACTION      There were no vitals filed for this visit.    Subjective Assessment - 05/16/20 1531    Subjective Patient reports chronic LBP for 20+ years. Has been seeing a chiropractor and taking Flexeril as needed for pain which helps. Feels like she has knots in her back and is interested in DN for this. Pain occurs across the LB without radiation or N/T. Worse after "working hard at work" which requires lifting, and sometimes worse with prolonged walking. Better with heat. Notes a hx of R shoulder pain which has improved with injection, thus requesting to hold off on assessment of this.    Pertinent History HLT, HTN, DMII, R knee arthroscopy    Limitations Sitting;Lifting;Standing;Walking;House hold activities    How long can you sit comfortably? unlimited    How long can you stand comfortably? unlimited    How long can you walk comfortably? variable    Diagnostic tests xray of lumbar  spine showed multilevel degenerative changes without acute fracture from 05/06/20    Patient Stated Goals decrease frequency of LBP    Currently in Pain? Yes    Pain Score 1     Pain Location Back    Pain Orientation Right;Left;Lower    Pain Descriptors / Indicators Nagging    Pain Type Chronic pain              [facility] PT Assessment - 05/16/20 1536      Assessment   Medical Diagnosis Chronic B LBP without sciatica     Referring Provider (PT) Ferne Reus, PA-C    Onset Date/Surgical Date --   20+ years   Next MD Visit not scheduled    Prior Therapy yes- several years ago      Precautions   Precautions None      Balance Screen   Has the patient fallen in the past 6 months No    Has the patient had a decrease in activity level because of a fear of falling?  No    Is the patient reluctant to leave their home because of a fear of falling?  No      Home Environment   Living Environment Private residence    Living Arrangements Children    Available Help at Discharge Family  Type of [place] Access Level entry    [place] to live on main level with bedroom/bathroom      Prior Function   Level of Independence Independent    Vocation Full time employment    Vocation Requirements works for [employer]- lifting/standing    Leisure none      Cognition   Overall Cognitive Status Within Functional Limits for tasks assessed      Sensation   Light Touch Appears Intact      Coordination   Gross Motor Movements are Fluid and Coordinated Yes      Posture/Postural Control   Posture/Postural Control Postural limitations    Postural Limitations Rounded Shoulders   B elevated shoulders     ROM / Strength   AROM / PROM / Strength AROM;Strength      AROM   AROM Assessment Site Lumbar    Lumbar Flexion floor    Lumbar Extension moderately limited    Lumbar - Right Side Bend jt line    Lumbar - Left Side Bend jt line    Lumbar - Right Rotation WNL    Lumbar  - Left Rotation WNL      Strength   Strength Assessment Site Hip;Knee;Ankle    Right/Left Hip Right;Left    Right Hip Flexion 5/5    Right Hip ABduction 4+/5    Right Hip ADduction 4+/5    Left Hip Flexion 5/5    Left Hip ABduction 4+/5    Left Hip ADduction 4+/5    Right/Left Knee Right;Left    Right Knee Flexion 4+/5    Right Knee Extension 5/5    Left Knee Flexion 4+/5    Left Knee Extension 5/5    Right/Left Ankle Right;Left    Right Ankle Dorsiflexion 5/5    Right Ankle Plantar Flexion 5/5   20 reps    Left Ankle Dorsiflexion 5/5    Left Ankle Plantar Flexion 5/5   20 reps     Flexibility   Soft Tissue Assessment /Muscle Length yes    Hamstrings mildly tight R, L WNL    Quadriceps mod thomas mildly tight R, L WNL    Piriformis moderately tight R, L mildly tight in KTOS and fig 4      Palpation   SI assessment  increased tenderness and hypomobility with central PAs along T11-L5    Palpation comment "boxy" appearance of buttocks suggesting tightness in hip rotators- patient with tightness with palpation of B piriformis, and L glute med but no tenderness reported      Ambulation/Gait   Assistive device None    Gait Pattern Step-through pattern   B increased toe out   Ambulation Surface Level;Indoor    Gait velocity WNL                      Objective measurements completed on examination: See above findings.               PT Education - 05/16/20 1612    Education Details prognosis, POC, HEP    Person(s) Educated Patient    Methods Explanation;Demonstration;Tactile cues;Verbal cues;Handout    Comprehension Verbalized understanding;Returned demonstration            PT Short Term Goals - 05/16/20 1618      PT SHORT TERM GOAL #1   Title Patient to be independent with initial HEP.    Time 2    Period Weeks  Status New    Target Date 05/30/20             PT Long Term Goals - 05/16/20 1619      PT LONG TERM GOAL #1   Title  Patient to be independent with advanced HEP.    Time 6    Period Weeks    Status New    Target Date 06/27/20      PT LONG TERM GOAL #2   Title Patient to demonstrate lumbar extension AROM WFL and without pain limiting.    Time 6    Period Weeks    Status New    Target Date 06/27/20      PT LONG TERM GOAL #3   Title Patient to report no tenderness and demonstrate only mild hypomobility with central PAs along lumbar spine.    Time 6    Period Weeks    Status New    Target Date 06/27/20      PT LONG TERM GOAL #4   Title Patient to report 80% improvement in daily pain levels.    Time 6    Period Weeks    Status New    Target Date 06/27/20                  Plan - 05/16/20 1613    Clinical Impression Statement Patient is a 49 y/o F presenting to OPPT with c/o chronic insidious LBP for the past 20+ years. Pain occurs across the LB without radiation or N/T. Worse after "working hard at work" which requires lifting, and sometimes worse with prolonged walking. Patient is interested in DN for her LBP. Notes hx of a recent R shoulder injury which improved with injection, thus requesting to hold off assessment of this today. Patient today presenting with rounded and elevated shoulders, limited lumbar extension AROM, good LE strength, LE tightness R>L, and tenderness and hypomobility with central PAs at T11-L5. Patient was educated on gentle stretching and mobility HEP- patient reported understanding. Would benefit from skilled PT services 1x/week for 6 weeks to address aforementioned impairments.    Personal Factors and Comorbidities Age;Comorbidity 3+;Fitness;Past/Current Experience;Profession;Time since onset of injury/illness/exacerbation    Comorbidities HLT, HTN, DMII, R knee arthroscopy    Examination-Activity Limitations Bend;Squat;Carry;Lift    Examination-Participation Restrictions Yard Work;Laundry;Occupation    Stability/Clinical Decision Making Stable/Uncomplicated     Clinical Decision Making Low    Rehab Potential Good    PT Frequency 1x / week    PT Duration 6 weeks    PT Treatment/Interventions ADLs/Self Care Home Management;Cryotherapy;Electrical Stimulation;Moist Heat;Traction;Therapeutic exercise;Therapeutic activities;Functional mobility training;Stair training;Gait training;Ultrasound;Neuromuscular re-education;Patient/family education;Manual techniques;Taping;Energy conservation;Dry needling;Passive range of motion    PT Next Visit Plan reassess HEP, progress hip stretching, lumbar extension stretching, spinal jt mobs    Consulted and Agree with Plan of Care Patient           Patient will benefit from skilled therapeutic intervention in order to improve the following deficits and impairments:  Hypomobility, Pain, Increased fascial restricitons, Decreased strength, Decreased activity tolerance, Difficulty walking, Increased muscle spasms, Improper body mechanics, Decreased range of motion, Postural dysfunction, Impaired flexibility  Visit Diagnosis: Chronic bilateral low back pain without sciatica  Other symptoms and signs involving the musculoskeletal system     Problem List Patient Active Problem List   Diagnosis Date Noted  . Right wrist pain 01/12/2019  . Acute left ankle pain 01/12/2019  . DJD (degenerative joint disease), lumbar 05/08/2018  . Apneic episode 01/13/2017  .  Sacroiliac pain 08/19/2016  . Hx of pancreatitis 07/05/2015  . Hyperlipidemia associated with type 2 diabetes mellitus ([place]) 05/10/2014  . Obesity 05/10/2014  . Type II diabetes mellitus, uncontrolled ([place]) 03/15/2014  . Hypertriglyceridemia 03/15/2014  . Tobacco abuse 02/06/2014  . OA (osteoarthritis) of knee 02/06/2014     Janene Harvey, PT, DPT 05/16/20 4:22 PM   [facility] [address]  [place], [place], [address] Phone: [phone]   Fax:  [phone]  Name: Teresa Parks MRN: [id number] Date of Birth: [birth year]   PHYSICAL THERAPY DISCHARGE SUMMARY  Visits from Start of Care: 1  Current functional level related to goals / functional outcomes: See above clinical impression; patient did not return   Remaining deficits: See above   Education / Equipment: HEP  Plan: Patient agrees to discharge.  Patient goals were not met. Patient is being discharged due to not returning since the last visit.  ?????     Janene Harvey, PT, DPT 06/15/20 4:38 PM

## 2020-05-27 ENCOUNTER — Other Ambulatory Visit: Payer: Self-pay

## 2020-05-27 ENCOUNTER — Encounter: Payer: Self-pay | Admitting: Endocrinology

## 2020-05-27 ENCOUNTER — Ambulatory Visit: Payer: BC Managed Care – PPO | Admitting: Endocrinology

## 2020-05-27 VITALS — BP 160/90 | HR 92 | Ht 64.0 in | Wt 209.0 lb

## 2020-05-27 DIAGNOSIS — E1165 Type 2 diabetes mellitus with hyperglycemia: Secondary | ICD-10-CM

## 2020-05-27 LAB — BASIC METABOLIC PANEL
BUN: 13 mg/dL (ref 6–23)
CO2: 29 mEq/L (ref 19–32)
Calcium: 8.4 mg/dL (ref 8.4–10.5)
Chloride: 100 mEq/L (ref 96–112)
Creatinine, Ser: 0.87 mg/dL (ref 0.40–1.20)
GFR: 77.54 mL/min (ref >60.00)
Glucose, Bld: 239 mg/dL — ABNORMAL HIGH (ref 70–99)
Potassium: 3.5 mEq/L (ref 3.5–5.1)
Sodium: 136 mEq/L (ref 135–145)

## 2020-05-27 LAB — TSH: TSH: 1.85 u[IU]/mL (ref 0.35–4.50)

## 2020-05-27 LAB — LIPID PANEL
Cholesterol: 123 mg/dL (ref 0–200)
HDL: 28.3 mg/dL — ABNORMAL LOW (ref >39.00)
Total CHOL/HDL Ratio: 4
Triglycerides: 564 mg/dL — ABNORMAL HIGH (ref 0.0–149.0)

## 2020-05-27 LAB — LDL CHOLESTEROL, DIRECT: Direct LDL: 38 mg/dL

## 2020-05-27 MED ORDER — OZEMPIC (1 MG/DOSE) 2 MG/1.5ML ~~LOC~~ SOPN: 1.0000 mg | PEN_INJECTOR | SUBCUTANEOUS | 3 refills | Status: DC

## 2020-05-27 MED ORDER — VASCEPA 0.5 G PO CAPS: 4.0000 | ORAL_CAPSULE | Freq: Every day | ORAL | 3 refills | Status: DC

## 2020-05-27 MED ORDER — TRESIBA FLEXTOUCH 200 UNIT/ML ~~LOC~~ SOPN: 90.0000 [IU] | PEN_INJECTOR | Freq: Every day | SUBCUTANEOUS | 3 refills | Status: DC

## 2020-05-27 NOTE — Progress Notes (Signed)
Subjective:    Patient ID: Teresa Parks, female    DOB: [birth year], 50 y.o.   MRN: [id number]  HPI Pt returns for f/u of diabetes mellitus: DM type: Insulin-requiring type 2 Dx'ed: 3790 Complications: none Therapy: insulin since dx, Ozempic, and metformin GDM: never DKA: never Severe hypoglycemia: never Pancreatitis: once, in 2015 Pancreatic imaging: normal on 2016 [place] SDOH: she works 1st shift now; she was changed to [place], due to missing insulin doses.  Other: she also has severe hypertriglyceridemia; she declines multiple daily injections.  Interval history: no cbg record, but states cbg's vary from 97-130.  Over the past month, she seldom misses the insulin  No recent steroids.  Past Medical History:  Diagnosis Date  . Chronic ear infection   . Diabetes mellitus type II, controlled ([place])   . HTN (hypertension)   . Hyperlipidemia   . Pancreatitis, acute     Past Surgical History:  Procedure Laterality Date  . KNEE ARTHROSCOPY Right   . UMBILICAL HERNIA REPAIR    . WISDOM TOOTH EXTRACTION      Social History   Socioeconomic History  . Marital status: Married    Spouse name: Not on file  . Number of children: 1  . Years of education: Not on file  . Highest education level: Not on file  Occupational History  . Occupation: Chief Financial Officer  Tobacco Use  . Smoking status: Current Every Day Smoker    Types: Cigarettes  . Smokeless tobacco: Never Used  Vaping Use  . Vaping Use: Never used  Substance and Sexual Activity  . Alcohol use: No    Alcohol/week: 0.0 standard drinks  . Drug use: No  . Sexual activity: Not on file  Other Topics Concern  . Not on file  Social History Narrative  . Not on file   Social Determinants of Health   Financial Resource Strain:   . Difficulty of Paying Living Expenses: Not on file  Food Insecurity:   . Worried About Charity fundraiser in the Last Year: Not on file  . Ran Out of Food in the Last Year: Not on file   Transportation Needs:   . Lack of Transportation (Medical): Not on file  . Lack of Transportation (Non-Medical): Not on file  Physical Activity:   . Days of Exercise per Week: Not on file  . Minutes of Exercise per Session: Not on file  Stress:   . Feeling of Stress : Not on file  Social Connections:   . Frequency of Communication with Friends and Family: Not on file  . Frequency of Social Gatherings with Friends and Family: Not on file  . Attends Religious Services: Not on file  . Active Member of Clubs or Organizations: Not on file  . Attends Archivist Meetings: Not on file  . Marital Status: Not on file  Intimate Partner Violence:   . Fear of Current or Ex-Partner: Not on file  . Emotionally Abused: Not on file  . Physically Abused: Not on file  . Sexually Abused: Not on file    Current Outpatient Medications on File Prior to Visit  Medication Sig Dispense Refill  . atorvastatin (LIPITOR) 40 MG tablet Take 1 tablet by mouth once daily 30 tablet 3  . cyclobenzaprine (FLEXERIL) 10 MG tablet TAKE 1 TABLET BY MOUTH AT BEDTIME 30 tablet 0  . fenofibrate 160 MG tablet Take 1 tablet by mouth once daily 30 tablet 3  . glucose blood test strip  Use as instructed 100 each 12  . Insulin Pen Needle 31G X 8 MM MISC 1 each by Does not apply route daily. Use to inject insulin daily; E11.65 90 each 0  . Lancets (FREESTYLE) lancets Use as instructed 100 each 12  . meloxicam (MOBIC) 15 MG tablet Take 1 tablet by mouth once daily 15 tablet 0  . metFORMIN (GLUCOPHAGE-XR) 500 MG 24 hr tablet Take 4 tablets (2,000 mg total) by mouth daily. Further refills to come from Endo (Patient taking differently: Take 1,500 mg by mouth daily. ) 360 tablet 0  . olmesartan (BENICAR) 5 MG tablet Take 1 tablet by mouth once daily 30 tablet 3  . [DISCONTINUED] cyclobenzaprine (FLEXERIL) 10 MG tablet TAKE 1 TABLET BY MOUTH ONCE DAILY AT BEDTIME 30 tablet 0   No current facility-administered medications on  file prior to visit.    Allergies  Allergen Reactions  . Novocain [Procaine] Nausea And Vomiting    Family History  Problem Relation Age of Onset  . Heart attack Father 52       Deceased  . Diabetes Father   . Diabetes Mother   . Hypertension Mother        Living  . Alzheimer's disease Maternal Grandfather   . Healthy Brother   . Healthy Daughter   . Diabetes Paternal Uncle     BP (!) 160/90   Pulse 92   Ht 5\' 4"  (1.626 m)   Wt 209 lb (94.8 kg)   LMP 05/02/2020   SpO2 90%   BMI 35.87 kg/m    Review of Systems She denies hypoglycemia and nausea.      Objective:   Physical Exam VITAL SIGNS:  See vs page GENERAL: no distress Pulses: dorsalis pedis intact bilat.   MSK: no deformity of the feet CV: no leg edema Skin:  no ulcer on the feet.  normal color and temp on the feet.   Neuro: sensation is intact to touch on the feet.  Ext: there is bilateral onychomycosis of the toenails.    Lab Results  Component Value Date   HGBA1C 9.8 (H) 05/10/2020   Lab Results  Component Value Date   CHOL 123 05/27/2020   HDL 28.30 (L) 05/27/2020   LDLCALC NOT CALC 09/19/2015   LDLDIRECT 38.0 05/27/2020   TRIG (H) 05/27/2020    564.0 Triglyceride is over 400; calculations on Lipids are invalid.   CHOLHDL 4 05/27/2020       Assessment & Plan:  HTN: is noted today Insulin-requiring type 2 DM: uncontrolled Dyslipidemia: I have sent a prescription to your pharmacy, to add Vascepa.   Patient Instructions  Your blood pressure is high today.  Please see your primary care provider soon, to have it rechecked I have sent a prescription to your pharmacy, to reduce the Tresiba to 90 units per day.  This is a slow time-release insulin.  This way, if you miss it, you can make it up later. Also, I have sent a prescription to your pharmacy, to increase the Ozempic.   check your blood sugar twice a day.  vary the time of day when you check, between before the 3 meals, and at bedtime.   also check if you have symptoms of your blood sugar being too high or too low.  please keep a record of the readings and bring it to your next appointment here (or you can bring the meter itself).  You can write it on any piece of paper.  please  call us sooner if your blood sugar goes below 70, or if you have a lot of readings over 200. Blood tests are requested for you today.  We'll let you know about the results.   If the triglycerides are high, we can add prescription fish oil.   Please come back for a follow-up appointment in 2 months.

## 2020-05-27 NOTE — Patient Instructions (Addendum)
Your blood pressure is high today.  Please see your primary care provider soon, to have it rechecked I have sent a prescription to your pharmacy, to reduce the Tresiba to 90 units per day.  This is a slow time-release insulin.  This way, if you miss it, you can make it up later. Also, I have sent a prescription to your pharmacy, to increase the Ozempic.   check your blood sugar twice a day.  vary the time of day when you check, between before the 3 meals, and at bedtime.  also check if you have symptoms of your blood sugar being too high or too low.  please keep a record of the readings and bring it to your next appointment here (or you can bring the meter itself).  You can write it on any piece of paper.  please call us sooner if your blood sugar goes below 70, or if you have a lot of readings over 200. Blood tests are requested for you today.  We'll let you know about the results.   If the triglycerides are high, we can add prescription fish oil.   Please come back for a follow-up appointment in 2 months.

## 2020-07-01 ENCOUNTER — Other Ambulatory Visit: Payer: Self-pay | Admitting: Physician Assistant

## 2020-07-01 DIAGNOSIS — E1165 Type 2 diabetes mellitus with hyperglycemia: Secondary | ICD-10-CM

## 2020-07-06 ENCOUNTER — Telehealth: Payer: Self-pay | Admitting: Physician Assistant

## 2020-07-06 NOTE — Telephone Encounter (Signed)
Received letter from [facility] sciences regarding patient's Cologuard. Since they are still waiting on her to return it. Please reach out to patient and encouraged her to complete the test and send back as soon as possible.

## 2020-07-06 NOTE — Telephone Encounter (Signed)
Spoke with patient and she is having menstrual cycle for 5 weeks. She didn't want to have any abnormal testing. Once she gets the menstrual cycle fixed she will complete the Cologuard.

## 2020-07-07 NOTE — Telephone Encounter (Signed)
Yes patient is being followed by her GYN

## 2020-07-07 NOTE — Telephone Encounter (Signed)
Make sure she has had evaluation with GYN for this. If so, nothing further needed at present. If not she needs to be seen.

## 2020-08-13 ENCOUNTER — Other Ambulatory Visit: Payer: Self-pay | Admitting: Physician Assistant

## 2020-08-16 NOTE — Telephone Encounter (Signed)
Patient requesting a refill of the Flexeril for low back pain. Patient is currently following with Rehab. Flexeril rx was given in 05/06/20 low back pain Please advise

## 2020-08-18 ENCOUNTER — Telehealth (INDEPENDENT_AMBULATORY_CARE_PROVIDER_SITE_OTHER): Payer: BC Managed Care – PPO | Admitting: Family Medicine

## 2020-08-18 DIAGNOSIS — H9202 Otalgia, left ear: Secondary | ICD-10-CM | POA: Diagnosis not present

## 2020-08-18 DIAGNOSIS — R059 Cough, unspecified: Secondary | ICD-10-CM

## 2020-08-18 DIAGNOSIS — R0981 Nasal congestion: Secondary | ICD-10-CM | POA: Diagnosis not present

## 2020-08-18 MED ORDER — AMOXICILLIN-POT CLAVULANATE 875-125 MG PO TABS: 1.0000 | ORAL_TABLET | Freq: Two times a day (BID) | ORAL | 0 refills | Status: DC

## 2020-08-18 NOTE — Patient Instructions (Signed)
   ---------------------------------------------------------------------------------------------------------------------------      WORK SLIP:  Patient Teresa Parks,  08/16/1970, was seen for a medical visit today, 08/18/20 . Please excuse from work for a minimum of 48 hours and until symptoms have improved/resolved for 24 hours.   Sincerely: E-signature: Dr. Colin Benton, DO [place] Ph: [phone]   ------------------------------------------------------------------------------------------------------------------------------   -I sent the medication(s) we discussed to your pharmacy: Meds ordered this encounter  Medications  . amoxicillin-clavulanate (AUGMENTIN) 875-125 MG tablet    Sig: Take 1 tablet by mouth 2 (two) times daily.    Dispense:  20 tablet    Refill:  0     I hope you are feeling better soon!  Seek in person care promptly if your symptoms worsen, new concerns arise or you are not improving with treatment.  It was nice to meet you today. I help [facility] out with telemedicine visits on Tuesdays and Thursdays and am available for visits on those days. If you have any concerns or questions following this visit please schedule a follow up visit with your Primary Care doctor or seek care at a local urgent care clinic to avoid delays in care.

## 2020-08-18 NOTE — Progress Notes (Signed)
Virtual Visit via Video Note  I connected with@  on 08/18/20 at 10:00 AM EST by a video enabled telemedicine application and verified that I am speaking with the correct person using two identifiers.  Location patient: home, [place] Location provider:work or home office Persons participating in the virtual visit: patient, provider  I discussed the limitations of evaluation and management by telemedicine and the availability of in person appointments. The patient expressed understanding and agreed to proceed.   HPI:  Acute telemedicine visit for L ear pain: -Onset:yesterday -grandson diagnosed with strep this week -Symptoms include: bad pain in the L ear, sinus congestion, cough (has had persistent sinus congestion and cough since she was sick with covid a few weeks ago - thick and discolored, green) -Denies: fever, CP, SOB, NVD, inability to get out of bed/eat and drink -Has tried: lozenges -Pertinent past medical history: had covid about 3 weeks ago (everyone in her family got it); also has history of OM per her report and feels this is what is going on today -Pertinent medication allergies: novocain -COVID-19 vaccine status: not vaccinated, had flu vaccinated  ROS: See pertinent positives and negatives per HPI.  Past Medical History:  Diagnosis Date  . Chronic ear infection   . Diabetes mellitus type II, controlled ([place])   . HTN (hypertension)   . Hyperlipidemia   . Pancreatitis, acute     Past Surgical History:  Procedure Laterality Date  . KNEE ARTHROSCOPY Right   . UMBILICAL HERNIA REPAIR    . WISDOM TOOTH EXTRACTION       Current Outpatient Medications:  .  amoxicillin-clavulanate (AUGMENTIN) 875-125 MG tablet, Take 1 tablet by mouth 2 (two) times daily., Disp: 20 tablet, Rfl: 0 .  atorvastatin (LIPITOR) 40 MG tablet, Take 1 tablet by mouth once daily, Disp: 30 tablet, Rfl: 3 .  cyclobenzaprine (FLEXERIL) 10 MG tablet, TAKE 1 TABLET BY MOUTH AT BEDTIME, Disp: 30 tablet,  Rfl: 0 .  fenofibrate 160 MG tablet, Take 1 tablet by mouth once daily, Disp: 30 tablet, Rfl: 3 .  glucose blood test strip, Use as instructed, Disp: 100 each, Rfl: 12 .  Icosapent Ethyl (VASCEPA) 0.5 g CAPS, Take 4 capsules by mouth daily., Disp: 360 capsule, Rfl: 3 .  insulin degludec (TRESIBA FLEXTOUCH) 200 UNIT/ML FlexTouch Pen, Inject 90 Units into the skin daily. And pen needles 1/day, Disp: 90 mL, Rfl: 3 .  Insulin Pen Needle 31G X 8 MM MISC, 1 each by Does not apply route daily. Use to inject insulin daily; E11.65, Disp: 90 each, Rfl: 0 .  Lancets (FREESTYLE) lancets, Use as instructed, Disp: 100 each, Rfl: 12 .  meloxicam (MOBIC) 15 MG tablet, Take 1 tablet by mouth once daily, Disp: 15 tablet, Rfl: 0 .  metFORMIN (GLUCOPHAGE-XR) 500 MG 24 hr tablet, Take 4 tablets (2,000 mg total) by mouth daily. Further refills to come from End[facility] (Patient taking differently: Take 1,500 mg by mouth daily. ), Disp: 360 tablet, Rfl: 0 .  olmesartan (BENICAR) 5 MG tablet, Take 1 tablet by mouth once daily, Disp: 30 tablet, Rfl: 3 .  Semaglutide, 1 MG/DOSE, (OZEMPIC, 1 MG/DOSE,) 2 MG/1.5ML SOPN, Inject 1 mg into the skin once a week., Disp: 9 mL, Rfl: 3  EXAM:  VITALS per patient if applicable:  GENERAL: alert, oriented, appears well and in no acute distress  HEENT: atraumatic, conjunttiva clear, no obvious abnormalities on inspection of external nose and ears  NECK: normal movements of the head and neck  LUNGS:  on inspection no signs of respiratory distress, breathing rate appears normal, no obvious gross SOB, gasping or wheezing  CV: no obvious cyanosis  MS: moves all visible extremities without noticeable abnormality  PSYCH/NEURO: pleasant and cooperative, no obvious depression or anxiety, speech and thought processing grossly intact  ASSESSMENT AND PLAN:  Discussed the following assessment and plan:  Left ear pain  Nasal sinus congestion  Cough  -we discussed possible serious and  likely etiologies, options for evaluation and workup, limitations of telemedicine visit vs in person visit, treatment, treatment risks and precautions. Pt prefers to treat via telemedicine empirically rather than in person at this moment.  Given duration of sinus congestion now with left ear pain and thick discolored sinus mucus, query sinusitis and possible otitis media, versus other.  She opted for empiric treatment with Augmentin 875 twice daily for 10 days.  We also discussed the possibility of Covid reinfection, feel that is less likely given she had an infection with Covid 3 weeks ago.  We did discuss options for testing and advised antigen testing if she does test.  Advised staying home while sick and for full 24 hours after symptoms have reviewed resolved or improved significantly.  She agrees to test if not feeling significantly better on the antibiotic in 24 -48 hours.  Discussed potential complications and precautions. Work/School slipped offered: provided in patient instructions  Scheduled follow up with PCP offered: Agrees to schedule follow-up if needed Advised to seek prompt in person care if worsening, new symptoms arise, or if is not improving with treatment. Discussed options for inperson care if PCP office not available. Did let this patient know that I only do telemedicine on Tuesdays and Thursdays for [place]. Advised to schedule follow up visit with PCP or [facility] if any further questions or concerns to avoid delays in care.   I discussed the assessment and treatment plan with the patient. The patient was provided an opportunity to ask questions and all were answered. The patient agreed with the plan and demonstrated an understanding of the instructions.     Lucretia Kern, DO

## 2020-08-22 ENCOUNTER — Ambulatory Visit: Payer: BC Managed Care – PPO | Admitting: Family Medicine

## 2020-10-20 ENCOUNTER — Other Ambulatory Visit: Payer: Self-pay | Admitting: Endocrinology

## 2020-10-24 ENCOUNTER — Other Ambulatory Visit: Payer: Self-pay

## 2020-10-24 ENCOUNTER — Ambulatory Visit: Payer: BC Managed Care – PPO | Admitting: Endocrinology

## 2020-10-24 VITALS — BP 166/86 | HR 101 | Ht 64.0 in | Wt 205.4 lb

## 2020-10-24 DIAGNOSIS — E1165 Type 2 diabetes mellitus with hyperglycemia: Secondary | ICD-10-CM

## 2020-10-24 LAB — POCT GLYCOSYLATED HEMOGLOBIN (HGB A1C): Hemoglobin A1C: 8.4 % — AB (ref 4.0–5.6)

## 2020-10-24 MED ORDER — TRESIBA FLEXTOUCH 200 UNIT/ML ~~LOC~~ SOPN: 80.0000 [IU] | PEN_INJECTOR | Freq: Every day | SUBCUTANEOUS | 3 refills | Status: DC

## 2020-10-24 MED ORDER — OZEMPIC (1 MG/DOSE) 2 MG/1.5ML ~~LOC~~ SOPN: 2.0000 mg | PEN_INJECTOR | SUBCUTANEOUS | 11 refills | Status: DC

## 2020-10-24 NOTE — Patient Instructions (Addendum)
Your blood pressure is high today.  Please see your primary care provider soon, to have it rechecked I have sent a prescription to your pharmacy, to reduce the Tresiba to 80 units per day.  This is a slow time-release insulin.  This way, if you miss it, you can make it up later. Also, I have sent a prescription to your pharmacy, to increase the Ozempic again.   check your blood sugar twice a day.  vary the time of day when you check, between before the 3 meals, and at bedtime.  also check if you have symptoms of your blood sugar being too high or too low.  please keep a record of the readings and bring it to your next appointment here (or you can bring the meter itself).  You can write it on any piece of paper.  please call us sooner if your blood sugar goes below 70, or if you have a lot of readings over 200.   Please come back for a follow-up appointment in 3 months.

## 2020-10-24 NOTE — Progress Notes (Signed)
Subjective:    Patient ID: Teresa Parks, female    DOB: November 04, 1970, 50 y.o.   MRN: [id number]  HPI Pt returns for f/u of diabetes mellitus: DM type: Insulin-requiring type 2 Dx'ed: 4580 Complications: none Therapy: insulin since dx, and Ozempic. GDM: never DKA: never Severe hypoglycemia: never.  Pancreatitis: once, in 2015 Pancreatic imaging: normal on 2016 [place] SDOH: she works 1st shift now; she was changed to [place], due to missing insulin doses.  Other: she also has severe hypertriglyceridemia; she declines multiple daily injections.   Interval history: no cbg record, but states cbg's vary from 95-140.  No recent steroids.  She stopped metformin, because she says it was increasing glucose.  She takes other meds as rx'ed.   Past Medical History:  Diagnosis Date  . Chronic ear infection   . Diabetes mellitus type II, controlled ([place])   . HTN (hypertension)   . Hyperlipidemia   . Pancreatitis, acute     Past Surgical History:  Procedure Laterality Date  . KNEE ARTHROSCOPY Right   . UMBILICAL HERNIA REPAIR    . WISDOM TOOTH EXTRACTION      Social History   Socioeconomic History  . Marital status: Married    Spouse name: Not on file  . Number of children: 1  . Years of education: Not on file  . Highest education level: Not on file  Occupational History  . Occupation: Chief Financial Officer  Tobacco Use  . Smoking status: Current Every Day Smoker    Types: Cigarettes  . Smokeless tobacco: Never Used  Vaping Use  . Vaping Use: Never used  Substance and Sexual Activity  . Alcohol use: No    Alcohol/week: 0.0 standard drinks  . Drug use: No  . Sexual activity: Not on file  Other Topics Concern  . Not on file  Social History Narrative  . Not on file   Social Determinants of Health   Financial Resource Strain: Not on file  Food Insecurity: Not on file  Transportation Needs: Not on file  Physical Activity: Not on file  Stress: Not on file  Social  Connections: Not on file  Intimate Partner Violence: Not on file    Current Outpatient Medications on File Prior to Visit  Medication Sig Dispense Refill  . atorvastatin (LIPITOR) 40 MG tablet Take 1 tablet by mouth once daily 30 tablet 3  . cyclobenzaprine (FLEXERIL) 10 MG tablet TAKE 1 TABLET BY MOUTH AT BEDTIME 30 tablet 0  . fenofibrate 160 MG tablet Take 1 tablet by mouth once daily 30 tablet 3  . glucose blood test strip Use as instructed 100 each 12  . Icosapent Ethyl (VASCEPA) 0.5 g CAPS Take 4 capsules by mouth daily. 360 capsule 3  . Insulin Pen Needle 31G X 8 MM MISC 1 each by Does not apply route daily. Use to inject insulin daily; E11.65 90 each 0  . Lancets (FREESTYLE) lancets Use as instructed 100 each 12  . meloxicam (MOBIC) 15 MG tablet Take 1 tablet by mouth once daily 15 tablet 0  . olmesartan (BENICAR) 5 MG tablet Take 1 tablet by mouth once daily 30 tablet 3   No current facility-administered medications on file prior to visit.    Allergies  Allergen Reactions  . Novocain [Procaine] Nausea And Vomiting    Family History  Problem Relation Age of Onset  . Heart attack Father 89       Deceased  . Diabetes Father   . Diabetes Mother   .  Hypertension Mother        Living  . Alzheimer's disease Maternal Grandfather   . Healthy Brother   . Healthy Daughter   . Diabetes Paternal Uncle     BP (!) 166/86 (BP Location: Right Arm, Patient Position: Sitting, Cuff Size: Normal)   Pulse (!) 101   Ht 5\' 4"  (1.626 m)   Wt 205 lb 6.4 oz (93.2 kg)   SpO2 96%   BMI 35.26 kg/m    Review of Systems Denies n/v    Objective:   Physical Exam VITAL SIGNS:  See vs page GENERAL: no distress Pulses: dorsalis pedis intact bilat.   MSK: no deformity of the feet CV: no leg edema Skin:  no ulcer on the feet.  normal color and temp on the feet.   Neuro: sensation is intact to touch on the feet.  Ext: there is bilateral onychomycosis of the toenails.    Lab Results   Component Value Date   HGBA1C 8.4 (A) 10/24/2020       Assessment & Plan:  HTN: is noted today Insulin-requiring type 2 DM: uncontrolled   Patient Instructions  Your blood pressure is high today.  Please see your primary care provider soon, to have it rechecked I have sent a prescription to your pharmacy, to reduce the Tresiba to 80 units per day.  This is a slow time-release insulin.  This way, if you miss it, you can make it up later. Also, I have sent a prescription to your pharmacy, to increase the Ozempic again.   check your blood sugar twice a day.  vary the time of day when you check, between before the 3 meals, and at bedtime.  also check if you have symptoms of your blood sugar being too high or too low.  please keep a record of the readings and bring it to your next appointment here (or you can bring the meter itself).  You can write it on any piece of paper.  please call us sooner if your blood sugar goes below 70, or if you have a lot of readings over 200.   Please come back for a follow-up appointment in 3 months.

## 2020-10-26 ENCOUNTER — Telehealth: Payer: Self-pay | Admitting: Endocrinology

## 2020-10-26 NOTE — Telephone Encounter (Signed)
Patient Has About Blood Sugar levels during pregnancy - call back # [phone]

## 2020-10-27 ENCOUNTER — Telehealth: Payer: Self-pay

## 2020-10-27 NOTE — Telephone Encounter (Signed)
Spoke with pt

## 2020-10-27 NOTE — Telephone Encounter (Signed)
Spoke with pt with some concerns and questions regarding gestational diabetes that her daughter is experiencing and just wanted to run t by [employer].

## 2020-10-27 NOTE — Telephone Encounter (Signed)
LVM for pt to let her know that I did A PA on her Teresa Parks and it came back saying PA has been resolved and no further PA is required so I asked her to contact her pharmacy

## 2021-01-16 ENCOUNTER — Encounter: Payer: Self-pay | Admitting: Endocrinology

## 2021-02-22 DIAGNOSIS — Z1231 Encounter for screening mammogram for malignant neoplasm of breast: Secondary | ICD-10-CM | POA: Diagnosis not present

## 2021-03-15 ENCOUNTER — Other Ambulatory Visit: Payer: Self-pay

## 2021-03-15 ENCOUNTER — Ambulatory Visit (INDEPENDENT_AMBULATORY_CARE_PROVIDER_SITE_OTHER): Payer: BC Managed Care – PPO | Admitting: Endocrinology

## 2021-03-15 VITALS — BP 180/100 | HR 86 | Ht 64.0 in | Wt 199.6 lb

## 2021-03-15 DIAGNOSIS — E1165 Type 2 diabetes mellitus with hyperglycemia: Secondary | ICD-10-CM | POA: Diagnosis not present

## 2021-03-15 LAB — POCT GLYCOSYLATED HEMOGLOBIN (HGB A1C): Hemoglobin A1C: 6.3 % — AB (ref 4.0–5.6)

## 2021-03-15 LAB — LIPID PANEL
Cholesterol: 124 mg/dL (ref 0–200)
HDL: 28.9 mg/dL — ABNORMAL LOW (ref >39.00)
NonHDL: 94.72
Total CHOL/HDL Ratio: 4
Triglycerides: 294 mg/dL — ABNORMAL HIGH (ref 0.0–149.0)
VLDL: 58.8 mg/dL — ABNORMAL HIGH (ref 0.0–40.0)

## 2021-03-15 LAB — LDL CHOLESTEROL, DIRECT: Direct LDL: 56 mg/dL

## 2021-03-15 MED ORDER — FREESTYLE LIBRE 2 SENSOR MISC: 1.0000 | 3 refills | Status: DC

## 2021-03-15 MED ORDER — FREESTYLE LIBRE 2 READER DEVI: 1.0000 | Freq: Once | 1 refills | Status: AC

## 2021-03-15 MED ORDER — TRESIBA FLEXTOUCH 200 UNIT/ML ~~LOC~~ SOPN: 55.0000 [IU] | PEN_INJECTOR | Freq: Every day | SUBCUTANEOUS | 3 refills | Status: DC

## 2021-03-15 NOTE — Patient Instructions (Addendum)
Your blood pressure is high today.  Please see a primary care provider soon, to have it rechecked I have sent a prescription to your pharmacy, to reduce the Tresiba to 55 units per day.  This is a slow time-release insulin.  This way, if you miss it, you can make it up later.  check your blood sugar twice a day.  vary the time of day when you check, between before the 3 meals, and at bedtime.  also check if you have symptoms of your blood sugar being too high or too low.  please keep a record of the readings and bring it to your next appointment here (or you can bring the meter itself).  You can write it on any piece of paper.  please call us sooner if your blood sugar goes below 70, or if you have a lot of readings over 200.   Also, I have sent a prescription to your pharmacy, for the continuous glucose monitor.  Please come back for a follow-up appointment in January.

## 2021-03-15 NOTE — Progress Notes (Signed)
Subjective:    Patient ID: Teresa Parks, female    DOB: [birth year], 50 y.o.   MRN: [id number]  HPI Pt returns for f/u of diabetes mellitus: DM type: Insulin-requiring type 2 Dx'ed: 123456 Complications: none Therapy: insulin since dx, and Ozempic. GDM: never DKA: never Severe hypoglycemia: never.  Pancreatitis: once, in 2015 Pancreatic imaging: normal on 2016 [place] SDOH: she works 1st shift now; she was changed to [place], due to missing insulin doses.  Other: she also has severe hypertriglyceridemia; she declines multiple daily injections.  She stopped metformin, because she says it was increasing glucose.   Interval history: no cbg record, but states cbg's vary from 90-120.  No recent steroids. She takes other meds as rx'ed.  She takes 60-70 units of insulin qd.   Past Medical History:  Diagnosis Date   Chronic ear infection    Diabetes mellitus type II, controlled ([place])    HTN (hypertension)    Hyperlipidemia    Pancreatitis, acute     Past Surgical History:  Procedure Laterality Date   KNEE ARTHROSCOPY Right    UMBILICAL HERNIA REPAIR     WISDOM TOOTH EXTRACTION      Social History   Socioeconomic History   Marital status: Married    Spouse name: Not on file   Number of children: 1   Years of education: Not on file   Highest education level: Not on file  Occupational History   Occupation: Chief Financial Officer  Tobacco Use   Smoking status: Every Day    Types: Cigarettes   Smokeless tobacco: Never  Vaping Use   Vaping Use: Never used  Substance and Sexual Activity   Alcohol use: No    Alcohol/week: 0.0 standard drinks   Drug use: No   Sexual activity: Not on file  Other Topics Concern   Not on file  Social History Narrative   Not on file   Social Determinants of Health   Financial Resource Strain: Not on file  Food Insecurity: Not on file  Transportation Needs: Not on file  Physical Activity: Not on file  Stress: Not on file  Social Connections:  Not on file  Intimate Partner Violence: Not on file    Current Outpatient Medications on File Prior to Visit  Medication Sig Dispense Refill   atorvastatin (LIPITOR) 40 MG tablet Take 1 tablet by mouth once daily 30 tablet 3   cyclobenzaprine (FLEXERIL) 10 MG tablet TAKE 1 TABLET BY MOUTH AT BEDTIME 30 tablet 0   fenofibrate 160 MG tablet Take 1 tablet by mouth once daily 30 tablet 3   glucose blood test strip Use as instructed 100 each 12   Icosapent Ethyl (VASCEPA) 0.5 g CAPS Take 4 capsules by mouth daily. 360 capsule 3   Insulin Pen Needle 31G X 8 MM MISC 1 each by Does not apply route daily. Use to inject insulin daily; E11.65 90 each 0   Lancets (FREESTYLE) lancets Use as instructed 100 each 12   meloxicam (MOBIC) 15 MG tablet Take 1 tablet by mouth once daily 15 tablet 0   olmesartan (BENICAR) 5 MG tablet Take 1 tablet by mouth once daily 30 tablet 3   Semaglutide, 1 MG/DOSE, (OZEMPIC, 1 MG/DOSE,) 2 MG/1.5ML SOPN Inject 2 mg into the skin once a week. 6 mL 11   No current facility-administered medications on file prior to visit.    Allergies  Allergen Reactions   Novocain [Procaine] Nausea And Vomiting    Family History  Problem Relation Age of Onset   Heart attack Father 42       Deceased   Diabetes Father    Diabetes Mother    Hypertension Mother        Living   Alzheimer's disease Maternal Grandfather    Healthy Brother    Healthy Daughter    Diabetes Paternal Uncle     BP (!) 180/100 (BP Location: Right Arm, Patient Position: Sitting, Cuff Size: Normal)   Pulse 86   Ht '5\' 4"'$  (1.626 m)   Wt 199 lb 9.6 oz (90.5 kg)   SpO2 97%   BMI 34.26 kg/m    Review of Systems She denies hypoglycemia    Objective:   Physical Exam Pulses: dorsalis pedis intact bilat.   MSK: no deformity of the feet CV: no leg edema Skin:  no ulcer on the feet.  normal color and temp on the feet.   Neuro: sensation is intact to touch on the feet.  Ext: there is bilateral  onychomycosis of the toenails.    A1c=6.3%  Lab Results  Component Value Date   CREATININE 0.87 05/27/2020   BUN 13 05/27/2020   NA 136 05/27/2020   K 3.5 05/27/2020   CL 100 05/27/2020   CO2 29 05/27/2020   Lab Results  Component Value Date   TSH 1.85 05/27/2020      Assessment & Plan:  Insulin-requiring type 2 DM: overcontrolled.    Patient Instructions  Your blood pressure is high today.  Please see a primary care provider soon, to have it rechecked I have sent a prescription to your pharmacy, to reduce the Tresiba to 55 units per day.  This is a slow time-release insulin.  This way, if you miss it, you can make it up later.  check your blood sugar twice a day.  vary the time of day when you check, between before the 3 meals, and at bedtime.  also check if you have symptoms of your blood sugar being too high or too low.  please keep a record of the readings and bring it to your next appointment here (or you can bring the meter itself).  You can write it on any piece of paper.  please call us sooner if your blood sugar goes below 70, or if you have a lot of readings over 200.   Also, I have sent a prescription to your pharmacy, for the continuous glucose monitor.  Please come back for a follow-up appointment in January.

## 2021-03-20 ENCOUNTER — Encounter: Payer: Self-pay | Admitting: Endocrinology

## 2021-03-21 ENCOUNTER — Other Ambulatory Visit: Payer: Self-pay | Admitting: Family

## 2021-03-21 ENCOUNTER — Other Ambulatory Visit: Payer: Self-pay | Admitting: Endocrinology

## 2021-03-21 MED ORDER — DEXCOM G6 RECEIVER DEVI: 1.0000 | Freq: Once | 1 refills | Status: AC

## 2021-03-21 MED ORDER — DEXCOM G6 SENSOR MISC: 1.0000 | 3 refills | Status: DC

## 2021-03-21 MED ORDER — DEXCOM G6 TRANSMITTER MISC: 1.0000 | Freq: Once | 1 refills | Status: AC

## 2021-03-23 DIAGNOSIS — L648 Other androgenic alopecia: Secondary | ICD-10-CM | POA: Diagnosis not present

## 2021-03-27 ENCOUNTER — Telehealth: Payer: Self-pay | Admitting: Registered Nurse

## 2021-03-27 ENCOUNTER — Other Ambulatory Visit: Payer: Self-pay

## 2021-03-27 DIAGNOSIS — M47816 Spondylosis without myelopathy or radiculopathy, lumbar region: Secondary | ICD-10-CM

## 2021-03-27 MED ORDER — CYCLOBENZAPRINE HCL 10 MG PO TABS: 10.0000 mg | ORAL_TABLET | Freq: Every day | ORAL | 0 refills | Status: DC

## 2021-03-27 NOTE — Telephone Encounter (Signed)
Rx filled and sent to patient pharmacy 

## 2021-03-27 NOTE — Telephone Encounter (Signed)
Patient needs her Flexeril refilled and sent to [facility] - Patient has appointment with Delfino Lovett 06/24/2021 -

## 2021-04-20 ENCOUNTER — Encounter: Payer: Self-pay | Admitting: Endocrinology

## 2021-04-27 ENCOUNTER — Other Ambulatory Visit: Payer: Self-pay

## 2021-04-27 DIAGNOSIS — E1165 Type 2 diabetes mellitus with hyperglycemia: Secondary | ICD-10-CM

## 2021-04-27 MED ORDER — FENOFIBRATE 160 MG PO TABS
160.0000 mg | ORAL_TABLET | Freq: Every day | ORAL | 1 refills | Status: DC
Start: 2021-04-27 — End: 2021-08-17

## 2021-04-27 MED ORDER — OLMESARTAN MEDOXOMIL 5 MG PO TABS: 5.0000 mg | ORAL_TABLET | Freq: Every day | ORAL | 1 refills | Status: DC

## 2021-05-31 ENCOUNTER — Other Ambulatory Visit: Payer: Self-pay | Admitting: Family

## 2021-05-31 DIAGNOSIS — M47816 Spondylosis without myelopathy or radiculopathy, lumbar region: Secondary | ICD-10-CM

## 2021-06-26 ENCOUNTER — Encounter: Payer: BC Managed Care – PPO | Admitting: Registered Nurse

## 2021-08-17 ENCOUNTER — Encounter: Payer: Self-pay | Admitting: Registered Nurse

## 2021-08-17 ENCOUNTER — Other Ambulatory Visit: Payer: Self-pay

## 2021-08-17 ENCOUNTER — Ambulatory Visit (INDEPENDENT_AMBULATORY_CARE_PROVIDER_SITE_OTHER): Payer: BC Managed Care – PPO | Admitting: Registered Nurse

## 2021-08-17 VITALS — BP 192/88 | HR 89 | Temp 98.4°F | Resp 18 | Ht 64.0 in | Wt 200.4 lb

## 2021-08-17 DIAGNOSIS — E781 Pure hyperglyceridemia: Secondary | ICD-10-CM

## 2021-08-17 DIAGNOSIS — E1165 Type 2 diabetes mellitus with hyperglycemia: Secondary | ICD-10-CM

## 2021-08-17 DIAGNOSIS — E785 Hyperlipidemia, unspecified: Secondary | ICD-10-CM

## 2021-08-17 DIAGNOSIS — I10 Essential (primary) hypertension: Secondary | ICD-10-CM

## 2021-08-17 DIAGNOSIS — F17200 Nicotine dependence, unspecified, uncomplicated: Secondary | ICD-10-CM

## 2021-08-17 DIAGNOSIS — M47816 Spondylosis without myelopathy or radiculopathy, lumbar region: Secondary | ICD-10-CM

## 2021-08-17 DIAGNOSIS — E1169 Type 2 diabetes mellitus with other specified complication: Secondary | ICD-10-CM | POA: Diagnosis not present

## 2021-08-17 DIAGNOSIS — H6690 Otitis media, unspecified, unspecified ear: Secondary | ICD-10-CM

## 2021-08-17 DIAGNOSIS — Z122 Encounter for screening for malignant neoplasm of respiratory organs: Secondary | ICD-10-CM

## 2021-08-17 LAB — COMPREHENSIVE METABOLIC PANEL
ALT: 13 U/L (ref 0–35)
AST: 13 U/L (ref 0–37)
Albumin: 3.8 g/dL (ref 3.5–5.2)
Alkaline Phosphatase: 99 U/L (ref 39–117)
BUN: 10 mg/dL (ref 6–23)
CO2: 29 mEq/L (ref 19–32)
Calcium: 9 mg/dL (ref 8.4–10.5)
Chloride: 99 mEq/L (ref 96–112)
Creatinine, Ser: 0.64 mg/dL (ref 0.40–1.20)
GFR: 101.97 mL/min (ref >60.00)
Glucose, Bld: 162 mg/dL — ABNORMAL HIGH (ref 70–99)
Potassium: 4 mEq/L (ref 3.5–5.1)
Sodium: 137 mEq/L (ref 135–145)
Total Bilirubin: 0.4 mg/dL (ref 0.2–1.2)
Total Protein: 7.1 g/dL (ref 6.0–8.3)

## 2021-08-17 LAB — CBC WITH DIFFERENTIAL/PLATELET
Basophils Absolute: 0.1 10*3/uL (ref 0.0–0.1)
Basophils Relative: 1.2 % (ref 0.0–3.0)
Eosinophils Absolute: 0.2 10*3/uL (ref 0.0–0.7)
Eosinophils Relative: 3.5 % (ref 0.0–5.0)
HCT: 43 % (ref 36.0–46.0)
Hemoglobin: 14.4 g/dL (ref 12.0–15.0)
Lymphocytes Relative: 22.6 % (ref 12.0–46.0)
Lymphs Abs: 1.6 10*3/uL (ref 0.7–4.0)
MCHC: 33.4 g/dL (ref 30.0–36.0)
MCV: 84.5 fl (ref 78.0–100.0)
Monocytes Absolute: 0.4 10*3/uL (ref 0.1–1.0)
Monocytes Relative: 5.3 % (ref 3.0–12.0)
Neutro Abs: 4.8 10*3/uL (ref 1.4–7.7)
Neutrophils Relative %: 67.4 % (ref 43.0–77.0)
Platelets: 377 10*3/uL (ref 150.0–400.0)
RBC: 5.09 Mil/uL (ref 3.87–5.11)
RDW: 18.4 % — ABNORMAL HIGH (ref 11.5–15.5)
WBC: 7.1 10*3/uL (ref 4.0–10.5)

## 2021-08-17 LAB — LIPID PANEL
Cholesterol: 170 mg/dL (ref 0–200)
HDL: 26.6 mg/dL — ABNORMAL LOW (ref >39.00)
Total CHOL/HDL Ratio: 6
Triglycerides: 910 mg/dL — ABNORMAL HIGH (ref 0.0–149.0)

## 2021-08-17 LAB — LDL CHOLESTEROL, DIRECT: Direct LDL: 30 mg/dL

## 2021-08-17 LAB — TSH: TSH: 1.97 u[IU]/mL (ref 0.35–5.50)

## 2021-08-17 LAB — HEMOGLOBIN A1C: Hgb A1c MFr Bld: 9.2 % — ABNORMAL HIGH (ref 4.6–6.5)

## 2021-08-17 MED ORDER — OZEMPIC (1 MG/DOSE) 2 MG/1.5ML ~~LOC~~ SOPN: 2.0000 mg | PEN_INJECTOR | SUBCUTANEOUS | 11 refills | Status: DC

## 2021-08-17 MED ORDER — AMLODIPINE-OLMESARTAN 10-40 MG PO TABS
1.0000 | ORAL_TABLET | Freq: Every day | ORAL | 3 refills | Status: DC
Start: 2021-08-17 — End: 2022-12-22

## 2021-08-17 MED ORDER — CYCLOBENZAPRINE HCL 10 MG PO TABS: 10.0000 mg | ORAL_TABLET | Freq: Every day | ORAL | 0 refills | Status: DC

## 2021-08-17 MED ORDER — AMOXICILLIN-POT CLAVULANATE 875-125 MG PO TABS: 1.0000 | ORAL_TABLET | Freq: Two times a day (BID) | ORAL | 0 refills | Status: DC

## 2021-08-17 MED ORDER — ICOSAPENT ETHYL 0.5 G PO CAPS: 4.0000 | ORAL_CAPSULE | Freq: Every day | ORAL | 3 refills | Status: DC

## 2021-08-17 MED ORDER — FENOFIBRATE 160 MG PO TABS: 160.0000 mg | ORAL_TABLET | Freq: Every day | ORAL | 1 refills | Status: AC

## 2021-08-17 MED ORDER — TRESIBA FLEXTOUCH 200 UNIT/ML ~~LOC~~ SOPN: 55.0000 [IU] | PEN_INJECTOR | Freq: Every day | SUBCUTANEOUS | 3 refills | Status: DC

## 2021-08-17 NOTE — Patient Instructions (Addendum)
Ms. Johnna Bollier to meet you  Increase bp meds - stop olmesartan, start amlodipine-olmesartan once daily, ideally in the mornings.  Return for BP check in 2-3 weeks. Nurse visit ok  See you in 6 mo to recheck chronic conditions if BP controlled   Thank you  Rich     If you have lab work done today you will be contacted with your lab results within the next 2 weeks.  If you have not heard from [place] then please contact us. The fastest way to get your results is to register for My Chart.   IF you received an x-ray today, you will receive an invoice from [facility]. Please contact [facility] at [phone] with questions or concerns regarding your invoice.   IF you received labwork today, you will receive an invoice from [facility]. Please contact [facility] at [phone] with questions or concerns regarding your invoice.   Our billing staff will not be able to assist you with questions regarding bills from these companies.  You will be contacted with the lab results as soon as they are available. The fastest way to get your results is to activate your My Chart account. Instructions are located on the last page of this paperwork. If you have not heard from [place] regarding the results in 2 weeks, please contact this office.

## 2021-08-17 NOTE — Progress Notes (Signed)
Established Patient Office Visit  Subjective:  Patient ID: Teresa Parks, female    DOB: [birth year]  Age: 51 y.o. MRN: [id number]  CC:  Chief Complaint  Patient presents with   Transitions Of Care    Patient states she is here for a TOC. Patient states her whole family has had strep she keeps one of the babies and would just like her throat looked at.    HPI Teresa Parks Nessel presents for [employer]  Formerly pt of Elyn Aquas PA-C  T2dm - followed by Dr. Loanne Drilling Last A1c:  Lab Results  Component Value Date   HGBA1C 6.3 (A) 03/15/2021    Currently taking: ozempic 2mg  subq weekly, glargine 56 units nightly.  No new complications Reports good compliance with medications Diet has been steady Exercise habits have been steady  Hypertension: Patient Currently taking: olmesartan 5mg  po qd Good effect. No AEs. Denies CV symptoms including: chest pain, shob, doe, headache, visual changes, fatigue, claudication, and dependent edema.   Previous readings and labs: BP Readings from Last 3 Encounters:  08/17/21 (!) 192/88  03/15/21 (!) 180/100  10/24/20 (!) 166/86   Lab Results  Component Value Date   CREATININE 0.87 05/27/2020   Hypertriglyceridemia On vascepa 0.5mg  caps po qid, fenofibrate 160mg  po qd Could not tolerate statin No AE from vascepa or fenofibrate Lab Results  Component Value Date   CHOL 124 03/15/2021   HDL 28.90 (L) 03/15/2021   LDLCALC NOT CALC 09/19/2015   LDLDIRECT 56.0 03/15/2021   TRIG 294.0 (H) 03/15/2021   CHOLHDL 4 03/15/2021    Acute upper respiratory infection Congestion, sore throat, multiple sick contacts with bacterial infections. Has gone on for around a week Progressively worse symptoms No fevers chills sweats fatigue malaise nvd shob doe.  Smoking hx Working on quitting on her own Taken chantix and wellbutrin in the past without effect. She has smoked around 1 ppd for 35 or so years. She has not yet had lung ca screening.  Otherwise no  concerns. Doing well overall.   Past Medical History:  Diagnosis Date   Chronic ear infection    Diabetes mellitus type II, controlled ([place])    HTN (hypertension)    Hyperlipidemia    Pancreatitis, acute     Past Surgical History:  Procedure Laterality Date   KNEE ARTHROSCOPY Right    UMBILICAL HERNIA REPAIR     WISDOM TOOTH EXTRACTION      Family History  Problem Relation Age of Onset   Heart attack Father 98       Deceased   Diabetes Father    Diabetes Mother    Hypertension Mother        Living   Alzheimer's disease Maternal Grandfather    Healthy Brother    Healthy Daughter    Diabetes Paternal Uncle     Social History   Socioeconomic History   Marital status: Married    Spouse name: Not on file   Number of children: 1   Years of education: Not on file   Highest education level: Not on file  Occupational History   Occupation: Chief Financial Officer  Tobacco Use   Smoking status: Every Day    Packs/day: 1.00    Years: 35.00    Pack years: 35.00    Types: Cigarettes    Start date: 1988   Smokeless tobacco: Never  Vaping Use   Vaping Use: Never used  Substance and Sexual Activity   Alcohol use: No  Alcohol/week: 0.0 standard drinks   Drug use: No   Sexual activity: Not on file  Other Topics Concern   Not on file  Social History Narrative   Not on file   Social Determinants of Health   Financial Resource Strain: Not on file  Food Insecurity: Not on file  Transportation Needs: Not on file  Physical Activity: Not on file  Stress: Not on file  Social Connections: Not on file  Intimate Partner Violence: Not on file    Outpatient Medications Prior to Visit  Medication Sig Dispense Refill   Continuous Blood Gluc Sensor (DEXCOM G6 SENSOR) MISC 1 Device by Does not apply route See admin instructions. Change every 10 days 9 each 3   glucose blood test strip Use as instructed 100 each 12   Insulin Pen Needle 31G X 8 MM MISC 1 each by Does not apply  route daily. Use to inject insulin daily; E11.65 90 each 0   Lancets (FREESTYLE) lancets Use as instructed 100 each 12   atorvastatin (LIPITOR) 40 MG tablet Take 1 tablet by mouth once daily 30 tablet 3   cyclobenzaprine (FLEXERIL) 10 MG tablet TAKE 1 TABLET BY MOUTH AT BEDTIME 30 tablet 0   fenofibrate 160 MG tablet Take 1 tablet (160 mg total) by mouth daily. 30 tablet 1   Icosapent Ethyl (VASCEPA) 0.5 g CAPS Take 4 capsules by mouth daily. 360 capsule 3   insulin degludec (TRESIBA FLEXTOUCH) 200 UNIT/ML FlexTouch Pen Inject 56 Units into the skin daily. And pen needles 1/day 30 mL 3   olmesartan (BENICAR) 5 MG tablet Take 1 tablet (5 mg total) by mouth daily. 30 tablet 1   Semaglutide, 1 MG/DOSE, (OZEMPIC, 1 MG/DOSE,) 2 MG/1.5ML SOPN Inject 2 mg into the skin once a week. 6 mL 11   meloxicam (MOBIC) 15 MG tablet Take 1 tablet by mouth once daily (Patient not taking: Reported on 08/17/2021) 15 tablet 0   No facility-administered medications prior to visit.    Allergies  Allergen Reactions   Novocain [Procaine] Nausea And Vomiting    ROS Review of Systems  Constitutional: Negative.   HENT:  Positive for congestion, sinus pressure and sore throat.   Eyes: Negative.   Respiratory: Negative.    Cardiovascular: Negative.   Gastrointestinal: Negative.   Genitourinary: Negative.   Musculoskeletal: Negative.   Skin: Negative.   Neurological: Negative.   Psychiatric/Behavioral: Negative.    All other systems reviewed and are negative.    Objective:    Physical Exam Vitals and nursing note reviewed.  Constitutional:      General: She is not in acute distress.    Appearance: Normal appearance. She is normal weight. She is not ill-appearing, toxic-appearing or diaphoretic.  HENT:     Right Ear: Tympanic membrane, ear canal and external ear normal. There is no impacted cerumen.     Left Ear: Tympanic membrane, ear canal and external ear normal. There is no impacted cerumen.     Nose:  Congestion and rhinorrhea present.     Mouth/Throat:     Mouth: Mucous membranes are moist.     Pharynx: Oropharynx is clear. Posterior oropharyngeal erythema present. No oropharyngeal exudate.  Cardiovascular:     Rate and Rhythm: Normal rate and regular rhythm.     Heart sounds: Normal heart sounds. No murmur heard.   No friction rub. No gallop.  Pulmonary:     Effort: Pulmonary effort is normal. No respiratory distress.     Breath  sounds: Normal breath sounds. No stridor. No wheezing, rhonchi or rales.  Chest:     Chest wall: No tenderness.  Skin:    General: Skin is warm and dry.  Neurological:     General: No focal deficit present.     Mental Status: She is alert and oriented to person, place, and time. Mental status is at baseline.  Psychiatric:        Mood and Affect: Mood normal.        Behavior: Behavior normal.        Thought Content: Thought content normal.        Judgment: Judgment normal.    BP (!) 192/88    Pulse 89    Temp 98.4 F (36.9 C) (Temporal)    Resp 18    Ht 5\' 4"  (1.626 m)    Wt 200 lb 6.4 oz (90.9 kg)    SpO2 99%    BMI 34.40 kg/m  Wt Readings from Last 3 Encounters:  08/17/21 200 lb 6.4 oz (90.9 kg)  03/15/21 199 lb 9.6 oz (90.5 kg)  10/24/20 205 lb 6.4 oz (93.2 kg)     Health Maintenance Due  Topic Date Due   COVID-19 Vaccine (1) Never done   HIV Screening  Never done   Zoster Vaccines- Shingrix (1 of 2) Never done    There are no preventive care reminders to display for this patient.  Lab Results  Component Value Date   TSH 1.85 05/27/2020   Lab Results  Component Value Date   WBC 10.2 04/02/2018   HGB 14.5 04/02/2018   HCT 43.4 04/02/2018   MCV 86.9 04/02/2018   PLT 452.0 (H) 04/02/2018   Lab Results  Component Value Date   NA 136 05/27/2020   K 3.5 05/27/2020   CO2 29 05/27/2020   GLUCOSE 239 (H) 05/27/2020   BUN 13 05/27/2020   CREATININE 0.87 05/27/2020   BILITOT 0.7 04/02/2018   ALKPHOS 101 04/02/2018   AST 14  04/02/2018   ALT 11 04/02/2018   PROT 7.5 04/02/2018   ALBUMIN 3.8 04/02/2018   CALCIUM 8.4 05/27/2020   ANIONGAP 8 01/05/2017   GFR 77.54 05/27/2020   Lab Results  Component Value Date   CHOL 124 03/15/2021   Lab Results  Component Value Date   HDL 28.90 (L) 03/15/2021   Lab Results  Component Value Date   LDLCALC NOT CALC 09/19/2015   Lab Results  Component Value Date   TRIG 294.0 (H) 03/15/2021   Lab Results  Component Value Date   CHOLHDL 4 03/15/2021   Lab Results  Component Value Date   HGBA1C 6.3 (A) 03/15/2021      Assessment & Plan:   Problem List Items Addressed This Visit       Endocrine   Type II diabetes mellitus, uncontrolled   Relevant Medications   Icosapent Ethyl (VASCEPA) 0.5 g CAPS   insulin degludec (TRESIBA FLEXTOUCH) 200 UNIT/ML FlexTouch Pen   amLODipine-olmesartan (AZOR) 10-40 MG tablet   Semaglutide, 1 MG/DOSE, (OZEMPIC, 1 MG/DOSE,) 2 MG/1.5ML SOPN   Other Relevant Orders   Comprehensive metabolic panel   Hemoglobin A1c   CBC with Differential/Platelet   Lipid panel   TSH   Hyperlipidemia associated with type 2 diabetes mellitus (HCC)   Relevant Medications   fenofibrate 160 MG tablet   Icosapent Ethyl (VASCEPA) 0.5 g CAPS   insulin degludec (TRESIBA FLEXTOUCH) 200 UNIT/ML FlexTouch Pen   amLODipine-olmesartan (AZOR) 10-40 MG tablet  Semaglutide, 1 MG/DOSE, (OZEMPIC, 1 MG/DOSE,) 2 MG/1.5ML SOPN   Other Relevant Orders   Comprehensive metabolic panel   Hemoglobin A1c   CBC with Differential/Platelet   Lipid panel   TSH     Musculoskeletal and Integument   DJD (degenerative joint disease), lumbar   Relevant Medications   cyclobenzaprine (FLEXERIL) 10 MG tablet     Other   Hypertriglyceridemia   Relevant Medications   fenofibrate 160 MG tablet   Icosapent Ethyl (VASCEPA) 0.5 g CAPS   amLODipine-olmesartan (AZOR) 10-40 MG tablet   Other Relevant Orders   Comprehensive metabolic panel   Hemoglobin A1c   CBC with  Differential/Platelet   Lipid panel   TSH   Other Visit Diagnoses     Primary hypertension    -  Primary   Relevant Medications   fenofibrate 160 MG tablet   Icosapent Ethyl (VASCEPA) 0.5 g CAPS   amLODipine-olmesartan (AZOR) 10-40 MG tablet   Other Relevant Orders   Comprehensive metabolic panel   Hemoglobin A1c   CBC with Differential/Platelet   Lipid panel   TSH   Encounter for screening for malignant neoplasm of lung in current smoker with 30 pack year history or greater       Relevant Orders   Ambulatory Referral Lung Cancer Screening [facility]   Acute otitis media, unspecified otitis media type       Relevant Medications   amoxicillin-clavulanate (AUGMENTIN) 875-125 MG tablet       Meds ordered this encounter  Medications   fenofibrate 160 MG tablet    Sig: Take 1 tablet (160 mg total) by mouth daily.    Dispense:  90 tablet    Refill:  1    Order Specific Question:   Supervising Provider    Answer:   Carlota Raspberry, JEFFREY R [[id number]]   Icosapent Ethyl (VASCEPA) 0.5 g CAPS    Sig: Take 4 capsules by mouth daily.    Dispense:  360 capsule    Refill:  3    Order Specific Question:   Supervising Provider    Answer:   Carlota Raspberry, JEFFREY R [[id number]]   insulin degludec (TRESIBA FLEXTOUCH) 200 UNIT/ML FlexTouch Pen    Sig: Inject 56 Units into the skin daily. And pen needles 1/day    Dispense:  30 mL    Refill:  3    Order Specific Question:   Supervising Provider    Answer:   Carlota Raspberry, JEFFREY R [[id number]]   amLODipine-olmesartan (AZOR) 10-40 MG tablet    Sig: Take 1 tablet by mouth daily.    Dispense:  90 tablet    Refill:  3    Order Specific Question:   Supervising Provider    Answer:   Carlota Raspberry, JEFFREY R [[id number]]   Semaglutide, 1 MG/DOSE, (OZEMPIC, 1 MG/DOSE,) 2 MG/1.5ML SOPN    Sig: Inject 2 mg into the skin once a week.    Dispense:  6 mL    Refill:  11    Order Specific Question:   Supervising Provider    Answer:   Carlota Raspberry, JEFFREY R [[id number]]   cyclobenzaprine  (FLEXERIL) 10 MG tablet    Sig: Take 1 tablet (10 mg total) by mouth at bedtime.    Dispense:  30 tablet    Refill:  0    Order Specific Question:   Supervising Provider    Answer:   Carlota Raspberry, JEFFREY R [[id number]]   amoxicillin-clavulanate (AUGMENTIN) 875-125 MG tablet    Sig: Take 1 tablet by mouth  2 (two) times daily.    Dispense:  20 tablet    Refill:  0    Order Specific Question:   Supervising Provider    Answer:   Carlota Raspberry, JEFFREY R [[id number]]    Follow-up: Return in about 3 weeks (around 09/07/2021) for nurse visit BP check - if poorly controlled, med check 3 mo. if in control, 6 mo f/u w provider.Marland Kitchen   PLAN Augmentin po bid x 10 days for upper respiratory infection Increase bp med to amlodipine-olmesartan 10-40mg  po qd. Discussed risks, benefits, AE. Return in 2-3 weeks for nurse visit bp check, 3 mo if not at goal, 6 mo if at goal with provider. T2dm has been under control. Recheck today. Maintain current regimen unless labs indicate otehrwise. Last lipids showed continued hypertriglyceridemia. Will continue to monitor. Consider referral to lipid disorders if not adequately controlled. Refer to lung ca screen for age 32+ active smoker with 30+ pack year hx. Patient encouraged to call clinic with any questions, comments, or concerns.  I spent 62 minutes with this patient in chart review, taking history, performing exam, coordinating medication changes, and placing orders.   Maximiano Coss, NP

## 2021-08-18 ENCOUNTER — Other Ambulatory Visit: Payer: Self-pay | Admitting: Registered Nurse

## 2021-08-18 DIAGNOSIS — E1165 Type 2 diabetes mellitus with hyperglycemia: Secondary | ICD-10-CM

## 2021-08-18 MED ORDER — EMPAGLIFLOZIN 25 MG PO TABS: 25.0000 mg | ORAL_TABLET | Freq: Every day | ORAL | 1 refills | Status: DC

## 2021-08-22 ENCOUNTER — Encounter: Payer: Self-pay | Admitting: Registered Nurse

## 2021-08-22 ENCOUNTER — Telehealth: Payer: Self-pay

## 2021-08-22 ENCOUNTER — Other Ambulatory Visit (HOSPITAL_COMMUNITY): Payer: Self-pay

## 2021-08-22 NOTE — Telephone Encounter (Signed)
Patient Advocate Encounter   Received notification from [facility] that prior authorization for Ozempic 8mg /70ml pen injectors is required by his/her insurance [facility].   PA submitted on 08/22/21  Key#: [id number]  Status is pending    [facility] will continue to follow:  Patient Advocate Fax: [phone]

## 2021-08-22 NOTE — Telephone Encounter (Signed)
Patient Advocate Encounter  Prior Authorization for Ozempic 8mg /80ml pen injectors  has been approved.    PA# N/A  Effective dates: 08/22/21 through 08/21/22  Per Test Claim Patients co-pay is $917.30.   Spoke with Pharmacy to Process.  Patient Advocate Fax: [phone]

## 2021-08-23 NOTE — Telephone Encounter (Signed)
Hi Teresa Parks, One thing I forgot to ask you about is a that back last year when we were out of town I started having some issues  when I burped. For about 2 weeks when I burped it smelled like rotten eggs. I thought it was something that I ate at the beginning of our vacation because I felt sick for days. It did go away but came back about 4 months later for about a week and then went away again. Now it is back again, started yesterday after about 3 months.  What could that be?

## 2021-09-26 ENCOUNTER — Other Ambulatory Visit: Payer: Self-pay | Admitting: Registered Nurse

## 2021-09-26 ENCOUNTER — Telehealth: Payer: Self-pay

## 2021-09-26 DIAGNOSIS — H6690 Otitis media, unspecified, unspecified ear: Secondary | ICD-10-CM

## 2021-09-26 NOTE — Telephone Encounter (Signed)
Caller name:Danyelle Bodey  ? ?On DPR? :Yes ? ?Call back [phone] ? ?Provider they see: Delfino Lovett ? ?Reason for call:[employer] is calling one question to get pro authorization approved can be reached back at [phone] option 3, 4 then 1  ? ?

## 2021-09-28 NOTE — Telephone Encounter (Signed)
Any idea on this? ? ?Thanks, ? ?Rich

## 2021-11-05 ENCOUNTER — Encounter: Payer: Self-pay | Admitting: Registered Nurse

## 2021-11-08 ENCOUNTER — Other Ambulatory Visit: Payer: Self-pay | Admitting: Registered Nurse

## 2021-11-08 DIAGNOSIS — Z8719 Personal history of other diseases of the digestive system: Secondary | ICD-10-CM

## 2021-11-20 ENCOUNTER — Encounter: Payer: Self-pay | Admitting: Nurse Practitioner

## 2021-11-27 ENCOUNTER — Telehealth: Payer: Self-pay

## 2021-11-27 NOTE — Telephone Encounter (Signed)
Attempted contact patient, lvm to call back.  ? ?Nurse Assessment ?Nurse: Michae Kava, RN, Olivia Date/Time (Eastern Time): 11/24/2021 5:03:26 PM ?Confirm and document reason for call. If symptomatic, describe symptoms. ?---Caller states believes she has strep and would like to know if an antibiotic could be called in? Daughter and grandchildren both have it. Constant coughing and scratchy throat and hurting when she is coughing. Denies fever ? ?Does the patient have any new or worsening symptoms? ---Yes ?Will a triage be completed? ---Yes ?Related visit to physician within the last 2 weeks? ---No ?Does the PT have any chronic conditions? (i.e. diabetes, asthma, this includes High risk factors for pregnancy, etc.) ---Yes ?List chronic conditions. ---diabetes ?Is the patient pregnant or possibly pregnant? (Ask all females between the ages of 49-55) ---No ?Is this a behavioral health or substance abuse call? ---No ? ?Cough - Acute NonProductive ?Cough with cold symptoms (e.g., runny nose, postnasal drip, throat clearing) ?[place], RN, [place] 11/24/2021 5:06:55 PM ? ?Strep Throat Exposure ?[1] Strep throat EXPOSURE within past 10 days ?[2] sore throat ? ?11/24/2021 5:12:06 [place], RN, Minette Brine ?11/24/2021 5:20:51 PM Called On-Call Provider Michae Kava, RN, Minette Brine ?11/24/2021 5:26:35 PM Attempt made - message left Sh[place]den, RN, Minette Brine ?11/24/2021 5:39:01 PM Attempt made - message left Shurden, RN, Minette Brine ?11/24/2021 5:17:26 PM Call PCP within 24 Hours Yes Stanton, Therapist, sports, Vicenta Dunning ? ?HOME CARE: * You should be able to treat this at home. FOR A RUNNY NOSE - BLOW YOUR NOSE: * Blowing your nose ?helps clean out your nose. Use a handkerchief or a paper tissue. * Nasal mucus and discharge help wash viruses and bacteria out of ?the nose and sinuses. * If the skin around your nostrils gets irritated, apply a tiny amount of petroleum ointment to the nasal openings ?once or twice a day. CALL BACK IF: * Nasal discharge lasts over 10 days *  Fever over 104 F (40 C), or fever lasts over 3 days * ?You become worse CARE ADVICE given per Cough - Acute Non-Productive (Adult) guideline. ?CALL PCP WITHIN 24 HOURS: * You need to discuss this with your doctor (or NP/PA) within the next 24 hours. * IF OFFICE ?WILL BE CLOSED: I'll page the on-call provider now. EXCEPTION: from 9 pm to 9 am. Since this isn't urgent, we'll hold the page ?until morning. * Some physicians are willing to initiate empiric antibiotic therapy for presumed strep pharyngitis over the telephone: ?SORE THROAT: * Here are some simple things you can do to treat and reduce sore throat pain. * Sip warm chicken broth or apple ?juice. * Suck on hard candy or an over-the-counter throat lozenge. * Gargle with warm salt water four times a day. To make salt ?water, put 1/2 teaspoon of salt in 8 oz (240 ml) of warm water. SOFT DIET: * Eat a soft diet. * Cold drinks, popsicles, and milk ?shakes are especially good. Avoid citrus fruits. DRINK PLENTY OF LIQUIDS: * Drink plenty of liquids. It is important to stay ?well-hydrated. CALL BACK IF: * You become worse CARE ADVICE given per Strep Throat Exposure (Adult) guideline ? ? ?Shanon Ace - MD [phone] ?11/24/2021 ?5:20:51 ?PM ?Called On ?Call Provider - ?Reached ?Doctor Paged ?Shanon Ace - MD ?11/24/2021 ?5:26:14 ?PM ?Spoke with On ?Call - General Message Result States that unable to prescribe antibiotic without exam, so will need to go to an urgent care center. OCP also mentioned that d/t cough, she may also have something else going on. Attempted to call patient back  to notify with no response. Will attempt again in 10-15 min. ? ?

## 2021-11-28 NOTE — Telephone Encounter (Signed)
Called pt to schedule an appointment no answer LM asking that she return the call and make an appointment in order to be treated for possible strep throat, in office if preferred so we can do some in office tests to confirm the possible strep throat  ?

## 2021-12-05 NOTE — Progress Notes (Unsigned)
12/05/2021 [name] [id number] 1971-03-15   CHIEF COMPLAINT: Recurrent pancreatitis  HISTORY OF PRESENT ILLNESS: Teresa Parks is a 51 year old female with past medical history of hypertension, hyperlipidemia, pancreatitis, diabetes mellitus type 2, Bell's Palsy and hepatic steatosis. S/P umbilical hernia repair.   She was initially seen in our office by Nicoletta Ba PA-C 04/15/2018 due to having recurrent pancreatitis since 2015.  She reported her first episode of pancreatitis occurred July 2015 which required hospital admission for a few days. She stated her triglyceride levels were high and her diabetes was poorly controlled at that time.  She reported having a second episode of pancreatitis December 2015 and 1-2 episodes yearly through 2019. At the time of her office visit 04/15/2018, she reported having a recent flare of pancreatitis with N/V and upper abdominal pain. An abdominal MRI/MRCP 04/23/2018 identified a 12 mm lesion to the head of the pancreas possibly representing a small pseudocyst or indolent cystic pancreatic neoplasm without evidence of pancreatic edema, peripancreatic inflammatory changes, pancreatic ductal dilatation or pancreatic divisum.  A repeat abdominal MRI in 1 year was recommended but was not done. Her pancreatitis was thought to be secondary to hypertriglyceridemia.  She was advised to to follow-up with her PCP for further hypertriglyceridemia management.   She presents to our office today as referred by Maximiano Coss, NP for further evaluation regarding possible recurrent pancreatitis. She complains of feeling like there is a brick in her stomach every time she eats which started 10/27/2021. She initially felt nauseous then vomited 2 to 3 times. Emesis was clear, no hematemesis or coffee ground emesis. The next day she tolerated Gatorade and protein shakes without further vomiting. She gradually advanced to a low flat bland diet. She continues to have LUQ achy  type pain. No heartburn or dysphagia. She denied having any "pancreatitis attacks" since her office visit in 2019.  No alcohol use.  No family history of pancreatic disorders.  She is passing a normal brown formed stools daily. No constipation. No rectal bleeding or black stools. She denies ever having a screening colonoscopy. A cologuard test was ordered by her PCP 04/2020 but was not completed.   She remains on Fenofibrate 160 mg daily.  Triglyceride level was 910 on 08/17/2021.     Latest Ref Rng & Units 08/17/2021   10:50 AM 04/02/2018    8:50 AM 01/11/2017    9:51 AM  CBC  WBC 4.0 - 10.5 K/uL 7.1   10.2   6.7    Hemoglobin 12.0 - 15.0 g/dL 14.4   14.5   14.3    Hematocrit 36.0 - 46.0 % 43.0   43.4   42.4    Platelets 150.0 - 400.0 K/uL 377.0   452.0   351.0         Latest Ref Rng & Units 08/17/2021   10:50 AM 05/27/2020    4:25 PM 01/27/2019    8:10 AM  CMP  Glucose 70 - 99 mg/dL 162   239   196    BUN 6 - 23 mg/dL '10   13   17    '$ Creatinine 0.40 - 1.20 mg/dL 0.64   0.87   0.71    Sodium 135 - 145 mEq/L 137   136   135    Potassium 3.5 - 5.1 mEq/L 4.0   3.5   4.0    Chloride 96 - 112 mEq/L 99   100   102    CO2 19 -  32 mEq/L '29   29   25    '$ Calcium 8.4 - 10.5 mg/dL 9.0   8.4   9.2    Total Protein 6.0 - 8.3 g/dL 7.1      Total Bilirubin 0.2 - 1.2 mg/dL 0.4      Alkaline Phos 39 - 117 U/L 99      AST 0 - 37 U/L 13      ALT 0 - 35 U/L 13        Lipid Panel 08/17/2021: Cholesterol 170 Triglyceride 910 HDL 26.60                                Abdominal MRI/MRCP 04/23/2018: Lower Chest: No acute findings.   Hepatobiliary: No hepatic masses identified. A tiny sub-cm cyst is seen in the right hepatic lobe. Moderate diffuse hepatic steatosis. Gallbladder is unremarkable. No evidence of biliary ductal dilatation, with common bile duct measuring 4 mm. No evidence of choledocholithiasis.   Pancreas: No evidence of pancreatic edema or peripancreatic inflammatory changes. A small  cystic lesion is seen in the pancreatic head measuring 12 mm on image 73/1304. This could represent a small pseudocyst or indolent cystic pancreatic neoplasm. No evidence of pancreatic ductal dilatation or pancreas divisum.   Spleen: Within normal limits in size and appearance.   Adrenals/Urinary Tract: No masses identified. Small approximately 1 cm cysts are noted in the upper and lower poles of the right kidney. No evidence of hydronephrosis.   Stomach/Bowel: No evidence of obstruction, inflammatory process or abnormal fluid collections.   Vascular/Lymphatic: No pathologically enlarged lymph nodes. No abdominal aortic aneurysm.   Other:  None.   Musculoskeletal:  No suspicious bone lesions identified.   IMPRESSION: No acute findings.   12 mm cystic lesion in the pancreatic head, which may represent a small pseudocyst or indolent cystic neoplasm. Recommend continued follow-up by MRI in 1 year. This recommendation follows ACR consensus guidelines: Management of Incidental Pancreatic Cysts: A White Paper of the ACR Incidental Findings Committee. [place] 5573;22:025-427.   No evidence of biliary ductal dilatation or choledocholithiasis.   Moderate hepatic steatosis.     Past Medical History:  Diagnosis Date   Chronic ear infection    Diabetes mellitus type II, controlled ([place])    HTN (hypertension)    Hyperlipidemia    Pancreatitis, acute    Past Surgical History:  Procedure Laterality Date   KNEE ARTHROSCOPY Right    UMBILICAL HERNIA REPAIR     WISDOM TOOTH EXTRACTION     Social History: She is married.  She has 1 daughter.  She is an Optometrist.  She smokes less than 1 pack of cigarettes daily x 35+ years.  No alcohol use.  No drug use.  Family History: family history includes Alzheimer's disease in her maternal grandfather; Diabetes in her father, mother, and paternal uncle; Healthy in her brother and daughter; Heart attack (age of onset: 33) in her  father; Hypertension in her mother.  No family history of pancreatic disease.  No family history of esophageal, gastric or colon cancer.  Allergies  Allergen Reactions   Novocain [Procaine] Nausea And Vomiting      Outpatient Encounter Medications as of 12/07/2021  Medication Sig   amLODipine-olmesartan (AZOR) 10-40 MG tablet Take 1 tablet by mouth daily.   amoxicillin-clavulanate (AUGMENTIN) 875-125 MG tablet Take 1 tablet by mouth 2 (two) times daily.   Continuous Blood Gluc  Sensor (DEXCOM G6 SENSOR) MISC 1 Device by Does not apply route See admin instructions. Change every 10 days   cyclobenzaprine (FLEXERIL) 10 MG tablet Take 1 tablet (10 mg total) by mouth at bedtime.   empagliflozin (JARDIANCE) 25 MG TABS tablet Take 1 tablet (25 mg total) by mouth daily before breakfast.   fenofibrate 160 MG tablet Take 1 tablet (160 mg total) by mouth daily.   glucose blood test strip Use as instructed   Icosapent Ethyl (VASCEPA) 0.5 g CAPS Take 4 capsules by mouth daily.   insulin degludec (TRESIBA FLEXTOUCH) 200 UNIT/ML FlexTouch Pen Inject 56 Units into the skin daily. And pen needles 1/day   Insulin Pen Needle 31G X 8 MM MISC 1 each by Does not apply route daily. Use to inject insulin daily; E11.65   Lancets (FREESTYLE) lancets Use as instructed   Semaglutide, 1 MG/DOSE, (OZEMPIC, 1 MG/DOSE,) 2 MG/1.5ML SOPN Inject 2 mg into the skin once a week.   No facility-administered encounter medications on file as of 12/07/2021.    REVIEW OF SYSTEMS:  CV: Denies chest pain, palpitations or edema. Resp: + Cough. No shortness of breath of hemoptysis.  GI: Denies heartburn, dysphagia, stomach or lower abdominal pain. No diarrhea or constipation.  GU : Denies urinary burning, blood in urine, increased urinary frequency or incontinence. MS: + Arthritis, back pain.  Derm: Denies rash, itchiness, skin lesions or unhealing ulcers. Psych: Denies depression, anxiety, memory loss, suicidal ideation and  confusion. Heme: Denies bruising, easy bleeding. Neuro:  + Headaches. Endo:  Denies any problems with DM, thyroid or adrenal function.  PHYSICAL EXAM: BP 136/80   Pulse (!) 101   Ht '5\' 4"'$  (1.626 m)   Wt 192 lb (87.1 kg)   BMI 32.96 kg/m  Wt Readings from Last 3 Encounters:  12/07/21 192 lb (87.1 kg)  08/17/21 200 lb 6.4 oz (90.9 kg)  03/15/21 199 lb 9.6 oz (90.5 kg)     General: 51 year old female in no acute distress. Head: Normocephalic and atraumatic.  Facial asymmetry secondary to Bell's palsy. Eyes:  Sclerae non-icteric, conjunctive pink. Ears: Normal auditory acuity. Mouth: Dentition intact. No ulcers or lesions.  Neck: Supple, no lymphadenopathy or thyromegaly.  Lungs: Clear bilaterally to auscultation without wheezes, crackles or rhonchi. Heart: Regular rate and rhythm. No murmur, rub or gallop appreciated.  Abdomen: Soft, nontender, non distended. No masses. No hepatosplenomegaly. Normoactive bowel sounds x 4 quadrants.  Diastasis recti. Rectal: Deferred. Musculoskeletal: Symmetrical with no gross deformities. Skin: Warm and dry. No rash or lesions on visible extremities. Extremities: No edema. Neurological: Alert oriented x 4, no focal deficits.  Psychological:  Alert and cooperative. Normal mood and affect.  ASSESSMENT AND PLAN:  42) 51 year old female with N/V x 1 day with LUQ pain since 10/27/2021. -Famotidine '20mg'$  one tab po QD -EGD to rule out PUD/H. Pylori, benefits and risks discussed including risk with sedation, risk of bleeding, perforation and infection  -See plan in # 2 -Consider gastric empty study if EGD negative   2) History of recurrent pancreatitis 2015 - 2019, likely due to hypertriglyceridemia with recurrent N/V and LUQ pain. Lipase level 573 on 07/04/2015 -> Lipase 257 on 01/05/2017 -> Lipase 63 on 04/02/2018. No recent lipase level. Triglyceride level 910 on 08/17/2021. Abdominal MRI/MRCP 04/2018 identified a 12 mm lesion to the head of the pancreas  possibly representing a small pseudocyst or indolent cystic pancreatic neoplasm without evidence of pancreatic edema, peripancreatic inflammatory changes, pancreatic ductal dilatation or pancreatic divisum -CBC,  CMP, lipase, IgG4 and triglyceride level -Abdominal MRI/MRCP w/wo contrast   3) Hypertriglyceridemia on Fenofibrate.  Triglyceride level 2, 826 in 2015, since then her triglyceride levels have fluctuated from the low 300s - 1000 - 2000's -> 500 to 600's. Her most recent triglyceride level 910 on 08/17/2021. -Fasting triglyceride level as ordered above  -Continue follow-up with PCP  4) Hepatic steatosis per abdominal MRI 2019. Normal LFTs.  -Labs and abdominal MRI as ordered above  -Patient encouraged to eat a healthy low carbohydrate diet, exercise as tolerated and lose weight to reduce the risk of fatty liver disease  5) Chronic smoker -Smoking cessation advised   6) Colon cancer screening.  -Colonoscopy benefits and risks discussed including risk with sedation, risk of bleeding, perforation and infection  -Patient did not wish to purse a screening colonoscopy at this time. -Patient elects to follow up with PCP to consider possible Cologuard test   7) DM II, poorly controlled. Hg A1C 9.2.  -Follow-up with endocrinology Dr. Loanne Drilling        CC:  Maximiano Coss, NP

## 2021-12-07 ENCOUNTER — Encounter: Payer: Self-pay | Admitting: Nurse Practitioner

## 2021-12-07 ENCOUNTER — Ambulatory Visit (INDEPENDENT_AMBULATORY_CARE_PROVIDER_SITE_OTHER): Payer: BC Managed Care – PPO | Admitting: Nurse Practitioner

## 2021-12-07 ENCOUNTER — Other Ambulatory Visit (INDEPENDENT_AMBULATORY_CARE_PROVIDER_SITE_OTHER): Payer: BC Managed Care – PPO

## 2021-12-07 VITALS — BP 136/80 | HR 101 | Ht 64.0 in | Wt 192.0 lb

## 2021-12-07 DIAGNOSIS — R112 Nausea with vomiting, unspecified: Secondary | ICD-10-CM

## 2021-12-07 DIAGNOSIS — E119 Type 2 diabetes mellitus without complications: Secondary | ICD-10-CM

## 2021-12-07 DIAGNOSIS — R1012 Left upper quadrant pain: Secondary | ICD-10-CM

## 2021-12-07 DIAGNOSIS — Z8719 Personal history of other diseases of the digestive system: Secondary | ICD-10-CM

## 2021-12-07 DIAGNOSIS — K862 Cyst of pancreas: Secondary | ICD-10-CM

## 2021-12-07 LAB — CBC WITH DIFFERENTIAL/PLATELET
Basophils Absolute: 0.1 10*3/uL (ref 0.0–0.1)
Basophils Relative: 1.6 % (ref 0.0–3.0)
Eosinophils Absolute: 0.3 10*3/uL (ref 0.0–0.7)
Eosinophils Relative: 3.4 % (ref 0.0–5.0)
HCT: 42.3 % (ref 36.0–46.0)
Hemoglobin: 14.3 g/dL (ref 12.0–15.0)
Lymphocytes Relative: 22.4 % (ref 12.0–46.0)
Lymphs Abs: 1.7 10*3/uL (ref 0.7–4.0)
MCHC: 33.8 g/dL (ref 30.0–36.0)
MCV: 87.1 fl (ref 78.0–100.0)
Monocytes Absolute: 0.4 10*3/uL (ref 0.1–1.0)
Monocytes Relative: 4.5 % (ref 3.0–12.0)
Neutro Abs: 5.3 10*3/uL (ref 1.4–7.7)
Neutrophils Relative %: 68.1 % (ref 43.0–77.0)
Platelets: 324 10*3/uL (ref 150.0–400.0)
RBC: 4.86 Mil/uL (ref 3.87–5.11)
RDW: 17.2 % — ABNORMAL HIGH (ref 11.5–15.5)
WBC: 7.8 10*3/uL (ref 4.0–10.5)

## 2021-12-07 LAB — COMPREHENSIVE METABOLIC PANEL
ALT: 15 U/L (ref 0–35)
AST: 14 U/L (ref 0–37)
Albumin: 3.8 g/dL (ref 3.5–5.2)
Alkaline Phosphatase: 102 U/L (ref 39–117)
BUN: 8 mg/dL (ref 6–23)
CO2: 26 mEq/L (ref 19–32)
Calcium: 8.8 mg/dL (ref 8.4–10.5)
Chloride: 96 mEq/L (ref 96–112)
Creatinine, Ser: 0.62 mg/dL (ref 0.40–1.20)
GFR: 102.54 mL/min (ref >60.00)
Glucose, Bld: 334 mg/dL — ABNORMAL HIGH (ref 70–99)
Potassium: 3.5 mEq/L (ref 3.5–5.1)
Sodium: 131 mEq/L — ABNORMAL LOW (ref 135–145)
Total Bilirubin: 0.6 mg/dL (ref 0.2–1.2)
Total Protein: 7.3 g/dL (ref 6.0–8.3)

## 2021-12-07 LAB — TRIGLYCERIDES: Triglycerides: 1667 mg/dL — ABNORMAL HIGH (ref 0.0–149.0)

## 2021-12-07 LAB — LIPASE: Lipase: 47 U/L (ref 11.0–59.0)

## 2021-12-07 MED ORDER — FAMOTIDINE 20 MG PO TABS: 20.0000 mg | ORAL_TABLET | Freq: Every day | ORAL | 2 refills | Status: DC

## 2021-12-07 NOTE — Patient Instructions (Addendum)
Your provider has requested that you go to the basement level for lab work before leaving today. Press "B" on the elevator. The lab is located at the first door on the left as you exit the elevator.  Due to recent changes in healthcare laws, you may see the results of your imaging and laboratory studies on MyChart before your provider has had a chance to review them.  We understand that in some cases there may be results that are confusing or concerning to you. Not all laboratory results come back in the same time frame and the provider may be waiting for multiple results in order to interpret others.  Please give [place] 48 hours in order for your provider to thoroughly review all the results before contacting the office for clarification of your results.   It has been recommended to you by your physician that you have a(n) EGD completed. Per your request, we did not schedule the procedure(s) today. Please contact our office at [phone] should you decide to have the procedure completed. You will be scheduled for a pre-visit and procedure at that time.   We have sent the following medications to your pharmacy for you to pick up at your convenience: Famotidine  You will be contacted by [place] in the next 2 days to arrange a MRI/MRCP.  The number on your caller ID will be [phone], please answer when they call.  If you have not heard from them in 2 days please call [phone] to schedule.     Go to ED if severe abdominal pain occurs.  I appreciate the opportunity to care for you. Carl Best

## 2021-12-08 NOTE — Progress Notes (Signed)
____________________________________________________________  Attending physician addendum:  Thank you for sending this case to me. I have reviewed the entire note and agree with the plan.  Agree with your testing as outlined.  With hemoglobin A1c of 9.2 and this patient's clinical history, I suspect her poorly controlled diabetes is likely because of upper digestive symptoms.  Whether or not that is frank gastroparesis remains to be seen. I will also forward this note to the patient's PCP and inquire if they have considered having this patient evaluation in lipid clinic due to her marked hypertriglyceridemia and history of pancreatitis.  Wilfrid Lund, MD  ____________________________________________________________

## 2021-12-13 LAB — IGG 4: IgG, Subclass 4: 10 mg/dL (ref 2–96)

## 2021-12-14 NOTE — Progress Notes (Signed)
Thanks for letting me know!   Brooke, if we could have her come in for a fasting appt at her convenience - we have some openings today - that would be great  Thanks,  Denice Paradise

## 2021-12-22 ENCOUNTER — Ambulatory Visit (HOSPITAL_COMMUNITY)
Admission: RE | Admit: 2021-12-22 | Discharge: 2021-12-22 | Disposition: A | Discharge status: Home / Self Care | Payer: BC Managed Care – PPO | Source: Ambulatory Visit | Attending: Nurse Practitioner | Admitting: Nurse Practitioner

## 2021-12-22 ENCOUNTER — Other Ambulatory Visit: Payer: Self-pay | Admitting: Nurse Practitioner

## 2021-12-22 DIAGNOSIS — R1012 Left upper quadrant pain: Secondary | ICD-10-CM

## 2021-12-22 DIAGNOSIS — K863 Pseudocyst of pancreas: Secondary | ICD-10-CM | POA: Diagnosis not present

## 2021-12-22 DIAGNOSIS — R112 Nausea with vomiting, unspecified: Secondary | ICD-10-CM | POA: Diagnosis not present

## 2021-12-22 DIAGNOSIS — Z8719 Personal history of other diseases of the digestive system: Secondary | ICD-10-CM | POA: Insufficient documentation

## 2021-12-22 DIAGNOSIS — R935 Abnormal findings on diagnostic imaging of other abdominal regions, including retroperitoneum: Secondary | ICD-10-CM | POA: Diagnosis not present

## 2021-12-22 DIAGNOSIS — K862 Cyst of pancreas: Secondary | ICD-10-CM | POA: Diagnosis not present

## 2021-12-22 DIAGNOSIS — K760 Fatty (change of) liver, not elsewhere classified: Secondary | ICD-10-CM | POA: Diagnosis not present

## 2021-12-22 DIAGNOSIS — N281 Cyst of kidney, acquired: Secondary | ICD-10-CM | POA: Diagnosis not present

## 2021-12-22 MED ORDER — GADOBUTROL 1 MMOL/ML IV SOLN
8.5000 mL | Freq: Once | INTRAVENOUS | Status: AC | PRN
  Administered 2021-12-22: 8.5 mL via INTRAVENOUS

## 2021-12-27 ENCOUNTER — Other Ambulatory Visit: Payer: Self-pay

## 2021-12-27 DIAGNOSIS — M799 Soft tissue disorder, unspecified: Secondary | ICD-10-CM

## 2021-12-28 DIAGNOSIS — Z124 Encounter for screening for malignant neoplasm of cervix: Secondary | ICD-10-CM | POA: Diagnosis not present

## 2021-12-28 DIAGNOSIS — Z01419 Encounter for gynecological examination (general) (routine) without abnormal findings: Secondary | ICD-10-CM | POA: Diagnosis not present

## 2021-12-28 DIAGNOSIS — Z6832 Body mass index (BMI) 32.0-32.9, adult: Secondary | ICD-10-CM | POA: Diagnosis not present

## 2021-12-28 DIAGNOSIS — R1900 Intra-abdominal and pelvic swelling, mass and lump, unspecified site: Secondary | ICD-10-CM | POA: Diagnosis not present

## 2021-12-29 ENCOUNTER — Telehealth: Payer: Self-pay

## 2021-12-29 NOTE — Telephone Encounter (Signed)
Spoke with Teresa Parks regarding her referral to GYN oncology. She has an appointment scheduled with Dr. Berline Lopes on 01/15/22 at [facility]. Patient agrees to date and time. She has been provided with office address and location. She is also aware of our mask and visitor policy. Patient verbalized understanding and will call with any questions.

## 2022-01-05 ENCOUNTER — Ambulatory Visit (HOSPITAL_COMMUNITY): Admission: RE | Admit: 2022-01-05 | Payer: BC Managed Care – PPO | Source: Ambulatory Visit

## 2022-01-10 ENCOUNTER — Encounter: Payer: Self-pay | Admitting: Gynecologic Oncology

## 2022-01-11 ENCOUNTER — Telehealth: Payer: Self-pay | Admitting: Nurse Practitioner

## 2022-01-11 DIAGNOSIS — R112 Nausea with vomiting, unspecified: Secondary | ICD-10-CM

## 2022-01-11 DIAGNOSIS — R1012 Left upper quadrant pain: Secondary | ICD-10-CM

## 2022-01-11 NOTE — Telephone Encounter (Signed)
Instructions for EGD done. Patient informed and will let us known if she can't see the instructions.

## 2022-01-11 NOTE — Telephone Encounter (Signed)
Patient called today to schedule EGD with Dr. Loletha Carrow.  When she was here with her visit with [facility], she was unable to schedule it at that time.  It is scheduled for 7/6 at 8 a.m.  Please send her instructions to her My Chart.  Thank you.

## 2022-01-14 NOTE — Progress Notes (Unsigned)
GYNECOLOGIC ONCOLOGY NEW PATIENT CONSULTATION   Patient Name: Teresa Parks  Patient Age: 51 y.o. Date of Service: 01/15/22 Referring Provider: Dr. Brien Few  Primary Care Provider: Lafonda Mosses, MD Consulting Provider: Jeral Pinch, MD   Assessment/Plan:  Perimenopausal patient with complex, mostly solid appearing mass.  Discussed with patient findings on recent MRI performed in the setting of her abdominal pain and otitis history.  Although I cannot see the ultrasound from middle of June, we discussed that there is an approximately 10 cm solid-appearing mass, possibly representing a dermoid.  Given the size and appearance of the mass, I recommend that we proceed with surgical evaluation and excision.  We also discussed the patient's tumor markers.  She had an elevated CEA, which can be seen in the setting of certain benign and malignant ovarian tumors.  This can also represent a tumor marker for colorectal cancer and other GI processes.  Patient is scheduled for endoscopy later this week.  There had initially been discussion about colonoscopy at the time of her endoscopy.  I have asked the patient to reach out to her gastroenterologist and I will have my office reach out to.  Ideally, the patient would also undergo colonoscopy evaluation prior to surgery.  The patient has a history of diabetes.  Given her report of recent blood sugars, my suspicion is that her hemoglobin A1c is significantly elevated.  We discussed increased surgical morbidity in the perioperative, both from a cardiac standpoint but also a healing standpoint.  We will plan to get an hemoglobin A1c today.  If significantly elevated, the patient will need to reestablish with an endocrinologist.  May have to delay surgery some until better glycemic control.  If this is the case, we will plan for an MRI of the pelvis to assure no imaging findings that significant concern for the possibility of malignancy.  In the event  that her hemoglobin A1c is within the 8-9% range and we are able to move forward with surgery sooner, we will plan to get a pelvic ultrasound to assess adnexal mass.  We will reach out to primary care provider for preoperative clearance in the setting of her hypertension and diabetes.  We discussed plan for surgery which would involve robotic unilateral oophorectomy and bilateral salpingectomy at minimum.  Plan would be to send the mass for frozen section.  If benign, the other ovary as well as uterus and cervix can remain in place.  If borderline tumor, we discussed recommendation for bilateral salpingo-oophorectomy, possible total hysterectomy, peritoneal biopsies, and omentectomy.  In the setting of malignancy, lymph node sampling would be indicated.  Patient will return for a preoperative visit closer to the date of surgery.  A copy of this note was sent to the patient's referring provider.   65 minutes of total time was spent for this patient encounter, including preparation, face-to-face counseling with the patient and coordination of care, and documentation of the encounter.   Jeral Pinch, MD  Division of Gynecologic Oncology  Department of Obstetrics and Gynecology  [facility]  ___________________________________________  Chief Complaint: No chief complaint on file.   History of Present Illness:  Teresa Parks is a 51 y.o. y.o. female who is seen in consultation at the request of Dr. Ronita Hipps for an evaluation of a complex pelvic mass.  Patient has a history of chronic pancreatitis since 2015.  After a recent flare which started in April, she underwent MRI of the abdomen with MRCP  in early June.  This showed no evidence of pancreatitis.  There was no change noted in a benign-appearing cystic lesion at the head of the pancreas, stable over 3 years.  Indeterminate large, ovoid lesion was seen within the upper pelvis, CT of the abdomen and pelvis  recommended for further characterization.  Tumor markers were performed on 12/28/2021, notable for normal CA-125 of 22.4 and CEA elevated at 9.   Pelvic ultrasound exam at [place] on 12/28/2021 shows a uterus measuring 8 x 5.4 x 4.8 cm with an endometrial lining of 14.9 mm.  Hypoechoic focal area measuring up to 6 mm is visualized within the endometrium with stalk that appears to have a blood flow.  Left ovary measures up to 3.1 cm with a simple cyst measuring 3.1 x 3.1 x 2.6 cm.  There is a large solid mass measuring 10.3 x 9.4 x 7.1 cm with no obvious increased internal blood flow that appears within the right adnexa, possible dermoid.  Patient reports overall doing well.  She has recovered from her pancreatitis flare in April.  When she gets these now, she quickly recognizes the symptoms and stops eating, stays hydrated and drinks protein shakes.  She is currently having some intermittent pain at by her left ribs.  Otherwise denies any abdominal or pelvic pain.  She is scheduled for an EGD later this week.  She endorses a good appetite without nausea or emesis.  Denies any early satiety currently, has early satiety with pancreatitis flares.  She denies any abdominal bloating.  She endorses regular bowel and bladder function.  She reports that menses have become somewhat irregular.  She will sometimes skip periods.  She denies any intermenstrual bleeding.  She is having this brown discharge today, which she typically does before she starts her menses.  Her history is notable for hypertension, hyperlipidemia, recurrent pancreatitis, diabetes mellitus type 2, Bell's Palsy and hepatic steatosis. Last A1c in 08/2021 was 9.2%.  Was seeing Dr. Loanne Drilling, but today tells me her endocrinologist has retired.  Her blood glucose has been running 200-2 50 since April.  She has a history of tobacco use, is working on quitting.  She is down to half a pack a day.  Surgical history is notable for an umbilical  hernia repair about 20 years ago.  Is unsure whether mesh was used.  PAST MEDICAL HISTORY:  Past Medical History:  Diagnosis Date   Chronic ear infection    Chronic pancreatitis ([place])    Diabetes ([place])    Diabetes mellitus type II, controlled ([place])    Hemorrhoids    HTN (hypertension)    Hyperlipidemia    Obesity    Pancreatitis, acute      PAST SURGICAL HISTORY:  Past Surgical History:  Procedure Laterality Date   KNEE ARTHROSCOPY Right    UMBILICAL HERNIA REPAIR     WISDOM TOOTH EXTRACTION      OB/GYN HISTORY:  OB History  Gravida Para Term Preterm AB Living  1 1          SAB IAB Ectopic Multiple Live Births               # Outcome Date GA Lbr Len/2nd Weight Sex Delivery Anes PTL Lv  1 Para             No LMP recorded.  Age at menarche: 11 Age at menopause: Not applicable Hx of HRT: Not applicable Hx of STDs: Denies Last pap: 01/2019 - normal History of  abnormal pap smears: no  SCREENING STUDIES:  Last mammogram: 02/2021  Last colonoscopy: n/a Last bone mineral density: n/a  MEDICATIONS: Outpatient Encounter Medications as of 01/15/2022  Medication Sig   amLODipine-olmesartan (AZOR) 10-40 MG tablet Take 1 tablet by mouth daily.   cyclobenzaprine (FLEXERIL) 10 MG tablet Take 1 tablet (10 mg total) by mouth at bedtime.   famotidine (PEPCID) 20 MG tablet Take 1 tablet (20 mg total) by mouth daily.   fenofibrate 160 MG tablet Take 1 tablet (160 mg total) by mouth daily.   glucose blood test strip Use as instructed   Icosapent Ethyl (VASCEPA) 0.5 g CAPS Take 4 capsules by mouth daily.   insulin degludec (TRESIBA FLEXTOUCH) 200 UNIT/ML FlexTouch Pen Inject 56 Units into the skin daily. And pen needles 1/day   Insulin Pen Needle 31G X 8 MM MISC 1 each by Does not apply route daily. Use to inject insulin daily; E11.65   Lancets (FREESTYLE) lancets Use as instructed   [DISCONTINUED] amoxicillin-clavulanate (AUGMENTIN) 875-125 MG tablet Take 1 tablet by mouth 2 (two)  times daily.   No facility-administered encounter medications on file as of 01/15/2022.    ALLERGIES:  Allergies  Allergen Reactions   Novocain [Procaine] Nausea And Vomiting     FAMILY HISTORY:  Family History  Problem Relation Age of Onset   Diabetes Mother    Hypertension Mother        Living   Heart attack Father 25       Deceased   Diabetes Father    Healthy Brother    Diabetes Paternal Uncle    Alzheimer's disease Maternal Grandfather    Healthy Daughter    Stomach cancer Neg Hx    Colon cancer Neg Hx    Esophageal cancer Neg Hx    Breast cancer Neg Hx    Ovarian cancer Neg Hx    Endometrial cancer Neg Hx    Pancreatic cancer Neg Hx    Prostate cancer Neg Hx      SOCIAL HISTORY:  Social Connections: Not on file    REVIEW OF SYSTEMS:  + Left upper quadrant abdominal pain Denies appetite changes, fevers, chills, fatigue, unexplained weight changes. Denies hearing loss, neck lumps or masses, mouth sores, ringing in ears or voice changes. Denies cough or wheezing.  Denies shortness of breath. Denies chest pain or palpitations. Denies leg swelling. Denies abdominal distention, blood in stools, constipation, diarrhea, nausea, vomiting, or early satiety. Denies pain with intercourse, dysuria, frequency, hematuria or incontinence. Denies hot flashes, pelvic pain, vaginal bleeding or vaginal discharge.   Denies joint pain, back pain or muscle pain/cramps. Denies itching, rash, or wounds. Denies dizziness, headaches, numbness or seizures. Denies swollen lymph nodes or glands, denies easy bruising or bleeding. Denies anxiety, depression, confusion, or decreased concentration.  Physical Exam:  Vital Signs for this encounter:  Blood pressure (!) 174/92, pulse 100, temperature 97.9 F (36.6 C), temperature source Tympanic, resp. rate 18, height '5\' 4"'$  (1.626 m), weight 193 lb 12.8 oz (87.9 kg), SpO2 100 %. Body mass index is 33.27 kg/m. General: Alert, oriented, no  acute distress.  HEENT: Normocephalic, atraumatic. Sclera anicteric.  Chest: Clear to auscultation bilaterally. No wheezes, rhonchi, or rales. Cardiovascular: Regular rate and rhythm, no murmurs, rubs, or gallops.  Abdomen: Obese. Normoactive bowel sounds. Soft, nondistended, nontender to palpation. No masses or hepatosplenomegaly appreciated. No palpable fluid wave.  Extremities: Grossly normal range of motion. Warm, well perfused. No edema bilaterally.  Skin: No rashes or lesions.  Lymphatics: No cervical, supraclavicular, or inguinal adenopathy.  GU:  Normal external female genitalia.  No lesions. No discharge or bleeding.             Bladder/urethra:  No lesions or masses, well supported bladder             Vagina: Well rugated.  No lesions or masses.  Some older appearing blood within the vault.             Cervix: Normal appearing, no lesions.             Uterus: Small, mobile, no parametrial involvement or nodularity.  Uterus moves in conjunction with the mass that feels midline on abdominal palpation, palpated approximately 4-6 cm inferior to the umbilicus.             Adnexa: See above.  Rectal: Deferred.  LABORATORY AND RADIOLOGIC DATA:  Outside medical records were reviewed to synthesize the above history, along with the history and physical obtained during the visit.   Lab Results  Component Value Date   WBC 7.8 12/07/2021   HGB 14.3 12/07/2021   HCT 42.3 12/07/2021   PLT 324.0 12/07/2021   GLUCOSE 334 (H) 12/07/2021   CHOL 170 08/17/2021   TRIG (H) 12/07/2021    1667.0 Triglyceride is over 400; calculations on Lipids are invalid.   HDL 26.60 (L) 08/17/2021   LDLDIRECT 30.0 08/17/2021   LDLCALC NOT CALC 09/19/2015   ALT 15 12/07/2021   AST 14 12/07/2021   NA 131 (L) 12/07/2021   K 3.5 12/07/2021   CL 96 12/07/2021   CREATININE 0.62 12/07/2021   BUN 8 12/07/2021   CO2 26 12/07/2021   TSH 1.97 08/17/2021   HGBA1C 12.7 (H) 01/15/2022   MICROALBUR 4.92 (H) 03/04/2014

## 2022-01-15 ENCOUNTER — Telehealth: Payer: Self-pay | Admitting: Gynecologic Oncology

## 2022-01-15 ENCOUNTER — Inpatient Hospital Stay: Payer: BC Managed Care – PPO

## 2022-01-15 ENCOUNTER — Other Ambulatory Visit: Payer: Self-pay

## 2022-01-15 ENCOUNTER — Telehealth: Payer: Self-pay

## 2022-01-15 ENCOUNTER — Inpatient Hospital Stay: Payer: BC Managed Care – PPO | Attending: Gynecologic Oncology | Admitting: Gynecologic Oncology

## 2022-01-15 ENCOUNTER — Encounter: Payer: Self-pay | Admitting: Gynecologic Oncology

## 2022-01-15 ENCOUNTER — Telehealth: Payer: Self-pay | Admitting: Gastroenterology

## 2022-01-15 ENCOUNTER — Other Ambulatory Visit: Payer: Self-pay | Admitting: Gynecologic Oncology

## 2022-01-15 VITALS — BP 174/92 | HR 100 | Temp 97.9°F | Resp 18 | Ht 64.0 in | Wt 193.8 lb

## 2022-01-15 DIAGNOSIS — Z794 Long term (current) use of insulin: Secondary | ICD-10-CM | POA: Diagnosis not present

## 2022-01-15 DIAGNOSIS — E669 Obesity, unspecified: Secondary | ICD-10-CM | POA: Insufficient documentation

## 2022-01-15 DIAGNOSIS — I10 Essential (primary) hypertension: Secondary | ICD-10-CM | POA: Insufficient documentation

## 2022-01-15 DIAGNOSIS — R970 Elevated carcinoembryonic antigen [CEA]: Secondary | ICD-10-CM

## 2022-01-15 DIAGNOSIS — Z79899 Other long term (current) drug therapy: Secondary | ICD-10-CM | POA: Insufficient documentation

## 2022-01-15 DIAGNOSIS — R1900 Intra-abdominal and pelvic swelling, mass and lump, unspecified site: Secondary | ICD-10-CM

## 2022-01-15 DIAGNOSIS — E119 Type 2 diabetes mellitus without complications: Secondary | ICD-10-CM | POA: Diagnosis not present

## 2022-01-15 DIAGNOSIS — R1012 Left upper quadrant pain: Secondary | ICD-10-CM

## 2022-01-15 DIAGNOSIS — K861 Other chronic pancreatitis: Secondary | ICD-10-CM | POA: Insufficient documentation

## 2022-01-15 DIAGNOSIS — E785 Hyperlipidemia, unspecified: Secondary | ICD-10-CM | POA: Insufficient documentation

## 2022-01-15 DIAGNOSIS — R112 Nausea with vomiting, unspecified: Secondary | ICD-10-CM

## 2022-01-15 DIAGNOSIS — F17210 Nicotine dependence, cigarettes, uncomplicated: Secondary | ICD-10-CM | POA: Diagnosis not present

## 2022-01-15 DIAGNOSIS — G510 Bell's palsy: Secondary | ICD-10-CM | POA: Insufficient documentation

## 2022-01-15 DIAGNOSIS — Z6833 Body mass index (BMI) 33.0-33.9, adult: Secondary | ICD-10-CM | POA: Diagnosis not present

## 2022-01-15 DIAGNOSIS — K760 Fatty (change of) liver, not elsewhere classified: Secondary | ICD-10-CM | POA: Insufficient documentation

## 2022-01-15 DIAGNOSIS — Z1211 Encounter for screening for malignant neoplasm of colon: Secondary | ICD-10-CM

## 2022-01-15 DIAGNOSIS — E1165 Type 2 diabetes mellitus with hyperglycemia: Secondary | ICD-10-CM | POA: Diagnosis not present

## 2022-01-15 LAB — HEMOGLOBIN A1C
Hgb A1c MFr Bld: 12.7 % — ABNORMAL HIGH (ref 4.8–5.6)
Mean Plasma Glucose: 317.79 mg/dL

## 2022-01-15 NOTE — Telephone Encounter (Signed)
Called the patient to discuss hemoglobin A1c.  Patient aware that it is quite elevated.  She had asked my office to send a referral to [facility] endocrinology.  She is going to call on Wednesday after the holiday to see if she can get an appointment since she has already been a patient there.  Jeral Pinch MD Gynecologic Oncology

## 2022-01-15 NOTE — Patient Instructions (Signed)
We will plan to check a hemoglobin A1C today. If elevated (greater than 8), surgery may need to be delayed to allow time for improvement of your blood sugar levels.  We will tentatively plan for surgery at [facility] with Dr. Jeral Pinch in the next several weeks and this may be subject to change based on the A1C results. We will see you back in the office closer to the date for a preop appointment with Joylene John NP to discuss the instrcutions for before and after surgery.  We have also reached out to Gastroenterology to add on a colonoscopy to your procedure later this week.   Once a surgery date has been selected, you may also receive a phone call from the hospital to arrange for a pre-op appointment there as well. Usually both appointments can be combined on the same day.    Plan to have an ultrasound and Dr. Berline Lopes will notify you of the results.

## 2022-01-15 NOTE — Telephone Encounter (Signed)
Urgent referral in [facility] for Needs screening colonoscopy. Has pelvic mass, plan for surgery. CEA elevated at 9.  She is scheduled for an endo on 01/18/2022.  Please advise scheduling direct colon?

## 2022-01-15 NOTE — Telephone Encounter (Deleted)
Teresa Parks, this patient will need an office visit. Dr. Loletha Carrow is booked until late July. Pt last saw Jaclyn Shaggy who also does not have availability until late July. Alonza Bogus, PA-C had a cancellation on Thursday, 01/25/22 at 2:30 pm, I tentatively scheduled pt in that appt slot. Please offer pt the mentioned appt with Janett Billow. Thank you!

## 2022-01-15 NOTE — Telephone Encounter (Signed)
Dr. Loletha Carrow please see notes Below:  7:30 AM Colonoscopy appointment removed.  Pt notified to be on Standby for the Colonoscopy and we will notify pt no later than 01/17/2022  to clarify if  Dr. Loletha Carrow wants to proceed with Colonoscopy: Dr. Loletha Carrow: If you are in agreement for a 7:30 AM  appointment for the EGD/ Colonoscopy please advise and I will add Colonoscopy and notify Pt. Thanks

## 2022-01-15 NOTE — Telephone Encounter (Signed)
Spoke with Ms. Dragoo and reviewed Hgb A1c (12.7). Per Joylene John, NP patient needs to really watch what she eats and monitor blood sugar and keep a log. We have placed a referral to endocrinology([facility]) . We would ideally want A1c in the low 8's or less. Since we will be delaying surgery Dr. Berline Lopes would like to change her [place] to an MRI. Patient verbalized understanding of above information. She states " I used to go to [facility], I will also call and them and follow up on the referral."  Advised that our office will work on scheduling the MRI and we will be in touch with the appointment details. Patient verbalized understanding. Instructed to call with any needs.

## 2022-01-15 NOTE — Telephone Encounter (Signed)
Teresa Parks, it looks like [name], Therapist, sports is working on this. Please disregard last message.

## 2022-01-15 NOTE — Telephone Encounter (Signed)
[facility] contacted to see if we could accommodate Teresa Parks along with EGD on Dr. Loletha Carrow Schedule on 01/18/2022: Jinny Blossom stated that they could start the procedure a little earlier and book pt for a 7:30 appointment:  Pt contacted to see if she was in agreement for the Colonoscopy: Pt stated that she was in agreement: Pt scheduled for and EGD/ Teresa Parks on 01/18/2022 at 7:30 AM with Dr. Loletha Carrow: Prep instructions sent to pt via My Chart:  Pt made aware  Ambulatory referral to GI sent in Epic: Pt verbalized understanding with all questions answered.

## 2022-01-15 NOTE — Telephone Encounter (Signed)
-----   Message from Noralyn Pick, NP sent at 01/15/2022 11:26 AM EDT ----- Teresa Parks,  I am not sure if Dr. Loletha Carrow' schedule has time to add the colonoscopy to her 01/18/2022 EGD date. Note, at the time of her office appointment 12/07/2021 I advised a colonoscopy but she declined.  [name], please check and see if Dr. Loletha Carrow' endo schedule has time to add the colonoscopy on 01/18/2022. Add colonoscopy to EGD orders if patient is willing. Let me know outcome.  Thanks   ----- Message ----- From: Dorothyann Gibbs, NP Sent: 01/15/2022  10:16 AM EDT To: Nelida Meuse III, MD; #  Good morning,      We are seeing the above patient this am for a pelvic mass with elevated CEA. She is scheduled for an EGD on 01/18/22 and we wanted to see if it is possible to add on a colonoscopy as well. Thank you so much for your assistance with this.   Sincerely Joylene John NP GYN Oncology [phone]

## 2022-01-16 DIAGNOSIS — Z1211 Encounter for screening for malignant neoplasm of colon: Secondary | ICD-10-CM | POA: Insufficient documentation

## 2022-01-16 DIAGNOSIS — R1900 Intra-abdominal and pelvic swelling, mass and lump, unspecified site: Secondary | ICD-10-CM | POA: Insufficient documentation

## 2022-01-16 DIAGNOSIS — R970 Elevated carcinoembryonic antigen [CEA]: Secondary | ICD-10-CM | POA: Insufficient documentation

## 2022-01-17 ENCOUNTER — Telehealth: Payer: Self-pay

## 2022-01-17 ENCOUNTER — Other Ambulatory Visit: Payer: Self-pay

## 2022-01-17 DIAGNOSIS — R970 Elevated carcinoembryonic antigen [CEA]: Secondary | ICD-10-CM

## 2022-01-17 DIAGNOSIS — R112 Nausea with vomiting, unspecified: Secondary | ICD-10-CM

## 2022-01-17 DIAGNOSIS — R1900 Intra-abdominal and pelvic swelling, mass and lump, unspecified site: Secondary | ICD-10-CM

## 2022-01-17 DIAGNOSIS — R1012 Left upper quadrant pain: Secondary | ICD-10-CM

## 2022-01-17 DIAGNOSIS — Z1211 Encounter for screening for malignant neoplasm of colon: Secondary | ICD-10-CM

## 2022-01-17 NOTE — Telephone Encounter (Signed)
Pt aware of scheduled MRI 01/29/22 '@12'$ :30pm no prep needed. Pt voiced an understanding.

## 2022-01-17 NOTE — Telephone Encounter (Signed)
Pt made aware that Dr. Dr. Loletha Carrow can accommodate 7:30 tomorrow morning to start her procedures: Ambulatory GI referral placed for Colonoscopy Pt verbalized understanding with all questions answered.

## 2022-01-17 NOTE — Telephone Encounter (Signed)
Yes, I can certainly accommodate a 730 AM start that day for the procedures.  Please contact the patient first thing this morning.  - HD

## 2022-01-18 ENCOUNTER — Ambulatory Visit (AMBULATORY_SURGERY_CENTER): Payer: BC Managed Care – PPO | Admitting: Gastroenterology

## 2022-01-18 ENCOUNTER — Encounter: Payer: BC Managed Care – PPO | Admitting: Gastroenterology

## 2022-01-18 ENCOUNTER — Encounter: Payer: Self-pay | Admitting: Gastroenterology

## 2022-01-18 VITALS — BP 124/82 | HR 82 | Temp 98.6°F | Resp 19 | Ht 64.0 in | Wt 192.0 lb

## 2022-01-18 DIAGNOSIS — Z1211 Encounter for screening for malignant neoplasm of colon: Secondary | ICD-10-CM

## 2022-01-18 DIAGNOSIS — R1010 Upper abdominal pain, unspecified: Secondary | ICD-10-CM

## 2022-01-18 DIAGNOSIS — D125 Benign neoplasm of sigmoid colon: Secondary | ICD-10-CM | POA: Diagnosis not present

## 2022-01-18 DIAGNOSIS — R112 Nausea with vomiting, unspecified: Secondary | ICD-10-CM | POA: Diagnosis not present

## 2022-01-18 DIAGNOSIS — K2970 Gastritis, unspecified, without bleeding: Secondary | ICD-10-CM | POA: Diagnosis not present

## 2022-01-18 DIAGNOSIS — R6881 Early satiety: Secondary | ICD-10-CM

## 2022-01-18 DIAGNOSIS — K2990 Gastroduodenitis, unspecified, without bleeding: Secondary | ICD-10-CM

## 2022-01-18 DIAGNOSIS — K319 Disease of stomach and duodenum, unspecified: Secondary | ICD-10-CM | POA: Diagnosis not present

## 2022-01-18 DIAGNOSIS — K2980 Duodenitis without bleeding: Secondary | ICD-10-CM | POA: Diagnosis not present

## 2022-01-18 LAB — HM COLONOSCOPY

## 2022-01-18 MED ORDER — SODIUM CHLORIDE 0.9 % IV SOLN: 500.0000 mL | Freq: Once | INTRAVENOUS | Status: DC

## 2022-01-18 NOTE — Progress Notes (Signed)
Called to room to assist during endoscopic procedure.  Patient ID and intended procedure confirmed with present staff. Received instructions for my participation in the procedure from the performing physician.  

## 2022-01-18 NOTE — Progress Notes (Signed)
Pt in recovery with monitors in place, VSS. Report given to receiving RN. Bite guard was placed with pt awake to ensure comfort. No dental or soft tissue damage noted. 

## 2022-01-18 NOTE — Op Note (Signed)
[facility] Patient Name: Teresa Parks Procedure Date: 01/18/2022 7:34 AM MRN: [id number] Endoscopist: Mallie Mussel L. Loletha Carrow , MD Age: 51 Referring MD:  Date of Birth: 11-16-1970 Gender: Female Account #: [id number] Procedure:                Colonoscopy Indications:              Screening for colorectal malignant neoplasm, , This                            is the patient's first colonoscopy                           (gynecology also found elevated CEA recently in                            setting of pelvic mass on MRI and requested this                            procedure to evaluate colon prior to pelvic surgery) Medicines:                Monitored Anesthesia Care Procedure:                Pre-Anesthesia Assessment:                           - Prior to the procedure, a History and Physical                            was performed, and patient medications and                            allergies were reviewed. The patient's tolerance of                            previous anesthesia was also reviewed. The risks                            and benefits of the procedure and the sedation                            options and risks were discussed with the patient.                            All questions were answered, and informed consent                            was obtained. Prior Anticoagulants: The patient has                            taken no previous anticoagulant or antiplatelet                            agents. ASA Grade Assessment: II - A patient with  mild systemic disease. After reviewing the risks                            and benefits, the patient was deemed in                            satisfactory condition to undergo the procedure.                           After obtaining informed consent, the colonoscope                            was passed under direct vision. Throughout the                            procedure, the patient's blood  pressure, pulse, and                            oxygen saturations were monitored continuously. The                            Olympus CF-HQ190L [id number]) Colonoscope was                            introduced through the anus and advanced to the the                            cecum, identified by appendiceal orifice and                            ileocecal valve. The colonoscopy was somewhat                            difficult due to a redundant colon. Successful                            completion of the procedure was aided by                            straightening and shortening the scope to obtain                            bowel loop reduction and lavage. The patient                            tolerated the procedure well. The quality of the                            bowel preparation was good after lavage. The                            ileocecal valve, appendiceal orifice, and rectum  were photographed. Scope In: 7:44:17 AM Scope Out: 8:05:27 AM Scope Withdrawal Time: 0 hours 16 minutes 12 seconds  Total Procedure Duration: 0 hours 21 minutes 10 seconds  Findings:                 The perianal and digital rectal examinations were                            normal.                           Two sessile polyps were found in the distal sigmoid                            colon. The polyps were diminutive in size. These                            polyps were removed with a cold snare. Resection                            and retrieval were complete.                           There was extrinsic compression along the anterior                            wall of the distal rectum (see photo).                           Repeat examination of right colon under NBI                            performed.                           The exam was otherwise without abnormality on                            direct and retroflexion views. Complications:            No  immediate complications. Estimated Blood Loss:     Estimated blood loss was minimal. Impression:               - Two diminutive polyps in the distal sigmoid                            colon, removed with a cold snare. Resected and                            retrieved.                           - The examination was otherwise normal on direct                            and retroflexion views. Recommendation:           -  Patient has a contact number available for                            emergencies. The signs and symptoms of potential                            delayed complications were discussed with the                            patient. Return to normal activities tomorrow.                            Written discharge instructions were provided to the                            patient.                           - Resume previous diet.                           - Continue present medications.                           - Await pathology results.                           - Repeat colonoscopy is recommended for                            surveillance. The colonoscopy date will be                            determined after pathology results from today's                            exam become available for review.                           - See the other procedure note for documentation of                            additional recommendations. Mithcell Schumpert L. Loletha Carrow, MD 01/18/2022 8:25:05 AM This report has been signed electronically.

## 2022-01-18 NOTE — Op Note (Signed)
[place] Patient Name: Teresa Parks Procedure Date: 01/18/2022 7:32 AM MRN: [id number] Endoscopist: Craig Beach. Loletha Carrow , MD Age: 51 Referring MD:  Date of Birth: 1970-12-06 Gender: Female Account #: [id number] Procedure:                Upper GI endoscopy Indications:              Upper abdominal pain, Nausea with vomiting                           (poorly controlled diabetes) Medicines:                Monitored Anesthesia Care Procedure:                Pre-Anesthesia Assessment:                           - Prior to the procedure, a History and Physical                            was performed, and patient medications and                            allergies were reviewed. The patient's tolerance of                            previous anesthesia was also reviewed. The risks                            and benefits of the procedure and the sedation                            options and risks were discussed with the patient.                            All questions were answered, and informed consent                            was obtained. Prior Anticoagulants: The patient has                            taken no previous anticoagulant or antiplatelet                            agents. ASA Grade Assessment: II - A patient with                            mild systemic disease. After reviewing the risks                            and benefits, the patient was deemed in                            satisfactory condition to undergo the procedure.  After obtaining informed consent, the endoscope was                            passed under direct vision. Throughout the                            procedure, the patient's blood pressure, pulse, and                            oxygen saturations were monitored continuously. The                            GIF [id number] #[id number] was introduced through the                            mouth, and advanced to the second part of  duodenum.                            The upper GI endoscopy was accomplished without                            difficulty. The patient tolerated the procedure                            well. Scope In: Scope Out: Findings:                 Inlet patch in proximal esophagus. Esophagus                            otherwise nornmal.                           A few diminutive erosions with no bleeding and no                            stigmata of recent bleeding were found in the                            prepyloric region of the stomach. Several biopsies                            were obtained on the greater curvature of the                            gastric body, on the lesser curvature of the                            gastric body, on the greater curvature of the                            gastric antrum and on the lesser curvature of the  gastric antrum with cold forceps for histology.                           The exam of the stomach was otherwise normal.                           The cardia and gastric fundus were normal on                            retroflexion.                           Patchy mild mucosal changes characterized by                            congestion and erythema were found in the entire                            duodenum. Biopsies were taken with a cold forceps                            for histology. Complications:            No immediate complications. Estimated Blood Loss:     Estimated blood loss was minimal. Impression:               - Normal esophagus except incidental inlet patch.                           - Erosive gastropathy with no bleeding and no                            stigmata of recent bleeding.                           - Mucosal changes in the duodenum. Biopsied.                           - Several biopsies were obtained on the greater                            curvature of the gastric body, on the lesser                             curvature of the gastric body, on the greater                            curvature of the gastric antrum and on the lesser                            curvature of the gastric antrum. Recommendation:           - Patient has a contact number available for  emergencies. The signs and symptoms of potential                            delayed complications were discussed with the                            patient. Return to normal activities tomorrow.                            Written discharge instructions were provided to the                            patient.                           - Resume previous diet.                           - Continue present medications.                           - Await pathology results.                           - Do a gastric emptying study at appointment to be                            scheduled.                           - Attend GI clinic visit as scheduled 01/25/22.At                            that time , review biopsy results and discuss                            empiric trial of metoclopramide for suspected                            diabetic gastropathy. Carmesha Morocco L. Loletha Carrow, MD 01/18/2022 8:32:37 AM This report has been signed electronically.

## 2022-01-18 NOTE — Patient Instructions (Signed)
Handouts provided for polyps and gastritis.  Resume previous diet.  Continue present medications.  Await pathology results. Do a gastric emptying study at appointment to be scheduled.  GI clinic appointment 01/25/22.   YOU HAD AN ENDOSCOPIC PROCEDURE TODAY AT [facility]:   Refer to the procedure report that was given to you for any specific questions about what was found during the examination.  If the procedure report does not answer your questions, please call your gastroenterologist to clarify.  If you requested that your care partner not be given the details of your procedure findings, then the procedure report has been included in a sealed envelope for you to review at your convenience later.  YOU SHOULD EXPECT: Some feelings of bloating in the abdomen. Passage of more gas than usual.  Walking can help get rid of the air that was put into your GI tract during the procedure and reduce the bloating. If you had a lower endoscopy (such as a colonoscopy or flexible sigmoidoscopy) you may notice spotting of blood in your stool or on the toilet paper. If you underwent a bowel prep for your procedure, you may not have a normal bowel movement for a few days.  Please Note:  You might notice some irritation and congestion in your nose or some drainage.  This is from the oxygen used during your procedure.  There is no need for concern and it should clear up in a day or so.  SYMPTOMS TO REPORT IMMEDIATELY:  Following lower endoscopy (colonoscopy or flexible sigmoidoscopy):  Excessive amounts of blood in the stool  Significant tenderness or worsening of abdominal pains  Swelling of the abdomen that is new, acute  Fever of 100F or higher  Following upper endoscopy (EGD)  Vomiting of blood or coffee ground material  New chest pain or pain under the shoulder blades  Painful or persistently difficult swallowing  New shortness of breath  Fever of 100F or higher  Black, tarry-looking  stools  For urgent or emergent issues, a gastroenterologist can be reached at any hour by calling [phone]. Do not use MyChart messaging for urgent concerns.    DIET:  We do recommend a small meal at first, but then you may proceed to your regular diet.  Drink plenty of fluids but you should avoid alcoholic beverages for 24 hours.  ACTIVITY:  You should plan to take it easy for the rest of today and you should NOT DRIVE or use heavy machinery until tomorrow (because of the sedation medicines used during the test).    FOLLOW UP: Our staff will call the number listed on your records the next business day following your procedure.  We will call around 7:15- 8:00 am to check on you and address any questions or concerns that you may have regarding the information given to you following your procedure. If we do not reach you, we will leave a message.  If you develop any symptoms (ie: fever, flu-like symptoms, shortness of breath, cough etc.) before then, please call [phone].  If you test positive for Covid 19 in the 2 weeks post procedure, please call and report this information to [place].    If any biopsies were taken you will be contacted by phone or by letter within the next 1-3 weeks.  Please call us at [phone] if you have not heard about the biopsies in 3 weeks.    SIGNATURES/CONFIDENTIALITY: You and/or your care partner have signed paperwork which will be  entered into your electronic medical record.  These signatures attest to the fact that that the information above on your After Visit Summary has been reviewed and is understood.  Full responsibility of the confidentiality of this discharge information lies with you and/or your care-partner.

## 2022-01-18 NOTE — Progress Notes (Signed)
History and Physical:  This patient presents for endoscopic testing for: Encounter Diagnoses  Name Primary?   Nausea and vomiting in adult Yes   Special screening for malignant neoplasms, colon    Early satiety     51 year old woman last seen in our office 12/07/2021 for history of hypertriglyceridemia induced pancreatitis reporting chronic abdominal pain, nausea vomiting, left upper quadrant pain and early satiety.  She has poorly controlled diabetes with significantly elevated hemoglobin A1c.  (9.2 on February 2 and 12.7 on July 3) She was also advised to have a screening colonoscopy, but declined at that time.  Discussion with primary care over possible Cologuard.  We were recently contacted by her gynecologist because an MRI/MRCP discovered a pelvic mass that may require surgical management and she was noted to have an elevated CEA.  Thus a colonoscopy was requested by gynecology to rule out any colonic lesions.  No colonic mass was seen on that MRI. Patient is otherwise without complaints or active issues today.   Past Medical History: Past Medical History:  Diagnosis Date   Chronic ear infection    Chronic pancreatitis ([place])    Diabetes ([place])    Diabetes mellitus type II, controlled ([place])    Hemorrhoids    HTN (hypertension)    Hyperlipidemia    Obesity    Pancreatitis, acute      Past Surgical History: Past Surgical History:  Procedure Laterality Date   KNEE ARTHROSCOPY Right    UMBILICAL HERNIA REPAIR     WISDOM TOOTH EXTRACTION      Allergies: Allergies  Allergen Reactions   Novocain [Procaine] Nausea And Vomiting    Outpatient Meds: Current Outpatient Medications  Medication Sig Dispense Refill   amLODipine-olmesartan (AZOR) 10-40 MG tablet Take 1 tablet by mouth daily. 90 tablet 3   fenofibrate 160 MG tablet Take 1 tablet (160 mg total) by mouth daily. 90 tablet 1   glucose blood test strip Use as instructed 100 each 12   Icosapent Ethyl (VASCEPA) 0.5 g  CAPS Take 4 capsules by mouth daily. 360 capsule 3   insulin degludec (TRESIBA FLEXTOUCH) 200 UNIT/ML FlexTouch Pen Inject 56 Units into the skin daily. And pen needles 1/day 30 mL 3   Insulin Pen Needle 31G X 8 MM MISC 1 each by Does not apply route daily. Use to inject insulin daily; E11.65 90 each 0   Lancets (FREESTYLE) lancets Use as instructed 100 each 12   cyclobenzaprine (FLEXERIL) 10 MG tablet Take 1 tablet (10 mg total) by mouth at bedtime. 30 tablet 0   famotidine (PEPCID) 20 MG tablet Take 1 tablet (20 mg total) by mouth daily. 30 tablet 2   Current Facility-Administered Medications  Medication Dose Route Frequency Provider Last Rate Last Admin   0.9 %  sodium chloride infusion  500 mL Intravenous Once Danis, Estill Cotta III, MD          ___________________________________________________________________ Objective   Exam:  BP (!) 175/95   Pulse 95   Temp 98.6 F (37 C)   Resp 15   Ht '5\' 4"'$  (1.626 m)   Wt 192 lb (87.1 kg)   SpO2 94%   BMI 32.96 kg/m   CV: RRR without murmur, S1/S2 Resp: clear to auscultation bilaterally, normal RR and effort noted GI: soft, no tenderness, with active bowel sounds.   Assessment: Encounter Diagnoses  Name Primary?   Nausea and vomiting in adult Yes   Special screening for malignant neoplasms, colon    Early  satiety      Plan: Colonoscopy EGD  The benefits and risks of the planned procedure were described in detail with the patient or (when appropriate) their health care proxy.  Risks were outlined as including, but not limited to, bleeding, infection, perforation, adverse medication reaction leading to cardiac or pulmonary decompensation, pancreatitis (if ERCP).  The limitation of incomplete mucosal visualization was also discussed.  No guarantees or warranties were given.    The patient is appropriate for an endoscopic procedure in the ambulatory setting.   - Wilfrid Lund, MD

## 2022-01-19 ENCOUNTER — Telehealth: Payer: Self-pay | Admitting: *Deleted

## 2022-01-19 ENCOUNTER — Telehealth: Payer: Self-pay

## 2022-01-19 DIAGNOSIS — R6881 Early satiety: Secondary | ICD-10-CM

## 2022-01-19 DIAGNOSIS — R1010 Upper abdominal pain, unspecified: Secondary | ICD-10-CM

## 2022-01-19 DIAGNOSIS — R112 Nausea with vomiting, unspecified: Secondary | ICD-10-CM

## 2022-01-19 NOTE — Telephone Encounter (Signed)
Per 01/18/22 procedure report: - Do a gastric emptying study at appointment to be scheduled. - Attend GI clinic visit as scheduled 01/25/22.At that time , review biopsy results and discuss empiric trial of metoclopramide for suspected diabetic gastropathy  GES study order in epic. Secure staff message sent to radiology scheduling to set up appt. Patient notified via [place].

## 2022-01-19 NOTE — Telephone Encounter (Signed)
Spoke with [facility] office today to get pt an earlier appt. They are able to see pt for labs on Monday 7/10 in the morning and an appt with Dr.Kumar on Tuesday 7/11 at 10am. I called pt to inform her of these appt and she stated she can not make it to the Tueday appt. She will call them and explain the situation and try to get another appt but earlier than October. Joylene John, NP notified.

## 2022-01-19 NOTE — Telephone Encounter (Signed)
  Follow up Call-     01/18/2022    7:17 AM  Call back number  Post procedure Call Back phone  # [phone]  Permission to leave phone message Yes     Patient questions:  Do you have a fever, pain , or abdominal swelling? No. Pain Score  0 *  Have you tolerated food without any problems? Yes.    Have you been able to return to your normal activities? Yes.    Do you have any questions about your discharge instructions: Diet   No. Medications  No. Follow up visit  No.  Do you have questions or concerns about your Care? No.  Actions: * If pain score is 4 or above: No action needed, pain <4.

## 2022-01-22 NOTE — Telephone Encounter (Signed)
Teresa Parks stated that she called Dr. Arman Filter office to see if she could get an appointment earlier then October. She stated that an earlier appointment is not available at this time. She will be calling the office weekly  to see if an earlier appointment opened up. Requested that she call our office to let us know if and when she get an appointment scheduled.

## 2022-01-23 ENCOUNTER — Encounter: Payer: Self-pay | Admitting: Gastroenterology

## 2022-01-23 ENCOUNTER — Other Ambulatory Visit (HOSPITAL_COMMUNITY): Payer: BC Managed Care – PPO

## 2022-01-24 ENCOUNTER — Encounter: Payer: Self-pay | Admitting: Gynecologic Oncology

## 2022-01-24 NOTE — Progress Notes (Unsigned)
Called patient's insurance at [phone] to do peer to peer for MRI of the pelvis. Order ID [id number]. Spke with Evette Doffing, nurse reviewer who stated since the case is closed/denied, they do not let providers do peer to peers and it goes straight to appeals. I left a message with appeals and will write a letter along with records and fax this information to appeals. Prior auth team notified Corene Cornea).

## 2022-01-25 ENCOUNTER — Telehealth: Payer: Self-pay

## 2022-01-25 ENCOUNTER — Ambulatory Visit (INDEPENDENT_AMBULATORY_CARE_PROVIDER_SITE_OTHER): Payer: BC Managed Care – PPO | Admitting: Gastroenterology

## 2022-01-25 ENCOUNTER — Encounter: Payer: Self-pay | Admitting: Gastroenterology

## 2022-01-25 ENCOUNTER — Telehealth: Payer: Self-pay | Admitting: *Deleted

## 2022-01-25 VITALS — BP 140/74 | HR 104 | Ht 64.0 in | Wt 191.5 lb

## 2022-01-25 DIAGNOSIS — R112 Nausea with vomiting, unspecified: Secondary | ICD-10-CM | POA: Diagnosis not present

## 2022-01-25 DIAGNOSIS — R1012 Left upper quadrant pain: Secondary | ICD-10-CM | POA: Diagnosis not present

## 2022-01-25 NOTE — Telephone Encounter (Signed)
error 

## 2022-01-25 NOTE — Patient Instructions (Addendum)
You have been scheduled for a gastric emptying scan at [facility] on Wednesday 02/07/22 at 9 am. Please arrive at least 15 minutes prior to your appointment for registration. Please make certain not to have anything to eat or drink after midnight the night before your test. Hold all stomach medications (ex: Zofran, phenergan, Reglan) 48 hours prior to your test. If you need to reschedule your appointment, please contact radiology scheduling at [phone]. _____________________________________________________________________ A gastric-emptying study measures how long it takes for food to move through your stomach. There are several ways to measure stomach emptying. In the most common test, you eat food that contains a small amount of radioactive material. A scanner that detects the movement of the radioactive material is placed over your abdomen to monitor the rate at which food leaves your stomach. This test normally takes about 4 hours to complete. _____________________________________________________________________  If you are age 45 or older, your body mass index should be between 23-30. Your Body mass index is 32.87 kg/m. If this is out of the aforementioned range listed, please consider follow up with your Primary Care Provider.  If you are age 53 or younger, your body mass index should be between 19-25. Your Body mass index is 32.87 kg/m. If this is out of the aformentioned range listed, please consider follow up with your Primary Care Provider.   ________________________________________________________  The [facility] providers would like to encourage you to use [facility] to communicate with providers for non-urgent requests or questions.  Due to long hold times on the telephone, sending your provider a message by [facility] may be a faster and more efficient way to get a response.  Please allow 48 business hours for a response.  Please remember that this is for non-urgent requests.   _______________________________________________________

## 2022-01-25 NOTE — Telephone Encounter (Signed)
Spoke with [facility] Dr. Arman Filter office to attempt to move patients appointment to an earlier date than October. Scheduling staff states they are unable to open the schedule. Message left with CMA with clinical details and importance of earlier appointment. Requested return call.

## 2022-01-25 NOTE — Telephone Encounter (Signed)
Spoke with patient to verify her availability for an endocrinology appointment. Patient reports she has her MRI scheduled for 01/29/22 and will be out of town 8/3-8/7. Advised patient that our office has contacted  Dr. Arman Filter office and we are trying to move her appointment up. We will call her with appointment details if we are able to reschedule. Patient verbalized understanding.

## 2022-01-25 NOTE — Progress Notes (Signed)
01/25/2022 CONSUELLO LASSALLE [id number] 1970/11/19   HISTORY OF PRESENT ILLNESS: This is a 51 year old female who the patient Dr. Corena Pilgrim.  She is here for follow-up of her complaints of nausea/vomiting, left upper quadrant abdominal pain after her EGD and colonoscopy to discuss those results.  She states that back in April she had a significant amount of nausea and vomiting.  Since then she been having left rib pain and also left upper quadrant abdominal pain.  Says it feels like there is a brick in there.  But no further nausea and vomiting.  Her diabetes has been poorly controlled with a hemoglobin A1c of in the 8s, but increasing steadily and now around 12.  She also has a 10 cm ovarian mass that needs to be removed, but they would like her blood sugars to be better controlled as well.  She is scheduled for GES later this month.   EGD 01/18/2022:  - Normal esophagus except incidental inlet patch. - Erosive gastropathy with no bleeding and no stigmata of recent bleeding. - Mucosal changes in the duodenum. Biopsied. - Several biopsies were obtained on the greater curvature of the gastric body, on the lesser curvature of the gastric body, on the greater curvature of the gastric antrum and on the lesser curvature of the gastric antrum.  Colonoscopy 01/18/2022:  - Two diminutive polyps in the distal sigmoid colon, removed with a cold snare. Resected and retrieved. - The examination was otherwise normal on direct and retroflexion views.  1. Surgical [P], colon, sigmoid, polyp (2) TUBULAR ADENOMA AND A HYPERPLASTIC POLYP. NEGATIVE FOR HIGH-GRADE DYSPLASIA. 2. Surgical [P], duodenal MILD CHRONIC DUODENITIS WITH HYPERTROPHY OF BRUNNER'S GLANDS. THERE ARE NO DIAGNOSTIC FEATURES OF CELIAC DISEASE. 3. Surgical [P], gastric antrum and gastric body FRAGMENTS OF GASTRIC MUCOSA WITH NO SIGNIFICANT MICROSCOPIC ABNORMALITIES. H. PYLORI, INTESTINAL METAPLASIA, ATROPHY AND DYSPLASIA ARE NOT  IDENTIFIED.   Past Medical History:  Diagnosis Date   Chronic ear infection    Chronic pancreatitis ([place])    Diabetes ([place])    Diabetes mellitus type II, controlled ([place])    Hemorrhoids    HTN (hypertension)    Hyperlipidemia    Obesity    Pancreatitis, acute    Past Surgical History:  Procedure Laterality Date   KNEE ARTHROSCOPY Right    UMBILICAL HERNIA REPAIR     WISDOM TOOTH EXTRACTION      reports that she has been smoking cigarettes. She started smoking about 35 years ago. She has a 17.50 pack-year smoking history. She has never used smokeless tobacco. She reports that she does not drink alcohol and does not use drugs. family history includes Alzheimer's disease in her maternal grandfather; Diabetes in her father, mother, and paternal uncle; Healthy in her brother and daughter; Heart attack (age of onset: 76) in her father; Hypertension in her mother. Allergies  Allergen Reactions   Novocain [Procaine] Nausea And Vomiting      Outpatient Encounter Medications as of 01/25/2022  Medication Sig   amLODipine-olmesartan (AZOR) 10-40 MG tablet Take 1 tablet by mouth daily.   cyclobenzaprine (FLEXERIL) 10 MG tablet Take 1 tablet (10 mg total) by mouth at bedtime.   fenofibrate 160 MG tablet Take 1 tablet (160 mg total) by mouth daily.   glucose blood test strip Use as instructed   Icosapent Ethyl (VASCEPA) 0.5 g CAPS Take 4 capsules by mouth daily.   insulin degludec (TRESIBA FLEXTOUCH) 200 UNIT/ML FlexTouch Pen Inject 56 Units into the skin daily. And  pen needles 1/day   Insulin Pen Needle 31G X 8 MM MISC 1 each by Does not apply route daily. Use to inject insulin daily; E11.65   Lancets (FREESTYLE) lancets Use as instructed   famotidine (PEPCID) 20 MG tablet Take 1 tablet (20 mg total) by mouth daily. (Patient not taking: Reported on 01/25/2022)   No facility-administered encounter medications on file as of 01/25/2022.     REVIEW OF SYSTEMS  : All other systems reviewed and  negative except where noted in the History of Present Illness.   PHYSICAL EXAM: BP 140/74 (BP Location: Left Arm, Patient Position: Sitting, Cuff Size: Normal)   Pulse (!) 104   Ht '5\' 4"'$  (1.626 m) Comment: height measured without shoes  Wt 191 lb 8 oz (86.9 kg)   LMP 01/15/2022   BMI 32.87 kg/m  General: Well developed white female in no acute distress Head: Normocephalic and atraumatic Eyes:  Sclerae anicteric, conjunctiva pink. Ears: Normal auditory acuity Lungs: Clear throughout to auscultation; no W/R/R. Heart: Regular rate and rhythm; no M/R/G. Abdomen: Soft, non-distended.  BS present.  Non-tender. Musculoskeletal: Symmetrical with no gross deformities  Skin: No lesions on visible extremities Extremities: No edema  Neurological: Alert oriented x 4, grossly non-focal Psychological:  Alert and cooperative. Normal mood and affect  ASSESSMENT AND PLAN: *Left upper quadrant abdominal pain and nausea/vomiting: Says it feels like there is a brick in her stomach.  No recent nausea or vomiting.  Blood sugars have been poorly controlled for a while with a hemoglobin of A1c of 8, but most recently its been up to 12.  Suspect diabetic gastroparesis.  She is scheduled for an emptying scan later this month.  We discussed getting EKG by her PCP in case we would need to start metoclopramide, but she is going to hold off on that until after the results of her study.  She is in the process of trying to get a new PCP. *Left rib pain: Tender to palpation.  Started after all of the vomiting that she had back in April.  Wonder if she pulled a muscle or has some costochondritis.  Discussed using heating pad, topical creams or patches such as lidocaine or Salonpas. *History of recurrent pancreatitis 2015-2019 likely due to hypertriglyceridemia: Most recent MRI shows normal pancreas with a stable cystic structure as compared to 3 years prior.  Recommend follow-up in 2 more years. *10 cm ovarian mass: They  would like her to have surgery remove it, but need her blood sugars better controlled first. *Poorly controlled type 2 diabetes mellitus   CC:  Lafonda Mosses, MD

## 2022-01-26 NOTE — Progress Notes (Signed)
____________________________________________________________  Attending physician addendum:  Thank you for sending this case to me. I have reviewed the entire note and agree with the plan.  If her QT interval is normal, we may yet need to prescribe a trial of metoclopramide even if her gastric emptying study is normal.  She might have a subtype of diabetic gastropathy that is not necessarily causing delayed gastric emptying.  Clearly, adequate control of the glucose is of utmost importance for her long-term health and also controlling the digestive symptoms.  Wilfrid Lund, MD  ____________________________________________________________

## 2022-01-29 ENCOUNTER — Telehealth: Payer: Self-pay | Admitting: *Deleted

## 2022-01-29 ENCOUNTER — Ambulatory Visit (HOSPITAL_COMMUNITY)
Admission: RE | Admit: 2022-01-29 | Discharge: 2022-01-29 | Disposition: A | Discharge status: Home / Self Care | Payer: BC Managed Care – PPO | Source: Ambulatory Visit | Attending: Gynecologic Oncology | Admitting: Gynecologic Oncology

## 2022-01-29 DIAGNOSIS — N83202 Unspecified ovarian cyst, left side: Secondary | ICD-10-CM | POA: Diagnosis not present

## 2022-01-29 DIAGNOSIS — N888 Other specified noninflammatory disorders of cervix uteri: Secondary | ICD-10-CM | POA: Diagnosis not present

## 2022-01-29 DIAGNOSIS — R970 Elevated carcinoembryonic antigen [CEA]: Secondary | ICD-10-CM | POA: Insufficient documentation

## 2022-01-29 DIAGNOSIS — R1909 Other intra-abdominal and pelvic swelling, mass and lump: Secondary | ICD-10-CM | POA: Diagnosis not present

## 2022-01-29 DIAGNOSIS — R1900 Intra-abdominal and pelvic swelling, mass and lump, unspecified site: Secondary | ICD-10-CM | POA: Insufficient documentation

## 2022-01-29 DIAGNOSIS — N83201 Unspecified ovarian cyst, right side: Secondary | ICD-10-CM | POA: Diagnosis not present

## 2022-01-29 MED ORDER — GADOBUTROL 1 MMOL/ML IV SOLN
9.0000 mL | Freq: Once | INTRAVENOUS | Status: AC | PRN
  Administered 2022-01-29: 9 mL via INTRAVENOUS

## 2022-01-29 NOTE — Telephone Encounter (Signed)
Spoke with pt's provider office today to see if they can move pt's appt to an earlier date than October. The receptionist stated that she remembers talking to [place] about the patient last week and what is the question we have this week? They stated they do not have anything earlier at this time and will keep the pt in mind when something opens up.

## 2022-01-30 ENCOUNTER — Other Ambulatory Visit: Payer: Self-pay | Admitting: Gynecologic Oncology

## 2022-01-30 DIAGNOSIS — E1165 Type 2 diabetes mellitus with hyperglycemia: Secondary | ICD-10-CM

## 2022-01-30 NOTE — Telephone Encounter (Signed)
Spoke with patient and advised her that a referral has been placed to [facility] to hopefully have her seen prior to October. Teresa Parks will call patient regarding referral. Patient verbalized understanding.

## 2022-01-31 ENCOUNTER — Telehealth: Payer: Self-pay

## 2022-01-31 NOTE — Telephone Encounter (Signed)
Spoke with [facility] regarding urgent referral. Next available appointment is not until November. Joylene John, NP notified.   RN reached out to [facility] and they can see the patient in September (Dr. Chalmers Cater), they also have a new provider that may be able to see her sooner. Referral successfully faxed to [facility]. Patient notified.

## 2022-02-02 ENCOUNTER — Telehealth: Payer: Self-pay | Admitting: Gynecologic Oncology

## 2022-02-02 DIAGNOSIS — N764 Abscess of vulva: Secondary | ICD-10-CM | POA: Diagnosis not present

## 2022-02-02 NOTE — Telephone Encounter (Signed)
Called patient. Discussed MRI results with her. CBGs have been running around 250. Encouraged her to call [facility] endocrinology to help facilitate getting an appointment. Will tentatively plan on robotic surgery at end of August although may have to push later depending on glycemic control.Jeral Pinch MD Gynecologic Oncology

## 2022-02-05 DIAGNOSIS — N764 Abscess of vulva: Secondary | ICD-10-CM | POA: Diagnosis not present

## 2022-02-06 ENCOUNTER — Telehealth: Payer: Self-pay

## 2022-02-06 ENCOUNTER — Other Ambulatory Visit: Payer: Self-pay | Admitting: Gynecologic Oncology

## 2022-02-06 DIAGNOSIS — R1900 Intra-abdominal and pelvic swelling, mass and lump, unspecified site: Secondary | ICD-10-CM

## 2022-02-06 DIAGNOSIS — R970 Elevated carcinoembryonic antigen [CEA]: Secondary | ICD-10-CM

## 2022-02-06 DIAGNOSIS — E1165 Type 2 diabetes mellitus with hyperglycemia: Secondary | ICD-10-CM | POA: Diagnosis not present

## 2022-02-06 NOTE — Telephone Encounter (Signed)
Spoke with Teresa Parks this morning to discuss surgery scheduling and follow up on her endocrinology appointment.   Advised patient that we are tentatively scheduling her surgery for Wednesday August 30th. Patient is in agreement of this.  Patient saw endocrinology this morning. She is now taking her tresiba 50 units in the morning and 50 units in the evening. She is also taking novolog before all meals and has a freestyle libre to monitor her blood sugars. She has a follow up appointment with endocrinology next Tuesday 02/13/22. She reports at that visit they will look at the data from the freestyle libre and will be able to predict her blood sugars. Advised patient to keep our office updated on these appointments. The office will also reach out for a copy of these office notes. Patient verbalized understanding, instructed to call with any needs.

## 2022-02-07 ENCOUNTER — Encounter (HOSPITAL_COMMUNITY)
Admission: RE | Admit: 2022-02-07 | Discharge: 2022-02-07 | Disposition: A | Discharge status: Home / Self Care | Payer: BC Managed Care – PPO | Source: Ambulatory Visit | Attending: Gastroenterology | Admitting: Gastroenterology

## 2022-02-07 DIAGNOSIS — E119 Type 2 diabetes mellitus without complications: Secondary | ICD-10-CM | POA: Diagnosis not present

## 2022-02-07 DIAGNOSIS — R112 Nausea with vomiting, unspecified: Secondary | ICD-10-CM | POA: Insufficient documentation

## 2022-02-07 DIAGNOSIS — R6881 Early satiety: Secondary | ICD-10-CM | POA: Insufficient documentation

## 2022-02-07 DIAGNOSIS — R1010 Upper abdominal pain, unspecified: Secondary | ICD-10-CM | POA: Diagnosis not present

## 2022-02-07 MED ORDER — TECHNETIUM TC 99M SULFUR COLLOID
2.0000 | Freq: Once | INTRAVENOUS | Status: AC | PRN
  Administered 2022-02-07: 2 via ORAL

## 2022-02-13 DIAGNOSIS — E1165 Type 2 diabetes mellitus with hyperglycemia: Secondary | ICD-10-CM | POA: Diagnosis not present

## 2022-02-26 ENCOUNTER — Telehealth: Payer: Self-pay

## 2022-02-26 NOTE — Telephone Encounter (Signed)
Teresa Parks  states that her blood surgars are still running high. Her fasting blood sugar runs around 250.  She is going to the endocrinologist tomorrow 02-27-22 Teresa Egnor cancelled her pre op appointments. She would like her surgery cancelled for 03-14-22 and hopefully it can be r/s for the end of September. Told Teresa Kraner that this message would be given to Joylene John, NP to review.

## 2022-02-27 ENCOUNTER — Telehealth: Payer: Self-pay

## 2022-02-27 DIAGNOSIS — E1165 Type 2 diabetes mellitus with hyperglycemia: Secondary | ICD-10-CM | POA: Diagnosis not present

## 2022-02-27 DIAGNOSIS — I10 Essential (primary) hypertension: Secondary | ICD-10-CM | POA: Diagnosis not present

## 2022-02-27 NOTE — Telephone Encounter (Signed)
Records received and given to Joylene John NP for review

## 2022-02-27 NOTE — Telephone Encounter (Signed)
I called patient today to follow up from her appointment today at [facility], Eilene Ghazi NP.   Pt states her sugars were "outrageous" at the time of visit. She states added medications are: Januvia, increased Novalog and increased Glipizide. She is to follow up with them on 9/12.   Pt is aware,per Dr. Berline Lopes, blood glucose has to get under control. Surgery does not need to be pushed out much further given the complexity of the mass and elevated tumor marker. Options for surgery include 9/13 or 9/19. Pt chose 9/19 for new surgery date since it will be after her follow up appointment with Eilene Ghazi NP.   Joylene John NP, aware of new surgery date.  I have left a voicemail, with Lattie Haw in medical records, to call me back so we can get the records from the visit faxed to [place] with update on changes that will take place to get sugars better, per Tuckers request.

## 2022-02-28 ENCOUNTER — Telehealth: Payer: Self-pay

## 2022-02-28 NOTE — Telephone Encounter (Signed)
Pt called stating she is scheduled to have surgery on 9/19. She would like to move it out 2 more weeks to 10/3 if at all possible. She states it is a personal reason and did not elaborate.   I strongly relayed Dr.Tuckers message about not wanting to push the surgery out much further d/t the complexity of the mass and elevated tumor markers. She states she is aware and will not move it again.   Pt aware I will notify Dr.Tucker and Joylene John NP and will give her a call back.

## 2022-03-01 NOTE — Telephone Encounter (Signed)
LVM for patient to call back regarding Dr. Berline Lopes and Lenna Sciara Cross's  message about new surgery date.

## 2022-03-02 ENCOUNTER — Encounter (HOSPITAL_COMMUNITY): Admission: RE | Admit: 2022-03-02 | Payer: BC Managed Care – PPO | Source: Ambulatory Visit

## 2022-03-02 NOTE — Telephone Encounter (Signed)
Pt is aware, once again of the importance of Dr. Berline Lopes not wanting to push surgery out.   Also, advised, per Joylene John NP, her surgery has been moved to October 4 there is a chance this mass could be pre-cancerous or cancer (will know once removed) and delaying surgery could affect overall outcomes. Pt is aware and states "ok thank you".  Pt aware we will call her back with a pre-op appointment

## 2022-03-06 ENCOUNTER — Ambulatory Visit: Payer: BC Managed Care – PPO | Admitting: Internal Medicine

## 2022-03-14 DIAGNOSIS — R970 Elevated carcinoembryonic antigen [CEA]: Secondary | ICD-10-CM

## 2022-03-14 DIAGNOSIS — E1165 Type 2 diabetes mellitus with hyperglycemia: Secondary | ICD-10-CM

## 2022-03-14 DIAGNOSIS — R1900 Intra-abdominal and pelvic swelling, mass and lump, unspecified site: Secondary | ICD-10-CM

## 2022-03-14 DIAGNOSIS — R1012 Left upper quadrant pain: Secondary | ICD-10-CM

## 2022-03-27 DIAGNOSIS — I10 Essential (primary) hypertension: Secondary | ICD-10-CM | POA: Diagnosis not present

## 2022-03-27 DIAGNOSIS — E1165 Type 2 diabetes mellitus with hyperglycemia: Secondary | ICD-10-CM | POA: Diagnosis not present

## 2022-04-03 DIAGNOSIS — R1900 Intra-abdominal and pelvic swelling, mass and lump, unspecified site: Secondary | ICD-10-CM

## 2022-04-03 DIAGNOSIS — R970 Elevated carcinoembryonic antigen [CEA]: Secondary | ICD-10-CM

## 2022-04-03 DIAGNOSIS — R1012 Left upper quadrant pain: Secondary | ICD-10-CM

## 2022-04-03 DIAGNOSIS — E1165 Type 2 diabetes mellitus with hyperglycemia: Secondary | ICD-10-CM

## 2022-04-09 ENCOUNTER — Telehealth: Payer: Self-pay | Admitting: *Deleted

## 2022-04-09 NOTE — Telephone Encounter (Signed)
Spoke with the patient and scheduled pre op appts for tomorrow

## 2022-04-09 NOTE — Patient Instructions (Signed)
SURGICAL WAITING ROOM VISITATION Patients having surgery or a procedure may have no more than 2 support people in the waiting area - these visitors may rotate.   Children under the age of 51 must have an adult with them who is not the patient. If the patient needs to stay at the hospital during part of their recovery, the visitor guidelines for inpatient rooms apply. Pre-op nurse will coordinate an appropriate time for 1 support person to accompany patient in pre-op.  This support person may not rotate.    Please refer to the [facility] website for the visitor guidelines for Inpatients (after your surgery is over and you are in a regular room).       Your procedure is scheduled on:  04/18/2022    Report to [facility] Main Entrance    Report to admitting at   1030AM   Call this number if you have problems the morning of surgery [phone]   Do not eat food :After Midnight.            Eat a light diet the day before surgery.    After Midnight you may have the following liquids until ____ 0930am __ AM DAY OF SURGERY  Water Non-Citrus Juices (without pulp, NO RED) Carbonated Beverages Black Coffee (NO MILK/CREAM OR CREAMERS, sugar ok)  Clear Tea (NO MILK/CREAM OR CREAMERS, sugar ok) regular and decaf                             Plain Jell-O (NO RED)                                           Fruit ices (not with fruit pulp, NO RED)                                     Popsicles (NO RED)                                                               Sports drinks like Gatorade (NO RED)                              If you have questions, please contact your surgeon's office.       Oral Hygiene is also important to reduce your risk of infection.                                    Remember - BRUSH YOUR TEETH THE MORNING OF SURGERY WITH YOUR REGULAR TOOTHPASTE   Do NOT smoke after Midnight   Take these medicines the morning of surgery with A SIP OF WATER:  vascepa        DO NOT TAKE ANY ORAL DIABETIC MEDICATIONS DAY OF YOUR SURGERY  Bring CPAP mask and tubing day of surgery.  You may not have any metal on your body including hair pins, jewelry, and body piercing             Do not wear make-up, lotions, powders, perfumes/cologne, or deodorant  Do not wear nail polish including gel and S&S, artificial/acrylic nails, or any other type of covering on natural nails including finger and toenails. If you have artificial nails, gel coating, etc. that needs to be removed by a nail salon please have this removed prior to surgery or surgery may need to be canceled/ delayed if the surgeon/ anesthesia feels like they are unable to be safely monitored.   Do not shave  48 hours prior to surgery.               Men may shave face and neck.   Do not bring valuables to the hospital. [place].   Contacts, dentures or bridgework may not be worn into surgery.   Bring small overnight bag day of surgery.   DO NOT [place]. PHARMACY WILL DISPENSE MEDICATIONS LISTED ON YOUR MEDICATION LIST TO YOU DURING YOUR ADMISSION [place]!    Patients discharged on the day of surgery will not be allowed to drive home.  Someone NEEDS to stay with you for the first 24 hours after anesthesia.   Special Instructions: Bring a copy of your healthcare power of attorney and living will documents the day of surgery if you haven't scanned them before.              Please read over the following fact sheets you were given: IF [place] [phone]   If you received a COVID test during your pre-op visit  it is requested that you wear a mask when out in public, stay away from anyone that may not be feeling well and notify your surgeon if you develop symptoms. If you test positive for Covid or have been in contact with  anyone that has tested positive in the last 10 days please notify you surgeon.     [place] - Preparing for Surgery Before surgery, you can play an important role.  Because skin is not sterile, your skin needs to be as free of germs as possible.  You can reduce the number of germs on your skin by washing with CHG (chlorahexidine gluconate) soap before surgery.  CHG is an antiseptic cleaner which kills germs and bonds with the skin to continue killing germs even after washing. Please DO NOT use if you have an allergy to CHG or antibacterial soaps.  If your skin becomes reddened/irritated stop using the CHG and inform your nurse when you arrive at Short Stay. Do not shave (including legs and underarms) for at least 48 hours prior to the first CHG shower.  You may shave your face/neck. Please follow these instructions carefully:  1.  Shower with CHG Soap the night before surgery and the  morning of Surgery.  2.  If you choose to wash your hair, wash your hair first as usual with your  normal  shampoo.  3.  After you shampoo, rinse your hair and body thoroughly to remove the  shampoo.  4.  Use CHG as you would any other liquid soap.  You can apply chg directly  to the skin and wash                       Gently with a scrungie or clean washcloth.  5.  Apply the CHG Soap to your body ONLY FROM THE NECK DOWN.   Do not use on face/ open                           Wound or open sores. Avoid contact with eyes, ears mouth and genitals (private parts).                       Wash face,  Genitals (private parts) with your normal soap.             6.  Wash thoroughly, paying special attention to the area where your surgery  will be performed.  7.  Thoroughly rinse your body with warm water from the neck down.  8.  DO NOT shower/wash with your normal soap after using and rinsing off  the CHG Soap.                9.  Pat yourself dry with a clean towel.            10.  Wear clean  pajamas.            11.  Place clean sheets on your bed the night of your first shower and do not  sleep with pets. Day of Surgery : Do not apply any lotions/deodorants the morning of surgery.  Please wear clean clothes to the hospital/surgery center.  FAILURE TO FOLLOW THESE INSTRUCTIONS MAY RESULT IN THE CANCELLATION OF YOUR SURGERY PATIENT SIGNATURE_________________________________  NURSE SIGNATURE__________________________________  ________________________________________________________________________

## 2022-04-09 NOTE — Progress Notes (Signed)
Anesthesia Review:  PCP: Cardiologist : Chest x-ray : EKG : 04/10/22  Echo : Stress test: Cardiac Cath :  Activity level:  Sleep Study/ CPAP : Fasting Blood Sugar :      / Checks Blood Sugar -- times a day:   Blood Thinner/ Instructions /Last Dose: ASA / Instructions/ Last Dose :   DM- type Hgba1c- 04/10/22

## 2022-04-10 ENCOUNTER — Inpatient Hospital Stay: Payer: BC Managed Care – PPO | Attending: Gynecologic Oncology | Admitting: Gynecologic Oncology

## 2022-04-10 ENCOUNTER — Inpatient Hospital Stay (HOSPITAL_BASED_OUTPATIENT_CLINIC_OR_DEPARTMENT_OTHER): Payer: BC Managed Care – PPO | Admitting: Gynecologic Oncology

## 2022-04-10 ENCOUNTER — Encounter (HOSPITAL_COMMUNITY): Payer: Self-pay

## 2022-04-10 ENCOUNTER — Encounter (HOSPITAL_COMMUNITY)
Admission: RE | Admit: 2022-04-10 | Discharge: 2022-04-10 | Disposition: A | Discharge status: Home / Self Care | Payer: BC Managed Care – PPO | Source: Ambulatory Visit | Attending: Gynecologic Oncology | Admitting: Gynecologic Oncology

## 2022-04-10 ENCOUNTER — Telehealth: Payer: Self-pay

## 2022-04-10 ENCOUNTER — Other Ambulatory Visit: Payer: Self-pay

## 2022-04-10 VITALS — BP 124/87 | HR 91 | Temp 98.1°F | Resp 16 | Ht 65.0 in | Wt 205.0 lb

## 2022-04-10 VITALS — BP 151/98 | HR 90 | Temp 97.9°F | Resp 16 | Ht 65.0 in | Wt 209.0 lb

## 2022-04-10 DIAGNOSIS — R9431 Abnormal electrocardiogram [ECG] [EKG]: Secondary | ICD-10-CM | POA: Insufficient documentation

## 2022-04-10 DIAGNOSIS — R1012 Left upper quadrant pain: Secondary | ICD-10-CM

## 2022-04-10 DIAGNOSIS — R970 Elevated carcinoembryonic antigen [CEA]: Secondary | ICD-10-CM | POA: Diagnosis not present

## 2022-04-10 DIAGNOSIS — Z01818 Encounter for other preprocedural examination: Secondary | ICD-10-CM | POA: Diagnosis not present

## 2022-04-10 DIAGNOSIS — R1900 Intra-abdominal and pelvic swelling, mass and lump, unspecified site: Secondary | ICD-10-CM | POA: Diagnosis not present

## 2022-04-10 DIAGNOSIS — E1165 Type 2 diabetes mellitus with hyperglycemia: Secondary | ICD-10-CM | POA: Insufficient documentation

## 2022-04-10 DIAGNOSIS — F17210 Nicotine dependence, cigarettes, uncomplicated: Secondary | ICD-10-CM | POA: Insufficient documentation

## 2022-04-10 DIAGNOSIS — E119 Type 2 diabetes mellitus without complications: Secondary | ICD-10-CM

## 2022-04-10 DIAGNOSIS — E669 Obesity, unspecified: Secondary | ICD-10-CM | POA: Diagnosis not present

## 2022-04-10 DIAGNOSIS — Z794 Long term (current) use of insulin: Secondary | ICD-10-CM

## 2022-04-10 HISTORY — DX: Unspecified osteoarthritis, unspecified site: M19.90

## 2022-04-10 HISTORY — DX: Carpal tunnel syndrome, unspecified upper limb: G56.00

## 2022-04-10 HISTORY — DX: Bell's palsy: G51.0

## 2022-04-10 LAB — COMPREHENSIVE METABOLIC PANEL
ALT: 29 U/L (ref 0–44)
AST: 25 U/L (ref 15–41)
Albumin: 3.6 g/dL (ref 3.5–5.0)
Alkaline Phosphatase: 60 U/L (ref 38–126)
Anion gap: 7 (ref 5–15)
BUN: 17 mg/dL (ref 6–20)
CO2: 25 mmol/L (ref 22–32)
Calcium: 9.2 mg/dL (ref 8.9–10.3)
Chloride: 103 mmol/L (ref 98–111)
Creatinine, Ser: 0.78 mg/dL (ref 0.44–1.00)
GFR, Estimated: >60 mL/min (ref >60)
Glucose, Bld: 196 mg/dL — ABNORMAL HIGH (ref 70–99)
Potassium: 4.2 mmol/L (ref 3.5–5.1)
Sodium: 135 mmol/L (ref 135–145)
Total Bilirubin: 0.4 mg/dL (ref 0.3–1.2)
Total Protein: 7.7 g/dL (ref 6.5–8.1)

## 2022-04-10 LAB — CBC
HCT: 46 % (ref 36.0–46.0)
Hemoglobin: 14.5 g/dL (ref 12.0–15.0)
MCH: 28.3 pg (ref 26.0–34.0)
MCHC: 31.5 g/dL (ref 30.0–36.0)
MCV: 89.7 fL (ref 80.0–100.0)
Platelets: 382 10*3/uL (ref 150–400)
RBC: 5.13 MIL/uL — ABNORMAL HIGH (ref 3.87–5.11)
RDW: 15.8 % — ABNORMAL HIGH (ref 11.5–15.5)
WBC: 8.4 10*3/uL (ref 4.0–10.5)
nRBC: 0 % (ref 0.0–0.2)

## 2022-04-10 LAB — HEMOGLOBIN A1C
Hgb A1c MFr Bld: 7.7 % — ABNORMAL HIGH (ref 4.8–5.6)
Mean Plasma Glucose: 174.29 mg/dL

## 2022-04-10 LAB — GLUCOSE, CAPILLARY: Glucose-Capillary: 197 mg/dL — ABNORMAL HIGH (ref 70–99)

## 2022-04-10 MED ORDER — SENNOSIDES-DOCUSATE SODIUM 8.6-50 MG PO TABS: 2.0000 | ORAL_TABLET | Freq: Every day | ORAL | 0 refills | Status: DC

## 2022-04-10 MED ORDER — OXYCODONE HCL 5 MG PO TABS: 5.0000 mg | ORAL_TABLET | ORAL | 0 refills | Status: DC | PRN

## 2022-04-10 NOTE — Progress Notes (Signed)
Gynecologic Oncology Return Clinic Visit  04/10/2022  Reason for Visit: Treatment planning  Treatment History: Patient has a history of chronic pancreatitis since 2015.  After a recent flare which started in April, she underwent MRI of the abdomen with MRCP in early June.  This showed no evidence of pancreatitis.  There was no change noted in a benign-appearing cystic lesion at the head of the pancreas, stable over 3 years.  Indeterminate large, ovoid lesion was seen within the upper pelvis, CT of the abdomen and pelvis recommended for further characterization.   Tumor markers were performed on 12/28/2021, notable for normal CA-125 of 22.4 and CEA elevated at 9.    Pelvic ultrasound exam at [facility] on 12/28/2021 shows a uterus measuring 8 x 5.4 x 4.8 cm with an endometrial lining of 14.9 mm.  Hypoechoic focal area measuring up to 6 mm is visualized within the endometrium with stalk that appears to have a blood flow.  Left ovary measures up to 3.1 cm with a simple cyst measuring 3.1 x 3.1 x 2.6 cm.  There is a large solid mass measuring 10.3 x 9.4 x 7.1 cm with no obvious increased internal blood flow that appears within the right adnexa, possible dermoid.   Patient reports overall doing well.  She has recovered from her pancreatitis flare in April.  When she gets these now, she quickly recognizes the symptoms and stops eating, stays hydrated and drinks protein shakes.  She is currently having some intermittent pain at by her left ribs.  Otherwise denies any abdominal or pelvic pain.  She is scheduled for an EGD later this week.   She endorses a good appetite without nausea or emesis.  Denies any early satiety currently, has early satiety with pancreatitis flares.  She denies any abdominal bloating.  She endorses regular bowel and bladder function.   She reports that menses have become somewhat irregular.  She will sometimes skip periods.  She denies any intermenstrual bleeding.  She is having  this brown discharge today, which she typically does before she starts her menses.   Her history is notable for hypertension, hyperlipidemia, recurrent pancreatitis, diabetes mellitus type 2, Bell's Palsy and hepatic steatosis. Last A1c in 08/2021 was 9.2%.  Was seeing Dr. Loanne Parks, but today tells me her endocrinologist has retired.    His hemoglobin A1c was found to be 12.7% in July.  Given poorly controlled diabetes, plan was made to get the patient into endocrinology for help with glycemic control and delay surgery until blood sugars lower.  MRI of the pelvis was performed on 01/29/2022.  This showed a complex, multicomponent cystic mass centered around the left adnexa measuring 11 x 10.9 x 10.1 cm with multiple, distinct components.  At least one component with thick enhancing walls, possibly reflecting a combination of hemorrhagic or proteinaceous cyst or endometrioma.  Additional simple, benign-appearing small cyst of the right ovary.  No adenopathy.  Interval History: Patient presents today for follow-up.  She has been following closely with endocrinology.  Her last hemoglobin A1c was 8.7 and early September.  Projected A1c in mid September was 7.5%.  She has been checking her glucose at home regularly.  Her fasting blood sugars are running approximately 150.  Her postprandial and other CBGs have been at most 200.  Patient denies any abdominal or pelvic pain.  She had a normal menses last month.  Denies any bleeding since.  Reports normal bowel bladder function.  Underwent endoscopy and colonoscopy in July.  2 sigmoid polyps resected showed tubular adenoma and a hyperplastic polyp.  Past Medical/Surgical History: Past Medical History:  Diagnosis Date   Arthritis    Bell's palsy    hx of 15 years ago approx   Carpal tunnel syndrome    Chronic ear infection    Chronic pancreatitis ([place])    Diabetes ([place])    Diabetes mellitus type II, controlled ([place])    Hemorrhoids    HTN (hypertension)     pt reports b/p runs high when goes to MD office   Hyperlipidemia    Obesity    Pancreatitis, acute     Past Surgical History:  Procedure Laterality Date   KNEE ARTHROSCOPY Right    UMBILICAL HERNIA REPAIR     WISDOM TOOTH EXTRACTION      Family History  Problem Relation Age of Onset   Diabetes Mother    Hypertension Mother        Living   Heart attack Father 57       Deceased   Diabetes Father    Healthy Brother    Diabetes Paternal Uncle    Alzheimer's disease Maternal Grandfather    Healthy Daughter    Stomach cancer Neg Hx    Colon cancer Neg Hx    Esophageal cancer Neg Hx    Breast cancer Neg Hx    Ovarian cancer Neg Hx    Endometrial cancer Neg Hx    Pancreatic cancer Neg Hx    Prostate cancer Neg Hx     Social History   Socioeconomic History   Marital status: Married    Spouse name: Not on file   Number of children: 1   Years of education: Not on file   Highest education level: Not on file  Occupational History   Occupation: Chief Financial Officer   Occupation: works remotely as an Optometrist  Tobacco Use   Smoking status: Every Day    Packs/day: 1.00    Years: 35.00    Total pack years: 35.00    Types: Cigarettes    Start date: 1988   Smokeless tobacco: Never  Scientific laboratory technician Use: Never used  Substance and Sexual Activity   Alcohol use: No    Alcohol/week: 0.0 standard drinks of alcohol   Drug use: No   Sexual activity: Yes    Birth control/protection: None  Other Topics Concern   Not on file  Social History Narrative   Not on file   Social Determinants of Health   Financial Resource Strain: Not on file  Food Insecurity: Not on file  Transportation Needs: Not on file  Physical Activity: Not on file  Stress: Not on file  Social Connections: Not on file    Current Medications:  Current Outpatient Medications:    amLODipine-olmesartan (AZOR) 10-40 MG tablet, Take 1 tablet by mouth daily. (Patient taking differently: Take 1  tablet by mouth daily in the afternoon. Pt takes in the pm), Disp: 90 tablet, Rfl: 3   cyclobenzaprine (FLEXERIL) 10 MG tablet, Take 1 tablet (10 mg total) by mouth at bedtime. (Patient taking differently: Take 10 mg by mouth daily as needed for muscle spasms.), Disp: 30 tablet, Rfl: 0   fenofibrate 160 MG tablet, Take 1 tablet (160 mg total) by mouth daily. (Patient taking differently: Take 160 mg by mouth daily in the afternoon.), Disp: 90 tablet, Rfl: 1   glimepiride (AMARYL) 2 MG tablet, Take 2 mg by mouth every morning., Disp: , Rfl:  Glucagon (BAQSIMI ONE PACK) 3 MG/DOSE POWD, Place 1 Dose into the nose as needed (low blood sugar)., Disp: , Rfl:    glucose blood test strip, Use as instructed, Disp: 100 each, Rfl: 12   Icosapent Ethyl (VASCEPA) 0.5 g CAPS, Take 4 capsules by mouth daily. (Patient taking differently: Take 2 capsules by mouth daily. Pt takes in the pm), Disp: 360 capsule, Rfl: 3   insulin aspart (NOVOLOG) 100 UNIT/ML injection, Inject 13-22 Units into the skin 3 (three) times daily before meals. PEr pt she takes Novolog Sliding Scale plus 10 units Novolog with each meal  100-150-3 units      greater than 300-12 units  150-200-5 units 200-250-8 units  251-300-10 units, Disp: , Rfl:    insulin degludec (TRESIBA FLEXTOUCH) 200 UNIT/ML FlexTouch Pen, Inject 56 Units into the skin daily. And pen needles 1/day (Patient taking differently: Inject 50-58 Units into the skin See admin instructions. Inject 58 units in the morning and 50 units in the evening), Disp: 30 mL, Rfl: 3   Insulin Pen Needle 31G X 8 MM MISC, 1 each by Does not apply route daily. Use to inject insulin daily; E11.65, Disp: 90 each, Rfl: 0   JANUVIA 50 MG tablet, Take by mouth daily in the afternoon., Disp: , Rfl:    Lancets (FREESTYLE) lancets, Use as instructed, Disp: 100 each, Rfl: 12   naproxen sodium (ALEVE) 220 MG tablet, Take 220-440 mg by mouth daily as needed (pain)., Disp: , Rfl:    oxyCODONE (OXY  IR/ROXICODONE) 5 MG immediate release tablet, Take 1 tablet (5 mg total) by mouth every 4 (four) hours as needed for severe pain. For AFTER surgery only, do not take and drive, Disp: 10 tablet, Rfl: 0   senna-docusate (SENOKOT-S) 8.6-50 MG tablet, Take 2 tablets by mouth at bedtime. For AFTER surgery, do not take if having diarrhea, Disp: 30 tablet, Rfl: 0  Review of Systems: Denies appetite changes, fevers, chills, fatigue, unexplained weight changes. Denies hearing loss, neck lumps or masses, mouth sores, ringing in ears or voice changes. Denies cough or wheezing.  Denies shortness of breath. Denies chest pain or palpitations. Denies leg swelling. Denies abdominal distention, pain, blood in stools, constipation, diarrhea, nausea, vomiting, or early satiety. Denies pain with intercourse, dysuria, frequency, hematuria or incontinence. Denies hot flashes, pelvic pain, vaginal bleeding or vaginal discharge.   Denies joint pain, back pain or muscle pain/cramps. Denies itching, rash, or wounds. Denies dizziness, headaches, numbness or seizures. Denies swollen lymph nodes or glands, denies easy bruising or bleeding. Denies anxiety, depression, confusion, or decreased concentration.  Physical Exam: BP (!) 151/98 (BP Location: Right Arm, Patient Position: Sitting)   Pulse 90   Temp 97.9 F (36.6 C) (Oral)   Resp 16   Ht '5\' 5"'$  (1.651 m)   Wt 209 lb (94.8 kg)   SpO2 99%   BMI 34.78 kg/m  General: Alert, oriented, no acute distress.  HEENT: Normocephalic, atraumatic. Sclera anicteric.  Chest: Clear to auscultation bilaterally. No wheezes, rhonchi, or rales. Cardiovascular: Regular rate and rhythm, no murmurs, rubs, or gallops.  Extremities: Grossly normal range of motion. Warm, well perfused. No edema bilaterally.  Laboratory & Radiologic Studies: Cbg this am: 197  Assessment & Plan: Teresa Parks is a 51 y.o. woman with a complex adnexal mass, elevated CEA.  Glycemic control has  improved significantly.  Patient is tentatively be scheduled for surgery at the beginning of October.  To add like to proceed given improvement in  her glycemic control.  We discussed plan again today.  The patient's preference is to keep her remaining ovary if possible.  We will plan for robotic assisted bilateral salpingectomy and unilateral oophorectomy.  Enlarged ovary will be placed in an Endo Catch bag for contained cyst drainage.  It will then be sent to pathology for frozen section.  If benign, no additional procedures will be performed.  In the setting of a borderline tumor, discussed removal of the contralateral ovary as well as total hysterectomy and staging, including peritoneal biopsies and omentectomy.  If malignancy found, the patient would also undergo lymphadenectomy.  Reviewed the possibility of bilateral oophorectomy even if benign pathology given the patient's age.  We discussed that this would put her into surgical menopause and that symptoms, depending on their severity, could be treated with hormonal or nonhormonal treatment.  After this discussion, the patient's preference was to leave 1 ovary in situ if benign pathology.  Given what appeared to be possibly a polypoid structure within the uterus in the setting of the patient's abnormal menses, I have also suggested that if she does not require hysterectomy, we plan for hysteroscopy with endometrial sampling.  We reviewed plan for a robotic assisted bilateral salpingectomy, unilateral oophorectomy, possible staging including total hysterectomy, possible laparotomy, possible hysteroscopy with endometrial sampling. The risks of surgery were discussed in detail and she understands these to include infection; wound separation; hernia; vaginal cuff separation, injury to adjacent organs such as bowel, bladder, blood vessels, ureters and nerves; bleeding which may require blood transfusion; anesthesia risk; thromboembolic events; possible  death; unforeseen complications; possible need for re-exploration; medical complications such as heart attack, stroke, pleural effusion and pneumonia; and, if full lymphadenectomy is performed the risk of lymphedema and lymphocyst. The patient will receive DVT and antibiotic prophylaxis as indicated. She voiced a clear understanding. She had the opportunity to ask questions. Perioperative instructions were reviewed with her. Prescriptions for post-op medications were sent to her pharmacy of choice.  Discussed the importance of continued good glycemic control to decrease patient's perioperative risk including cardiac risk and risk of infection.  28 minutes of total time was spent for this patient encounter, including preparation, face-to-face counseling with the patient and coordination of care, and documentation of the encounter.  Jeral Pinch, MD  Division of Gynecologic Oncology  Department of Obstetrics and Gynecology  [facility] of [facility]

## 2022-04-10 NOTE — Telephone Encounter (Signed)
Per Melissa NP, called patient to advise that she needs to stop taking Icosapent Ethyl Capsules in preparation for surgery.  Patient voiced understanding.

## 2022-04-10 NOTE — H&P (View-Only) (Signed)
Gynecologic Oncology Return Clinic Visit  04/10/2022  Reason for Visit: Treatment planning  Treatment History: Patient has a history of chronic pancreatitis since 2015.  After a recent flare which started in April, she underwent MRI of the abdomen with MRCP in early June.  This showed no evidence of pancreatitis.  There was no change noted in a benign-appearing cystic lesion at the head of the pancreas, stable over 3 years.  Indeterminate large, ovoid lesion was seen within the upper pelvis, CT of the abdomen and pelvis recommended for further characterization.   Tumor markers were performed on 12/28/2021, notable for normal CA-125 of 22.4 and CEA elevated at 9.    Pelvic ultrasound exam at [facility] on 12/28/2021 shows a uterus measuring 8 x 5.4 x 4.8 cm with an endometrial lining of 14.9 mm.  Hypoechoic focal area measuring up to 6 mm is visualized within the endometrium with stalk that appears to have a blood flow.  Left ovary measures up to 3.1 cm with a simple cyst measuring 3.1 x 3.1 x 2.6 cm.  There is a large solid mass measuring 10.3 x 9.4 x 7.1 cm with no obvious increased internal blood flow that appears within the right adnexa, possible dermoid.   Patient reports overall doing well.  She has recovered from her pancreatitis flare in April.  When she gets these now, she quickly recognizes the symptoms and stops eating, stays hydrated and drinks protein shakes.  She is currently having some intermittent pain at by her left ribs.  Otherwise denies any abdominal or pelvic pain.  She is scheduled for an EGD later this week.   She endorses a good appetite without nausea or emesis.  Denies any early satiety currently, has early satiety with pancreatitis flares.  She denies any abdominal bloating.  She endorses regular bowel and bladder function.   She reports that menses have become somewhat irregular.  She will sometimes skip periods.  She denies any intermenstrual bleeding.  She is having  this brown discharge today, which she typically does before she starts her menses.   Her history is notable for hypertension, hyperlipidemia, recurrent pancreatitis, diabetes mellitus type 2, Bell's Palsy and hepatic steatosis. Last A1c in 08/2021 was 9.2%.  Was seeing Dr. Loanne Drilling, but today tells me her endocrinologist has retired.    His hemoglobin A1c was found to be 12.7% in July.  Given poorly controlled diabetes, plan was made to get the patient into endocrinology for help with glycemic control and delay surgery until blood sugars lower.  MRI of the pelvis was performed on 01/29/2022.  This showed a complex, multicomponent cystic mass centered around the left adnexa measuring 11 x 10.9 x 10.1 cm with multiple, distinct components.  At least one component with thick enhancing walls, possibly reflecting a combination of hemorrhagic or proteinaceous cyst or endometrioma.  Additional simple, benign-appearing small cyst of the right ovary.  No adenopathy.  Interval History: Patient presents today for follow-up.  She has been following closely with endocrinology.  Her last hemoglobin A1c was 8.7 and early September.  Projected A1c in mid September was 7.5%.  She has been checking her glucose at home regularly.  Her fasting blood sugars are running approximately 150.  Her postprandial and other CBGs have been at most 200.  Patient denies any abdominal or pelvic pain.  She had a normal menses last month.  Denies any bleeding since.  Reports normal bowel bladder function.  Underwent endoscopy and colonoscopy in July.  2 sigmoid polyps resected showed tubular adenoma and a hyperplastic polyp.  Past Medical/Surgical History: Past Medical History:  Diagnosis Date   Arthritis    Bell's palsy    hx of 15 years ago approx   Carpal tunnel syndrome    Chronic ear infection    Chronic pancreatitis (Teresa Parks)    Diabetes (Teresa Parks)    Diabetes mellitus type II, controlled (Teresa Parks)    Hemorrhoids    HTN (hypertension)     pt reports b/p runs high when goes to MD office   Hyperlipidemia    Obesity    Pancreatitis, acute     Past Surgical History:  Procedure Laterality Date   KNEE ARTHROSCOPY Right    UMBILICAL HERNIA REPAIR     WISDOM TOOTH EXTRACTION      Family History  Problem Relation Age of Onset   Diabetes Mother    Hypertension Mother        Living   Heart attack Father 36       Deceased   Diabetes Father    Healthy Brother    Diabetes Paternal Uncle    Alzheimer's disease Maternal Grandfather    Healthy Daughter    Stomach cancer Neg Hx    Colon cancer Neg Hx    Esophageal cancer Neg Hx    Breast cancer Neg Hx    Ovarian cancer Neg Hx    Endometrial cancer Neg Hx    Pancreatic cancer Neg Hx    Prostate cancer Neg Hx     Social History   Socioeconomic History   Marital status: Married    Spouse name: Not on file   Number of children: 1   Years of education: Not on file   Highest education level: Not on file  Occupational History   Occupation: Chief Financial Officer   Occupation: works remotely as an Optometrist  Tobacco Use   Smoking status: Every Day    Packs/day: 1.00    Years: 35.00    Total pack years: 35.00    Types: Cigarettes    Start date: 1988   Smokeless tobacco: Never  Scientific laboratory technician Use: Never used  Substance and Sexual Activity   Alcohol use: No    Alcohol/week: 0.0 standard drinks of alcohol   Drug use: No   Sexual activity: Yes    Birth control/protection: None  Other Topics Concern   Not on file  Social History Narrative   Not on file   Social Determinants of Health   Financial Resource Strain: Not on file  Food Insecurity: Not on file  Transportation Needs: Not on file  Physical Activity: Not on file  Stress: Not on file  Social Connections: Not on file    Current Medications:  Current Outpatient Medications:    amLODipine-olmesartan (AZOR) 10-40 MG tablet, Take 1 tablet by mouth daily. (Patient taking differently: Take 1  tablet by mouth daily in the afternoon. Pt takes in the pm), Disp: 90 tablet, Rfl: 3   cyclobenzaprine (FLEXERIL) 10 MG tablet, Take 1 tablet (10 mg total) by mouth at bedtime. (Patient taking differently: Take 10 mg by mouth daily as needed for muscle spasms.), Disp: 30 tablet, Rfl: 0   fenofibrate 160 MG tablet, Take 1 tablet (160 mg total) by mouth daily. (Patient taking differently: Take 160 mg by mouth daily in the afternoon.), Disp: 90 tablet, Rfl: 1   glimepiride (AMARYL) 2 MG tablet, Take 2 mg by mouth every morning., Disp: , Rfl:  Glucagon (BAQSIMI ONE PACK) 3 MG/DOSE POWD, Place 1 Dose into the nose as needed (low blood sugar)., Disp: , Rfl:    glucose blood test strip, Use as instructed, Disp: 100 each, Rfl: 12   Icosapent Ethyl (VASCEPA) 0.5 g CAPS, Take 4 capsules by mouth daily. (Patient taking differently: Take 2 capsules by mouth daily. Pt takes in the pm), Disp: 360 capsule, Rfl: 3   insulin aspart (NOVOLOG) 100 UNIT/ML injection, Inject 13-22 Units into the skin 3 (three) times daily before meals. PEr pt she takes Novolog Sliding Scale plus 10 units Novolog with each meal  100-150-3 units      greater than 300-12 units  150-200-5 units 200-250-8 units  251-300-10 units, Disp: , Rfl:    insulin degludec (TRESIBA FLEXTOUCH) 200 UNIT/ML FlexTouch Pen, Inject 56 Units into the skin daily. And pen needles 1/day (Patient taking differently: Inject 50-58 Units into the skin See admin instructions. Inject 58 units in the morning and 50 units in the evening), Disp: 30 mL, Rfl: 3   Insulin Pen Needle 31G X 8 MM MISC, 1 each by Does not apply route daily. Use to inject insulin daily; E11.65, Disp: 90 each, Rfl: 0   JANUVIA 50 MG tablet, Take by mouth daily in the afternoon., Disp: , Rfl:    Lancets (FREESTYLE) lancets, Use as instructed, Disp: 100 each, Rfl: 12   naproxen sodium (ALEVE) 220 MG tablet, Take 220-440 mg by mouth daily as needed (pain)., Disp: , Rfl:    oxyCODONE (OXY  IR/ROXICODONE) 5 MG immediate release tablet, Take 1 tablet (5 mg total) by mouth every 4 (four) hours as needed for severe pain. For AFTER surgery only, do not take and drive, Disp: 10 tablet, Rfl: 0   senna-docusate (SENOKOT-S) 8.6-50 MG tablet, Take 2 tablets by mouth at bedtime. For AFTER surgery, do not take if having diarrhea, Disp: 30 tablet, Rfl: 0  Review of Systems: Denies appetite changes, fevers, chills, fatigue, unexplained weight changes. Denies hearing loss, neck lumps or masses, mouth sores, ringing in ears or voice changes. Denies cough or wheezing.  Denies shortness of breath. Denies chest pain or palpitations. Denies leg swelling. Denies abdominal distention, pain, blood in stools, constipation, diarrhea, nausea, vomiting, or early satiety. Denies pain with intercourse, dysuria, frequency, hematuria or incontinence. Denies hot flashes, pelvic pain, vaginal bleeding or vaginal discharge.   Denies joint pain, back pain or muscle pain/cramps. Denies itching, rash, or wounds. Denies dizziness, headaches, numbness or seizures. Denies swollen lymph nodes or glands, denies easy bruising or bleeding. Denies anxiety, depression, confusion, or decreased concentration.  Physical Exam: BP (!) 151/98 (BP Location: Right Arm, Patient Position: Sitting)   Pulse 90   Temp 97.9 F (36.6 C) (Oral)   Resp 16   Ht '5\' 5"'$  (1.651 m)   Wt 209 lb (94.8 kg)   SpO2 99%   BMI 34.78 kg/m  General: Alert, oriented, no acute distress.  HEENT: Normocephalic, atraumatic. Sclera anicteric.  Chest: Clear to auscultation bilaterally. No wheezes, rhonchi, or rales. Cardiovascular: Regular rate and rhythm, no murmurs, rubs, or gallops.  Extremities: Grossly normal range of motion. Warm, well perfused. No edema bilaterally.  Laboratory & Radiologic Studies: Cbg this am: 197  Assessment & Plan: Teresa Parks is a 51 y.o. woman with a complex adnexal mass, elevated CEA.  Glycemic control has  improved significantly.  Patient is tentatively be scheduled for surgery at the beginning of October.  To add like to proceed given improvement in  her glycemic control.  We discussed plan again today.  The patient's preference is to keep her remaining ovary if possible.  We will plan for robotic assisted bilateral salpingectomy and unilateral oophorectomy.  Enlarged ovary will be placed in an Endo Catch bag for contained cyst drainage.  It will then be sent to pathology for frozen section.  If benign, no additional procedures will be performed.  In the setting of a borderline tumor, discussed removal of the contralateral ovary as well as total hysterectomy and staging, including peritoneal biopsies and omentectomy.  If malignancy found, the patient would also undergo lymphadenectomy.  Reviewed the possibility of bilateral oophorectomy even if benign pathology given the patient's age.  We discussed that this would put her into surgical menopause and that symptoms, depending on their severity, could be treated with hormonal or nonhormonal treatment.  After this discussion, the patient's preference was to leave 1 ovary in situ if benign pathology.  Given what appeared to be possibly a polypoid structure within the uterus in the setting of the patient's abnormal menses, I have also suggested that if she does not require hysterectomy, we plan for hysteroscopy with endometrial sampling.  We reviewed plan for a robotic assisted bilateral salpingectomy, unilateral oophorectomy, possible staging including total hysterectomy, possible laparotomy, possible hysteroscopy with endometrial sampling. The risks of surgery were discussed in detail and she understands these to include infection; wound separation; hernia; vaginal cuff separation, injury to adjacent organs such as bowel, bladder, blood vessels, ureters and nerves; bleeding which may require blood transfusion; anesthesia risk; thromboembolic events; possible  death; unforeseen complications; possible need for re-exploration; medical complications such as heart attack, stroke, pleural effusion and pneumonia; and, if full lymphadenectomy is performed the risk of lymphedema and lymphocyst. The patient will receive DVT and antibiotic prophylaxis as indicated. She voiced a clear understanding. She had the opportunity to ask questions. Perioperative instructions were reviewed with her. Prescriptions for post-op medications were sent to her pharmacy of choice.  Discussed the importance of continued good glycemic control to decrease patient's perioperative risk including cardiac risk and risk of infection.  28 minutes of total time was spent for this patient encounter, including preparation, face-to-face counseling with the patient and coordination of care, and documentation of the encounter.  Jeral Pinch, MD  Division of Gynecologic Oncology  Department of Obstetrics and Gynecology  [facility]

## 2022-04-10 NOTE — Patient Instructions (Signed)
Preparing for your Surgery  Plan for surgery on April 18, 2022 with Dr. Jeral Pinch at [facility] will be scheduled for robotic assisted laparoscopic unilateral oophorectomy (removal of one ovary), bilateral salpingectomy (removal of both fallopian tubes), possible bilateral salpingo-oophorectomy (removal of both ovaries and fallopian tubes), possible robotic assisted total hysterectomy (removal of the uterus and cervix), possible staging if a cancer is identified, possible hysteroscopy with sampling from the endometrium.   Pre-operative Testing -(Done) You will receive a phone call from presurgical testing at [facility] to arrange for a pre-operative appointment and lab work.  -Bring your insurance card, copy of an advanced directive if applicable, medication list  -At that visit, you will be asked to sign a consent for a possible blood transfusion in case a transfusion becomes necessary during surgery.  The need for a blood transfusion is rare but having consent is a necessary part of your care.     -You should not be taking blood thinners or aspirin at least ten days prior to surgery unless instructed by your surgeon.  -Do not take supplements such as fish oil (omega 3), red yeast rice, turmeric before your surgery. You want to avoid medications with aspirin in them including headache powders such as BC or Goody's), Excedrin migraine.  Day Before Surgery at [facility] will be asked to take in a light diet the day before surgery. You will be advised you can have clear liquids up until 3 hours before your surgery.    Eat a light diet the day before surgery.  Examples including soups, broths, toast, yogurt, mashed potatoes.  AVOID GAS PRODUCING FOODS. Things to avoid include carbonated beverages (fizzy beverages, sodas), raw fruits and raw vegetables (uncooked), or beans.   If your bowels are filled with gas, your surgeon will have difficulty visualizing your pelvic  organs which increases your surgical risks.  Your role in recovery Your role is to become active as soon as directed by your doctor, while still giving yourself time to heal.  Rest when you feel tired. You will be asked to do the following in order to speed your recovery:  - Cough and breathe deeply. This helps to clear and expand your lungs and can prevent pneumonia after surgery.  - [place]. Do mild physical activity. Walking or moving your legs help your circulation and body functions return to normal. Do not try to get up or walk alone the first time after surgery.   -If you develop swelling on one leg or the other, pain in the back of your leg, redness/warmth in one of your legs, please call the office or go to the Emergency Room to have a doppler to rule out a blood clot. For shortness of breath, chest pain-seek care in the Emergency Room as soon as possible. - Actively manage your pain. Managing your pain lets you move in comfort. We will ask you to rate your pain on a scale of zero to 10. It is your responsibility to tell your doctor or nurse where and how much you hurt so your pain can be treated.  Special Considerations -If you are diabetic, you may be placed on insulin after surgery to have closer control over your blood sugars to promote healing and recovery.  This does not mean that you will be discharged on insulin.  If applicable, your oral antidiabetics will be resumed when you are tolerating a solid diet.  -Your final pathology  results from surgery should be available around one week after surgery and the results will be relayed to you when available.  -Dr. Lahoma Crocker is the surgeon that assists your GYN Oncologist with surgery.  If you end up staying the night, the next day after your surgery you will either see Dr. Berline Lopes or Dr. Lahoma Crocker.  -FMLA forms can be faxed to [phone] and please allow 5-7 business days for completion.  Pain  Management After Surgery -You have been prescribed your pain medication and bowel regimen medications before surgery so that you can have these available when you are discharged from the hospital. The pain medication is for use ONLY AFTER surgery and a new prescription will not be given.   -Make sure that you have Tylenol and Ibuprofen (OR ALEVE, DO NOT TAKE TOGETHER SINCE THEY WORK SIMILARLY) IF YOU ARE ABLE TO TAKE THESE MEDICATIONS at home to use on a regular basis after surgery for pain control. We recommend alternating the medications every hour to six hours since they work differently and are processed in the body differently for pain relief.  -Review the attached handout on narcotic use and their risks and side effects.   Bowel Regimen -You have been prescribed Sennakot-S to take nightly to prevent constipation especially if you are taking the narcotic pain medication intermittently.  It is important to prevent constipation and drink adequate amounts of liquids. You can stop taking this medication when you are not taking pain medication and you are back on your normal bowel routine.  Risks of Surgery Risks of surgery are low but include bleeding, infection, damage to surrounding structures, re-operation, blood clots, and very rarely death.   Blood Transfusion Information (For the consent to be signed before surgery)  We will be checking your blood type before surgery so in case of emergencies, we will know what type of blood you would need.                                            WHAT IS A BLOOD TRANSFUSION?  A transfusion is the replacement of blood or some of its parts. Blood is made up of multiple cells which provide different functions. Red blood cells carry oxygen and are used for blood loss replacement. White blood cells fight against infection. Platelets control bleeding. Plasma helps clot blood. Other blood products are available for specialized needs, such as hemophilia or  other clotting disorders. BEFORE THE TRANSFUSION  Who gives blood for transfusions?  You may be able to donate blood to be used at a later date on yourself (autologous donation). Relatives can be asked to donate blood. This is generally not any safer than if you have received blood from a stranger. The same precautions are taken to ensure safety when a relative's blood is donated. Healthy volunteers who are fully evaluated to make sure their blood is safe. This is blood bank blood. Transfusion therapy is the safest it has ever been in the practice of medicine. Before blood is taken from a donor, a complete history is taken to make sure that person has no history of diseases nor engages in risky social behavior (examples are intravenous drug use or sexual activity with multiple partners). The donor's travel history is screened to minimize risk of transmitting infections, such as malaria. The donated blood is tested for signs of infectious diseases, such  as HIV and hepatitis. The blood is then tested to be sure it is compatible with you in order to minimize the chance of a transfusion reaction. If you or a relative donates blood, this is often done in anticipation of surgery and is not appropriate for emergency situations. It takes many days to process the donated blood. RISKS AND COMPLICATIONS Although transfusion therapy is very safe and saves many lives, the main dangers of transfusion include:  Getting an infectious disease. Developing a transfusion reaction. This is an allergic reaction to something in the blood you were given. Every precaution is taken to prevent this. The decision to have a blood transfusion has been considered carefully by your caregiver before blood is given. Blood is not given unless the benefits outweigh the risks.  AFTER SURGERY INSTRUCTIONS  Return to work: 4-6 weeks if applicable  Activity: 1. Be up and out of the bed during the day.  Take a nap if needed.  You may walk  up steps but be careful and use the hand rail.  Stair climbing will tire you more than you think, you may need to stop part way and rest.   2. No lifting or straining for 6 weeks over 10 pounds. No pushing, pulling, straining for 6 weeks.  3. No driving for around 1 week(s).  Do not drive if you are taking narcotic pain medicine and make sure that your reaction time has returned.   4. You can shower as soon as the next day after surgery. Shower daily.  Use your regular soap and water (not directly on the incision) and pat your incision(s) dry afterwards; don't rub.  No tub baths or submerging your body in water until cleared by your surgeon. If you have the soap that was given to you by pre-surgical testing that was used before surgery, you do not need to use it afterwards because this can irritate your incisions.   5. No sexual activity and nothing in the vagina for 4 weeks, 8-10 weeks if you have a hysterectomy.  6. You may experience a small amount of clear drainage from your incisions, which is normal.  If the drainage persists, increases, or changes color please call the office.  7. Do not use creams, lotions, or ointments such as neosporin on your incisions after surgery until advised by your surgeon because they can cause removal of the dermabond glue on your incisions.    8. You may experience vaginal spotting after surgery or around the 6-8 week mark from surgery when the stitches at the top of the vagina begin to dissolve (if you have a hysterectomy).  The spotting is normal but if you experience heavy bleeding, call our office.  9. Take Tylenol or ibuprofen (OR ALEVE) first for pain if you are able to take these medications and only use narcotic pain medication for severe pain not relieved by the Tylenol or Ibuprofen.  Monitor your Tylenol intake to a max of 4,000 mg in a 24 hour period. You can alternate these medications after surgery.  Diet: 1. Low sodium Heart Healthy Diet is  recommended but you are cleared to resume your normal (before surgery) diet after your procedure.  2. It is safe to use a laxative, such as Miralax or Colace, if you have difficulty moving your bowels. You have been prescribed Sennakot-S to take at bedtime every evening after surgery to keep bowel movements regular and to prevent constipation.    Wound Care: 1. Keep clean and  dry.  Shower daily.  Reasons to call the Doctor: Fever - Oral temperature greater than 100.4 degrees Fahrenheit Foul-smelling vaginal discharge Difficulty urinating Nausea and vomiting Increased pain at the site of the incision that is unrelieved with pain medicine. Difficulty breathing with or without chest pain New calf pain especially if only on one side Sudden, continuing increased vaginal bleeding with or without clots.   Contacts: For questions or concerns you should contact:  Dr. Jeral Pinch at [phone]  Joylene John, NP at [phone]  After Hours: call [phone] and have the GYN Oncologist paged/contacted (after 5 pm or on the weekends).  Messages sent via mychart are for non-urgent matters and are not responded to after hours so for urgent needs, please call the after hours number.

## 2022-04-13 NOTE — Progress Notes (Signed)
Patient here for follow up with Dr. Berline Lopes and for a pre-operative appointment prior to her scheduled surgery on April 18, 2022. She is scheduled for robotic assisted laparoscopic unilateral oophorectomy, bilateral salpingectomy, possible bilateral salpingo-oophorectomy (removal of both ovaries and fallopian tubes), possible robotic assisted total hysterectomy (removal of the uterus and cervix), possible staging if a cancer is identified, possible hysteroscopy with sampling from the endometrium. The surgery was discussed in detail.  See after visit summary for additional details. Visual aids used to discuss items related to surgery.      Discussed post-op pain management in detail including the aspects of the enhanced recovery pathway.  Advised her that a new prescription would be sent in for oxycodone and it is only to be used for after her upcoming surgery.  We discussed the use of tylenol post-op and to monitor for a maximum of 4,000 mg in a 24 hour period.  Also prescribed sennakot to be used after surgery and to hold if having loose stools.  Discussed bowel regimen in detail.     Discussed the use of SCDs and measures to take at home to prevent DVT including frequent mobility.  Reportable signs and symptoms of DVT discussed. Post-operative instructions discussed and expectations for after surgery. Incisional care discussed as well including reportable signs and symptoms including erythema, drainage, wound separation.     5 minutes spent with the patient.  Verbalizing understanding of material discussed. No needs or concerns voiced at the end of the visit.   Advised patient to call for any needs.  Advised that her post-operative medications had been prescribed and could be picked up at any time.    This appointment is included in the global surgical bundle as pre-operative teaching and has no charge.

## 2022-04-17 ENCOUNTER — Telehealth: Payer: Self-pay

## 2022-04-17 NOTE — Telephone Encounter (Signed)
Telephone call to check on pre-operative status.  Patient compliant with pre-operative instructions.  Reinforced nothing to eat after midnight. Clear liquids until 0930. Patient to arrive at 1030.  No questions or concerns voiced.  Instructed to call for any needs.

## 2022-04-18 ENCOUNTER — Other Ambulatory Visit: Payer: Self-pay

## 2022-04-18 ENCOUNTER — Encounter (HOSPITAL_COMMUNITY): Payer: Self-pay | Admitting: Gynecologic Oncology

## 2022-04-18 ENCOUNTER — Ambulatory Visit (HOSPITAL_COMMUNITY)
Admission: RE | Admit: 2022-04-18 | Discharge: 2022-04-18 | Disposition: A | Discharge status: Home / Self Care | Payer: BC Managed Care – PPO | Source: Ambulatory Visit | Attending: Gynecologic Oncology | Admitting: Gynecologic Oncology

## 2022-04-18 ENCOUNTER — Ambulatory Visit (HOSPITAL_COMMUNITY): Payer: BC Managed Care – PPO | Admitting: Anesthesiology

## 2022-04-18 ENCOUNTER — Ambulatory Visit (HOSPITAL_COMMUNITY): Payer: BC Managed Care – PPO | Admitting: Physician Assistant

## 2022-04-18 ENCOUNTER — Encounter (HOSPITAL_COMMUNITY)
Admission: RE | Disposition: A | Discharge status: Home / Self Care | Payer: Self-pay | Source: Ambulatory Visit | Attending: Gynecologic Oncology

## 2022-04-18 DIAGNOSIS — E785 Hyperlipidemia, unspecified: Secondary | ICD-10-CM | POA: Insufficient documentation

## 2022-04-18 DIAGNOSIS — Z6833 Body mass index (BMI) 33.0-33.9, adult: Secondary | ICD-10-CM | POA: Diagnosis not present

## 2022-04-18 DIAGNOSIS — K861 Other chronic pancreatitis: Secondary | ICD-10-CM | POA: Insufficient documentation

## 2022-04-18 DIAGNOSIS — R1012 Left upper quadrant pain: Secondary | ICD-10-CM

## 2022-04-18 DIAGNOSIS — N840 Polyp of corpus uteri: Secondary | ICD-10-CM | POA: Insufficient documentation

## 2022-04-18 DIAGNOSIS — I10 Essential (primary) hypertension: Secondary | ICD-10-CM | POA: Insufficient documentation

## 2022-04-18 DIAGNOSIS — D271 Benign neoplasm of left ovary: Secondary | ICD-10-CM | POA: Diagnosis not present

## 2022-04-18 DIAGNOSIS — G709 Myoneural disorder, unspecified: Secondary | ICD-10-CM | POA: Diagnosis not present

## 2022-04-18 DIAGNOSIS — R1900 Intra-abdominal and pelvic swelling, mass and lump, unspecified site: Secondary | ICD-10-CM

## 2022-04-18 DIAGNOSIS — R9389 Abnormal findings on diagnostic imaging of other specified body structures: Secondary | ICD-10-CM

## 2022-04-18 DIAGNOSIS — F17210 Nicotine dependence, cigarettes, uncomplicated: Secondary | ICD-10-CM | POA: Insufficient documentation

## 2022-04-18 DIAGNOSIS — E1165 Type 2 diabetes mellitus with hyperglycemia: Secondary | ICD-10-CM | POA: Diagnosis not present

## 2022-04-18 DIAGNOSIS — R970 Elevated carcinoembryonic antigen [CEA]: Secondary | ICD-10-CM

## 2022-04-18 DIAGNOSIS — N838 Other noninflammatory disorders of ovary, fallopian tube and broad ligament: Secondary | ICD-10-CM | POA: Diagnosis not present

## 2022-04-18 DIAGNOSIS — Z01818 Encounter for other preprocedural examination: Secondary | ICD-10-CM

## 2022-04-18 HISTORY — PX: XI ROBOTIC ASSISTED OOPHORECTOMY: SHX6823

## 2022-04-18 HISTORY — PX: DILATATION & CURETTAGE/HYSTEROSCOPY WITH MYOSURE: SHX6511

## 2022-04-18 LAB — POCT PREGNANCY, URINE: Preg Test, Ur: NEGATIVE

## 2022-04-18 LAB — TYPE AND SCREEN
ABO/RH(D): O POS
Antibody Screen: NEGATIVE

## 2022-04-18 LAB — GLUCOSE, CAPILLARY
Glucose-Capillary: 227 mg/dL — ABNORMAL HIGH (ref 70–99)
Glucose-Capillary: 160 mg/dL — ABNORMAL HIGH (ref 70–99)

## 2022-04-18 LAB — ABO/RH: ABO/RH(D): O POS

## 2022-04-18 SURGERY — OOPHORECTOMY, ROBOT-ASSISTED
Anesthesia: General

## 2022-04-18 MED ORDER — OXYCODONE HCL 5 MG/5ML PO SOLN: 5.0000 mg | Freq: Once | ORAL | Status: DC | PRN

## 2022-04-18 MED ORDER — PHENYLEPHRINE 80 MCG/ML (10ML) SYRINGE FOR IV PUSH (FOR BLOOD PRESSURE SUPPORT)
PREFILLED_SYRINGE | INTRAVENOUS | Status: DC | PRN
  Administered 2022-04-18 (×3): 160 ug via INTRAVENOUS

## 2022-04-18 MED ORDER — SCOPOLAMINE 1 MG/3DAYS TD PT72
1.0000 | MEDICATED_PATCH | TRANSDERMAL | Status: DC
  Administered 2022-04-18: 1.5 mg via TRANSDERMAL
  Filled 2022-04-18: qty 1

## 2022-04-18 MED ORDER — LACTATED RINGERS IV SOLN: INTRAVENOUS | Status: DC

## 2022-04-18 MED ORDER — LACTATED RINGERS IR SOLN
Status: DC | PRN
  Administered 2022-04-18: 1000 mL

## 2022-04-18 MED ORDER — DEXAMETHASONE SODIUM PHOSPHATE 4 MG/ML IJ SOLN: 4.0000 mg | INTRAMUSCULAR | Status: DC

## 2022-04-18 MED ORDER — LACTATED RINGERS IV SOLN: INTRAVENOUS | Status: DC | PRN

## 2022-04-18 MED ORDER — PROPOFOL 10 MG/ML IV BOLUS
INTRAVENOUS | Status: DC | PRN
  Administered 2022-04-18: 200 mg via INTRAVENOUS

## 2022-04-18 MED ORDER — MIDAZOLAM HCL 5 MG/5ML IJ SOLN
INTRAMUSCULAR | Status: DC | PRN
  Administered 2022-04-18: 2 mg via INTRAVENOUS

## 2022-04-18 MED ORDER — AMISULPRIDE (ANTIEMETIC) 5 MG/2ML IV SOLN
10.0000 mg | Freq: Once | INTRAVENOUS | Status: AC
  Administered 2022-04-18: 10 mg via INTRAVENOUS

## 2022-04-18 MED ORDER — ONDANSETRON HCL 4 MG/2ML IJ SOLN
4.0000 mg | Freq: Four times a day (QID) | INTRAMUSCULAR | Status: AC | PRN
  Administered 2022-04-18: 4 mg via INTRAVENOUS

## 2022-04-18 MED ORDER — PROPOFOL 10 MG/ML IV BOLUS
INTRAVENOUS | Status: AC
  Filled 2022-04-18: qty 20

## 2022-04-18 MED ORDER — BUPIVACAINE HCL 0.25 % IJ SOLN
INTRAMUSCULAR | Status: DC | PRN
  Administered 2022-04-18: 35 mL

## 2022-04-18 MED ORDER — CHLORHEXIDINE GLUCONATE 0.12 % MT SOLN
15.0000 mL | Freq: Once | OROMUCOSAL | Status: AC
  Administered 2022-04-18: 15 mL via OROMUCOSAL

## 2022-04-18 MED ORDER — OXYCODONE HCL 5 MG PO TABS: 5.0000 mg | ORAL_TABLET | Freq: Once | ORAL | Status: DC | PRN

## 2022-04-18 MED ORDER — ROCURONIUM BROMIDE 10 MG/ML (PF) SYRINGE
PREFILLED_SYRINGE | INTRAVENOUS | Status: DC | PRN
  Administered 2022-04-18: 10 mg via INTRAVENOUS
  Administered 2022-04-18: 80 mg via INTRAVENOUS

## 2022-04-18 MED ORDER — AMISULPRIDE (ANTIEMETIC) 5 MG/2ML IV SOLN
INTRAVENOUS | Status: AC
  Filled 2022-04-18: qty 4

## 2022-04-18 MED ORDER — SODIUM CHLORIDE 0.9 % IR SOLN
Status: DC | PRN
  Administered 2022-04-18: 3000 mL

## 2022-04-18 MED ORDER — MIDAZOLAM HCL 2 MG/2ML IJ SOLN
INTRAMUSCULAR | Status: AC
  Filled 2022-04-18: qty 2

## 2022-04-18 MED ORDER — CELECOXIB 200 MG PO CAPS
200.0000 mg | ORAL_CAPSULE | ORAL | Status: AC
  Administered 2022-04-18: 200 mg via ORAL
  Filled 2022-04-18: qty 1

## 2022-04-18 MED ORDER — FENTANYL CITRATE (PF) 250 MCG/5ML IJ SOLN
INTRAMUSCULAR | Status: AC
  Filled 2022-04-18: qty 5

## 2022-04-18 MED ORDER — LIDOCAINE 2% (20 MG/ML) 5 ML SYRINGE
INTRAMUSCULAR | Status: DC | PRN
  Administered 2022-04-18: 80 mg via INTRAVENOUS
  Administered 2022-04-18: 1.5 mg/kg/h via INTRAVENOUS

## 2022-04-18 MED ORDER — ACETAMINOPHEN 500 MG PO TABS
1000.0000 mg | ORAL_TABLET | ORAL | Status: AC
  Administered 2022-04-18: 1000 mg via ORAL
  Filled 2022-04-18: qty 2

## 2022-04-18 MED ORDER — ORAL CARE MOUTH RINSE: 15.0000 mL | Freq: Once | OROMUCOSAL | Status: AC

## 2022-04-18 MED ORDER — FENTANYL CITRATE (PF) 100 MCG/2ML IJ SOLN
INTRAMUSCULAR | Status: DC | PRN
  Administered 2022-04-18 (×2): 50 ug via INTRAVENOUS
  Administered 2022-04-18: 100 ug via INTRAVENOUS
  Administered 2022-04-18: 50 ug via INTRAVENOUS

## 2022-04-18 MED ORDER — ONDANSETRON HCL 4 MG/2ML IJ SOLN
INTRAMUSCULAR | Status: AC
  Filled 2022-04-18: qty 2

## 2022-04-18 MED ORDER — GABAPENTIN 300 MG PO CAPS
300.0000 mg | ORAL_CAPSULE | ORAL | Status: AC
  Administered 2022-04-18: 300 mg via ORAL
  Filled 2022-04-18: qty 1

## 2022-04-18 MED ORDER — HEPARIN SODIUM (PORCINE) 5000 UNIT/ML IJ SOLN
5000.0000 [IU] | INTRAMUSCULAR | Status: AC
  Administered 2022-04-18: 5000 [IU] via SUBCUTANEOUS
  Filled 2022-04-18: qty 1

## 2022-04-18 MED ORDER — KETAMINE HCL 50 MG/5ML IJ SOSY
PREFILLED_SYRINGE | INTRAMUSCULAR | Status: AC
  Filled 2022-04-18: qty 5

## 2022-04-18 MED ORDER — BUPIVACAINE HCL 0.25 % IJ SOLN
INTRAMUSCULAR | Status: AC
  Filled 2022-04-18: qty 1

## 2022-04-18 MED ORDER — SUGAMMADEX SODIUM 200 MG/2ML IV SOLN
INTRAVENOUS | Status: DC | PRN
  Administered 2022-04-18: 200 mg via INTRAVENOUS

## 2022-04-18 MED ORDER — KETAMINE HCL 10 MG/ML IJ SOLN
INTRAMUSCULAR | Status: DC | PRN
  Administered 2022-04-18: 30 mg via INTRAVENOUS

## 2022-04-18 MED ORDER — STERILE WATER FOR IRRIGATION IR SOLN
Status: DC | PRN
  Administered 2022-04-18: 1000 mL

## 2022-04-18 MED ORDER — LIDOCAINE HCL (PF) 1 % IJ SOLN
INTRAMUSCULAR | Status: AC
  Filled 2022-04-18: qty 30

## 2022-04-18 MED ORDER — FENTANYL CITRATE PF 50 MCG/ML IJ SOSY: 25.0000 ug | PREFILLED_SYRINGE | INTRAMUSCULAR | Status: DC | PRN

## 2022-04-18 SURGICAL SUPPLY — 92 items
ADH SKN CLS APL DERMABOND .7 (GAUZE/BANDAGES/DRESSINGS) ×1
AGENT HMST KT MTR STRL THRMB (HEMOSTASIS)
APL ESCP 34 STRL LF DISP (HEMOSTASIS)
APPLICATOR SURGIFLO ENDO (HEMOSTASIS) IMPLANT
BAG COUNTER SPONGE SURGICOUNT (BAG) ×1 IMPLANT
BAG LAPAROSCOPIC 12 15 PORT 16 (BASKET) IMPLANT
BAG RETRIEVAL 12/15 (BASKET) ×1
BAG SPNG CNTER NS LX DISP (BAG) ×1
BIPOLAR CUTTING LOOP 21FR (ELECTRODE)
BLADE SURG SZ10 CARB STEEL (BLADE) IMPLANT
COVER BACK TABLE 60X90IN (DRAPES) ×1 IMPLANT
COVER TIP SHEARS 8 DVNC (MISCELLANEOUS) ×1 IMPLANT
COVER TIP SHEARS 8MM DA VINCI (MISCELLANEOUS) ×1
DERMABOND ADVANCED .7 DNX12 (GAUZE/BANDAGES/DRESSINGS) ×1 IMPLANT
DEVICE MYOSURE LITE (MISCELLANEOUS) IMPLANT
DEVICE MYOSURE REACH (MISCELLANEOUS) IMPLANT
DILATOR CANAL MILEX (MISCELLANEOUS) IMPLANT
DRAPE ARM DVNC X/XI (DISPOSABLE) ×4 IMPLANT
DRAPE COLUMN DVNC XI (DISPOSABLE) ×1 IMPLANT
DRAPE DA VINCI XI ARM (DISPOSABLE) ×4
DRAPE DA VINCI XI COLUMN (DISPOSABLE) ×1
DRAPE SHEET LG 3/4 BI-LAMINATE (DRAPES) ×1 IMPLANT
DRAPE SURG IRRIG POUCH 19X23 (DRAPES) ×1 IMPLANT
DRSG OPSITE POSTOP 4X6 (GAUZE/BANDAGES/DRESSINGS) IMPLANT
DRSG OPSITE POSTOP 4X8 (GAUZE/BANDAGES/DRESSINGS) IMPLANT
ELECT PENCIL ROCKER SW 15FT (MISCELLANEOUS) IMPLANT
ELECT REM PT RETURN 15FT ADLT (MISCELLANEOUS) ×1 IMPLANT
GAUZE 4X4 16PLY ~~LOC~~+RFID DBL (SPONGE) ×2 IMPLANT
GLOVE BIO SURGEON STRL SZ 6 (GLOVE) ×4 IMPLANT
GLOVE BIO SURGEON STRL SZ 6.5 (GLOVE) ×1 IMPLANT
GOWN STRL REUS W/ TWL LRG LVL3 (GOWN DISPOSABLE) ×4 IMPLANT
GOWN STRL REUS W/TWL LRG LVL3 (GOWN DISPOSABLE) ×4
GRASPER SUT TROCAR 14GX15 (MISCELLANEOUS) IMPLANT
HOLDER FOLEY CATH W/STRAP (MISCELLANEOUS) IMPLANT
IRRIG SUCT STRYKERFLOW 2 WTIP (MISCELLANEOUS) ×2
IRRIGATION SUCT STRKRFLW 2 WTP (MISCELLANEOUS) ×1 IMPLANT
IV NS IRRIG 3000ML ARTHROMATIC (IV SOLUTION) ×1 IMPLANT
KIT PROCEDURE DA VINCI SI (MISCELLANEOUS)
KIT PROCEDURE DVNC SI (MISCELLANEOUS) IMPLANT
KIT PROCEDURE FLUENT (KITS) IMPLANT
KIT TURNOVER KIT A (KITS) IMPLANT
LIGASURE IMPACT 36 18CM CVD LR (INSTRUMENTS) IMPLANT
LOOP CUTTING BIPOLAR 21FR (ELECTRODE) IMPLANT
MANIPULATOR ADVINCU DEL 3.0 PL (MISCELLANEOUS) IMPLANT
MANIPULATOR ADVINCU DEL 3.5 PL (MISCELLANEOUS) IMPLANT
MANIPULATOR UTERINE 4.5 ZUMI (MISCELLANEOUS) IMPLANT
MYOSURE XL FIBROID (MISCELLANEOUS)
NDL HYPO 21X1.5 SAFETY (NEEDLE) ×1 IMPLANT
NDL SPNL 18GX3.5 QUINCKE PK (NEEDLE) IMPLANT
NEEDLE HYPO 21X1.5 SAFETY (NEEDLE) ×1 IMPLANT
NEEDLE SPNL 18GX3.5 QUINCKE PK (NEEDLE) IMPLANT
OBTURATOR OPTICAL STANDARD 8MM (TROCAR) ×1
OBTURATOR OPTICAL STND 8 DVNC (TROCAR) ×1
OBTURATOR OPTICALSTD 8 DVNC (TROCAR) ×1 IMPLANT
PACK ROBOT GYN CUSTOM WL (TRAY / TRAY PROCEDURE) ×1 IMPLANT
PACK VAGINAL WOMENS (CUSTOM PROCEDURE TRAY) ×1 IMPLANT
PAD OB MATERNITY 4.3X12.25 (PERSONAL CARE ITEMS) IMPLANT
PAD POSITIONING PINK XL (MISCELLANEOUS) ×1 IMPLANT
PORT ACCESS TROCAR AIRSEAL 12 (TROCAR) ×1 IMPLANT
PORT ACCESS TROCAR AIRSEAL 5M (TROCAR) ×1
SEAL CANN UNIV 5-8 DVNC XI (MISCELLANEOUS) ×4 IMPLANT
SEAL ROD LENS SCOPE MYOSURE (ABLATOR) IMPLANT
SEAL XI 5MM-8MM UNIVERSAL (MISCELLANEOUS) ×4
SET TRI-LUMEN FLTR TB AIRSEAL (TUBING) ×1 IMPLANT
SOL PREP POV-IOD 4OZ 10% (MISCELLANEOUS) ×2 IMPLANT
SPIKE FLUID TRANSFER (MISCELLANEOUS) ×1 IMPLANT
SPONGE T-LAP 18X18 ~~LOC~~+RFID (SPONGE) IMPLANT
SURGIFLO W/THROMBIN 8M KIT (HEMOSTASIS) IMPLANT
SUT MNCRL AB 4-0 PS2 18 (SUTURE) IMPLANT
SUT PDS AB 1 TP1 96 (SUTURE) IMPLANT
SUT VIC AB 0 CT1 27 (SUTURE)
SUT VIC AB 0 CT1 27XBRD ANTBC (SUTURE) IMPLANT
SUT VIC AB 2-0 CT1 27 (SUTURE)
SUT VIC AB 2-0 CT1 TAPERPNT 27 (SUTURE) IMPLANT
SUT VIC AB 4-0 PS2 18 (SUTURE) ×2 IMPLANT
SUT VICRYL 0 UR6 27IN ABS (SUTURE) IMPLANT
SUT VLOC 180 0 9IN  GS21 (SUTURE)
SUT VLOC 180 0 9IN GS21 (SUTURE) IMPLANT
SYR 10ML LL (SYRINGE) IMPLANT
SYS BAG RETRIEVAL 10MM (BASKET) ×1
SYS WOUND ALEXIS 18CM MED (MISCELLANEOUS)
SYSTEM BAG RETRIEVAL 10MM (BASKET) IMPLANT
SYSTEM TISS REMOVAL MYOSURE XL (MISCELLANEOUS) IMPLANT
SYSTEM WOUND ALEXIS 18CM MED (MISCELLANEOUS) IMPLANT
TOWEL OR 17X26 10 PK STRL BLUE (TOWEL DISPOSABLE) ×1 IMPLANT
TOWEL OR NON WOVEN STRL DISP B (DISPOSABLE) IMPLANT
TRAP SPECIMEN MUCUS 40CC (MISCELLANEOUS) IMPLANT
TRAY FOLEY MTR SLVR 16FR STAT (SET/KITS/TRAYS/PACK) ×1 IMPLANT
UNDERPAD 30X36 HEAVY ABSORB (UNDERPADS AND DIAPERS) ×2 IMPLANT
WATER STERILE IRR 1000ML POUR (IV SOLUTION) ×1 IMPLANT
WATER STERILE IRR 500ML POUR (IV SOLUTION) ×1 IMPLANT
YANKAUER SUCT BULB TIP 10FT TU (MISCELLANEOUS) IMPLANT

## 2022-04-18 NOTE — Op Note (Signed)
OPERATIVE NOTE  Pre-operative Diagnosis: Adnexal mass, thickened endometrium  Post-operative Diagnosis: same, dermoid tumor, endometrial polyp  Operation: Robotic-assisted unilateral oophorectomy, bilateral salpingectomy, hysteroscopy with endometrial sampling including polypectomy Extreme morbid obesity requiring additional OR personnel for positioning and retraction. Obesity made retroperitoneal visualization limited and increased the complexity of the case and necessitated additional instrumentation for retraction. Obesity related complexity increased the duration of the procedure by 30 minutes.   Surgeon: Jeral Pinch MD  Assistant Surgeon: Lahoma Crocker MD (an MD assistant was necessary for tissue manipulation, management of robotic instrumentation, retraction and positioning due to the complexity of the case and hospital policies).   Anesthesia: GET  Urine Output: 150cc  Operative Findings:  On EUA, 8 centimeter mobile uterus, mass filling the cul-de-sac, mobile.  On intra-abdominal entry, normal upper abdominal survey.  Normal appearing small and large bowel.  Several permanent sutures noted just anterior to the umbilicus.  This area was avoided during port placement.  No ascites.  Right ovary normal appearing.  Left fallopian tube with a 3 cm paratubal cyst at the distal end of the fallopian tube.  Uterus 8 cm and normal in appearance.  Left ovary replaced by multiple masses including a dilated fallopian tube, smooth white or cystic appearing mass measuring approximately 8 cm and an enlarged white mass favored to be the ovary with significant gyri.  Upon contained drainage and extraction of the left adnexal masses, hair as well as fat tissue noted consistent with a dermoid.  This was favored to be the diagnosis on gross examination by pathology.  No intra-abdominal or pelvic evidence of disease.  On hysteroscopy, 1 cm polypoid appearing lesion at the posterior uterine segment.   Some more fluffy appearing endometrium along the right uterus, sampled with the Myosure.  Estimated Blood Loss:  50 cc      Total IV Fluids: see I&O flowsheet         Specimens: bilateral tubes, left ovary, pelvic washings, endometrial curettings         Complications:  None apparent; patient tolerated the procedure well.         Disposition: PACU - hemodynamically stable.  Procedure Details  The patient was seen in the Holding Room. The risks, benefits, complications, treatment options, and expected outcomes were discussed with the patient.  The patient concurred with the proposed plan, giving informed consent.  The site of surgery properly noted/marked. The patient was identified as Cobi Aldape and the procedure verified as a Robotic-assisted bilateral salpingo-oophorectomy with any other indicated procedures.   After induction of anesthesia, the patient was draped and prepped in the usual sterile manner. Patient was placed in supine position after anesthesia and draped and prepped in the usual sterile manner as follows: Her arms were tucked to her side with all appropriate precautions.  The shoulders were stabilized with padded shoulder blocks applied to the acromium processes.  The patient was placed in the semi-lithotomy position in [place].  The perineum and vagina were prepped with Betadine. The patient was prepped with CholoraPrep and draped after the CholoraPrep had been allowed to dry for 3 minutes.  A Time Out was held and the above information confirmed.  The urethra was prepped with Betadine. Foley catheter was placed.  A sterile speculum was placed in the vagina.  The cervix was grasped with a single-tooth tenaculum. The cervix was dilated with Kennon Rounds dilators.  The ZUMI uterine manipulator with a medium colpotomizer ring was placed without difficulty.  A pneum  occluder balloon was placed over the manipulator.  OG tube placement was confirmed and to suction.   Next, a 10 mm  skin incision was made 1 cm below the subcostal margin in the midclavicular line.  The 5 mm Optiview port and scope was used for direct entry.  Opening pressure was under 10 mm CO2.  The abdomen was insufflated and the findings were noted as above.   At this point and all points during the procedure, the patient's intra-abdominal pressure did not exceed 15 mmHg. Next, an 8 mm skin incision was made superior and to the left of the umbilicus and a right and left port were placed about 8 cm lateral to the robot port on the right and left side.  A fourth arm was placed on the right.  The 5 mm assist trocar was exchanged for a 10-12 mm port. All ports were placed under direct visualization.  The patient was placed in steep Trendelenburg.  Bowel was folded away into the upper abdomen.  The robot was docked in the normal manner.  The physiologic adhesions of the sigmoid mesentery to the left sidewall and left infundibulopelvic ligament were lysed.  The left peritoneum was opened parallel to the IP ligament to open the retroperitoneal space. The round ligaments were preserved. The ureter was noted to be on the medial leaf of the broad ligament.  The peritoneum above the ureter was incised distally.  The utero-ovarian ligament and fallopian tube were isolated, cauterized, and transected, just lateral to the uterine fundus.  The adnexa was then elevated and the infundibular ligament was skeletonized, cauterized, and cut, freeing the left adnexa.  This was placed in a 15 mm Endo Catch bag inserted through the assist trocar.    Attention was then turned to the right.  The right fallopian tube was cauterized and cut at its proximal end.  The fallopian tube separated from the underlying ovary by cauterizing along the mesosalpinx up to the distal portion of the tube.  Here, given some vascularity to the paratubal cyst, combination of monopolar and bipolar electrocautery was used to transect the fallopian tube and paratubal  cyst free from the ovary.  This was placed in a 10 mm Endo Catch bag.  Irrigation was used and excellent hemostasis was achieved.  Robotic instruments were removed under direct visulaization.  The robot was undocked.   Both bags were brought up to and through the left upper quadrant incision.  Contained cyst drainage was performed of the right fallopian tube.  The left adnexal masses were decompressed and morcellated in a contained fashion.  The left adnexa was then sent to pathology for frozen section.  Attention was then turned below.  The uterine manipulator was removed.  Speculum was replaced in the vagina and the cervix was well visualized.  Single-tooth tenaculum was placed on the anterior lip of the cervix.  The Myosure hysteroscope was then inserted through the cervix with visualization of the endometrium.  Pictures were taken.  The MyoSure reach was then used to remove the endometrial polyp and take a sampling of the endometrium.  The hysteroscope was removed.  Hemostasis of the tenaculum site was noted.  Vagina was irrigated.  Frozen section returned at this time.  Surgeons gowns and gloves were changed and attention was turned superiorly again.  With the abdomen still insufflated and under direct visualization, the fascia at the 12 mm port was closed using 0 Vicryl with a PMI fascial closure device.  The abdomen  was then desufflated.  The subcuticular tissue was closed with 4-0 Vicryl and the skin was closed with 4-0 Monocryl in a subcuticular manner.  Dermabond was applied.    The vagina was swabbed with minimal bleeding noted. Foley catheter was removed.  All sponge, lap and needle counts were correct x  3.   The patient was transferred to the recovery room in stable condition.  Jeral Pinch, MD

## 2022-04-18 NOTE — Discharge Instructions (Addendum)
AFTER SURGERY INSTRUCTIONS   Return to work: 4-6 weeks if applicable   Activity: 1. Be up and out of the bed during the day.  Take a nap if needed.  You may walk up steps but be careful and use the hand rail.  Stair climbing will tire you more than you think, you may need to stop part way and rest.    2. No lifting or straining for 6 weeks over 10 pounds. No pushing, pulling, straining for 6 weeks.   3. No driving for around 1 week(s).  Do not drive if you are taking narcotic pain medicine and make sure that your reaction time has returned.    4. You can shower as soon as the next day after surgery. Shower daily.  Use your regular soap and water (not directly on the incision) and pat your incision(s) dry afterwards; don't rub.  No tub baths or submerging your body in water until cleared by your surgeon. If you have the soap that was given to you by pre-surgical testing that was used before surgery, you do not need to use it afterwards because this can irritate your incisions.    5. No sexual activity and nothing in the vagina for 4 weeks.   6. You may experience a small amount of clear drainage from your incisions, which is normal.  If the drainage persists, increases, or changes color please call the office.   7. Do not use creams, lotions, or ointments such as neosporin on your incisions after surgery until advised by your surgeon because they can cause removal of the dermabond glue on your incisions.     8. You may experience vaginal spotting after surgery.  The spotting is normal but if you experience heavy bleeding, call our office.   9. Take Tylenol or ibuprofen (OR ALEVE) first for pain if you are able to take these medications and only use narcotic pain medication for severe pain not relieved by the Tylenol or Ibuprofen.  Monitor your Tylenol intake to a max of 4,000 mg in a 24 hour period. You can alternate these medications after surgery.   Diet: 1. Low sodium Heart Healthy Diet is  recommended but you are cleared to resume your normal (before surgery) diet after your procedure.   2. It is safe to use a laxative, such as Miralax or Colace, if you have difficulty moving your bowels. You have been prescribed Sennakot-S to take at bedtime every evening after surgery to keep bowel movements regular and to prevent constipation.     Wound Care: 1. Keep clean and dry.  Shower daily.   Reasons to call the Doctor: Fever - Oral temperature greater than 100.4 degrees Fahrenheit Foul-smelling vaginal discharge Difficulty urinating Nausea and vomiting Increased pain at the site of the incision that is unrelieved with pain medicine. Difficulty breathing with or without chest pain New calf pain especially if only on one side Sudden, continuing increased vaginal bleeding with or without clots.   Contacts: For questions or concerns you should contact:   Dr. Jeral Pinch at [phone]   Joylene John, NP at [phone]   After Hours: call [phone] and have the GYN Oncologist paged/contacted (after 5 pm or on the weekends).   Messages sent via mychart are for non-urgent matters and are not responded to after hours so for urgent needs, please call the after hours number.

## 2022-04-18 NOTE — Anesthesia Postprocedure Evaluation (Signed)
Anesthesia Post Note  Patient: Teresa Parks  Procedure(s) Performed: XI ROBOTIC ASSISTED BILATERAL  SALPINGECTOMY LEFT OOPHORECTOMY DILATATION & CURETTAGE/HYSTEROSCOPY WITH [place]     Patient location during evaluation: PACU Anesthesia Type: General Level of consciousness: awake and alert Pain management: pain level controlled Vital Signs Assessment: post-procedure vital signs reviewed and stable Respiratory status: spontaneous breathing, nonlabored ventilation, respiratory function stable and patient connected to nasal cannula oxygen Cardiovascular status: blood pressure returned to baseline and stable Postop Assessment: no apparent nausea or vomiting Anesthetic complications: no   No notable events documented.  Last Vitals:  Vitals:   04/18/22 1715 04/18/22 1730  BP: (!) 141/70 112/64  Pulse: 88 84  Resp: (!) 26 (!) 25  Temp:    SpO2: 94% 92%    Last Pain:  Vitals:   04/18/22 1730  TempSrc:   PainSc: 0-No pain                 [name] S

## 2022-04-18 NOTE — Anesthesia Procedure Notes (Signed)
Procedure Name: Intubation Date/Time: 04/18/2022 1:56 PM  Performed by: Gean Maidens, CRNAPre-anesthesia Checklist: Patient identified, Emergency Drugs available, Suction available, Patient being monitored and Timeout performed Patient Re-evaluated:Patient Re-evaluated prior to induction Oxygen Delivery Method: Circle system utilized Preoxygenation: Pre-oxygenation with 100% oxygen Induction Type: IV induction Ventilation: Mask ventilation without difficulty Laryngoscope Size: Mac and 4 Grade View: Grade II Tube type: Oral Tube size: 7.0 mm Number of attempts: 2 Airway Equipment and Method: Stylet Placement Confirmation: ETT inserted through vocal cords under direct vision, positive ETCO2 and breath sounds checked- equal and bilateral Secured at: 21 cm Tube secured with: Tape Dental Injury: Teeth and Oropharynx as per pre-operative assessment

## 2022-04-18 NOTE — Brief Op Note (Signed)
04/18/2022  4:40 PM  PATIENT:  Teresa Parks  51 y.o. female  PRE-OPERATIVE DIAGNOSIS:  COMPLEX PELVIC MASS  POST-OPERATIVE DIAGNOSIS:  COMPLEX PELVIC MASS  PROCEDURE:  Procedure(s): XI ROBOTIC ASSISTED BILATERAL  SALPINGECTOMY LEFT OOPHORECTOMY (N/A) DILATATION & CURETTAGE/HYSTEROSCOPY WITH MYOSURE (N/A)  SURGEON:  Surgeon(s) and Role:    Lafonda Mosses, MD - Primary    * Lahoma Crocker, MD - Assisting  EBL:  50 mL   BLOOD ADMINISTERED:none  DRAINS: none   LOCAL MEDICATIONS USED:  MARCAINE     SPECIMEN: Bilateral fallopian tubes, left ovary, endometrial curettings, pelvic washings  DISPOSITION OF SPECIMEN:  PATHOLOGY  COUNTS:  YES  TOURNIQUET:  * No tourniquets in log *  DICTATION: .Note written in EPIC  PLAN OF CARE: Discharge to home after PACU  PATIENT DISPOSITION:  PACU - hemodynamically stable.   Delay start of Pharmacological VTE agent (>24hrs) due to surgical blood loss or risk of bleeding: not applicable

## 2022-04-18 NOTE — Interval H&P Note (Signed)
History and Physical Interval Note:  04/18/2022 12:20 PM  Teresa Parks  has presented today for surgery, with the diagnosis of COMPLEX PELVIC MASS.  The various methods of treatment have been discussed with the patient and family. After consideration of risks, benefits and other options for treatment, the patient has consented to  Procedure(s): XI ROBOTIC ASSISTED UNILATERAL OOPHORECTOMY, POSSIBLE BILATERAL OOPHORECTOMY (N/A) XI ROBOTIC ASSISTED SALPINGECTOMY, POSSIBLE  ROBOTIC ASSISTED TOTAL HYSTERCTOMY, POSSIBLE STAGING (N/A) POSSIBLE DILATATION & CURETTAGE/HYSTEROSCOPY WITH MYOSURE (N/A) as a surgical intervention.  The patient's history has been reviewed, patient examined, no change in status, stable for surgery.  I have reviewed the patient's chart and labs.  Questions were answered to the patient's satisfaction.     Lafonda Mosses

## 2022-04-18 NOTE — Anesthesia Preprocedure Evaluation (Signed)
Anesthesia Evaluation  Patient identified by MRN, date of birth, ID band Patient awake    Reviewed: Allergy & Precautions, H&P , NPO status , Patient's Chart, lab work & pertinent test results  Airway Mallampati: II   Neck ROM: full    Dental   Pulmonary Current Smoker,    breath sounds clear to auscultation       Cardiovascular hypertension,  Rhythm:regular Rate:Normal     Neuro/Psych  Neuromuscular disease    GI/Hepatic H/o pancreatitis   Endo/Other  diabetes, Type 2  Renal/GU      Musculoskeletal  (+) Arthritis ,   Abdominal   Peds  Hematology   Anesthesia Other Findings   Reproductive/Obstetrics                             Anesthesia Physical Anesthesia Plan  ASA: 2  Anesthesia Plan: General   Post-op Pain Management:    Induction: Intravenous  PONV Risk Score and Plan: 2 and Ondansetron, Dexamethasone, Midazolam and Treatment may vary due to age or medical condition  Airway Management Planned: Oral ETT  Additional Equipment:   Intra-op Plan:   Post-operative Plan: Extubation in OR  Informed Consent: I have reviewed the patients History and Physical, chart, labs and discussed the procedure including the risks, benefits and alternatives for the proposed anesthesia with the patient or authorized representative who has indicated his/her understanding and acceptance.     Dental advisory given  Plan Discussed with: CRNA, Anesthesiologist and Surgeon  Anesthesia Plan Comments:         Anesthesia Quick Evaluation

## 2022-04-18 NOTE — Transfer of Care (Signed)
Immediate Anesthesia Transfer of Care Note  Patient: Teresa Parks  Procedure(s) Performed: XI ROBOTIC ASSISTED BILATERAL  SALPINGECTOMY LEFT OOPHORECTOMY DILATATION & CURETTAGE/HYSTEROSCOPY WITH MYOSURE  Patient Location: PACU  Anesthesia Type:General  Level of Consciousness: sedated, patient cooperative and responds to stimulation  Airway & Oxygen Therapy: Patient Spontanous Breathing and Patient connected to face mask oxygen  Post-op Assessment: Report given to RN and Post -op Vital signs reviewed and stable  Post vital signs: Reviewed and stable  Last Vitals:  Vitals Value Taken Time  BP 152/55 04/18/22 1625  Temp    Pulse 87 04/18/22 1626  Resp 29 04/18/22 1626  SpO2 91 % 04/18/22 1626  Vitals shown include unvalidated device data.  Last Pain:  Vitals:   04/18/22 1056  TempSrc:   PainSc: 0-No pain         Complications: No notable events documented.

## 2022-04-18 NOTE — Progress Notes (Signed)
Called and made Dr.Hodierne aware of patients oxygen level fluctuating between 90%-93%, some grogginess still and has slight nausea mostly motion related and requesting to go home. Dr.Hodierne cleared patient to be discharged home with family.

## 2022-04-19 ENCOUNTER — Encounter: Payer: Self-pay | Admitting: Gynecologic Oncology

## 2022-04-19 ENCOUNTER — Encounter (HOSPITAL_COMMUNITY): Payer: Self-pay | Admitting: Gynecologic Oncology

## 2022-04-19 ENCOUNTER — Telehealth: Payer: Self-pay

## 2022-04-19 NOTE — Telephone Encounter (Signed)
Spoke with Teresa Parks this morning. She states she is eating, drinking and urinating well. She has not had a BM yet but is passing gas. She is taking senokot as prescribed and encouraged her to drink plenty of water. She denies fever or chills. Incisions are dry and intact. She rates her pain 4/10. Her pain is controlled with Oxycodone only last night. States she has not had to take anything today.   She mentioned having right side muscle soreness. Advised to use heating pad on low heat, ambulate. Use Tylenol as directed.Explained all the muscle maneuvering/instruments used can cause soreness after surgery. She voiced an understanding and will call back if gets worse.     Instructed to call office with any fever, chills, purulent drainage, uncontrolled pain or any other questions or concerns. Patient verbalizes understanding.   Pt aware of post op appointments as well as the office number [phone] and after hours number [phone] to call if she has any questions or concerns

## 2022-04-20 LAB — SURGICAL PATHOLOGY

## 2022-04-20 LAB — CYTOLOGY - NON PAP

## 2022-04-24 ENCOUNTER — Telehealth: Payer: Self-pay

## 2022-04-24 ENCOUNTER — Inpatient Hospital Stay: Payer: BC Managed Care – PPO | Admitting: Gynecologic Oncology

## 2022-04-24 NOTE — Telephone Encounter (Signed)
Called patient to advise that Dr. Berline Lopes did not need to speak with patient since pathology negative for malignancy, unless patient has specific questions. Requested that patient call clinic to confirm receipt of this message.

## 2022-04-26 ENCOUNTER — Ambulatory Visit: Payer: BC Managed Care – PPO | Admitting: Internal Medicine

## 2022-05-10 ENCOUNTER — Encounter: Payer: Self-pay | Admitting: Gynecologic Oncology

## 2022-05-11 ENCOUNTER — Encounter: Payer: Self-pay | Admitting: Gynecologic Oncology

## 2022-05-11 ENCOUNTER — Other Ambulatory Visit: Payer: Self-pay

## 2022-05-11 ENCOUNTER — Inpatient Hospital Stay: Payer: BC Managed Care – PPO | Attending: Gynecologic Oncology | Admitting: Gynecologic Oncology

## 2022-05-11 VITALS — BP 146/68 | HR 92 | Temp 98.5°F | Resp 16 | Ht 65.0 in | Wt 212.0 lb

## 2022-05-11 DIAGNOSIS — R1900 Intra-abdominal and pelvic swelling, mass and lump, unspecified site: Secondary | ICD-10-CM

## 2022-05-11 DIAGNOSIS — R9389 Abnormal findings on diagnostic imaging of other specified body structures: Secondary | ICD-10-CM

## 2022-05-11 DIAGNOSIS — R970 Elevated carcinoembryonic antigen [CEA]: Secondary | ICD-10-CM

## 2022-05-11 DIAGNOSIS — Z9079 Acquired absence of other genital organ(s): Secondary | ICD-10-CM

## 2022-05-11 DIAGNOSIS — Z90721 Acquired absence of ovaries, unilateral: Secondary | ICD-10-CM

## 2022-05-11 NOTE — Progress Notes (Signed)
Gynecologic Oncology Return Clinic Visit  05/11/22  Reason for Visit: Follow-up after surgery  Treatment History: 04/18/22: Robotic-assisted unilateral oophorectomy, bilateral salpingectomy, hysteroscopy with endometrial sampling including polypectomy  Interval History: Doing well.  Denies any significant abdominal pain.  Has occasional mild twinges.  Reports regular bowel and bladder function.  Denies any vaginal bleeding.  Past Medical/Surgical History: Past Medical History:  Diagnosis Date   Arthritis    Bell's palsy    hx of 15 years ago approx   Carpal tunnel syndrome    Chronic ear infection    Chronic pancreatitis ([place])    Diabetes ([place])    Diabetes mellitus type II, controlled ([place])    Hemorrhoids    HTN (hypertension)    pt reports b/p runs high when goes to MD office   Hyperlipidemia    Obesity    Pancreatitis, acute     Past Surgical History:  Procedure Laterality Date   DILATATION & CURETTAGE/HYSTEROSCOPY WITH MYOSURE N/A 04/18/2022   Procedure: [place];  Surgeon: Lafonda Mosses, MD;  Location: [facility];  Service: Gynecology;  Laterality: N/A;   KNEE ARTHROSCOPY Right    UMBILICAL HERNIA REPAIR     WISDOM TOOTH EXTRACTION     XI ROBOTIC ASSISTED OOPHORECTOMY N/A 04/18/2022   Procedure: XI ROBOTIC ASSISTED BILATERAL  SALPINGECTOMY LEFT OOPHORECTOMY;  Surgeon: Lafonda Mosses, MD;  Location: [facility];  Service: Gynecology;  Laterality: N/A;    Family History  Problem Relation Age of Onset   Diabetes Mother    Hypertension Mother        Living   Heart attack Father 24       Deceased   Diabetes Father    Healthy Brother    Diabetes Paternal Uncle    Alzheimer's disease Maternal Grandfather    Healthy Daughter    Stomach cancer Neg Hx    Colon cancer Neg Hx    Esophageal cancer Neg Hx    Breast cancer Neg Hx    Ovarian cancer Neg Hx    Endometrial cancer Neg Hx    Pancreatic cancer Neg Hx    Prostate cancer  Neg Hx     Social History   Socioeconomic History   Marital status: Married    Spouse name: Not on file   Number of children: 1   Years of education: Not on file   Highest education level: Not on file  Occupational History   Occupation: Chief Financial Officer   Occupation: works remotely as an Optometrist  Tobacco Use   Smoking status: Every Day    Packs/day: 1.00    Years: 35.00    Total pack years: 35.00    Types: Cigarettes    Start date: 1988   Smokeless tobacco: Never  Scientific laboratory technician Use: Never used  Substance and Sexual Activity   Alcohol use: No    Alcohol/week: 0.0 standard drinks of alcohol   Drug use: No   Sexual activity: Yes    Birth control/protection: None  Other Topics Concern   Not on file  Social History Narrative   Not on file   Social Determinants of Health   Financial Resource Strain: Not on file  Food Insecurity: Not on file  Transportation Needs: Not on file  Physical Activity: Not on file  Stress: Not on file  Social Connections: Not on file    Current Medications:  Current Outpatient Medications:    amLODipine-olmesartan (AZOR) 10-40 MG tablet, Take 1  tablet by mouth daily. (Patient taking differently: Take 1 tablet by mouth daily in the afternoon. Pt takes in the pm), Disp: 90 tablet, Rfl: 3   cyclobenzaprine (FLEXERIL) 10 MG tablet, Take 1 tablet (10 mg total) by mouth at bedtime. (Patient taking differently: Take 10 mg by mouth daily as needed for muscle spasms.), Disp: 30 tablet, Rfl: 0   fenofibrate 160 MG tablet, Take 1 tablet (160 mg total) by mouth daily. (Patient taking differently: Take 160 mg by mouth daily in the afternoon.), Disp: 90 tablet, Rfl: 1   glimepiride (AMARYL) 2 MG tablet, Take 2 mg by mouth every morning., Disp: , Rfl:    Glucagon (BAQSIMI ONE PACK) 3 MG/DOSE POWD, Place 1 Dose into the nose as needed (low blood sugar)., Disp: , Rfl:    glucose blood test strip, Use as instructed, Disp: 100 each, Rfl: 12    Icosapent Ethyl (VASCEPA) 0.5 g CAPS, Take 4 capsules by mouth daily. (Patient taking differently: Take 2 capsules by mouth daily. Pt takes in the pm), Disp: 360 capsule, Rfl: 3   insulin aspart (NOVOLOG) 100 UNIT/ML injection, Inject 13-22 Units into the skin 3 (three) times daily before meals. PEr pt she takes Novolog Sliding Scale plus 10 units Novolog with each meal  100-150-3 units      greater than 300-12 units  150-200-5 units 200-250-8 units  251-300-10 units, Disp: , Rfl:    insulin degludec (TRESIBA FLEXTOUCH) 200 UNIT/ML FlexTouch Pen, Inject 56 Units into the skin daily. And pen needles 1/day (Patient taking differently: Inject 50-58 Units into the skin See admin instructions. Inject 58 units in the morning and 50 units in the evening), Disp: 30 mL, Rfl: 3   Insulin Pen Needle 31G X 8 MM MISC, 1 each by Does not apply route daily. Use to inject insulin daily; E11.65, Disp: 90 each, Rfl: 0   JANUVIA 50 MG tablet, Take by mouth daily in the afternoon., Disp: , Rfl:    Lancets (FREESTYLE) lancets, Use as instructed, Disp: 100 each, Rfl: 12   naproxen sodium (ALEVE) 220 MG tablet, Take 220-440 mg by mouth daily as needed (pain)., Disp: , Rfl:   Review of Systems: + Fatigue, occasional abdominal discomfort Denies appetite changes, fevers, chills, unexplained weight changes. Denies hearing loss, neck lumps or masses, mouth sores, ringing in ears or voice changes. Denies cough or wheezing.  Denies shortness of breath. Denies chest pain or palpitations. Denies leg swelling. Denies abdominal distention, blood in stools, constipation, diarrhea, nausea, vomiting, or early satiety. Denies pain with intercourse, dysuria, frequency, hematuria or incontinence. Denies hot flashes, pelvic pain, vaginal bleeding or vaginal discharge.   Denies joint pain, back pain or muscle pain/cramps. Denies itching, rash, or wounds. Denies dizziness, headaches, numbness or seizures. Denies swollen lymph nodes or  glands, denies easy bruising or bleeding. Denies anxiety, depression, confusion, or decreased concentration.  Physical Exam: BP (!) 146/68 (BP Location: Right Arm, Patient Position: Sitting)   Pulse 92   Temp 98.5 F (36.9 C) (Oral)   Resp 16   Ht '5\' 5"'$  (1.651 m)   Wt 212 lb (96.2 kg)   SpO2 97%   BMI 35.28 kg/m  General: Alert, oriented, no acute distress. HEENT: Posterior oropharynx clear, sclera anicteric. Chest: Unlabored breathing on room air. Abdomen: Obese, soft, nontender.  Normoactive bowel sounds.  No masses or hepatosplenomegaly appreciated.  Well-healed incisions.  Mattress sutures taken out of one incision remaining Dermabond removed. Extremities: Grossly normal range of motion.  Warm,  well perfused.  No edema bilaterally.  Laboratory & Radiologic Studies: A. OVARY AND FALLOPIAN TUBE, LEFT, RESECTION:  - Benign mature cystic teratoma (dermoid cyst), 10 cm  - Segment of benign unremarkable fallopian tube  - No evidence of malignancy   B. FALLOPIAN TUBE, RIGHT, RESECTION:  - Complete cross-section of benign unremarkable fallopian tube   C. ENDOMETRIAL CURRETTINGS:  - Benign endometrial polyp  - Negative for hyperplasia or malignancy   Assessment & Plan: REIDA HEM is a 51 y.o. 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  Patient is doing well from a postoperative standpoint.  Discussed pathology results again and the patient was given a copy of her pathology report.  Reviewed continued expectations and postoperative restrictions.  Advised patient to follow-up with her OB/GYN for routine healthcare needs.  She will need continued Pap testing.  20 minutes of total time was spent for this patient encounter, including preparation, face-to-face counseling with the patient and coordination of care, and documentation of the encounter.  Jeral Pinch, MD   Division of Gynecologic Oncology  Department of Obstetrics and Gynecology  [facility]

## 2022-05-19 ENCOUNTER — Telehealth: Payer: BC Managed Care – PPO | Admitting: Physician Assistant

## 2022-05-19 DIAGNOSIS — R051 Acute cough: Secondary | ICD-10-CM

## 2022-05-19 DIAGNOSIS — Z720 Tobacco use: Secondary | ICD-10-CM

## 2022-05-19 MED ORDER — BENZONATATE 100 MG PO CAPS: 100.0000 mg | ORAL_CAPSULE | Freq: Two times a day (BID) | ORAL | 0 refills | Status: DC | PRN

## 2022-05-19 MED ORDER — AZITHROMYCIN 250 MG PO TABS: ORAL_TABLET | ORAL | 0 refills | Status: DC

## 2022-05-19 NOTE — Progress Notes (Signed)

## 2022-05-20 MED ORDER — PROMETHAZINE-DM 6.25-15 MG/5ML PO SYRP: 5.0000 mL | ORAL_SOLUTION | Freq: Three times a day (TID) | ORAL | 0 refills | Status: DC | PRN

## 2022-05-20 NOTE — Addendum Note (Signed)
Addended by: Evelina Dun A on: 05/20/2022 08:56 AM   Modules accepted: Orders

## 2022-05-28 ENCOUNTER — Other Ambulatory Visit: Payer: Self-pay | Admitting: Family

## 2022-05-30 ENCOUNTER — Telehealth: Payer: BC Managed Care – PPO | Admitting: Physician Assistant

## 2022-05-30 DIAGNOSIS — R051 Acute cough: Secondary | ICD-10-CM

## 2022-05-30 DIAGNOSIS — H66002 Acute suppurative otitis media without spontaneous rupture of ear drum, left ear: Secondary | ICD-10-CM

## 2022-05-30 MED ORDER — DOXYCYCLINE HYCLATE 100 MG PO TABS: 100.0000 mg | ORAL_TABLET | Freq: Two times a day (BID) | ORAL | 0 refills | Status: DC

## 2022-05-30 MED ORDER — PROMETHAZINE-DM 6.25-15 MG/5ML PO SYRP: 5.0000 mL | ORAL_SOLUTION | Freq: Three times a day (TID) | ORAL | 0 refills | Status: DC | PRN

## 2022-05-30 NOTE — Progress Notes (Signed)

## 2022-06-26 DIAGNOSIS — E1165 Type 2 diabetes mellitus with hyperglycemia: Secondary | ICD-10-CM | POA: Diagnosis not present

## 2022-06-26 DIAGNOSIS — E781 Pure hyperglyceridemia: Secondary | ICD-10-CM | POA: Diagnosis not present

## 2022-06-26 DIAGNOSIS — I10 Essential (primary) hypertension: Secondary | ICD-10-CM | POA: Diagnosis not present

## 2022-06-27 LAB — MICROALBUMIN / CREATININE URINE RATIO
Albumin/Creatinine Ratio, Urine, POC: 198
Creatinine, Urine.: 110.7
Microalb, Ur: 21.9

## 2022-07-11 ENCOUNTER — Other Ambulatory Visit (HOSPITAL_COMMUNITY): Payer: Self-pay

## 2022-07-12 ENCOUNTER — Other Ambulatory Visit (HOSPITAL_COMMUNITY): Payer: Self-pay

## 2022-07-20 ENCOUNTER — Other Ambulatory Visit (HOSPITAL_COMMUNITY): Payer: Self-pay

## 2022-07-31 ENCOUNTER — Other Ambulatory Visit (HOSPITAL_COMMUNITY): Payer: Self-pay

## 2022-07-31 ENCOUNTER — Telehealth: Payer: Self-pay

## 2022-07-31 NOTE — Telephone Encounter (Signed)
Pharmacy Patient Advocate Encounter   Received notification from [facility] that prior authorization for Vascepa 0.5gm is required/requested.  Per Test Claim: prior auth required   PA submitted on 07/31/22 to (ins) [employer] via [employer] [id number]  Status is APPROVED from 07/31/22 through 07/30/23

## 2022-09-25 DIAGNOSIS — E1165 Type 2 diabetes mellitus with hyperglycemia: Secondary | ICD-10-CM | POA: Diagnosis not present

## 2022-10-30 ENCOUNTER — Telehealth: Payer: BC Managed Care – PPO | Admitting: Family

## 2022-10-30 DIAGNOSIS — J069 Acute upper respiratory infection, unspecified: Secondary | ICD-10-CM

## 2022-10-30 MED ORDER — CETIRIZINE HCL 10 MG PO TABS: 10.0000 mg | ORAL_TABLET | Freq: Every day | ORAL | 1 refills | Status: DC

## 2022-10-30 MED ORDER — FLUTICASONE PROPIONATE 50 MCG/ACT NA SUSP: 2.0000 | Freq: Every day | NASAL | 6 refills | Status: DC

## 2022-10-30 MED ORDER — AMOXICILLIN-POT CLAVULANATE 875-125 MG PO TABS: 1.0000 | ORAL_TABLET | Freq: Two times a day (BID) | ORAL | 0 refills | Status: DC

## 2022-10-30 MED ORDER — PROMETHAZINE-DM 6.25-15 MG/5ML PO SYRP: 5.0000 mL | ORAL_SOLUTION | Freq: Three times a day (TID) | ORAL | 0 refills | Status: DC | PRN

## 2022-10-30 NOTE — Addendum Note (Signed)
Addended by: Jannifer Rodney A on: 10/30/2022 05:41 PM   Modules accepted: Orders

## 2022-10-30 NOTE — Progress Notes (Signed)

## 2022-12-13 DIAGNOSIS — D485 Neoplasm of uncertain behavior of skin: Secondary | ICD-10-CM | POA: Diagnosis not present

## 2022-12-13 DIAGNOSIS — C44722 Squamous cell carcinoma of skin of right lower limb, including hip: Secondary | ICD-10-CM | POA: Diagnosis not present

## 2022-12-13 DIAGNOSIS — L821 Other seborrheic keratosis: Secondary | ICD-10-CM | POA: Diagnosis not present

## 2022-12-16 ENCOUNTER — Other Ambulatory Visit: Payer: Self-pay | Admitting: Family Medicine

## 2022-12-16 DIAGNOSIS — M47816 Spondylosis without myelopathy or radiculopathy, lumbar region: Secondary | ICD-10-CM

## 2022-12-21 ENCOUNTER — Inpatient Hospital Stay (HOSPITAL_BASED_OUTPATIENT_CLINIC_OR_DEPARTMENT_OTHER)
Admission: EM | Admit: 2022-12-21 | Discharge: 2022-12-22 | DRG: 322 | Disposition: A | Discharge status: Home / Self Care | Payer: BC Managed Care – PPO | Attending: Cardiology | Admitting: Cardiology

## 2022-12-21 ENCOUNTER — Emergency Department (HOSPITAL_BASED_OUTPATIENT_CLINIC_OR_DEPARTMENT_OTHER): Payer: BC Managed Care – PPO

## 2022-12-21 ENCOUNTER — Encounter (HOSPITAL_COMMUNITY): Payer: Self-pay | Admitting: Cardiovascular Disease

## 2022-12-21 ENCOUNTER — Other Ambulatory Visit: Payer: Self-pay

## 2022-12-21 ENCOUNTER — Encounter (HOSPITAL_COMMUNITY)
Admission: EM | Disposition: A | Discharge status: Home / Self Care | Payer: Self-pay | Source: Home / Self Care | Attending: Cardiovascular Disease

## 2022-12-21 ENCOUNTER — Inpatient Hospital Stay (HOSPITAL_COMMUNITY): Payer: BC Managed Care – PPO

## 2022-12-21 ENCOUNTER — Other Ambulatory Visit (HOSPITAL_COMMUNITY): Payer: Self-pay

## 2022-12-21 DIAGNOSIS — Z888 Allergy status to other drugs, medicaments and biological substances status: Secondary | ICD-10-CM

## 2022-12-21 DIAGNOSIS — Z7984 Long term (current) use of oral hypoglycemic drugs: Secondary | ICD-10-CM

## 2022-12-21 DIAGNOSIS — Z8249 Family history of ischemic heart disease and other diseases of the circulatory system: Secondary | ICD-10-CM

## 2022-12-21 DIAGNOSIS — Z9079 Acquired absence of other genital organ(s): Secondary | ICD-10-CM | POA: Diagnosis not present

## 2022-12-21 DIAGNOSIS — Z716 Tobacco abuse counseling: Secondary | ICD-10-CM | POA: Diagnosis not present

## 2022-12-21 DIAGNOSIS — E1169 Type 2 diabetes mellitus with other specified complication: Secondary | ICD-10-CM | POA: Diagnosis present

## 2022-12-21 DIAGNOSIS — I2119 ST elevation (STEMI) myocardial infarction involving other coronary artery of inferior wall: Principal | ICD-10-CM

## 2022-12-21 DIAGNOSIS — Z90721 Acquired absence of ovaries, unilateral: Secondary | ICD-10-CM

## 2022-12-21 DIAGNOSIS — Z6834 Body mass index (BMI) 34.0-34.9, adult: Secondary | ICD-10-CM | POA: Diagnosis not present

## 2022-12-21 DIAGNOSIS — I213 ST elevation (STEMI) myocardial infarction of unspecified site: Secondary | ICD-10-CM

## 2022-12-21 DIAGNOSIS — R0902 Hypoxemia: Secondary | ICD-10-CM | POA: Diagnosis not present

## 2022-12-21 DIAGNOSIS — Z1152 Encounter for screening for COVID-19: Secondary | ICD-10-CM | POA: Diagnosis not present

## 2022-12-21 DIAGNOSIS — R079 Chest pain, unspecified: Secondary | ICD-10-CM | POA: Diagnosis not present

## 2022-12-21 DIAGNOSIS — Z833 Family history of diabetes mellitus: Secondary | ICD-10-CM

## 2022-12-21 DIAGNOSIS — Z79899 Other long term (current) drug therapy: Secondary | ICD-10-CM | POA: Diagnosis not present

## 2022-12-21 DIAGNOSIS — E669 Obesity, unspecified: Secondary | ICD-10-CM | POA: Diagnosis present

## 2022-12-21 DIAGNOSIS — E119 Type 2 diabetes mellitus without complications: Secondary | ICD-10-CM

## 2022-12-21 DIAGNOSIS — R000 Tachycardia, unspecified: Secondary | ICD-10-CM | POA: Diagnosis not present

## 2022-12-21 DIAGNOSIS — F17210 Nicotine dependence, cigarettes, uncomplicated: Secondary | ICD-10-CM | POA: Diagnosis present

## 2022-12-21 DIAGNOSIS — Z794 Long term (current) use of insulin: Secondary | ICD-10-CM | POA: Diagnosis not present

## 2022-12-21 DIAGNOSIS — M1990 Unspecified osteoarthritis, unspecified site: Secondary | ICD-10-CM | POA: Diagnosis not present

## 2022-12-21 DIAGNOSIS — Z955 Presence of coronary angioplasty implant and graft: Secondary | ICD-10-CM

## 2022-12-21 DIAGNOSIS — Z820 Family history of epilepsy and other diseases of the nervous system: Secondary | ICD-10-CM | POA: Diagnosis not present

## 2022-12-21 DIAGNOSIS — E782 Mixed hyperlipidemia: Secondary | ICD-10-CM | POA: Diagnosis present

## 2022-12-21 DIAGNOSIS — R9431 Abnormal electrocardiogram [ECG] [EKG]: Secondary | ICD-10-CM | POA: Diagnosis not present

## 2022-12-21 DIAGNOSIS — E1165 Type 2 diabetes mellitus with hyperglycemia: Secondary | ICD-10-CM | POA: Diagnosis not present

## 2022-12-21 DIAGNOSIS — I10 Essential (primary) hypertension: Secondary | ICD-10-CM | POA: Diagnosis present

## 2022-12-21 DIAGNOSIS — I2111 ST elevation (STEMI) myocardial infarction involving right coronary artery: Secondary | ICD-10-CM | POA: Diagnosis not present

## 2022-12-21 DIAGNOSIS — I2510 Atherosclerotic heart disease of native coronary artery without angina pectoris: Secondary | ICD-10-CM | POA: Diagnosis present

## 2022-12-21 DIAGNOSIS — R059 Cough, unspecified: Secondary | ICD-10-CM | POA: Diagnosis not present

## 2022-12-21 DIAGNOSIS — Z720 Tobacco use: Secondary | ICD-10-CM | POA: Diagnosis present

## 2022-12-21 HISTORY — PX: CORONARY/GRAFT ACUTE MI REVASCULARIZATION: CATH118305

## 2022-12-21 HISTORY — PX: LEFT HEART CATH AND CORONARY ANGIOGRAPHY: CATH118249

## 2022-12-21 HISTORY — DX: ST elevation (STEMI) myocardial infarction involving other coronary artery of inferior wall: I21.19

## 2022-12-21 LAB — BASIC METABOLIC PANEL
Anion gap: 10 (ref 5–15)
Anion gap: 10 (ref 5–15)
BUN: 16 mg/dL (ref 6–20)
BUN: 14 mg/dL (ref 6–20)
CO2: 22 mmol/L (ref 22–32)
CO2: 22 mmol/L (ref 22–32)
Calcium: 8.8 mg/dL — ABNORMAL LOW (ref 8.9–10.3)
Calcium: 8.9 mg/dL (ref 8.9–10.3)
Chloride: 99 mmol/L (ref 98–111)
Chloride: 102 mmol/L (ref 98–111)
Creatinine, Ser: 0.83 mg/dL (ref 0.44–1.00)
Creatinine, Ser: 0.85 mg/dL (ref 0.44–1.00)
GFR, Estimated: >60 mL/min (ref >60)
GFR, Estimated: >60 mL/min (ref >60)
Glucose, Bld: 346 mg/dL — ABNORMAL HIGH (ref 70–99)
Glucose, Bld: 197 mg/dL — ABNORMAL HIGH (ref 70–99)
Potassium: 3.9 mmol/L (ref 3.5–5.1)
Potassium: 3.3 mmol/L — ABNORMAL LOW (ref 3.5–5.1)
Sodium: 131 mmol/L — ABNORMAL LOW (ref 135–145)
Sodium: 134 mmol/L — ABNORMAL LOW (ref 135–145)

## 2022-12-21 LAB — POCT ACTIVATED CLOTTING TIME
Activated Clotting Time: 220 seconds
Activated Clotting Time: 293 seconds

## 2022-12-21 LAB — RESP PANEL BY RT-PCR (RSV, FLU A&B, COVID)  RVPGX2
Influenza A by PCR: NEGATIVE
Influenza B by PCR: NEGATIVE
Resp Syncytial Virus by PCR: NEGATIVE
SARS Coronavirus 2 by RT PCR: NEGATIVE

## 2022-12-21 LAB — CBC WITH DIFFERENTIAL/PLATELET
Abs Immature Granulocytes: 0.04 10*3/uL (ref 0.00–0.07)
Basophils Absolute: 0.1 10*3/uL (ref 0.0–0.1)
Basophils Relative: 1 %
Eosinophils Absolute: 0.1 10*3/uL (ref 0.0–0.5)
Eosinophils Relative: 1 %
HCT: 44.4 % (ref 36.0–46.0)
Hemoglobin: 14.8 g/dL (ref 12.0–15.0)
Immature Granulocytes: 0 %
Lymphocytes Relative: 13 %
Lymphs Abs: 1.2 10*3/uL (ref 0.7–4.0)
MCH: 29.3 pg (ref 26.0–34.0)
MCHC: 33.3 g/dL (ref 30.0–36.0)
MCV: 87.9 fL (ref 80.0–100.0)
Monocytes Absolute: 0.5 10*3/uL (ref 0.1–1.0)
Monocytes Relative: 5 %
Neutro Abs: 7.8 10*3/uL — ABNORMAL HIGH (ref 1.7–7.7)
Neutrophils Relative %: 80 %
Platelets: 334 10*3/uL (ref 150–400)
RBC: 5.05 MIL/uL (ref 3.87–5.11)
RDW: 15.1 % (ref 11.5–15.5)
WBC: 9.8 10*3/uL (ref 4.0–10.5)
nRBC: 0 % (ref 0.0–0.2)

## 2022-12-21 LAB — TROPONIN I (HIGH SENSITIVITY)
Troponin I (High Sensitivity): 659 ng/L (ref <18)
Troponin I (High Sensitivity): 1683 ng/L (ref <18)

## 2022-12-21 LAB — ECHOCARDIOGRAM COMPLETE
Area-P 1/2: 3.48 cm2
Height: 65 in
S' Lateral: 3 cm
Weight: 3360 oz

## 2022-12-21 LAB — GLUCOSE, CAPILLARY
Glucose-Capillary: 369 mg/dL — ABNORMAL HIGH (ref 70–99)
Glucose-Capillary: 261 mg/dL — ABNORMAL HIGH (ref 70–99)
Glucose-Capillary: 187 mg/dL — ABNORMAL HIGH (ref 70–99)
Glucose-Capillary: 200 mg/dL — ABNORMAL HIGH (ref 70–99)
Glucose-Capillary: 184 mg/dL — ABNORMAL HIGH (ref 70–99)

## 2022-12-21 SURGERY — CORONARY/GRAFT ACUTE MI REVASCULARIZATION
Anesthesia: LOCAL

## 2022-12-21 MED ORDER — SODIUM CHLORIDE 0.9% FLUSH
3.0000 mL | INTRAVENOUS | Status: DC | PRN
  Administered 2022-12-21: 3 mL via INTRAVENOUS

## 2022-12-21 MED ORDER — TICAGRELOR 90 MG PO TABS
ORAL_TABLET | ORAL | Status: AC
  Filled 2022-12-21: qty 2

## 2022-12-21 MED ORDER — SODIUM CHLORIDE 0.9 % IV SOLN: 250.0000 mL | INTRAVENOUS | Status: DC | PRN

## 2022-12-21 MED ORDER — HEPARIN SODIUM (PORCINE) 5000 UNIT/ML IJ SOLN
5000.0000 [IU] | Freq: Three times a day (TID) | INTRAMUSCULAR | Status: DC
  Administered 2022-12-21 – 2022-12-22 (×3): 5000 [IU] via SUBCUTANEOUS
  Filled 2022-12-21 (×3): qty 1

## 2022-12-21 MED ORDER — OXYCODONE HCL 5 MG PO TABS: 5.0000 mg | ORAL_TABLET | ORAL | Status: DC | PRN

## 2022-12-21 MED ORDER — ONDANSETRON HCL 4 MG/2ML IJ SOLN
4.0000 mg | Freq: Once | INTRAMUSCULAR | Status: AC
  Administered 2022-12-21: 4 mg via INTRAVENOUS
  Filled 2022-12-21: qty 2

## 2022-12-21 MED ORDER — INSULIN ASPART 100 UNIT/ML IJ SOLN: 0.0000 [IU] | Freq: Every day | INTRAMUSCULAR | Status: DC

## 2022-12-21 MED ORDER — VERAPAMIL HCL 2.5 MG/ML IV SOLN
INTRAVENOUS | Status: DC | PRN
  Administered 2022-12-21: 10 mL via INTRA_ARTERIAL

## 2022-12-21 MED ORDER — LIDOCAINE HCL (PF) 1 % IJ SOLN
INTRAMUSCULAR | Status: DC | PRN
  Administered 2022-12-21: 2 mL

## 2022-12-21 MED ORDER — ASPIRIN 81 MG PO CHEW
324.0000 mg | CHEWABLE_TABLET | Freq: Once | ORAL | Status: AC
  Filled 2022-12-21: qty 4

## 2022-12-21 MED ORDER — FENTANYL CITRATE PF 50 MCG/ML IJ SOSY
50.0000 ug | PREFILLED_SYRINGE | Freq: Once | INTRAMUSCULAR | Status: AC
  Administered 2022-12-21: 50 ug via INTRAVENOUS
  Filled 2022-12-21: qty 1

## 2022-12-21 MED ORDER — DIAZEPAM 5 MG PO TABS: 5.0000 mg | ORAL_TABLET | Freq: Three times a day (TID) | ORAL | Status: DC | PRN

## 2022-12-21 MED ORDER — TIROFIBAN (AGGRASTAT) BOLUS VIA INFUSION
INTRAVENOUS | Status: DC | PRN
  Administered 2022-12-21: 2382.5 ug via INTRAVENOUS

## 2022-12-21 MED ORDER — INSULIN DEGLUDEC 200 UNIT/ML ~~LOC~~ SOPN: PEN_INJECTOR | Freq: Every day | SUBCUTANEOUS | Status: DC

## 2022-12-21 MED ORDER — HYDRALAZINE HCL 20 MG/ML IJ SOLN: 10.0000 mg | INTRAMUSCULAR | Status: AC | PRN

## 2022-12-21 MED ORDER — NITROGLYCERIN 0.4 MG SL SUBL
SUBLINGUAL_TABLET | SUBLINGUAL | Status: AC
  Filled 2022-12-21: qty 1

## 2022-12-21 MED ORDER — IOHEXOL 350 MG/ML SOLN
INTRAVENOUS | Status: DC | PRN
  Administered 2022-12-21: 75 mL

## 2022-12-21 MED ORDER — ONDANSETRON HCL 4 MG/2ML IJ SOLN: 4.0000 mg | Freq: Four times a day (QID) | INTRAMUSCULAR | Status: DC | PRN

## 2022-12-21 MED ORDER — HEPARIN SODIUM (PORCINE) 1000 UNIT/ML IJ SOLN
INTRAMUSCULAR | Status: AC
  Filled 2022-12-21: qty 10

## 2022-12-21 MED ORDER — GLIMEPIRIDE 2 MG PO TABS
2.0000 mg | ORAL_TABLET | Freq: Every day | ORAL | Status: DC
  Administered 2022-12-21 – 2022-12-22 (×2): 2 mg via ORAL
  Filled 2022-12-21 (×2): qty 1

## 2022-12-21 MED ORDER — ASPIRIN 81 MG PO CHEW
81.0000 mg | CHEWABLE_TABLET | Freq: Every day | ORAL | Status: DC
  Administered 2022-12-22: 81 mg via ORAL
  Filled 2022-12-21: qty 1

## 2022-12-21 MED ORDER — HEPARIN SODIUM (PORCINE) 5000 UNIT/ML IJ SOLN: 4000.0000 [IU] | Freq: Once | INTRAMUSCULAR | Status: DC

## 2022-12-21 MED ORDER — NITROGLYCERIN 1 MG/10 ML FOR IR/CATH LAB
INTRA_ARTERIAL | Status: DC | PRN
  Administered 2022-12-21: 150 ug

## 2022-12-21 MED ORDER — MORPHINE SULFATE (PF) 2 MG/ML IV SOLN: 2.0000 mg | INTRAVENOUS | Status: DC | PRN

## 2022-12-21 MED ORDER — ASPIRIN 81 MG PO CHEW
CHEWABLE_TABLET | ORAL | Status: AC
  Administered 2022-12-21: 324 mg via ORAL
  Filled 2022-12-21: qty 1

## 2022-12-21 MED ORDER — MIDAZOLAM HCL 2 MG/2ML IJ SOLN
INTRAMUSCULAR | Status: AC
  Filled 2022-12-21: qty 2

## 2022-12-21 MED ORDER — GLUCAGON 3 MG/DOSE NA POWD: 1.0000 | NASAL | Status: DC | PRN

## 2022-12-21 MED ORDER — INSULIN ASPART 100 UNIT/ML IJ SOLN: 13.0000 [IU] | Freq: Three times a day (TID) | INTRAMUSCULAR | Status: DC

## 2022-12-21 MED ORDER — SODIUM CHLORIDE 0.9% FLUSH
3.0000 mL | Freq: Two times a day (BID) | INTRAVENOUS | Status: DC
  Administered 2022-12-21 – 2022-12-22 (×3): 3 mL via INTRAVENOUS

## 2022-12-21 MED ORDER — POTASSIUM CHLORIDE 10 MEQ/100ML IV SOLN
10.0000 meq | INTRAVENOUS | Status: AC
  Administered 2022-12-21 (×2): 10 meq via INTRAVENOUS
  Filled 2022-12-21 (×2): qty 100

## 2022-12-21 MED ORDER — LIDOCAINE HCL (PF) 1 % IJ SOLN
INTRAMUSCULAR | Status: AC
  Filled 2022-12-21: qty 30

## 2022-12-21 MED ORDER — ACETAMINOPHEN 325 MG PO TABS: 650.0000 mg | ORAL_TABLET | ORAL | Status: DC | PRN

## 2022-12-21 MED ORDER — ONDANSETRON 4 MG PO TBDP: 8.0000 mg | ORAL_TABLET | Freq: Once | ORAL | Status: DC

## 2022-12-21 MED ORDER — TICAGRELOR 90 MG PO TABS
ORAL_TABLET | ORAL | Status: DC | PRN
  Administered 2022-12-21: 180 mg via ORAL

## 2022-12-21 MED ORDER — FENTANYL CITRATE (PF) 100 MCG/2ML IJ SOLN
INTRAMUSCULAR | Status: AC
  Filled 2022-12-21: qty 2

## 2022-12-21 MED ORDER — HEPARIN BOLUS VIA INFUSION: 60.0000 [IU]/kg | Freq: Once | INTRAVENOUS | Status: DC

## 2022-12-21 MED ORDER — CHLORHEXIDINE GLUCONATE CLOTH 2 % EX PADS: 6.0000 | MEDICATED_PAD | Freq: Every day | CUTANEOUS | Status: DC

## 2022-12-21 MED ORDER — HEPARIN SODIUM (PORCINE) 1000 UNIT/ML IJ SOLN
INTRAMUSCULAR | Status: DC | PRN
  Administered 2022-12-21: 6000 [IU] via INTRAVENOUS
  Administered 2022-12-21: 3000 [IU] via INTRAVENOUS

## 2022-12-21 MED ORDER — MIDAZOLAM HCL 2 MG/2ML IJ SOLN
INTRAMUSCULAR | Status: DC | PRN
  Administered 2022-12-21: 2 mg via INTRAVENOUS
  Administered 2022-12-21: 1 mg via INTRAVENOUS

## 2022-12-21 MED ORDER — LABETALOL HCL 5 MG/ML IV SOLN: 10.0000 mg | INTRAVENOUS | Status: AC | PRN

## 2022-12-21 MED ORDER — ROSUVASTATIN CALCIUM 20 MG PO TABS
40.0000 mg | ORAL_TABLET | Freq: Every day | ORAL | Status: DC
  Administered 2022-12-21: 40 mg via ORAL
  Filled 2022-12-21 (×2): qty 2

## 2022-12-21 MED ORDER — ALUM & MAG HYDROXIDE-SIMETH 200-200-20 MG/5ML PO SUSP: 30.0000 mL | Freq: Once | ORAL | Status: DC

## 2022-12-21 MED ORDER — TIROFIBAN HCL IN NACL 5-0.9 MG/100ML-% IV SOLN
INTRAVENOUS | Status: AC
  Filled 2022-12-21: qty 100

## 2022-12-21 MED ORDER — INSULIN GLARGINE-YFGN 100 UNIT/ML ~~LOC~~ SOLN
25.0000 [IU] | Freq: Two times a day (BID) | SUBCUTANEOUS | Status: DC
  Administered 2022-12-21 – 2022-12-22 (×3): 25 [IU] via SUBCUTANEOUS
  Filled 2022-12-21 (×4): qty 0.25

## 2022-12-21 MED ORDER — VERAPAMIL HCL 2.5 MG/ML IV SOLN
INTRAVENOUS | Status: AC
  Filled 2022-12-21: qty 2

## 2022-12-21 MED ORDER — INSULIN ASPART 100 UNIT/ML IJ SOLN
0.0000 [IU] | Freq: Three times a day (TID) | INTRAMUSCULAR | Status: DC
  Administered 2022-12-21: 4 [IU] via SUBCUTANEOUS
  Administered 2022-12-21: 11 [IU] via SUBCUTANEOUS
  Administered 2022-12-22: 4 [IU] via SUBCUTANEOUS
  Administered 2022-12-22: 3 [IU] via SUBCUTANEOUS

## 2022-12-21 MED ORDER — HEPARIN SODIUM (PORCINE) 5000 UNIT/ML IJ SOLN
INTRAMUSCULAR | Status: AC
  Administered 2022-12-21: 5000 [IU]
  Filled 2022-12-21: qty 1

## 2022-12-21 MED ORDER — SODIUM CHLORIDE 0.9 % IV SOLN: INTRAVENOUS | Status: AC

## 2022-12-21 MED ORDER — HEPARIN (PORCINE) IN NACL 1000-0.9 UT/500ML-% IV SOLN
INTRAVENOUS | Status: DC | PRN
  Administered 2022-12-21 (×2): 500 mL

## 2022-12-21 MED ORDER — TIROFIBAN HCL IN NACL 5-0.9 MG/100ML-% IV SOLN
INTRAVENOUS | Status: DC | PRN
  Administered 2022-12-21: .15 ug/kg/min via INTRAVENOUS

## 2022-12-21 MED ORDER — INSULIN ASPART 100 UNIT/ML IJ SOLN
8.0000 [IU] | Freq: Three times a day (TID) | INTRAMUSCULAR | Status: DC
  Administered 2022-12-21 – 2022-12-22 (×4): 8 [IU] via SUBCUTANEOUS

## 2022-12-21 MED ORDER — SODIUM CHLORIDE 0.9 % IV SOLN
INTRAVENOUS | Status: AC | PRN
  Administered 2022-12-21: 100 mL/h via INTRAVENOUS

## 2022-12-21 MED ORDER — TICAGRELOR 90 MG PO TABS
90.0000 mg | ORAL_TABLET | Freq: Two times a day (BID) | ORAL | Status: DC
  Administered 2022-12-21 – 2022-12-22 (×2): 90 mg via ORAL
  Filled 2022-12-21 (×2): qty 1

## 2022-12-21 MED ORDER — FENTANYL CITRATE (PF) 100 MCG/2ML IJ SOLN
INTRAMUSCULAR | Status: DC | PRN
  Administered 2022-12-21: 25 ug via INTRAVENOUS
  Administered 2022-12-21: 50 ug via INTRAVENOUS
  Administered 2022-12-21: 25 ug via INTRAVENOUS

## 2022-12-21 SURGICAL SUPPLY — 22 items
BALL SAPPHIRE NC24 4.5X18 (BALLOONS) ×1
BALLN EMERGE MR 3.0X20 (BALLOONS) ×1
BALLOON EMERGE MR 3.0X20 (BALLOONS) IMPLANT
BALLOON SAPPHIRE NC24 4.5X18 (BALLOONS) IMPLANT
BAND CMPR LRG ZPHR (HEMOSTASIS) ×1
BAND ZEPHYR COMPRESS 30 LONG (HEMOSTASIS) IMPLANT
CATH 5FR JL3.5 JR4 ANG PIG MP (CATHETERS) IMPLANT
CATH VISTA GUIDE 6FR JR4 (CATHETERS) IMPLANT
ELECT DEFIB PAD ADLT CADENCE (PAD) IMPLANT
GLIDESHEATH SLEND SS 6F .021 (SHEATH) IMPLANT
GUIDEWIRE INQWIRE 1.5J.035X260 (WIRE) IMPLANT
INQWIRE 1.5J .035X260CM (WIRE) ×1
KIT ENCORE 26 ADVANTAGE (KITS) IMPLANT
KIT HEART LEFT (KITS) ×1 IMPLANT
KIT HEMO VALVE WATCHDOG (MISCELLANEOUS) IMPLANT
PACK CARDIAC CATHETERIZATION (CUSTOM PROCEDURE TRAY) ×1 IMPLANT
SHEATH PROBE COVER 6X72 (BAG) IMPLANT
STENT SYNERGY XD 4.0X28 (Permanent Stent) IMPLANT
SYNERGY XD 4.0X28 (Permanent Stent) ×1 IMPLANT
TRANSDUCER W/STOPCOCK (MISCELLANEOUS) ×1 IMPLANT
TUBING CIL FLEX 10 FLL-RA (TUBING) ×1 IMPLANT
WIRE RUNTHROUGH .014X180CM (WIRE) IMPLANT

## 2022-12-21 NOTE — ED Triage Notes (Signed)
Pt has had a cold for 2 weeks, cough, congestion, and burning in the mid chest. Tonight she vomited.

## 2022-12-21 NOTE — H&P (Signed)
Cardiology Admission History and Physical   Patient ID: Teresa Parks MRN: [id number]; DOB: 05-06-1971   Admission date: 12/21/2022  PCP:  Pcp, No   [facility] Providers Cardiologist:  None        Chief Complaint:  Chest pain  Patient Profile:   Teresa Parks is a 52 y.o. female with diabetes and tobacco abuse who is being seen 12/21/2022 for the evaluation of inferior STEMI.  History of Present Illness:   Teresa Parks is a 52 year old type II diabetic presenting with chest discomfort that started at 10:30 PM last night.  States she had a difficult time getting comfortable and had continuous pain, prompting her to go to med [facility] emergency department this morning.  She described a pain in the center of her chest that felt like acid reflux, described as burning and pressure-like.  States the pain was never severe.  There is no associated shortness of breath, diaphoresis, lightheadedness, or syncope.  She did experience nausea and cough associated with her symptoms.  She has never had symptoms like this before.  She has otherwise been in her normal state of health and denies any symptoms of cold or flu recently.  She has had no recent surgery over the last 3 months.  She denies bleeding problems.  Denies any history of stroke or TIA.  Her EKG at [facility] was consistent with an inferior STEMI and a code STEMI was paged with the patient transported directly to [facility] for cardiac catheterization.  I met her in the emergency department and brought her straight up to the cardiac Cath Lab.  At the time of my evaluation, the patient is currently chest pain-free.   Past Medical History:  Diagnosis Date   Arthritis    Bell's palsy    hx of 15 years ago approx   Carpal tunnel syndrome    Chronic ear infection    Chronic pancreatitis ([facility])    Diabetes ([facility])    Diabetes mellitus type II, controlled ([facility])    Hemorrhoids    HTN (hypertension)    pt reports  b/p runs high when goes to MD office   Hyperlipidemia    Obesity    Pancreatitis, acute     Past Surgical History:  Procedure Laterality Date   DILATATION & CURETTAGE/HYSTEROSCOPY WITH MYOSURE N/A 04/18/2022   Procedure: DILATATION & CURETTAGE/HYSTEROSCOPY WITH MYOSURE;  Surgeon: Carver Fila, MD;  Location: [facility];  Service: Gynecology;  Laterality: N/A;   KNEE ARTHROSCOPY Right    UMBILICAL HERNIA REPAIR     WISDOM TOOTH EXTRACTION     XI ROBOTIC ASSISTED OOPHORECTOMY N/A 04/18/2022   Procedure: XI ROBOTIC ASSISTED BILATERAL  SALPINGECTOMY LEFT OOPHORECTOMY;  Surgeon: Carver Fila, MD;  Location: [facility];  Service: Gynecology;  Laterality: N/A;     Medications Prior to Admission: Prior to Admission medications   Medication Sig Start Date End Date Taking? Authorizing Provider  amLODipine-olmesartan (AZOR) 10-40 MG tablet Take 1 tablet by mouth daily. Patient taking differently: Take 1 tablet by mouth daily in the afternoon. Pt takes in the pm 08/17/21   Janeece Agee, NP  amoxicillin-clavulanate (AUGMENTIN) 875-125 MG tablet Take 1 tablet by mouth 2 (two) times daily. 10/30/22   Junie Spencer, FNP  cetirizine (ZYRTEC ALLERGY) 10 MG tablet Take 1 tablet (10 mg total) by mouth daily. 10/30/22   Junie Spencer, FNP  cyclobenzaprine (FLEXERIL) 10 MG tablet Take 1 tablet (10 mg  total) by mouth at bedtime. Patient taking differently: Take 10 mg by mouth daily as needed for muscle spasms. 08/17/21   Janeece Agee, NP  doxycycline (VIBRA-TABS) 100 MG tablet Take 1 tablet (100 mg total) by mouth 2 (two) times daily. 05/30/22   Margaretann Loveless, PA-C  fenofibrate 160 MG tablet Take 1 tablet (160 mg total) by mouth daily. Patient taking differently: Take 160 mg by mouth daily in the afternoon. 08/17/21   Janeece Agee, NP  fluticasone (FLONASE) 50 MCG/ACT nasal spray Place 2 sprays into both nostrils daily. 10/30/22   Jannifer Rodney A, FNP  glimepiride (AMARYL) 2 MG tablet Take 2  mg by mouth every morning. 03/21/22   [provider]  Glucagon (BAQSIMI ONE PACK) 3 MG/DOSE POWD Place 1 Dose into the nose as needed (low blood sugar).    [provider]  glucose blood test strip Use as instructed 02/08/14   Viyuoh, Adeline C, MD  Icosapent Ethyl (VASCEPA) 0.5 g CAPS Take 4 capsules by mouth daily. Patient taking differently: Take 2 capsules by mouth daily. Pt takes in the pm 08/17/21   Janeece Agee, NP  insulin aspart (NOVOLOG) 100 UNIT/ML injection Inject 13-22 Units into the skin 3 (three) times daily before meals. PEr pt she takes Novolog Sliding Scale plus 10 units Novolog with each meal  100-150-3 units      greater than 300-12 units  150-200-5 units 200-250-8 units  251-300-10 units    [provider]  insulin degludec (TRESIBA FLEXTOUCH) 200 UNIT/ML FlexTouch Pen Inject 56 Units into the skin daily. And pen needles 1/day Patient taking differently: Inject 50-58 Units into the skin See admin instructions. Inject 58 units in the morning and 50 units in the evening 08/17/21   Janeece Agee, NP  Insulin Pen Needle 31G X 8 MM MISC 1 each by Does not apply route daily. Use to inject insulin daily; E11.65 04/27/19   Romero Belling, MD  JANUVIA 50 MG tablet Take by mouth daily in the afternoon. 03/26/22   [provider]  Lancets (FREESTYLE) lancets Use as instructed 02/08/14   Viyuoh, Adeline C, MD  naproxen sodium (ALEVE) 220 MG tablet Take 220-440 mg by mouth daily as needed (pain).    [provider]  promethazine-dextromethorphan (PROMETHAZINE-DM) 6.25-15 MG/5ML syrup Take 5 mLs by mouth 3 (three) times daily as needed for cough. 10/30/22   Junie Spencer, FNP     Allergies:    Allergies  Allergen Reactions   Novocain [Procaine] Nausea And Vomiting    Social History:   Social History   Socioeconomic History   Marital status: Married    Spouse name: Not on file   Number of children: 1   Years of education: Not on file    Highest education level: Not on file  Occupational History   Occupation: Radio broadcast assistant   Occupation: works remotely as an Airline pilot  Tobacco Use   Smoking status: Every Day    Packs/day: 1.00    Years: 35.00    Additional pack years: 0.00    Total pack years: 35.00    Types: Cigarettes    Start date: 1988   Smokeless tobacco: Never  Vaping Use   Vaping Use: Never used  Substance and Sexual Activity   Alcohol use: No    Alcohol/week: 0.0 standard drinks of alcohol   Drug use: No   Sexual activity: Yes    Birth control/protection: None  Other Topics Concern   Not on file  Social History Narrative   Not on file   Social Determinants of Health   Financial Resource Strain: Not on file  Food Insecurity: Not on file  Transportation Needs: Not on file  Physical Activity: Not on file  Stress: Not on file  Social Connections: Not on file  Intimate Partner Violence: Not on file    Family History:   The patient's family history includes Alzheimer's disease in her maternal grandfather; Diabetes in her father, mother, and paternal uncle; Healthy in her brother and daughter; Heart attack (age of onset: 12) in her father; Hypertension in her mother. There is no history of Stomach cancer, Colon cancer, Esophageal cancer, Breast cancer, Ovarian cancer, Endometrial cancer, Pancreatic cancer, or Prostate cancer.    ROS:  Please see the history of present illness.  All other ROS reviewed and negative.     Physical Exam/Data:   Vitals:   12/21/22 0406 12/21/22 0408  BP: (!) 148/76   Pulse: (!) 102   Resp: 18   Temp: 97.9 F (36.6 C)   TempSrc: Oral   SpO2: 96%   Weight:  95.3 kg  Height:  5\' 5"  (1.651 m)   No intake or output data in the 24 hours ending 12/21/22 0525    12/21/2022    4:08 AM 05/11/2022    2:25 PM 04/18/2022   10:56 AM  Last 3 Weights  Weight (lbs) 210 lb 212 lb 204 lb  Weight (kg) 95.255 kg 96.163 kg 92.534 kg     Body mass index is 34.95 kg/m.   General:  Well nourished, well developed, in no acute distress HEENT: normal Neck: no JVD Vascular: No carotid bruits; Distal pulses 2+ bilaterally   Cardiac:  normal S1, S2; RRR; no murmur  Lungs:  clear to auscultation bilaterally, no wheezing, rhonchi or rales  Abd: soft, nontender, no hepatomegaly  Ext: no edema Musculoskeletal:  No deformities, BUE and BLE strength normal and equal Skin: warm and dry  Neuro:  CNs 2-12 intact, no focal abnormalities noted Psych:  Normal affect    EKG:  The ECG that was done was personally reviewed and demonstrates normal sinus rhythm 99 bpm with acute inferior STEMI pattern  Relevant CV Studies:    pending  Laboratory Data:  High Sensitivity Troponin:   Recent Labs  Lab 12/21/22 0421  TROPONINIHS 659*      Chemistry Recent Labs  Lab 12/21/22 0421  NA 131*  K 3.9  CL 99  CO2 22  GLUCOSE 346*  BUN 16  CREATININE 0.83  CALCIUM 8.8*  GFRNONAA >60  ANIONGAP 10    No results for input(s): "PROT", "ALBUMIN", "AST", "ALT", "ALKPHOS", "BILITOT" in the last 168 hours. Lipids No results for input(s): "CHOL", "TRIG", "HDL", "LABVLDL", "LDLCALC", "CHOLHDL" in the last 168 hours. Hematology Recent Labs  Lab 12/21/22 0421  WBC 9.8  RBC 5.05  HGB 14.8  HCT 44.4  MCV 87.9  MCH 29.3  MCHC 33.3  RDW 15.1  PLT 334   Thyroid No results for input(s): "TSH", "FREET4" in the last 168 hours. BNPNo results for input(s): "BNP", "PROBNP" in the last 168 hours.  DDimer No results for input(s): "DDIMER" in the last 168 hours.   Radiology/Studies:  DG Chest Portable 1 View  Result Date: 12/21/2022 CLINICAL DATA:  Cough, chest pain. EXAM: PORTABLE CHEST 1 VIEW COMPARISON:  None Available. FINDINGS: The heart size and mediastinal contours are within normal limits. Both lungs are clear. No acute osseous abnormality. IMPRESSION: No active  disease. Electronically Signed   By: Thornell Sartorius M.D.   On: 12/21/2022 04:27     Assessment and Plan:    Acute inferior STEMI Type II DM, insulin requiring HTN Tobacco abuse  Plan: Patient has received aspirin and IV heparin per protocol.  We will proceed emergently with cardiac catheterization and likely primary PCI.  I explained the procedural indications, risks, and alternatives to the patient.  Further plans/disposition pending cardiac catheterization results.  For her diabetes, she will be restarted on insulin with sliding scale coverage.  Will check hemoglobin A1c.  For her hypertension, will adjust her antihypertensive program pending findings of LVEF and appropriate post MI medical therapy.  Tobacco cessation counseling will be done.  The patient currently smokes 1 pack/day.   Risk Assessment/Risk Scores:    TIMI Risk Score for ST  Elevation MI:   The patient's TIMI risk score is 4, which indicates a 7.3% risk of all cause mortality at 30 days.        Severity of Illness: The appropriate patient status for this patient is INPATIENT. Inpatient status is judged to be reasonable and necessary in order to provide the required intensity of service to ensure the patient's safety. The patient's presenting symptoms, physical exam findings, and initial radiographic and laboratory data in the context of their chronic comorbidities is felt to place them at high risk for further clinical deterioration. Furthermore, it is not anticipated that the patient will be medically stable for discharge from the hospital within 2 midnights of admission.   * I certify that at the point of admission it is my clinical judgment that the patient will require inpatient hospital care spanning beyond 2 midnights from the point of admission due to high intensity of service, high risk for further deterioration and high frequency of surveillance required.*   For questions or updates, please contact [facility] Please consult [email or web address] for contact info under     Signed, Tonny Bollman, MD   12/21/2022 5:25 AM

## 2022-12-21 NOTE — Progress Notes (Signed)
CARDIAC REHAB PHASE I   PRE:  Rate/Rhythm: 82 SR  BP:  Sitting:117/63     SaO2: 90 RA  MODE:  Ambulation: 150 ft   POST:  Rate/Rhythm: 110 ST  BP:  Sitting: 126/73      SaO2: 93 RA  Pt ambulated independently in hall. Tolerated well, moving at quick steady pace, with no CP,SOB or dizziness. Post MI/stent education including site care, risk factors, restrictions, MI booklet, antiplatelet therapy importance, exercise guidelines, NTG use, heart healthy diet, smoking cessation and CRP2 reviewed. All questions and concerns addressed.  Will refer to [facility] for CRP2. Will continue to follow.    [id number]  Woodroe Chen, RN BSN 12/21/2022 2:20 PM

## 2022-12-21 NOTE — Progress Notes (Signed)
Progress Note  Patient Name: Teresa Parks Date of Encounter: 12/21/2022  Primary Cardiologist: New; Dr. Excell Seltzer  Subjective   Recurrent chest pain  Inpatient Medications    Scheduled Meds:  [START ON 12/22/2022] aspirin  81 mg Oral Daily   glimepiride  2 mg Oral Q breakfast   heparin injection (subcutaneous)  5,000 Units Subcutaneous Q8H   insulin aspart  0-20 Units Subcutaneous TID WC   insulin aspart  0-5 Units Subcutaneous QHS   insulin aspart  8 Units Subcutaneous TID WC   insulin glargine-yfgn  25 Units Subcutaneous BID   ondansetron  8 mg Oral Once   rosuvastatin  40 mg Oral Daily   sodium chloride flush  3 mL Intravenous Q12H   ticagrelor  90 mg Oral BID   Continuous Infusions:  sodium chloride 100 mL/hr at 12/21/22 0900   sodium chloride     PRN Meds: sodium chloride, acetaminophen, diazepam, hydrALAZINE, labetalol, morphine injection, ondansetron (ZOFRAN) IV, oxyCODONE, sodium chloride flush   Vital Signs    Vitals:   12/21/22 0800 12/21/22 0815 12/21/22 0825 12/21/22 0900  BP: 94/69 (!) 79/65  (!) 121/90  Pulse: 84 86  83  Resp: 19 (!) 27  (!) 23  Temp: 97.7 F (36.5 C)  98.2 F (36.8 C)   TempSrc: Oral     SpO2: 92% 92%  91%  Weight:      Height:        Intake/Output Summary (Last 24 hours) at 12/21/2022 0959 Last data filed at 12/21/2022 0900 Gross per 24 hour  Intake 662.08 ml  Output 0 ml  Net 662.08 ml    I/O since admission:   Filed Weights   12/21/22 0408  Weight: 95.3 kg    Telemetry    Sinus In the 80s - Personally Reviewed  ECG    12/21/2022 ECG (independently read by me): NSR at 99, inferior ST elevation  Physical Exam    BP (!) 121/90   Pulse 83   Temp 98.2 F (36.8 C)   Resp (!) 23   Ht 5\' 5"  (1.651 m)   Wt 95.3 kg   SpO2 91%   BMI 34.95 kg/m  General: Alert, oriented, no distress.  Skin: normal turgor, no rashes, warm and dry HEENT: Normocephalic, atraumatic. Pupils equal round and reactive to light; sclera  anicteric; extraocular muscles intact Nose without nasal septal hypertrophy Mouth/Parynx benign; Mallinpatti scale 3 Neck: No JVD, no carotid bruits; normal carotid upstroke Lungs: clear to ausculatation and percussion; no wheezing or rales Chest wall: without tenderness to palpitation Heart: PMI not displaced, RRR, s1 s2 normal, 1/6 systolic murmur, no diastolic murmur, no rubs, gallops, thrills, or heaves Abdomen: soft, nontender; no hepatosplenomehaly, BS+; abdominal aorta nontender and not dilated by palpation. Back: no CVA tenderness Pulses 2+ Musculoskeletal: full range of motion, normal strength, no joint deformities Extremities: no clubbing cyanosis or edema, Homan's sign negative  Neurologic: grossly nonfocal; Cranial nerves grossly wnl Psychologic: Normal mood and affect   Labs    Chemistry Recent Labs  Lab 12/21/22 0421  NA 131*  K 3.9  CL 99  CO2 22  GLUCOSE 346*  BUN 16  CREATININE 0.83  CALCIUM 8.8*  GFRNONAA >60  ANIONGAP 10     Hematology Recent Labs  Lab 12/21/22 0421  WBC 9.8  RBC 5.05  HGB 14.8  HCT 44.4  MCV 87.9  MCH 29.3  MCHC 33.3  RDW 15.1  PLT 334    Cardiac EnzymesNo  results for input(s): "TROPONINI" in the last 168 hours. No results for input(s): "TROPIPOC" in the last 168 hours.   BNPNo results for input(s): "BNP", "PROBNP" in the last 168 hours.   DDimer No results for input(s): "DDIMER" in the last 168 hours.   Lipid Panel     Component Value Date/Time   CHOL 170 08/17/2021 1050   TRIG (H) 12/07/2021 0924    1667.0 Triglyceride is over 400; calculations on Lipids are invalid.   HDL 26.60 (L) 08/17/2021 1050   CHOLHDL 6 08/17/2021 1050   VLDL 58.8 (H) 03/15/2021 1328   LDLCALC NOT CALC 09/19/2015 1125   LDLDIRECT 30.0 08/17/2021 1050     Radiology    CARDIAC CATHETERIZATION  Result Date: 12/21/2022   Prox RCA to Mid RCA lesion is 95% stenosed.   A drug-eluting stent was successfully placed using a SYNERGY XD 4.0X28.    Post intervention, there is a 0% residual stenosis.   LV end diastolic pressure is normal.   Recommend uninterrupted dual antiplatelet therapy with Aspirin 81mg  daily and Ticagrelor 90mg  twice daily for a minimum of 12 months (ACS-Class I recommendation). 1.  Severe single-vessel coronary artery disease with severe thrombotic stenosis of the mid RCA, treated successfully with PTCA and stenting using a 4.0 x 28 mm Synergy DES 2.  Patent left main, LAD, intermediate branch, and left circumflex with mild nonobstructive plaquing in the mid LAD 3.  Normal LVEDP Recommendations: Aspirin and ticagrelor without interruption x 12 months.  Aggrastat infusion discontinued at completion of procedure.  Check 2D echo today.  Institute post MI medical therapy.  If patient remains stable without complication, she should be eligible for hospital discharge tomorrow morning directly from the CV-ICU.   DG Chest Portable 1 View  Result Date: 12/21/2022 CLINICAL DATA:  Cough, chest pain. EXAM: PORTABLE CHEST 1 VIEW COMPARISON:  None Available. FINDINGS: The heart size and mediastinal contours are within normal limits. Both lungs are clear. No acute osseous abnormality. IMPRESSION: No active disease. Electronically Signed   By: Thornell Sartorius M.D.   On: 12/21/2022 04:27    Cardiac Studies    CATH/PCI: 12/21/2022   Prox RCA to Mid RCA lesion is 95% stenosed.   A drug-eluting stent was successfully placed using a SYNERGY XD 4.0X28.   Post intervention, there is a 0% residual stenosis.   LV end diastolic pressure is normal.   Recommend uninterrupted dual antiplatelet therapy with Aspirin 81mg  daily and Ticagrelor 90mg  twice daily for a minimum of 12 months (ACS-Class I recommendation).   1.  Severe single-vessel coronary artery disease with severe thrombotic stenosis of the mid RCA, treated successfully with PTCA and stenting using a 4.0 x 28 mm Synergy DES 2.  Patent left main, LAD, intermediate branch, and left circumflex with  mild nonobstructive plaquing in the mid LAD 3.  Normal LVEDP   Recommendations: Aspirin and ticagrelor without interruption x 12 months.  Aggrastat infusion discontinued at completion of procedure.  Check 2D echo today.  Institute post MI medical therapy.  If patient remains stable without complication, she should be eligible for hospital discharge tomorrow morning directly from the CV-ICU.   Intervention       Patient Profile     CHAUNTEL GORSUCH is a 52 y.o. female with diabetes and tobacco abuse who is being seen 12/21/2022 for the evaluation of inferior STEMI.   Assessment & Plan    Inferior STEMI: The patient developed new onset comfort last evening at approximately  11 to 11:30 PM.  Symptoms persisted leading to [facility] where ECG showed inferior STEMI.  Patient was taken acutely to the catheterization laboratory and was found to have use 95% mid RCA stenosis which was successfully stented.  Currently she is pain-free.  She is now on DAPT with aspirin/Brilinta.  Plan 2D echo Doppler study today.  Heart rate currently in the mid 80s.  Will initiate beta-blocker therapy show low-dose ARB therapy. Mixed hyperlipidemia: Patient has been on fenofibrate.  Now on statin with rosuvastatin 40 mg.  Will initiate Vascepa 2 g twice a day.  Remotely she has history of marked hypertriglyceridemia Type 2 diabetes mellitus: She will be restarted on insulin with sliding scale coverage.  Check hemoglobin A1c. Hypertension: Currently stable Tobacco history: Patient has been smoking for over 35 years.  Discussed smoking cessation.    Signed, Lennette Bihari, MD, [facility] 12/21/2022, 9:59 AM

## 2022-12-21 NOTE — Progress Notes (Signed)
eLink Physician-Brief Progress Note Patient Name: Teresa Parks DOB: 1970-12-23 MRN: [id number]   Date of Service  12/21/2022  HPI/Events of Note  52 year old with a history of diabetes, tobacco use, and obesity who initially presents on 6/7 with inferior STEMI.  She was urgently taken for coronary angiography and intervention.  On presentation, she was tachycardic, mildly hypertensive and placed on 2 L of oxygen saturating 95%.  Laboratory studies show hyperglycemia and elevated troponin at 659  Unremarkable chest radiograph  ECG with inferior STEMI   Per coronary angiography, she had single-vessel RCA disease and underwent singular DES.  eICU Interventions  Continue dual antiplatelet therapy with aspirin and Brilinta  Trend troponins till nadir.  GI prophylaxis not indicated.  DVT prophylaxis with heparin subcutaneous added.  A.m. labs for tomorrow ordered.     Intervention Category Evaluation Type: New Patient Evaluation  Aliene Tamura 12/21/2022, 6:39 AM

## 2022-12-21 NOTE — Inpatient Diabetes Management (Signed)
Inpatient Diabetes Program Recommendations  AACE/ADA: New Consensus Statement on Inpatient Glycemic Control (2015)  Target Ranges:  Prepandial:   less than 140 mg/dL      Peak postprandial:   less than 180 mg/dL (1-2 hours)      Critically ill patients:  140 - 180 mg/dL   Lab Results  Component Value Date   GLUCAP 369 (H) 12/21/2022   HGBA1C 7.7 (H) 04/10/2022    Diabetes history: DM2 Outpatient Diabetes medications: Tresiba 52 units bid, Novolog 13-22 units tid , Januvia 50 mg qd, Amaryl 2 mg qd Current orders for Inpatient glycemic control: Teresa Parks ? Dose, Novolog 13-22 units tid, Amaryl 2 mg qd  Inpatient Diabetes Program Recommendations:   Tresiba insulin not on formulary. Please consider changes in insulin regimen:  -Semglee 25 units bid -Novolog 8 units tid meal coverage if eats @ least 50% -Novolog 0-20 units tid, 0-5 units hs  Discussed changes with pharmacist Teresa Parks.  Thank you, Teresa Parks. Teresa Santy, RN, MSN, CDE  Diabetes Coordinator Inpatient Glycemic Control Team Team Pager [phone] (8am-5pm) 12/21/2022 9:15 AM

## 2022-12-21 NOTE — ED Provider Notes (Signed)
[facility] Provider Note   CSN: [id number] Arrival date & time: 12/21/22  0358     History  Chief Complaint  Patient presents with   Chest Pain    Teresa Parks is a 52 y.o. female.  The history is provided by the patient.  Chest Pain Pain location:  Substernal area Pain quality: dull   Pain radiates to:  Neck Pain severity:  Moderate Onset quality:  Sudden Duration: hours. Timing:  Constant Progression:  Unchanged Chronicity:  New Context: at rest   Relieved by:  Nothing Worsened by:  Nothing Ineffective treatments:  None tried Associated symptoms: cough and vomiting   Associated symptoms: no fever   Risk factors: diabetes mellitus and hypertension   Risk factors comment:  High triglycerides      Home Medications Prior to Admission medications   Medication Sig Start Date End Date Taking? Authorizing Provider  amLODipine-olmesartan (AZOR) 10-40 MG tablet Take 1 tablet by mouth daily. Patient taking differently: Take 1 tablet by mouth daily in the afternoon. Pt takes in the pm 08/17/21   Janeece Agee, NP  amoxicillin-clavulanate (AUGMENTIN) 875-125 MG tablet Take 1 tablet by mouth 2 (two) times daily. 10/30/22   Junie Spencer, FNP  cetirizine (ZYRTEC ALLERGY) 10 MG tablet Take 1 tablet (10 mg total) by mouth daily. 10/30/22   Junie Spencer, FNP  cyclobenzaprine (FLEXERIL) 10 MG tablet Take 1 tablet (10 mg total) by mouth at bedtime. Patient taking differently: Take 10 mg by mouth daily as needed for muscle spasms. 08/17/21   Janeece Agee, NP  doxycycline (VIBRA-TABS) 100 MG tablet Take 1 tablet (100 mg total) by mouth 2 (two) times daily. 05/30/22   Margaretann Loveless, PA-C  fenofibrate 160 MG tablet Take 1 tablet (160 mg total) by mouth daily. Patient taking differently: Take 160 mg by mouth daily in the afternoon. 08/17/21   Janeece Agee, NP  fluticasone (FLONASE) 50 MCG/ACT nasal spray Place 2 sprays into  both nostrils daily. 10/30/22   Jannifer Rodney A, FNP  glimepiride (AMARYL) 2 MG tablet Take 2 mg by mouth every morning. 03/21/22   [provider]  Glucagon (BAQSIMI ONE PACK) 3 MG/DOSE POWD Place 1 Dose into the nose as needed (low blood sugar).    [provider]  glucose blood test strip Use as instructed 02/08/14   Viyuoh, Adeline C, MD  Icosapent Ethyl (VASCEPA) 0.5 g CAPS Take 4 capsules by mouth daily. Patient taking differently: Take 2 capsules by mouth daily. Pt takes in the pm 08/17/21   Janeece Agee, NP  insulin aspart (NOVOLOG) 100 UNIT/ML injection Inject 13-22 Units into the skin 3 (three) times daily before meals. PEr pt she takes Novolog Sliding Scale plus 10 units Novolog with each meal  100-150-3 units      greater than 300-12 units  150-200-5 units 200-250-8 units  251-300-10 units    [provider]  insulin degludec (TRESIBA FLEXTOUCH) 200 UNIT/ML FlexTouch Pen Inject 56 Units into the skin daily. And pen needles 1/day Patient taking differently: Inject 50-58 Units into the skin See admin instructions. Inject 58 units in the morning and 50 units in the evening 08/17/21   Janeece Agee, NP  Insulin Pen Needle 31G X 8 MM MISC 1 each by Does not apply route daily. Use to inject insulin daily; E11.65 04/27/19   Romero Belling, MD  JANUVIA 50 MG tablet Take by mouth daily in the afternoon. 03/26/22  [provider]  Lancets (FREESTYLE) lancets Use as instructed 02/08/14   Viyuoh, Adeline C, MD  naproxen sodium (ALEVE) 220 MG tablet Take 220-440 mg by mouth daily as needed (pain).    [provider]  promethazine-dextromethorphan (PROMETHAZINE-DM) 6.25-15 MG/5ML syrup Take 5 mLs by mouth 3 (three) times daily as needed for cough. 10/30/22   Junie Spencer, FNP      Allergies    Novocain [procaine]    Review of Systems   Review of Systems  Constitutional:  Negative for fever.  HENT:  Negative for facial swelling.   Eyes:  Negative  for redness.  Respiratory:  Positive for cough.   Cardiovascular:  Positive for chest pain.  Gastrointestinal:  Positive for vomiting.  All other systems reviewed and are negative.   Physical Exam Updated Vital Signs BP (!) 148/76 (BP Location: Right Arm)   Pulse (!) 102   Temp 97.9 F (36.6 C) (Oral)   Resp 18   Ht 5\' 5"  (1.651 m)   Wt 95.3 kg   SpO2 96%   BMI 34.95 kg/m  Physical Exam Vitals and nursing note reviewed.  Constitutional:      General: She is not in acute distress.    Appearance: Normal appearance. She is well-developed.  HENT:     Head: Normocephalic and atraumatic.     Nose: Nose normal.     Mouth/Throat:     Mouth: Mucous membranes are moist.  Eyes:     Pupils: Pupils are equal, round, and reactive to light.  Cardiovascular:     Rate and Rhythm: Regular rhythm. Tachycardia present.     Pulses: Normal pulses.     Heart sounds: Normal heart sounds.  Pulmonary:     Effort: Pulmonary effort is normal. No respiratory distress.     Breath sounds: Normal breath sounds.  Abdominal:     General: Bowel sounds are normal. There is no distension.     Palpations: Abdomen is soft.     Tenderness: There is no abdominal tenderness. There is no guarding or rebound.  Genitourinary:    Vagina: No vaginal discharge.  Musculoskeletal:        General: Normal range of motion.     Cervical back: Normal range of motion and neck supple.  Skin:    General: Skin is warm and dry.     Capillary Refill: Capillary refill takes less than 2 seconds.     Findings: No erythema or rash.  Neurological:     General: No focal deficit present.     Mental Status: She is alert and oriented to person, place, and time.     Deep Tendon Reflexes: Reflexes normal.  Psychiatric:        Mood and Affect: Mood normal.        Behavior: Behavior normal.     ED Results / Procedures / Treatments   Labs (all labs ordered are listed, but only abnormal results are displayed) Results for orders  placed or performed during the hospital encounter of 12/21/22  CBC with Differential  Result Value Ref Range   WBC 9.8 4.0 - 10.5 K/uL   RBC 5.05 3.87 - 5.11 MIL/uL   Hemoglobin 14.8 12.0 - 15.0 g/dL   HCT 40.9 81.1 - 91.4 %   MCV 87.9 80.0 - 100.0 fL   MCH 29.3 26.0 - 34.0 pg   MCHC 33.3 30.0 - 36.0 g/dL   RDW 78.2 95.6 - 21.3 %   Platelets 334 150 -  400 K/uL   nRBC 0.0 0.0 - 0.2 %   Neutrophils Relative % 80 %   Neutro Abs 7.8 (H) 1.7 - 7.7 K/uL   Lymphocytes Relative 13 %   Lymphs Abs 1.2 0.7 - 4.0 K/uL   Monocytes Relative 5 %   Monocytes Absolute 0.5 0.1 - 1.0 K/uL   Eosinophils Relative 1 %   Eosinophils Absolute 0.1 0.0 - 0.5 K/uL   Basophils Relative 1 %   Basophils Absolute 0.1 0.0 - 0.1 K/uL   Immature Granulocytes 0 %   Abs Immature Granulocytes 0.04 0.00 - 0.07 K/uL   DG Chest Portable 1 View  Result Date: 12/21/2022 CLINICAL DATA:  Cough, chest pain. EXAM: PORTABLE CHEST 1 VIEW COMPARISON:  None Available. FINDINGS: The heart size and mediastinal contours are within normal limits. Both lungs are clear. No acute osseous abnormality. IMPRESSION: No active disease. Electronically Signed   By: Thornell Sartorius M.D.   On: 12/21/2022 04:27    EKG  EKG Interpretation  Date/Time:  Friday December 21 2022 04:13:19 EDT Ventricular Rate:  99 PR Interval:  236 QRS Duration: 106 QT Interval:  340 QTC Calculation: 437 R Axis:   92 Text Interpretation: Sinus rhythm Prolonged PR interval Inferior infarct, acute (RCA) Lateral leads are also involved Probable RV involvement, suggest recording right precordial leads >>> Acute MI <<< Confirmed by Nicanor Alcon, Caroline Longie ([id number]) on 12/21/2022 4:23:40 AM         Radiology No results found.  Procedures Procedures    Medications Ordered in ED Medications  ondansetron (ZOFRAN-ODT) disintegrating tablet 8 mg (has no administration in time range)  heparin bolus via infusion 5,718 Units (has no administration in time range)  aspirin chewable  tablet 324 mg (has no administration in time range)  ondansetron (ZOFRAN) injection 4 mg (has no administration in time range)  fentaNYL (SUBLIMAZE) injection 50 mcg (has no administration in time range)  heparin 5000 UNIT/ML injection (has no administration in time range)    ED Course/ Medical Decision Making/ A&P                             Medical Decision Making Patient with URI symptoms with SSCP at rest and neck pain with emesis   Amount and/or Complexity of Data Reviewed External Data Reviewed: notes.    Details: Previous notes reviewed  Labs: ordered.    Details: All labs ordered but are pending at the time of transfer normal white count 9.8. normal hemoglobin 13.3, normal platelets  Radiology: ordered and independent interpretation performed.    Details: Negative CXR by me  ECG/medicine tests: ordered and independent interpretation performed. Decision-making details documented in ED Course. Discussion of management or test interpretation with external provider(s): Dr Excell Seltzer STEMI doctors accepts Dr. Madilyn Hook is aware of transfer   Case d/w Fleet Contras in [facility] at [facility]   Risk OTC drugs. Prescription drug management. Parenteral controlled substances. Decision regarding hospitalization. Risk Details: EMS en route.  Inferior posterior STEMI.  Preload dependent due to inferior posterior MI.  Zofran, ASAP, heparin bolus and ASA given in the ED.     Critical Care Total time providing critical care: 30 minutes (Bedside care consults and potential for serious outcomes )    Final Clinical Impression(s) / ED Diagnoses Final diagnoses:  ST elevation myocardial infarction (STEMI), unspecified artery (HCC)   The patient appears reasonably stabilized for admission considering the current resources, flow, and capabilities available in the ED  at this time, and I doubt any other [facility] requiring further screening and/or treatment in the ED prior to admission.  Rx / DC Orders ED Discharge  Orders     None         San Lohmeyer, MD 12/21/22 [phone]

## 2022-12-21 NOTE — Progress Notes (Signed)
  Echocardiogram 2D Echocardiogram has been performed.  Teresa Parks 12/21/2022, 3:24 PM

## 2022-12-21 NOTE — TOC Benefit Eligibility Note (Signed)
Patient Product/process development scientist completed.    The patient is currently admitted and upon discharge could be taking Brilinta 90 mg.  The current 30 day co-pay is $442.82 due to a $3500 deductilbe.   The patient is insured through [employer]   This test claim was processed through [employer]- copay amounts may vary at other pharmacies due to [employer], or as the patient moves through the different stages of their insurance plan.  Teresa Parks, CPHT Pharmacy Patient Advocate Specialist [facility] Patient Advocate Team Direct Number: [phone]  Fax: [phone]

## 2022-12-21 NOTE — ED Notes (Addendum)
EDP and EMS at bedside. EMS moving pt to Stretcher and swapping monitor leads. EDP made clear to EMS to run Emergency traffic and no nitro to be given.

## 2022-12-22 ENCOUNTER — Encounter (HOSPITAL_COMMUNITY): Payer: Self-pay | Admitting: Cardiovascular Disease

## 2022-12-22 ENCOUNTER — Telehealth: Payer: Self-pay | Admitting: Physician Assistant

## 2022-12-22 ENCOUNTER — Other Ambulatory Visit (HOSPITAL_COMMUNITY): Payer: Self-pay

## 2022-12-22 DIAGNOSIS — I2510 Atherosclerotic heart disease of native coronary artery without angina pectoris: Secondary | ICD-10-CM

## 2022-12-22 DIAGNOSIS — I2111 ST elevation (STEMI) myocardial infarction involving right coronary artery: Secondary | ICD-10-CM | POA: Diagnosis not present

## 2022-12-22 DIAGNOSIS — I10 Essential (primary) hypertension: Secondary | ICD-10-CM | POA: Diagnosis present

## 2022-12-22 HISTORY — DX: Atherosclerotic heart disease of native coronary artery without angina pectoris: I25.10

## 2022-12-22 LAB — LDL CHOLESTEROL, DIRECT: Direct LDL: 45 mg/dL (ref 0–99)

## 2022-12-22 LAB — COMPREHENSIVE METABOLIC PANEL
ALT: 30 U/L (ref 0–44)
AST: 37 U/L (ref 15–41)
Albumin: 3.1 g/dL — ABNORMAL LOW (ref 3.5–5.0)
Alkaline Phosphatase: 73 U/L (ref 38–126)
Anion gap: 15 (ref 5–15)
BUN: 13 mg/dL (ref 6–20)
CO2: 20 mmol/L — ABNORMAL LOW (ref 22–32)
Calcium: 8.6 mg/dL — ABNORMAL LOW (ref 8.9–10.3)
Chloride: 102 mmol/L (ref 98–111)
Creatinine, Ser: 0.75 mg/dL (ref 0.44–1.00)
GFR, Estimated: >60 mL/min (ref >60)
Glucose, Bld: 178 mg/dL — ABNORMAL HIGH (ref 70–99)
Potassium: 3.6 mmol/L (ref 3.5–5.1)
Sodium: 137 mmol/L (ref 135–145)
Total Bilirubin: 0.5 mg/dL (ref 0.3–1.2)
Total Protein: 6.7 g/dL (ref 6.5–8.1)

## 2022-12-22 LAB — CBC
HCT: 41.9 % (ref 36.0–46.0)
Hemoglobin: 13.8 g/dL (ref 12.0–15.0)
MCH: 29.2 pg (ref 26.0–34.0)
MCHC: 32.9 g/dL (ref 30.0–36.0)
MCV: 88.8 fL (ref 80.0–100.0)
Platelets: 280 10*3/uL (ref 150–400)
RBC: 4.72 MIL/uL (ref 3.87–5.11)
RDW: 14.9 % (ref 11.5–15.5)
WBC: 7.2 10*3/uL (ref 4.0–10.5)
nRBC: 0 % (ref 0.0–0.2)

## 2022-12-22 LAB — HEMOGLOBIN A1C
Hgb A1c MFr Bld: 10.8 % — ABNORMAL HIGH (ref 4.8–5.6)
Mean Plasma Glucose: 263.26 mg/dL

## 2022-12-22 LAB — LIPID PANEL
Cholesterol: 149 mg/dL (ref 0–200)
HDL: 23 mg/dL — ABNORMAL LOW (ref >40)
LDL Cholesterol: UNDETERMINED mg/dL (ref 0–99)
Total CHOL/HDL Ratio: 6.5 RATIO
Triglycerides: 569 mg/dL — ABNORMAL HIGH (ref <150)
VLDL: UNDETERMINED mg/dL (ref 0–40)

## 2022-12-22 LAB — GLUCOSE, CAPILLARY
Glucose-Capillary: 145 mg/dL — ABNORMAL HIGH (ref 70–99)
Glucose-Capillary: 190 mg/dL — ABNORMAL HIGH (ref 70–99)

## 2022-12-22 MED ORDER — METOPROLOL SUCCINATE ER 25 MG PO TB24
25.0000 mg | ORAL_TABLET | Freq: Every day | ORAL | 6 refills | Status: DC
  Filled 2022-12-22: qty 30, 30d supply, fill #0

## 2022-12-22 MED ORDER — ASPIRIN 81 MG PO CHEW: 81.0000 mg | CHEWABLE_TABLET | Freq: Every day | ORAL | Status: DC

## 2022-12-22 MED ORDER — NITROGLYCERIN 0.4 MG SL SUBL
0.4000 mg | SUBLINGUAL_TABLET | SUBLINGUAL | 6 refills | Status: AC | PRN
  Filled 2022-12-22: qty 25, 8d supply, fill #0

## 2022-12-22 MED ORDER — OLMESARTAN MEDOXOMIL 40 MG PO TABS
40.0000 mg | ORAL_TABLET | Freq: Every day | ORAL | 11 refills | Status: DC
  Filled 2022-12-22: qty 30, 30d supply, fill #0

## 2022-12-22 MED ORDER — METOPROLOL SUCCINATE ER 25 MG PO TB24
25.0000 mg | ORAL_TABLET | Freq: Every day | ORAL | Status: DC
  Administered 2022-12-22: 25 mg via ORAL
  Filled 2022-12-22: qty 1

## 2022-12-22 MED ORDER — ROSUVASTATIN CALCIUM 5 MG PO TABS
10.0000 mg | ORAL_TABLET | Freq: Every day | ORAL | Status: DC
  Administered 2022-12-22: 10 mg via ORAL
  Filled 2022-12-22: qty 2

## 2022-12-22 MED ORDER — LIVING WELL WITH DIABETES BOOK
Freq: Once | Status: DC
  Filled 2022-12-22: qty 1

## 2022-12-22 MED ORDER — NITROGLYCERIN 0.4 MG SL SUBL: 0.4000 mg | SUBLINGUAL_TABLET | SUBLINGUAL | Status: DC | PRN

## 2022-12-22 MED ORDER — TICAGRELOR 90 MG PO TABS
90.0000 mg | ORAL_TABLET | Freq: Two times a day (BID) | ORAL | 11 refills | Status: DC
  Filled 2022-12-22: qty 60, 30d supply, fill #0

## 2022-12-22 MED ORDER — ROSUVASTATIN CALCIUM 10 MG PO TABS
10.0000 mg | ORAL_TABLET | Freq: Every day | ORAL | 6 refills | Status: DC
  Filled 2022-12-22: qty 30, 30d supply, fill #0

## 2022-12-22 NOTE — Plan of Care (Addendum)
Patient ready for discharge. Cardiac rehab worked with patient and gave instructions. Please see Cardiac rehab's note. Waiting on TOC meds and discharge orders.  1320: Patient discharged home after all discharge instructions given and questions answered.

## 2022-12-22 NOTE — Telephone Encounter (Signed)
   Transition of Care Follow-up Phone Call Request    Patient Name: Teresa Parks Date of Birth: [birth year] Date of Encounter: 12/22/2022  Primary Care Provider:  Pcp, No Primary Cardiologist:  Tonny Bollman, MD  Teresa Parks has been scheduled for a transition of care follow up appointment with a [facility] provider:  Azalee Course  Please reach out to Luretha Rued Tates within 48 hours to confirm appointment and review transition of care protocol questionnaire.  Azalee Course, [place]  12/22/2022, 9:16 AM

## 2022-12-22 NOTE — Progress Notes (Addendum)
CARDIAC REHAB PHASE I   PRE:  Rate/Rhythm: Sinus 84  BP:    Sitting: 127/71     SaO2: 95% RA  MODE:  Ambulation: 600 ft   POST:  Rate/Rhythem: 78  BP:   Sitting: 148/85     SaO2: 96% RA 1015-1052 Teresa Parks ambulated independently in the hallway increased distance. Tolerated ambulation without difficulty or symptoms. Discussed the importance of taking Brillinta as prescribed. Teresa Parks says she wants to come to outpatient cardiac rehab at [facility] does not want to use [facility] or [facility] for her care. Teresa Parks was given the phone number to [facility] as she needs a new PCP. Discussed the importance of smoking cessation. Given phone number to [place] quit line.   Thayer Headings RN

## 2022-12-22 NOTE — Progress Notes (Addendum)
Progress Note  Patient Name: Teresa Parks Date of Encounter: 12/22/2022  Primary Cardiologist: Tonny Bollman, MD  Interval Summary   No chest pain or shortness of breath.  Ambulated well on the unit.  Vital Signs    Vitals:   12/22/22 0400 12/22/22 0500 12/22/22 0600 12/22/22 0700  BP: (!) 113/56 129/75 124/62 (!) 142/97  Pulse: 68 79 66 85  Resp: (!) 22 (!) 22 (!) 24 (!) 22  Temp: 97.8 F (36.6 C)     TempSrc: Oral     SpO2: 94% 94% 93% 91%  Weight:      Height:        Intake/Output Summary (Last 24 hours) at 12/22/2022 0817 Last data filed at 12/22/2022 0000 Gross per 24 hour  Intake 1237.17 ml  Output --  Net 1237.17 ml   Filed Weights   12/21/22 0408  Weight: 95.3 kg    Physical Exam   GEN: No acute distress.   Neck: No JVD. Cardiac: RRR, no murmur, rub, or gallop.  Respiratory: Nonlabored. Clear to auscultation bilaterally. GI: Soft, nontender, bowel sounds present. MS: No edema. Neuro:  Nonfocal. Psych: Alert and oriented x 3. Normal affect.  ECG/Telemetry    An ECG dated 12/22/2022 was personally reviewed today and demonstrated:  Sinus rhythm with evidence of recent inferior infarct, poor R wave progression.  Telemetry reviewed showing sinus rhythm.  Labs    Chemistry Recent Labs  Lab 12/21/22 0421 12/21/22 1324 12/22/22 0209  NA 131* 134* 137  K 3.9 3.3* 3.6  CL 99 102 102  CO2 22 22 20*  GLUCOSE 346* 197* 178*  BUN 16 14 13   CREATININE 0.83 0.85 0.75  CALCIUM 8.8* 8.9 8.6*  PROT  --   --  6.7  ALBUMIN  --   --  3.1*  AST  --   --  37  ALT  --   --  30  ALKPHOS  --   --  73  BILITOT  --   --  0.5  GFRNONAA >60 >60 >60  ANIONGAP 10 10 15     Hematology Recent Labs  Lab 12/21/22 0421 12/22/22 0209  WBC 9.8 7.2  RBC 5.05 4.72  HGB 14.8 13.8  HCT 44.4 41.9  MCV 87.9 88.8  MCH 29.3 29.2  MCHC 33.3 32.9  RDW 15.1 14.9  PLT 334 280   Cardiac Enzymes Recent Labs  Lab 12/21/22 0421 12/21/22 0707  TROPONINIHS 659* 1,683*    Lipid Panel     Component Value Date/Time   CHOL 149 12/22/2022 0209   TRIG 569 (H) 12/22/2022 0209   HDL 23 (L) 12/22/2022 0209   CHOLHDL 6.5 12/22/2022 0209   VLDL UNABLE TO CALCULATE IF TRIGLYCERIDE OVER 400 mg/dL 16/04/9603 5409   LDLCALC UNABLE TO CALCULATE IF TRIGLYCERIDE OVER 400 mg/dL 81/19/1478 2956   LDLDIRECT 45 12/22/2022 0209    Cardiac Studies   Cardiac catheterization 12/21/2022:   Prox RCA to Mid RCA lesion is 95% stenosed.   A drug-eluting stent was successfully placed using a SYNERGY XD 4.0X28.   Post intervention, there is a 0% residual stenosis.   LV end diastolic pressure is normal.   Recommend uninterrupted dual antiplatelet therapy with Aspirin 81mg  daily and Ticagrelor 90mg  twice daily for a minimum of 12 months (ACS-Class I recommendation).   1.  Severe single-vessel coronary artery disease with severe thrombotic stenosis of the mid RCA, treated successfully with PTCA and stenting using a 4.0 x 28 mm Synergy  DES 2.  Patent left main, LAD, intermediate branch, and left circumflex with mild nonobstructive plaquing in the mid LAD 3.  Normal LVEDP   Recommendations: Aspirin and ticagrelor without interruption x 12 months.  Aggrastat infusion discontinued at completion of procedure.  Check 2D echo today.  Institute post MI medical therapy.  If patient remains stable without complication, she should be eligible for hospital discharge tomorrow morning directly from the CV-ICU.  Echocardiogram 12/21/2022:  1. Left ventricular ejection fraction, by estimation, is 60 to 65%. The  left ventricle has normal function. The left ventricle has no regional  wall motion abnormalities. Left ventricular diastolic parameters are  consistent with Grade I diastolic  dysfunction (impaired relaxation).   2. Right ventricular systolic function is normal. The right ventricular  size is normal.   3. The mitral valve is normal in structure. Trivial mitral valve  regurgitation. No  evidence of mitral stenosis.   4. The aortic valve is normal in structure. Aortic valve regurgitation is  not visualized. No aortic stenosis is present.   5. The inferior vena cava is normal in size with greater than 50%  respiratory variability, suggesting right atrial pressure of 3 mmHg.   Assessment & Plan   1.  Inferior STEMI secondary to severe thrombotic stenosis at the mid RCA treated with angioplasty and DES on June 7.  LVEF 60 to 65% by follow-up echocardiogram.  2.  Essential hypertension.  BP trending up.  At home was on Azor (amlodipine 10 mg and olmesartan 40 mg) daily.  3.  Mixed hyperlipidemia.  Started on Crestor 40 mg daily.  LDL 45 and triglycerides 569 with known history of hypertriglyceridemia.  Continue Vascepa and fenofibrate.  4.  Type 2 diabetes mellitus.  As outpatient was on Januvia, Tresiba, and Amaryl.  5.  Tobacco abuse.  Plan for discharge home today.  Would arrange TOC visit in 7 to 10 days with Dr. Excell Seltzer or APP.  Continue aspirin and Brilinta, decrease Crestor to 10 mg daily and otherwise continue baseline doses of Vascepa and fenofibrate.  Continue present type 2 diabetes mellitus regimen, this can be adjusted further as an outpatient with consideration of SGLT2 inhibitor at that time.  Start Toprol-XL 25 mg daily and instead of going back on Azor at prior dose, would initiate olmesartan alone at 40 mg daily.  Blood pressure regimen can be further titrated as an outpatient.  For questions or updates, please contact [facility] Please consult [email or web address] for contact info under   Signed, Nona Dell, MD  12/22/2022, 8:17 AM

## 2022-12-22 NOTE — Discharge Summary (Addendum)
Discharge Summary    Patient ID: Teresa Parks MRN: [id number]; DOB: 1971-02-03  Admit date: 12/21/2022 Discharge date: 12/22/2022  PCP:  Oneita Hurt, No   [facility] Providers Cardiologist:  Tonny Bollman, MD        Discharge Diagnoses    Principal Problem:   STEMI involving right coronary artery [facility]) Active Problems:   Tobacco abuse   Diabetes (HCC)   Hyperlipidemia associated with type 2 diabetes mellitus (HCC)   Acute ST elevation myocardial infarction (STEMI) of inferior wall (HCC)   CAD in native artery with cardiac cath and DES to [facility] 12/21/22   HTN (hypertension)  Diagnostic Studies/Procedures    CATH/PCI: 12/21/2022   Prox RCA to Mid RCA lesion is 95% stenosed.   A drug-eluting stent was successfully placed using a SYNERGY XD 4.0X28.   Post intervention, there is a 0% residual stenosis.   LV end diastolic pressure is normal.   Recommend uninterrupted dual antiplatelet therapy with Aspirin 81mg  daily and Ticagrelor 90mg  twice daily for a minimum of 12 months (ACS-Class I recommendation).   1.  Severe single-vessel coronary artery disease with severe thrombotic stenosis of the mid RCA, treated successfully with PTCA and stenting using a 4.0 x 28 mm Synergy DES 2.  Patent left main, LAD, intermediate branch, and left circumflex with mild nonobstructive plaquing in the mid LAD 3.  Normal LVEDP   Recommendations: Aspirin and ticagrelor without interruption x 12 months.  Aggrastat infusion discontinued at completion of procedure.  Check 2D echo today.  Institute post MI medical therapy.  If patient remains stable without complication, she should be eligible for hospital discharge tomorrow morning directly from the CV-ICU.    Intervention      _____________  Echo 12/21/22 IMPRESSIONS     1. Left ventricular ejection fraction, by estimation, is 60 to 65%. The  left ventricle has normal function. The left ventricle has no regional  wall motion abnormalities. Left  ventricular diastolic parameters are  consistent with Grade I diastolic  dysfunction (impaired relaxation).   2. Right ventricular systolic function is normal. The right ventricular  size is normal.   3. The mitral valve is normal in structure. Trivial mitral valve  regurgitation. No evidence of mitral stenosis.   4. The aortic valve is normal in structure. Aortic valve regurgitation is  not visualized. No aortic stenosis is present.   5. The inferior vena cava is normal in size with greater than 50%  respiratory variability, suggesting right atrial pressure of 3 mmHg.   FINDINGS   Left Ventricle: Left ventricular ejection fraction, by estimation, is 60  to 65%. The left ventricle has normal function. The left ventricle has no  regional wall motion abnormalities. The left ventricular internal cavity  size was normal in size. There is   no left ventricular hypertrophy. Left ventricular diastolic parameters  are consistent with Grade I diastolic dysfunction (impaired relaxation).   Right Ventricle: The right ventricular size is normal. No increase in  right ventricular wall thickness. Right ventricular systolic function is  normal.   Left Atrium: Left atrial size was normal in size.   Right Atrium: Right atrial size was normal in size.   Pericardium: There is no evidence of pericardial effusion.   Mitral Valve: The mitral valve is normal in structure. Trivial mitral  valve regurgitation. No evidence of mitral valve stenosis.   Tricuspid Valve: The tricuspid valve is normal in structure. Tricuspid  valve regurgitation is not demonstrated. No evidence  of tricuspid  stenosis.   Aortic Valve: The aortic valve is normal in structure. Aortic valve  regurgitation is not visualized. No aortic stenosis is present.   Pulmonic Valve: The pulmonic valve was not well visualized. Pulmonic valve  regurgitation is not visualized. No evidence of pulmonic stenosis.   Aorta: The aortic root is  normal in size and structure.   Venous: The inferior vena cava is normal in size with greater than 50%  respiratory variability, suggesting right atrial pressure of 3 mmHg.   IAS/Shunts: No atrial level shunt detected by color flow Doppler.     History of Present Illness     Teresa Parks is a 52 y.o. female with hx DM-2, tobacco abuse, HTN presented to ER 12/21/22 at 0405 AM that began at 1030 PM the night before.  Pain was continuous and she went to [facility] ED.  Pain in center of her chest that felt like acid reflux a burning and pressure like.  Never severe pain.  Associated symptoms of nausea and cough.  No SOB, or diaphoresis. EKG with ST elevation of inf wall and SR HR 99. Code Stemi was called.   She was taken to cath lab and underwent cardiac cath with findings as above.  She was then admitted to ICU.    Hospital Course     Consultants: none  Pt did well the rest of day 12/21/22 and her Echo with normal EF and no LVH, + G1DD.    STEMI Inf wall s/p emergent cath with DES to RCA.  She will continue Brilinta and ASA.  Her EKG  today Sinus rhythm with evidence of recent inferior infarct, poor R wave progression.  She has been seen by Cardiac Rehab and is referred to rehab at discharge.    She is on high dose statin though decreased to Crestor 10 mg and fenofibrate continued and Lipids were checked  Tchol 149  TG 569 HDL 23  unable to eval LDL  LPa in process and will be reviewed on follow up visit. Will also add vascepa BID  HTN stopped Azor and added torpol XL 25 mg and benicar 40 mg daily.    Her DM-2 poorly controlled with A1C 10.8  she will need to see her pcp/endocrinologist Dennie Maizes, NP at [facility]  --we had diabetes coordinator see pt will discharge on outpt meds.  Consider SGLT2 as outpt.  Tobacco abuse has been instructed on stopping tobacco.    Pt has been seen and evaluated by Dr. Diona Browner and found stable for discharge.       Did the patient have  an acute coronary syndrome (MI, NSTEMI, STEMI, etc) this admission?:  Yes                               AHA/ACC Clinical Performance & Quality Measures: Aspirin prescribed? - Yes ADP Receptor Inhibitor (Plavix/Clopidogrel, Brilinta/Ticagrelor or Effient/Prasugrel) prescribed (includes medically managed patients)? - Yes Beta Blocker prescribed? - Yes High Intensity Statin (Lipitor 40-80mg  or Crestor 20-40mg ) prescribed? - No - due to fenofibrate meds will be adjusted as outpt EF assessed during THIS hospitalization? - Yes For EF <40%, was ACEI/ARB prescribed? - Not Applicable (EF >/= 40%) For EF <40%, Aldosterone Antagonist (Spironolactone or Eplerenone) prescribed? - Not Applicable (EF >/= 40%) Cardiac Rehab Phase II ordered (including medically managed patients)? - Yes       The patient will be scheduled  for a TOC follow up appointment 12/26/22.  A message has been sent to the [facility] and Scheduling Pool at the office where the patient should be seen for follow up.  _____________  Discharge Vitals Blood pressure (!) 142/97, pulse 85, temperature 97.8 F (36.6 C), temperature source Oral, resp. rate (!) 22, height 5\' 5"  (1.651 m), weight 95.3 kg, SpO2 91 %.  Filed Weights   12/21/22 0408  Weight: 95.3 kg    Labs & Radiologic Studies    CBC Recent Labs    12/21/22 0421 12/22/22 0209  WBC 9.8 7.2  NEUTROABS 7.8*  --   HGB 14.8 13.8  HCT 44.4 41.9  MCV 87.9 88.8  PLT 334 280   Basic Metabolic Panel Recent Labs    16/10/96 1324 12/22/22 0209  NA 134* 137  K 3.3* 3.6  CL 102 102  CO2 22 20*  GLUCOSE 197* 178*  BUN 14 13  CREATININE 0.85 0.75  CALCIUM 8.9 8.6*   Liver Function Tests Recent Labs    12/22/22 0209  AST 37  ALT 30  ALKPHOS 73  BILITOT 0.5  PROT 6.7  ALBUMIN 3.1*   No results for input(s): "LIPASE", "AMYLASE" in the last 72 hours. High Sensitivity Troponin:   Recent Labs  Lab 12/21/22 0421 12/21/22 0707  TROPONINIHS 659* 1,683*     BNP Invalid input(s): "POCBNP" D-Dimer No results for input(s): "DDIMER" in the last 72 hours. Hemoglobin A1C Recent Labs    12/22/22 0209  HGBA1C 10.8*   Fasting Lipid Panel Recent Labs    12/22/22 0209  CHOL 149  HDL 23*  LDLCALC UNABLE TO CALCULATE IF TRIGLYCERIDE OVER 400 mg/dL  TRIG 045*  CHOLHDL 6.5  LDLDIRECT 45   Thyroid Function Tests No results for input(s): "TSH", "T4TOTAL", "T3FREE", "THYROIDAB" in the last 72 hours.  Invalid input(s): "FREET3" _____________  ECHOCARDIOGRAM COMPLETE  Result Date: 12/21/2022    ECHOCARDIOGRAM REPORT   Patient Name:   DESTANE KAHN Date of Exam: 12/21/2022 Medical Rec #:  [id number]      Height:       65.0 in Accession #:    [id number]     Weight:       210.0 lb Date of Birth:  09-Apr-1971       BSA:          2.020 m Patient Age:    52 years       BP:           121/90 mmHg Patient Gender: F              HR:           86 bpm. Exam Location:  Inpatient Procedure: 2D Echo, Cardiac Doppler and Color Doppler Indications:    Chest pain  History:        Patient has no prior history of Echocardiogram examinations.                 Risk Factors:Diabetes and Current Smoker.  Sonographer:    Delcie Roch RDCS Referring Phys: [phone] MICHAEL COOPER  Sonographer Comments: Patient is obese and Technically difficult study due to poor echo windows. Image acquisition challenging due to patient body habitus. IMPRESSIONS  1. Left ventricular ejection fraction, by estimation, is 60 to 65%. The left ventricle has normal function. The left ventricle has no regional wall motion abnormalities. Left ventricular diastolic parameters are consistent with Grade I diastolic dysfunction (impaired relaxation).  2. Right ventricular systolic function  is normal. The right ventricular size is normal.  3. The mitral valve is normal in structure. Trivial mitral valve regurgitation. No evidence of mitral stenosis.  4. The aortic valve is normal in structure. Aortic valve regurgitation  is not visualized. No aortic stenosis is present.  5. The inferior vena cava is normal in size with greater than 50% respiratory variability, suggesting right atrial pressure of 3 mmHg. FINDINGS  Left Ventricle: Left ventricular ejection fraction, by estimation, is 60 to 65%. The left ventricle has normal function. The left ventricle has no regional wall motion abnormalities. The left ventricular internal cavity size was normal in size. There is  no left ventricular hypertrophy. Left ventricular diastolic parameters are consistent with Grade I diastolic dysfunction (impaired relaxation). Right Ventricle: The right ventricular size is normal. No increase in right ventricular wall thickness. Right ventricular systolic function is normal. Left Atrium: Left atrial size was normal in size. Right Atrium: Right atrial size was normal in size. Pericardium: There is no evidence of pericardial effusion. Mitral Valve: The mitral valve is normal in structure. Trivial mitral valve regurgitation. No evidence of mitral valve stenosis. Tricuspid Valve: The tricuspid valve is normal in structure. Tricuspid valve regurgitation is not demonstrated. No evidence of tricuspid stenosis. Aortic Valve: The aortic valve is normal in structure. Aortic valve regurgitation is not visualized. No aortic stenosis is present. Pulmonic Valve: The pulmonic valve was not well visualized. Pulmonic valve regurgitation is not visualized. No evidence of pulmonic stenosis. Aorta: The aortic root is normal in size and structure. Venous: The inferior vena cava is normal in size with greater than 50% respiratory variability, suggesting right atrial pressure of 3 mmHg. IAS/Shunts: No atrial level shunt detected by color flow Doppler.  LEFT VENTRICLE PLAX 2D LVIDd:         4.60 cm   Diastology LVIDs:         3.00 cm   LV e' medial:    7.15 cm/s LV PW:         1.10 cm   LV E/e' medial:  11.1 LV IVS:        0.90 cm   LV e' lateral:   8.24 cm/s LVOT diam:     2.10  cm   LV E/e' lateral: 9.7 LV SV:         76 LV SV Index:   38 LVOT Area:     3.46 cm  RIGHT VENTRICLE             IVC RV S prime:     12.60 cm/s  IVC diam: 2.30 cm TAPSE (M-mode): 2.3 cm LEFT ATRIUM             Index LA diam:        3.20 cm 1.58 cm/m LA Vol (A2C):   37.9 ml 18.77 ml/m LA Vol (A4C):   40.7 ml 20.15 ml/m LA Biplane Vol: 40.2 ml 19.91 ml/m  AORTIC VALVE LVOT Vmax:   120.00 cm/s LVOT Vmean:  79.500 cm/s LVOT VTI:    0.219 m  AORTA Ao Root diam: 2.90 cm Ao Asc diam:  3.00 cm MITRAL VALVE MV Area (PHT): 3.48 cm    SHUNTS MV Decel Time: 218 msec    Systemic VTI:  0.22 m MV E velocity: 79.70 cm/s  Systemic Diam: 2.10 cm MV A velocity: 88.00 cm/s MV E/A ratio:  0.91 Arvilla Meres MD Electronically signed by Arvilla Meres MD Signature Date/Time: 12/21/2022/3:46:29 PM    Final  CARDIAC CATHETERIZATION  Result Date: 12/21/2022   Prox RCA to Mid RCA lesion is 95% stenosed.   A drug-eluting stent was successfully placed using a SYNERGY XD 4.0X28.   Post intervention, there is a 0% residual stenosis.   LV end diastolic pressure is normal.   Recommend uninterrupted dual antiplatelet therapy with Aspirin 81mg  daily and Ticagrelor 90mg  twice daily for a minimum of 12 months (ACS-Class I recommendation). 1.  Severe single-vessel coronary artery disease with severe thrombotic stenosis of the mid RCA, treated successfully with PTCA and stenting using a 4.0 x 28 mm Synergy DES 2.  Patent left main, LAD, intermediate branch, and left circumflex with mild nonobstructive plaquing in the mid LAD 3.  Normal LVEDP Recommendations: Aspirin and ticagrelor without interruption x 12 months.  Aggrastat infusion discontinued at completion of procedure.  Check 2D echo today.  Institute post MI medical therapy.  If patient remains stable without complication, she should be eligible for hospital discharge tomorrow morning directly from the CV-ICU.   DG Chest Portable 1 View  Result Date: 12/21/2022 CLINICAL DATA:   Cough, chest pain. EXAM: PORTABLE CHEST 1 VIEW COMPARISON:  None Available. FINDINGS: The heart size and mediastinal contours are within normal limits. Both lungs are clear. No acute osseous abnormality. IMPRESSION: No active disease. Electronically Signed   By: Thornell Sartorius M.D.   On: 12/21/2022 04:27   Disposition   Pt is being discharged home today in good condition.  Follow-up Plans & Appointments   Please Note appt is at [facility] office - address is written  Call [facility] at [phone] if any bleeding, swelling or drainage at cath site.  May shower, no tub baths for 48 hours for groin sticks. No lifting over 5 pounds for 5 days.  No Driving for 5 days   Heart Healthy Diabetic Diet  Please stop smoking will be better for your heart  No work until after seen in the office   Take 1 NTG, under your tongue, while sitting.  If no relief of pain may repeat NTG, one tab every 5 minutes up to 3 tablets total over 15 minutes.  If no relief CALL 911.  If you have dizziness/lightheadness  while taking NTG, stop taking and call 911.        Do Not stop asprin and Brilinta they are to keep your new stent open.  Stopping could cause a heart attack.    Follow-up Information     Azalee Course, [place] Follow up on 12/26/2022.   Specialties: Cardiology, Radiology Why: 9:15AM. Cardiology follow up Contact information: [address] [place] [address] [phone]         Mariea Stable, NP Follow up.   Specialty: Nurse Practitioner Why: please call and make appointment to be seen in 1-2 weeks.  your diabetes is not well controlled.  your A1C is 10.8 Contact information: [address] [place] [address] [phone]                Discharge Instructions     Amb Referral to Cardiac Rehabilitation   Complete by: As directed    Diagnosis:  Coronary Stents STEMI     After initial evaluation and assessments completed:  Virtual Based Care may be provided alone or in conjunction with Phase 2 Cardiac Rehab based on patient barriers.: Yes   Intensive Cardiac Rehabilitation (ICR) [facility] location only OR Traditional Cardiac Rehabilitation (TCR) *If criteria for ICR are not  met will enroll in TCR [facility] only): Yes        Discharge Medications   Allergies as of 12/22/2022       Reactions   Novocain [procaine] Nausea And Vomiting        Medication List     STOP taking these medications    amLODipine-olmesartan 10-40 MG tablet Commonly known as: AZOR   cyclobenzaprine 10 MG tablet Commonly known as: FLEXERIL   naproxen sodium 220 MG tablet Commonly known as: ALEVE       TAKE these medications    acetaminophen 325 MG tablet Commonly known as: TYLENOL Take 325-650 mg by mouth as needed for headache.   aspirin 81 MG chewable tablet Chew 1 tablet (81 mg total) by mouth daily.   Baqsimi One Pack 3 MG/DOSE Powd Generic drug: Glucagon Place 1 Dose into the nose as needed (low blood sugar).   cetirizine 10 MG tablet Commonly known as: ZyrTEC Allergy Take 1 tablet (10 mg total) by mouth daily.   fenofibrate 160 MG tablet Take 1 tablet (160 mg total) by mouth daily. What changed: when to take this   fluticasone 50 MCG/ACT nasal spray Commonly known as: FLONASE Place 2 sprays into both nostrils daily.   freestyle lancets Use as instructed   glimepiride 2 MG tablet Commonly known as: AMARYL Take 2 mg by mouth every morning.   glucose blood test strip Use as instructed   Icosapent Ethyl 0.5 g Caps Commonly known as: Vascepa Take 4 capsules by mouth daily. What changed: how much to take   insulin aspart 100 UNIT/ML injection Commonly known as: novoLOG Inject 15-35 Units into the skin 3 (three) times daily before meals.   metoprolol succinate 25 MG 24 hr tablet Commonly known as: TOPROL-XL Take 1 tablet (25 mg total) by mouth daily.   nitroGLYCERIN 0.4 MG SL tablet Commonly known  as: NITROSTAT Place 1 tablet (0.4 mg total) under the tongue every 5 (five) minutes as needed for chest pain.   olmesartan 40 MG tablet Commonly known as: BENICAR Take 1 tablet (40 mg total) by mouth daily.   rosuvastatin 10 MG tablet Commonly known as: CRESTOR Take 1 tablet (10 mg total) by mouth daily.   sitaGLIPtin 100 MG tablet Commonly known as: JANUVIA Take 100 mg by mouth daily.   ticagrelor 90 MG Tabs tablet Commonly known as: BRILINTA Take 1 tablet (90 mg total) by mouth 2 (two) times daily.   Evaristo Bury FlexTouch 200 UNIT/ML FlexTouch Pen Generic drug: insulin degludec Inject 56 Units into the skin daily. And pen needles 1/day What changed:  how much to take when to take this additional instructions           Outstanding Labs/Studies   BMP  on OV 6-8 weeks hepatic and lipids   Duration of Discharge Encounter   Greater than 30 minutes including physician time.  Signed, Nada Boozer, NP 12/22/2022, 10:19 AM

## 2022-12-22 NOTE — Discharge Instructions (Addendum)
Please Note appt is at [facility] office - address is written  Call [facility] at [phone] if any bleeding, swelling or drainage at cath site.  May shower, no tub baths for 48 hours for groin sticks. No lifting over 5 pounds for 5 days.  No Driving for 5 days   Heart Healthy Diabetic Diet  Please stop smoking will be better for your heart  No work until after seen in the office   Take 1 NTG, under your tongue, while sitting.  If no relief of pain may repeat NTG, one tab every 5 minutes up to 3 tablets total over 15 minutes.  If no relief CALL 911.  If you have dizziness/lightheadness  while taking NTG, stop taking and call 911.         Do Not stop asprin and Brilinta they are to keep your new stent open.  Stopping could cause a heart attack.

## 2022-12-24 NOTE — Telephone Encounter (Signed)
Patient contacted regarding discharge from [facility] hospital on 12/22/22.  Patient understands to follow up with Azalee Course PA on 12/26/22 at 9:15 am at [facility] office. Patient understands discharge instructions. Patient understands medications and regiment. Patient understands to bring all medications to this visit.

## 2022-12-24 NOTE — TOC Initial Note (Signed)
Transition of Care [facility]) - Initial/Assessment Note    Patient Details  Name: Teresa Parks MRN: [id number] Date of Birth: 1970-08-24  Transition of Care [facility]) CM/SW Contact:    Elliot Cousin, RN Phone Number: [phone] 12/24/2022, 9:47 AM  Clinical Narrative:  TOC CM spoke to pt at bedside. States husband in home to assist with care. States she works full-time. No noted needed for work.                   Expected Discharge Plan: Home/Self Care Barriers to Discharge: No Barriers Identified   Patient Goals and CMS Choice Patient states their goals for this hospitalization and ongoing recovery are:: wants to remain independent          Expected Discharge Plan and Services   Discharge Planning Services: CM Consult   Living arrangements for the past 2 months: Single Family Home Expected Discharge Date: 12/22/22                                    Prior Living Arrangements/Services Living arrangements for the past 2 months: Single Family Home Lives with:: Spouse Patient language and need for interpreter reviewed:: Yes Do you feel safe going back to the place where you live?: Yes      Need for Family Participation in Patient Care: No (Comment) Care giver support system in place?: No (comment)   Criminal Activity/Legal Involvement Pertinent to Current Situation/Hospitalization: No - Comment as needed  Activities of Daily Living Home Assistive Devices/Equipment: None ADL Screening (condition at time of admission) Patient's cognitive ability adequate to safely complete daily activities?: Yes Is the patient deaf or have difficulty hearing?: No Does the patient have difficulty seeing, even when wearing glasses/contacts?: No Does the patient have difficulty concentrating, remembering, or making decisions?: No Patient able to express need for assistance with ADLs?: Yes Does the patient have difficulty dressing or bathing?: No Independently performs ADLs?: Yes  (appropriate for developmental age) Does the patient have difficulty walking or climbing stairs?: No Weakness of Legs: None Weakness of Arms/Hands: None  Permission Sought/Granted Permission sought to share information with : Case Manager, Family Supports Permission granted to share information with : Yes, Verbal Permission Granted  Share Information with NAME: Teresa Parks     Permission granted to share info w Relationship: husband  Permission granted to share info w Contact Information: [phone]  Emotional Assessment Appearance:: Appears stated age Attitude/Demeanor/Rapport: Engaged Affect (typically observed): Accepting Orientation: : Oriented to Self, Oriented to Place, Oriented to  Time, Oriented to Situation   Psych Involvement: No (comment)  Admission diagnosis:  STEMI (ST elevation myocardial infarction) (HCC) [I21.3] ST elevation myocardial infarction (STEMI), unspecified artery (HCC) [I21.3] STEMI involving right coronary artery (HCC) [I21.11] Patient Active Problem List   Diagnosis Date Noted   CAD in native artery with cardiac cath and DES to [facility] 12/21/22 12/22/2022   HTN (hypertension) 12/22/2022   Acute ST elevation myocardial infarction (STEMI) of inferior wall (HCC) 12/21/2022   STEMI involving right coronary artery (HCC) 12/21/2022   Thickened endometrium    LUQ abdominal pain 01/25/2022   Screening for colon cancer 01/16/2022   Pelvic mass 01/16/2022   Elevated CEA 01/16/2022   Right wrist pain 01/12/2019   Acute left ankle pain 01/12/2019   DJD (degenerative joint disease), lumbar 05/08/2018   Apneic episode 01/13/2017   Sacroiliac pain 08/19/2016   Hx  of pancreatitis 07/05/2015   Hyperlipidemia associated with type 2 diabetes mellitus (HCC) 05/10/2014   Obesity 05/10/2014   Diabetes (HCC) 03/15/2014   Hypertriglyceridemia 03/15/2014   Tobacco abuse 02/06/2014   Nausea and vomiting 02/06/2014   OA (osteoarthritis) of knee 02/06/2014   PCP:  Pcp,  No Pharmacy:   [facility] [address] [place] - [address] [place] [address] Phone: [phone] Fax: [phone]  [facility] [address] [place], [place] - [address] [place] [address] Phone: [phone] Fax: [phone]  [facility] [address] [place] [address] Phone: [phone] Fax: [phone]     Social Determinants of Health (SDOH) Social History: SDOH Screenings   Food Insecurity: No Food Insecurity (12/21/2022)  Housing: Low Risk  (12/21/2022)  Transportation Needs: No Transportation Needs (12/21/2022)  Utilities: Not At Risk (12/21/2022)  Depression (PHQ2-9): Low Risk  (08/17/2021)  Tobacco Use: High Risk (12/22/2022)   SDOH Interventions:     Readmission Risk Interventions     No data to display

## 2022-12-25 ENCOUNTER — Telehealth (HOSPITAL_COMMUNITY): Payer: Self-pay

## 2022-12-25 NOTE — Telephone Encounter (Signed)
Called patient to see if she is interested in the Cardiac Rehab Program. Patient expressed interest. Explained scheduling process patient verbalized understanding. Will contact patient for scheduling once f/u has been completed.  

## 2022-12-26 ENCOUNTER — Encounter: Payer: Self-pay | Admitting: Physician Assistant

## 2022-12-26 ENCOUNTER — Ambulatory Visit: Payer: BC Managed Care – PPO | Attending: Physician Assistant | Admitting: Physician Assistant

## 2022-12-26 VITALS — BP 122/62 | HR 75 | Ht 65.0 in | Wt 214.8 lb

## 2022-12-26 DIAGNOSIS — I2111 ST elevation (STEMI) myocardial infarction involving right coronary artery: Secondary | ICD-10-CM

## 2022-12-26 DIAGNOSIS — Z79899 Other long term (current) drug therapy: Secondary | ICD-10-CM

## 2022-12-26 DIAGNOSIS — E1169 Type 2 diabetes mellitus with other specified complication: Secondary | ICD-10-CM | POA: Diagnosis not present

## 2022-12-26 DIAGNOSIS — E119 Type 2 diabetes mellitus without complications: Secondary | ICD-10-CM

## 2022-12-26 DIAGNOSIS — I10 Essential (primary) hypertension: Secondary | ICD-10-CM

## 2022-12-26 DIAGNOSIS — E785 Hyperlipidemia, unspecified: Secondary | ICD-10-CM

## 2022-12-26 DIAGNOSIS — Z7984 Long term (current) use of oral hypoglycemic drugs: Secondary | ICD-10-CM

## 2022-12-26 LAB — LIPOPROTEIN A (LPA): Lipoprotein (a): 9.9 nmol/L (ref <75.0)

## 2022-12-26 NOTE — Patient Instructions (Signed)
Medication Instructions:   Your physician recommends that you continue on your current medications as directed. Please refer to the Current Medication list given to you today.  *If you need a refill on your cardiac medications before your next appointment, please call your pharmacy*  Lab Work: Azalee Course, PA-C recommends that you return for lab work in 1-2 weeks:  BMP  Azalee Course, PA-C recommends that you return for lab work in 6-8 weeks:   Fasting Lipid Panel-DO NOT eat or drink past midnight. Okay to have water and/or black coffee only the morning of lab work Hepatic (Liver) Function Test  If you have labs (blood work) drawn today and your tests are completely normal, you will receive your results only by: MyChart Message (if you have MyChart) OR A paper copy in the mail If you have any lab test that is abnormal or we need to change your treatment, we will call you to review the results.   Testing/Procedures: NONE ordered at this time of appointment   Follow-Up: At [facility], you and your health needs are our priority.  As part of our continuing mission to provide you with exceptional heart care, we have created designated Provider Care Teams.  These Care Teams include your primary Cardiologist (physician) and Advanced Practice Providers (APPs -  Physician Assistants and Nurse Practitioners) who all work together to provide you with the care you need, when you need it.  We recommend signing up for the patient portal called "MyChart".  Sign up information is provided on this After Visit Summary.  MyChart is used to connect with patients for Virtual Visits (Telemedicine).  Patients are able to view lab/test results, encounter notes, upcoming appointments, etc.  Non-urgent messages can be sent to your provider as well.   To learn more about what you can do with MyChart, go to [email or web address].    Your next appointment:   3-4 month(s)  Provider:   Tonny Bollman, MD      Other Instructions

## 2022-12-26 NOTE — Progress Notes (Signed)
Cardiology Office Note:  .   Date:  12/28/2022  ID:  Teresa Parks, DOB Dec 20, 1970, MRN [id number] PCP: Aviva Kluver  [facility] Providers Cardiologist:  Tonny Bollman, MD     History of Present Illness: Teresa Parks is a 52 y.o. female with past medical history of chronic pancreatitis, hypertension, hyperlipidemia, DM2 and obesity who was recently admitted to the hospital on 12/21/2022 as inferior STEMI.  She initially went to [facility], however transferred urgently to [facility] due to EKG changes and troponin elevation.  Urgent cardiac catheterization performed on 12/21/2022 showed a 95% proximal to mid RCA lesion treated with Synergy XD 4.0 x 28 mm DES.  Postprocedure, she was started on aspirin and Brilinta.  Echocardiogram obtained on 12/21/2022 showed EF 60 to 65%, grade 1 DD, normal RV, trivial MR.  She was started on Crestor and Vascepa for cholesterol control.  He was placed on Toprol-XL 25 mg daily.  Lipid panel obtained on 12/22/2022 showed a total cholesterol 149, HDL 23, triglyceride 569, LDL was unable to be calculated.  Hemoglobin A1c was 10.8.  Patient presents today for follow-up.  We discussed the recent cardiac catheterization.  She has been compliant with aspirin and Brilinta.  She has been having sinus issue and cough for the past 2 weeks.  I recommended she avoid Sudafed or Afrin spray which can potentially raises her heart rate.  She can use normal saline spray or Nettie pot to help with sinus issue.  I recommend in 1 to 2 weeks basic metabolic panel to make sure recent medication adjustment have not shifted her renal function and only impacted her electrolyte.  I did recommend she hold off on going to work until 01/07/2023.  She will need a fasting lipid panel and LFTs in 6 weeks.  She can follow-up with Dr. Excell Seltzer in 3 to 4 months.   ROS:  She denies chest pain, palpitations, dyspnea, pnd, orthopnea, n, v, dizziness, syncope, edema, weight gain, or early  satiety. All other systems reviewed and are otherwise negative except as noted above.    Studies Reviewed: Marland Kitchen    EKG: Normal sinus rhythm, no significant ST-T wave changes.  Cardiac Studies & Procedures   CARDIAC CATHETERIZATION  CARDIAC CATHETERIZATION 12/21/2022  Narrative   Prox RCA to Mid RCA lesion is 95% stenosed.   A drug-eluting stent was successfully placed using a SYNERGY XD 4.0X28.   Post intervention, there is a 0% residual stenosis.   LV end diastolic pressure is normal.   Recommend uninterrupted dual antiplatelet therapy with Aspirin 81mg  daily and Ticagrelor 90mg  twice daily for a minimum of 12 months (ACS-Class I recommendation).  1.  Severe single-vessel coronary artery disease with severe thrombotic stenosis of the mid RCA, treated successfully with PTCA and stenting using a 4.0 x 28 mm Synergy DES 2.  Patent left main, LAD, intermediate branch, and left circumflex with mild nonobstructive plaquing in the mid LAD 3.  Normal LVEDP  Recommendations: Aspirin and ticagrelor without interruption x 12 months.  Aggrastat infusion discontinued at completion of procedure.  Check 2D echo today.  Institute post MI medical therapy.  If patient remains stable without complication, she should be eligible for hospital discharge tomorrow morning directly from the CV-ICU.  Findings Coronary Findings Diagnostic  Dominance: Right  Left Main The vessel exhibits minimal luminal irregularities. Widely patent vessel, trifurcates into the LAD, intermediate branch, and circumflex  Left Anterior Descending There is mild diffuse  disease throughout the vessel. The LAD reaches the apex.  The first diagonal is patent.  The mid LAD has mild irregularities but no stenosis greater than 30%.  Ramus Intermedius Vessel is angiographically normal.  Left Circumflex Vessel is angiographically normal. Widely patent vessel, supplies an obtuse marginal branch and a bifurcating posterolateral branch.  No  stenosis throughout.  Right Coronary Artery Prox RCA to Mid RCA lesion is 95% stenosed. Vessel is the culprit lesion. The lesion is type C, ulcerative and heavily thrombotic.  Intervention  Prox RCA to Mid RCA lesion Stent CATH VISTA GUIDE 6FR JR4 guide catheter was inserted. Lesion crossed with guidewire using a WIRE RUNTHROUGH .[id number]. Pre-stent angioplasty was performed using a BALLN EMERGE MR 3.0X20. Maximum pressure:  6 atm. A drug-eluting stent was successfully placed using a SYNERGY XD 4.0X28. Maximum pressure: 14 atm. Post-stent angioplasty was performed using a BALL SAPPHIRE NC24 4.5X18. Maximum pressure:  14 atm. Post-Intervention Lesion Assessment The intervention was successful. Pre-interventional TIMI flow is 3. Post-intervention TIMI flow is 3. No complications occurred at this lesion. There is a 0% residual stenosis post intervention.     ECHOCARDIOGRAM  ECHOCARDIOGRAM COMPLETE 12/21/2022  Narrative ECHOCARDIOGRAM REPORT    Patient Name:   Teresa Parks Date of Exam: 12/21/2022 Medical Rec #:  [id number]      Height:       65.0 in Accession #:    [id number]     Weight:       210.0 lb Date of Birth:  [birth year]       BSA:          2.020 m Patient Age:    52 years       BP:           121/90 mmHg Patient Gender: F              HR:           86 bpm. Exam Location:  Inpatient  Procedure: 2D Echo, Cardiac Doppler and Color Doppler  Indications:    Chest pain  History:        Patient has no prior history of Echocardiogram examinations. Risk Factors:Diabetes and Current Smoker.  Sonographer:    Delcie Roch RDCS Referring Phys: [phone] MICHAEL COOPER   Sonographer Comments: Patient is obese and Technically difficult study due to poor echo windows. Image acquisition challenging due to patient body habitus. IMPRESSIONS   1. Left ventricular ejection fraction, by estimation, is 60 to 65%. The left ventricle has normal function. The left ventricle has no regional  wall motion abnormalities. Left ventricular diastolic parameters are consistent with Grade I diastolic dysfunction (impaired relaxation). 2. Right ventricular systolic function is normal. The right ventricular size is normal. 3. The mitral valve is normal in structure. Trivial mitral valve regurgitation. No evidence of mitral stenosis. 4. The aortic valve is normal in structure. Aortic valve regurgitation is not visualized. No aortic stenosis is present. 5. The inferior vena cava is normal in size with greater than 50% respiratory variability, suggesting right atrial pressure of 3 mmHg.  FINDINGS Left Ventricle: Left ventricular ejection fraction, by estimation, is 60 to 65%. The left ventricle has normal function. The left ventricle has no regional wall motion abnormalities. The left ventricular internal cavity size was normal in size. There is no left ventricular hypertrophy. Left ventricular diastolic parameters are consistent with Grade I diastolic dysfunction (impaired relaxation).  Right Ventricle: The right ventricular size is normal. No increase in right ventricular wall  thickness. Right ventricular systolic function is normal.  Left Atrium: Left atrial size was normal in size.  Right Atrium: Right atrial size was normal in size.  Pericardium: There is no evidence of pericardial effusion.  Mitral Valve: The mitral valve is normal in structure. Trivial mitral valve regurgitation. No evidence of mitral valve stenosis.  Tricuspid Valve: The tricuspid valve is normal in structure. Tricuspid valve regurgitation is not demonstrated. No evidence of tricuspid stenosis.  Aortic Valve: The aortic valve is normal in structure. Aortic valve regurgitation is not visualized. No aortic stenosis is present.  Pulmonic Valve: The pulmonic valve was not well visualized. Pulmonic valve regurgitation is not visualized. No evidence of pulmonic stenosis.  Aorta: The aortic root is normal in size and  structure.  Venous: The inferior vena cava is normal in size with greater than 50% respiratory variability, suggesting right atrial pressure of 3 mmHg.  IAS/Shunts: No atrial level shunt detected by color flow Doppler.   LEFT VENTRICLE PLAX 2D LVIDd:         4.60 cm   Diastology LVIDs:         3.00 cm   LV e' medial:    7.15 cm/s LV PW:         1.10 cm   LV E/e' medial:  11.1 LV IVS:        0.90 cm   LV e' lateral:   8.24 cm/s LVOT diam:     2.10 cm   LV E/e' lateral: 9.7 LV SV:         76 LV SV Index:   38 LVOT Area:     3.46 cm   RIGHT VENTRICLE             IVC RV S prime:     12.60 cm/s  IVC diam: 2.30 cm TAPSE (M-mode): 2.3 cm  LEFT ATRIUM             Index LA diam:        3.20 cm 1.58 cm/m LA Vol (A2C):   37.9 ml 18.77 ml/m LA Vol (A4C):   40.7 ml 20.15 ml/m LA Biplane Vol: 40.2 ml 19.91 ml/m AORTIC VALVE LVOT Vmax:   120.00 cm/s LVOT Vmean:  79.500 cm/s LVOT VTI:    0.219 m  AORTA Ao Root diam: 2.90 cm Ao Asc diam:  3.00 cm  MITRAL VALVE MV Area (PHT): 3.48 cm    SHUNTS MV Decel Time: 218 msec    Systemic VTI:  0.22 m MV E velocity: 79.70 cm/s  Systemic Diam: 2.10 cm MV A velocity: 88.00 cm/s MV E/A ratio:  0.91  Arvilla Meres MD Electronically signed by Arvilla Meres MD Signature Date/Time: 12/21/2022/3:46:29 PM    Final              Risk Assessment/Calculations:     BP 122/62, pulse 75, O2 saturation 94%     Physical Exam:   VS:  BP 122/62 (BP Location: Right Arm, Patient Position: Sitting, Cuff Size: Normal)   Pulse 75   Ht 5\' 5"  (1.651 m)   Wt 214 lb 12.8 oz (97.4 kg)   SpO2 94%   BMI 35.74 kg/m    Wt Readings from Last 3 Encounters:  12/26/22 214 lb 12.8 oz (97.4 kg)  12/21/22 210 lb (95.3 kg)  05/11/22 212 lb (96.2 kg)    GEN: Well nourished, well developed in no acute distress NECK: No JVD; No carotid bruits CARDIAC: RRR, no murmurs, rubs, gallops RESPIRATORY:  Clear to auscultation without rales, wheezing or rhonchi   ABDOMEN: Soft, non-tender, non-distended EXTREMITIES:  No edema; No deformity   ASSESSMENT AND PLAN: .    Inferior STEMI: Patient underwent DES to proximal RCA.  Continue aspirin and Brilinta.  Hypertension: Blood pressure stable.  Obtain basic metabolic panel in 1 to 2 weeks.  Hyperlipidemia: On Crestor fenofibrate and Vascepa.  Obtain fasting lipid panel and LFT in 6 weeks.    Cardiac Rehabilitation Eligibility Assessment  The patient is ready to start cardiac rehabilitation from a cardiac standpoint.       Dispo: Follow-up with Dr. Excell Seltzer in 3 to 4 months.  Signed, Azalee Course, PA

## 2022-12-28 ENCOUNTER — Telehealth (HOSPITAL_COMMUNITY): Payer: Self-pay

## 2022-12-28 NOTE — Telephone Encounter (Signed)
Called patient to see if she was interested in participating in the Cardiac Rehab Program. Patient stated yes. Patient will come in for orientation on 01/10/23 @ 1:15PM and will attend the 6:45AM exercise class.   Pensions consultant.

## 2022-12-28 NOTE — Telephone Encounter (Signed)
Pt insurance is active and benefits verified through [employer]. Co-pay $0.00, DED $3,500.00/$3,500.00 met, out of pocket $5,000.00/$3,697.55 met, co-insurance 20%. No pre-authorization required. [employer], 12/28/2022 @ 3:47PM, REF#[id number]   How many CR sessions are covered? (36 visits for TCR, 72 visits for ICR)72 Is this a lifetime maximum or an annual maximum? Annual Has the member used any of these services to date? No Is there a time limit (weeks/months) on start of program and/or program completion? No

## 2022-12-31 ENCOUNTER — Other Ambulatory Visit (HOSPITAL_COMMUNITY): Payer: Self-pay | Admitting: *Deleted

## 2022-12-31 DIAGNOSIS — Z955 Presence of coronary angioplasty implant and graft: Secondary | ICD-10-CM

## 2022-12-31 DIAGNOSIS — I2111 ST elevation (STEMI) myocardial infarction involving right coronary artery: Secondary | ICD-10-CM

## 2023-01-02 DIAGNOSIS — E1165 Type 2 diabetes mellitus with hyperglycemia: Secondary | ICD-10-CM | POA: Diagnosis not present

## 2023-01-02 DIAGNOSIS — E781 Pure hyperglyceridemia: Secondary | ICD-10-CM | POA: Diagnosis not present

## 2023-01-02 DIAGNOSIS — I10 Essential (primary) hypertension: Secondary | ICD-10-CM | POA: Diagnosis not present

## 2023-01-08 ENCOUNTER — Telehealth (HOSPITAL_COMMUNITY): Payer: Self-pay

## 2023-01-08 NOTE — Telephone Encounter (Signed)
Reviewed with patient the Cardiac Rehab Cardiac Risk Prolife Nursing Assessment. Patient knows office location, and to wear comfortable clothing/closed-toed shoes. Recommended to patient to eat, take medications, and if diabetic, to check blood sugar before coming to classes. Explained orientation may take 2 hours.  

## 2023-01-10 ENCOUNTER — Encounter (HOSPITAL_COMMUNITY): Payer: Self-pay

## 2023-01-10 ENCOUNTER — Other Ambulatory Visit: Payer: Self-pay

## 2023-01-10 ENCOUNTER — Encounter (HOSPITAL_COMMUNITY)
Admission: RE | Admit: 2023-01-10 | Discharge: 2023-01-10 | Disposition: A | Discharge status: Home / Self Care | Payer: BC Managed Care – PPO | Source: Ambulatory Visit | Attending: Cardiovascular Disease | Admitting: Cardiovascular Disease

## 2023-01-10 VITALS — BP 134/66 | HR 77 | Ht 65.5 in | Wt 212.7 lb

## 2023-01-10 DIAGNOSIS — Z955 Presence of coronary angioplasty implant and graft: Secondary | ICD-10-CM | POA: Insufficient documentation

## 2023-01-10 DIAGNOSIS — I2111 ST elevation (STEMI) myocardial infarction involving right coronary artery: Secondary | ICD-10-CM | POA: Insufficient documentation

## 2023-01-10 MED ORDER — METOPROLOL SUCCINATE ER 25 MG PO TB24: 25.0000 mg | ORAL_TABLET | Freq: Every day | ORAL | 3 refills | Status: DC

## 2023-01-10 MED ORDER — OLMESARTAN MEDOXOMIL 40 MG PO TABS: 40.0000 mg | ORAL_TABLET | Freq: Every day | ORAL | 3 refills | Status: DC

## 2023-01-10 MED ORDER — TICAGRELOR 90 MG PO TABS: 90.0000 mg | ORAL_TABLET | Freq: Two times a day (BID) | ORAL | 3 refills | Status: DC

## 2023-01-10 MED ORDER — ROSUVASTATIN CALCIUM 10 MG PO TABS: 10.0000 mg | ORAL_TABLET | Freq: Every day | ORAL | 3 refills | Status: DC

## 2023-01-10 NOTE — Telephone Encounter (Signed)
Pt's medications were sent to pt's pharmacy as requested. Confirmation received.  

## 2023-01-10 NOTE — Progress Notes (Signed)
Cardiac Rehab Medication Review by a Nurse  Does the patient  feel that his/her medications are working for him/her?  yes  Has the patient been experiencing any side effects to the medications prescribed?  yes  Does the patient measure his/her own blood pressure or blood glucose at home?  yes   Does the patient have any problems obtaining medications due to transportation or finances?   no  Understanding of regimen: good Understanding of indications: good Potential of compliance: good    Nurse  comments: Teresa Parks is taking her medications as prescribed. Dr Earmon Phoenix office called as Teresa Parks's wants her prescriptions called in to [facility] in [place]. Teresa Parks has a CGM. Teresa Parks has a BP cuff/ monitor she does not check her blood pressures on a regular basis.    Arta Bruce Jrue Jarriel RN 01/10/2023 2:42 PM

## 2023-01-10 NOTE — Progress Notes (Signed)
Cardiac Individual Treatment Plan  Patient Details  Name: Teresa Parks MRN: [id number] Date of Birth: [birth year] Referring Provider:   Flowsheet Row INTENSIVE CARDIAC REHAB ORIENT from 01/10/2023 in [facility]  Referring Provider Dr. Tonny Bollman MD       Initial Encounter Date:  Flowsheet Row INTENSIVE CARDIAC REHAB ORIENT from 01/10/2023 in [facility]  Date 01/10/23       Visit Diagnosis: 12/21/22 ST elevation myocardial infarction involving right coronary artery (HCC)  12/21/22 S/P DES Mid RCA  Patient's Home Medications on Admission:  Current Outpatient Medications:    acetaminophen (TYLENOL) 325 MG tablet, Take 325-650 mg by mouth as needed for headache., Disp: , Rfl:    aspirin 81 MG chewable tablet, Chew 1 tablet (81 mg total) by mouth daily., Disp: , Rfl:    cetirizine (ZYRTEC ALLERGY) 10 MG tablet, Take 1 tablet (10 mg total) by mouth daily., Disp: 90 tablet, Rfl: 1   fenofibrate 160 MG tablet, Take 1 tablet (160 mg total) by mouth daily. (Patient taking differently: Take 160 mg by mouth daily in the afternoon.), Disp: 90 tablet, Rfl: 1   glimepiride (AMARYL) 2 MG tablet, Take 2 mg by mouth every morning., Disp: , Rfl:    Glucagon (BAQSIMI ONE PACK) 3 MG/DOSE POWD, Place 1 Dose into the nose as needed (low blood sugar)., Disp: , Rfl:    glucose blood test strip, Use as instructed, Disp: 100 each, Rfl: 12   Icosapent Ethyl (VASCEPA) 0.5 g CAPS, Take 4 capsules by mouth daily. (Patient taking differently: Take 3 capsules by mouth daily.), Disp: 360 capsule, Rfl: 3   insulin aspart (NOVOLOG) 100 UNIT/ML injection, Inject 15-35 Units into the skin 3 (three) times daily before meals., Disp: , Rfl:    insulin degludec (TRESIBA FLEXTOUCH) 200 UNIT/ML FlexTouch Pen, Inject 56 Units into the skin daily. And pen needles 1/day (Patient taking differently: Inject 52  Units into the skin 2 (two) times daily.), Disp: 30 mL, Rfl: 3   Lancets (FREESTYLE) lancets, Use as instructed, Disp: 100 each, Rfl: 12   MOUNJARO 2.5 MG/0.5ML Pen, Inject 2.5 mg into the skin once a week., Disp: , Rfl:    nitroGLYCERIN (NITROSTAT) 0.4 MG SL tablet, Place 1 tablet (0.4 mg total) under the tongue every 5 (five) minutes as needed for chest pain., Disp: 25 tablet, Rfl: 6   fluticasone (FLONASE) 50 MCG/ACT nasal spray, Place 2 sprays into both nostrils daily. (Patient not taking: Reported on 01/10/2023), Disp: 16 g, Rfl: 6   metoprolol succinate (TOPROL-XL) 25 MG 24 hr tablet, Take 1 tablet (25 mg total) by mouth daily., Disp: 90 tablet, Rfl: 3   olmesartan (BENICAR) 40 MG tablet, Take 1 tablet (40 mg total) by mouth daily., Disp: 90 tablet, Rfl: 3   rosuvastatin (CRESTOR) 10 MG tablet, Take 1 tablet (10 mg total) by mouth daily., Disp: 90 tablet, Rfl: 3   ticagrelor (BRILINTA) 90 MG TABS tablet, Take 1 tablet (90 mg total) by mouth 2 (two) times daily., Disp: 180 tablet, Rfl: 3  Past Medical History: Past Medical History:  Diagnosis Date   Acute ST elevation myocardial infarction (STEMI) of inferior wall (HCC) 12/21/2022   Arthritis    Bell's palsy    hx of 15 years ago approx   CAD in native artery with cardiac cath and DES to [facility] 12/21/22 12/22/2022   Carpal tunnel syndrome  Chronic ear infection    Chronic pancreatitis (HCC)    Diabetes (HCC)    Diabetes mellitus type II, controlled (HCC)    Hemorrhoids    HTN (hypertension)    pt reports b/p runs high when goes to MD office   Hyperlipidemia    Obesity    Pancreatitis, acute     Tobacco Use: Social History   Tobacco Use  Smoking Status Every Day   Packs/day: 0.50   Years: 35.00   Additional pack years: 0.00   Total pack years: 17.50   Types: Cigarettes   Start date: 1988  Smokeless Tobacco Never  Tobacco Comments   01/10/23 Referral faxed to [facility] QUITLINE    Labs: Review Flowsheet  More data exists       Latest Ref Rng & Units 08/17/2021 12/07/2021 01/15/2022 04/10/2022 12/22/2022  Labs for ITP Cardiac and Pulmonary Rehab  Cholestrol 0 - 200 mg/dL 366  - - - 440   LDL (calc) 0 - 99 mg/dL - - - - UNABLE TO CALCULATE IF TRIGLYCERIDE OVER 400 mg/dL   Direct LDL 0 - 99 mg/dL 34.7  - - - 45   HDL-C >40 mg/dL 42.59  - - - 23   Trlycerides <150 mg/dL 563.8 Triglyceride is over 400; calculations on Lipids are invalid.  1667.0 Triglyceride is over 400; calculations on Lipids are invalid.  - - 569   Hemoglobin A1c 4.8 - 5.6 % 9.2  - 12.7  7.7  10.8     Capillary Blood Glucose: Lab Results  Component Value Date   GLUCAP 190 (H) 12/22/2022   GLUCAP 145 (H) 12/22/2022   GLUCAP 184 (H) 12/21/2022   GLUCAP 200 (H) 12/21/2022   GLUCAP 187 (H) 12/21/2022     Exercise Target Goals: Exercise Program Goal: Individual exercise prescription set using results from initial 6 min walk test and THRR while considering  patient's activity barriers and safety.   Exercise Prescription Goal: Initial exercise prescription builds to 30-45 minutes a day of aerobic activity, 2-3 days per week.  Home exercise guidelines will be given to patient during program as part of exercise prescription that the participant will acknowledge.  Activity Barriers & Risk Stratification:  Activity Barriers & Cardiac Risk Stratification - 01/10/23 1500       Activity Barriers & Cardiac Risk Stratification   Activity Barriers Deconditioning;Back Problems    Cardiac Risk Stratification High             6 Minute Walk:  6 Minute Walk     Row Name 01/10/23 1455         6 Minute Walk   Phase Initial     Distance 1370 feet     Walk Time 6 minutes     # of Rest Breaks 0     MPH 2.6     METS 3.69     RPE 7     Perceived Dyspnea  0     VO2 Peak 12.9     Symptoms No     Resting HR 77 bpm     Resting BP 134/66     Resting Oxygen Saturation  98 %     Exercise Oxygen Saturation  during 6 min walk 99 %     Max Ex. HR 107  bpm     Max Ex. BP 144/72     2 Minute Post BP 138/68              Oxygen Initial Assessment:  Oxygen Re-Evaluation:   Oxygen Discharge (Final Oxygen Re-Evaluation):   Initial Exercise Prescription:  Initial Exercise Prescription - 01/10/23 1400       Date of Initial Exercise RX and Referring Provider   Date 01/10/23    Referring Provider Dr. Tonny Bollman MD    Expected Discharge Date 03/22/23      Treadmill   MPH 2.3    Grade 0    Minutes 15    METs 2.76      Elliptical   Level 1    Speed 1    Minutes 15    METs 3.2      Prescription Details   Frequency (times per week) 3    Duration Progress to 30 minutes of continuous aerobic without signs/symptoms of physical distress      Intensity   THRR 40-80% of Max Heartrate 67-134    Ratings of Perceived Exertion 11-13    Perceived Dyspnea 0-4      Progression   Progression Continue progressive overload as per policy without signs/symptoms or physical distress.      Resistance Training   Training Prescription Yes    Weight 3    Reps 10-15             Perform Capillary Blood Glucose checks as needed.  Exercise Prescription Changes:   Exercise Comments:   Exercise Goals and Review:   Exercise Goals     Row Name 01/10/23 1501             Exercise Goals   Increase Physical Activity Yes       Intervention Provide advice, education, support and counseling about physical activity/exercise needs.;Develop an individualized exercise prescription for aerobic and resistive training based on initial evaluation findings, risk stratification, comorbidities and participant's personal goals.       Expected Outcomes Short Term: Attend rehab on a regular basis to increase amount of physical activity.;Long Term: Exercising regularly at least 3-5 days a week.;Long Term: Add in home exercise to make exercise part of routine and to increase amount of physical activity.       Increase Strength and Stamina Yes        Intervention Provide advice, education, support and counseling about physical activity/exercise needs.;Develop an individualized exercise prescription for aerobic and resistive training based on initial evaluation findings, risk stratification, comorbidities and participant's personal goals.       Expected Outcomes Short Term: Increase workloads from initial exercise prescription for resistance, speed, and METs.;Short Term: Perform resistance training exercises routinely during rehab and add in resistance training at home;Long Term: Improve cardiorespiratory fitness, muscular endurance and strength as measured by increased METs and functional capacity ( )       Able to understand and use rate of perceived exertion (RPE) scale Yes       Intervention Provide education and explanation on how to use RPE scale       Expected Outcomes Short Term: Able to use RPE daily in rehab to express subjective intensity level;Long Term:  Able to use RPE to guide intensity level when exercising independently       Knowledge and understanding of Target Heart Rate Range (THRR) Yes       Intervention Provide education and explanation of THRR including how the numbers were predicted and where they are located for reference       Expected Outcomes Short Term: Able to state/look up THRR;Long Term: Able to use THRR to govern intensity when exercising independently;Short Term: Able  to use daily as guideline for intensity in rehab       Understanding of Exercise Prescription Yes       Intervention Provide education, explanation, and written materials on patient's individual exercise prescription       Expected Outcomes Short Term: Able to explain program exercise prescription;Long Term: Able to explain home exercise prescription to exercise independently                Exercise Goals Re-Evaluation :   Discharge Exercise Prescription (Final Exercise Prescription Changes):   Nutrition:  Target Goals:  Understanding of nutrition guidelines, daily intake of sodium 1500mg , cholesterol 200mg , calories 30% from fat and 7% or less from saturated fats, daily to have 5 or more servings of fruits and vegetables.  Biometrics:  Pre Biometrics - 01/10/23 1453       Pre Biometrics   Waist Circumference 45 inches    Hip Circumference 47 inches    Waist to Hip Ratio 0.96 %    Triceps Skinfold 9 mm    % Body Fat 39.1 %    Grip Strength 38 kg    Flexibility 12.75 in    Single Leg Stand 24.7 seconds              Nutrition Therapy Plan and Nutrition Goals:   Nutrition Assessments:  MEDIFICTS Score Key: ?70 Need to make dietary changes  40-70 Heart Healthy Diet ? 40 Therapeutic Level Cholesterol Diet    Picture Your Plate Scores: <29 Unhealthy dietary pattern with much room for improvement. 41-50 Dietary pattern unlikely to meet recommendations for good health and room for improvement. 51-60 More healthful dietary pattern, with some room for improvement.  >60 Healthy dietary pattern, although there may be some specific behaviors that could be improved.    Nutrition Goals Re-Evaluation:   Nutrition Goals Re-Evaluation:   Nutrition Goals Discharge (Final Nutrition Goals Re-Evaluation):   Psychosocial: Target Goals: Acknowledge presence or absence of significant depression and/or stress, maximize coping skills, provide positive support system. Participant is able to verbalize types and ability to use techniques and skills needed for reducing stress and depression.  Initial Review & Psychosocial Screening:  Initial Psych Review & Screening - 01/10/23 1530       Initial Review   Current issues with Current Stress Concerns    Source of Stress Concerns Chronic Illness;Occupation    Comments Zenola reports having stress due to her recent hospitalizaiton due to MI/ STENT      Family Dynamics   Good Support System? Yes   Josette has her husband and daughter and coworkers for  support     Barriers   Psychosocial barriers to participate in program The patient should benefit from training in stress management and relaxation.      Screening Interventions   Interventions Encouraged to exercise;To provide support and resources with identified psychosocial needs;Provide feedback about the scores to participant    Expected Outcomes Long Term Goal: Stressors or current issues are controlled or eliminated.;Long Term goal: The participant improves quality of Life and PHQ9 Scores as seen by post scores and/or verbalization of changes             Quality of Life Scores:  Quality of Life - 01/10/23 1503       Quality of Life   Select Quality of Life      Quality of Life Scores   Health/Function Pre 23.67 %    Socioeconomic Pre 22.31 %    Psych/Spiritual  Pre 20.71 %    Family Pre 23.4 %    GLOBAL Pre 22.73 %            Scores of 19 and below usually indicate a poorer quality of life in these areas.  A difference of  2-3 points is a clinically meaningful difference.  A difference of 2-3 points in the total score of the Quality of Life Index has been associated with significant improvement in overall quality of life, self-image, physical symptoms, and general health in studies assessing change in quality of life.  PHQ-9: Review Flowsheet  More data exists      01/10/2023 08/17/2021 04/14/2020 08/22/2018 03/14/2018  Depression screen PHQ 2/9  Decreased Interest 0 0 0 0 0  Down, Depressed, Hopeless 0 0 0 0 0  PHQ - 2 Score 0 0 0 0 0  Altered sleeping 1 - - 0 -  Tired, decreased energy 1 - - 0 -  Change in appetite 1 - - 0 -  Feeling bad or failure about yourself  - - - 0 -  Trouble concentrating 0 - - 0 -  Moving slowly or fidgety/restless 0 - - 0 -  Suicidal thoughts 0 - - 0 -  PHQ-9 Score 3 - - 0 -  Difficult doing work/chores Not difficult at all - - - -   Interpretation of Total Score  Total Score Depression Severity:  1-4 = Minimal depression, 5-9 =  Mild depression, 10-14 = Moderate depression, 15-19 = Moderately severe depression, 20-27 = Severe depression   Psychosocial Evaluation and Intervention:   Psychosocial Re-Evaluation:   Psychosocial Discharge (Final Psychosocial Re-Evaluation):   Vocational Rehabilitation: Provide vocational rehab assistance to qualifying candidates.   Vocational Rehab Evaluation & Intervention:  Vocational Rehab - 01/10/23 1532       Initial Vocational Rehab Evaluation & Intervention   Assessment shows need for Vocational Rehabilitation No   Kindle works full time remotely and does not need vocational rehab at this time            Education: Education Goals: Education classes will be provided on a weekly basis, covering required topics. Participant will state understanding/return demonstration of topics presented.     Core Videos: Exercise    Move It!  Clinical staff conducted group or individual video education with verbal and written material and guidebook.  Patient learns the recommended Pritikin exercise program. Exercise with the goal of living a long, healthy life. Some of the health benefits of exercise include controlled diabetes, healthier blood pressure levels, improved cholesterol levels, improved heart and lung capacity, improved sleep, and better body composition. Everyone should speak with their doctor before starting or changing an exercise routine.  Biomechanical Limitations Clinical staff conducted group or individual video education with verbal and written material and guidebook.  Patient learns how biomechanical limitations can impact exercise and how we can mitigate and possibly overcome limitations to have an impactful and balanced exercise routine.  Body Composition Clinical staff conducted group or individual video education with verbal and written material and guidebook.  Patient learns that body composition (ratio of muscle mass to fat mass) is a key component to  assessing overall fitness, rather than body weight alone. Increased fat mass, especially visceral belly fat, can put [place] at increased risk for metabolic syndrome, type 2 diabetes, heart disease, and even death. It is recommended to combine diet and exercise (cardiovascular and resistance training) to improve your body composition. Seek guidance from your physician and  exercise physiologist before implementing an exercise routine.  Exercise Action Plan Clinical staff conducted group or individual video education with verbal and written material and guidebook.  Patient learns the recommended strategies to achieve and enjoy long-term exercise adherence, including variety, self-motivation, self-efficacy, and positive decision making. Benefits of exercise include fitness, good health, weight management, more energy, better sleep, less stress, and overall well-being.  Medical   Heart Disease Risk Reduction Clinical staff conducted group or individual video education with verbal and written material and guidebook.  Patient learns our heart is our most vital organ as it circulates oxygen, nutrients, white blood cells, and hormones throughout the entire body, and carries waste away. Data supports a plant-based eating plan like the Pritikin Program for its effectiveness in slowing progression of and reversing heart disease. The video provides a number of recommendations to address heart disease.   Metabolic Syndrome and Belly Fat  Clinical staff conducted group or individual video education with verbal and written material and guidebook.  Patient learns what metabolic syndrome is, how it leads to heart disease, and how one can reverse it and keep it from coming back. You have metabolic syndrome if you have 3 of the following 5 criteria: abdominal obesity, high blood pressure, high triglycerides, low HDL cholesterol, and high blood sugar.  Hypertension and Heart Disease Clinical staff conducted group or  individual video education with verbal and written material and guidebook.  Patient learns that high blood pressure, or hypertension, is very common in the [place]. Hypertension is largely due to excessive salt intake, but other important risk factors include being overweight, physical inactivity, drinking too much alcohol, smoking, and not eating enough potassium from fruits and vegetables. High blood pressure is a leading risk factor for heart attack, stroke, congestive heart failure, dementia, kidney failure, and premature death. Long-term effects of excessive salt intake include stiffening of the arteries and thickening of heart muscle and organ damage. Recommendations include ways to reduce hypertension and the risk of heart disease.  Diseases of Our Time - Focusing on Diabetes Clinical staff conducted group or individual video education with verbal and written material and guidebook.  Patient learns why the best way to stop diseases of our time is prevention, through food and other lifestyle changes. Medicine (such as prescription pills and surgeries) is often only a Band-Aid on the problem, not a long-term solution. Most common diseases of our time include obesity, type 2 diabetes, hypertension, heart disease, and cancer. The Pritikin Program is recommended and has been proven to help reduce, reverse, and/or prevent the damaging effects of metabolic syndrome.  Nutrition   Overview of the Pritikin Eating Plan  Clinical staff conducted group or individual video education with verbal and written material and guidebook.  Patient learns about the Pritikin Eating Plan for disease risk reduction. The Pritikin Eating Plan emphasizes a wide variety of unrefined, minimally-processed carbohydrates, like fruits, vegetables, whole grains, and legumes. Go, Caution, and Stop food choices are explained. Plant-based and lean animal proteins are emphasized. Rationale provided for low sodium intake for blood  pressure control, low added sugars for blood sugar stabilization, and low added fats and oils for coronary artery disease risk reduction and weight management.  Calorie Density  Clinical staff conducted group or individual video education with verbal and written material and guidebook.  Patient learns about calorie density and how it impacts the Pritikin Eating Plan. Knowing the characteristics of the food you choose will help you decide whether those foods will lead  to weight gain or weight loss, and whether you want to consume more or less of them. Weight loss is usually a side effect of the Pritikin Eating Plan because of its focus on low calorie-dense foods.  Label Reading  Clinical staff conducted group or individual video education with verbal and written material and guidebook.  Patient learns about the Pritikin recommended label reading guidelines and corresponding recommendations regarding calorie density, added sugars, sodium content, and whole grains.  Dining Out - Part 1  Clinical staff conducted group or individual video education with verbal and written material and guidebook.  Patient learns that restaurant meals can be sabotaging because they can be so high in calories, fat, sodium, and/or sugar. Patient learns recommended strategies on how to positively address this and avoid unhealthy pitfalls.  Facts on Fats  Clinical staff conducted group or individual video education with verbal and written material and guidebook.  Patient learns that lifestyle modifications can be just as effective, if not more so, as many medications for lowering your risk of heart disease. A Pritikin lifestyle can help to reduce your risk of inflammation and atherosclerosis (cholesterol build-up, or plaque, in the artery walls). Lifestyle interventions such as dietary choices and physical activity address the cause of atherosclerosis. A review of the types of fats and their impact on blood cholesterol levels,  along with dietary recommendations to reduce fat intake is also included.  Nutrition Action Plan  Clinical staff conducted group or individual video education with verbal and written material and guidebook.  Patient learns how to incorporate Pritikin recommendations into their lifestyle. Recommendations include planning and keeping personal health goals in mind as an important part of their success.  Healthy Mind-Set    Healthy Minds, Bodies, Hearts  Clinical staff conducted group or individual video education with verbal and written material and guidebook.  Patient learns how to identify when they are stressed. Video will discuss the impact of that stress, as well as the many benefits of stress management. Patient will also be introduced to stress management techniques. The way we think, act, and feel has an impact on our hearts.  How Our Thoughts Can Heal Our Hearts  Clinical staff conducted group or individual video education with verbal and written material and guidebook.  Patient learns that negative thoughts can cause depression and anxiety. This can result in negative lifestyle behavior and serious health problems. Cognitive behavioral therapy is an effective method to help control our thoughts in order to change and improve our emotional outlook.  Additional Videos:  Exercise    Improving Performance  Clinical staff conducted group or individual video education with verbal and written material and guidebook.  Patient learns to use a non-linear approach by alternating intensity levels and lengths of time spent exercising to help burn more calories and lose more body fat. Cardiovascular exercise helps improve heart health, metabolism, hormonal balance, blood sugar control, and recovery from fatigue. Resistance training improves strength, endurance, balance, coordination, reaction time, metabolism, and muscle mass. Flexibility exercise improves circulation, posture, and balance. Seek  guidance from your physician and exercise physiologist before implementing an exercise routine and learn your capabilities and proper form for all exercise.  Introduction to Yoga  Clinical staff conducted group or individual video education with verbal and written material and guidebook.  Patient learns about yoga, a discipline of the coming together of mind, breath, and body. The benefits of yoga include improved flexibility, improved range of motion, better posture and core strength, increased lung  function, weight loss, and positive self-image. Yoga's heart health benefits include lowered blood pressure, healthier heart rate, decreased cholesterol and triglyceride levels, improved immune function, and reduced stress. Seek guidance from your physician and exercise physiologist before implementing an exercise routine and learn your capabilities and proper form for all exercise.  Medical   Aging: Enhancing Your Quality of Life  Clinical staff conducted group or individual video education with verbal and written material and guidebook.  Patient learns key strategies and recommendations to stay in good physical health and enhance quality of life, such as prevention strategies, having an advocate, securing a Health Care Proxy and Power of Attorney, and keeping a list of medications and system for tracking them. It also discusses how to avoid risk for bone loss.  Biology of Weight Control  Clinical staff conducted group or individual video education with verbal and written material and guidebook.  Patient learns that weight gain occurs because we consume more calories than we burn (eating more, moving less). Even if your body weight is normal, you may have higher ratios of fat compared to muscle mass. Too much body fat puts you at increased risk for cardiovascular disease, heart attack, stroke, type 2 diabetes, and obesity-related cancers. In addition to exercise, following the Pritikin Eating Plan can help  reduce your risk.  Decoding Lab Results  Clinical staff conducted group or individual video education with verbal and written material and guidebook.  Patient learns that lab test reflects one measurement whose values change over time and are influenced by many factors, including medication, stress, sleep, exercise, food, hydration, pre-existing medical conditions, and more. It is recommended to use the knowledge from this video to become more involved with your lab results and evaluate your numbers to speak with your doctor.   Diseases of Our Time - Overview  Clinical staff conducted group or individual video education with verbal and written material and guidebook.  Patient learns that according to the CDC, 50% to 70% of chronic diseases (such as obesity, type 2 diabetes, elevated lipids, hypertension, and heart disease) are avoidable through lifestyle improvements including healthier food choices, listening to satiety cues, and increased physical activity.  Sleep Disorders Clinical staff conducted group or individual video education with verbal and written material and guidebook.  Patient learns how good quality and duration of sleep are important to overall health and well-being. Patient also learns about sleep disorders and how they impact health along with recommendations to address them, including discussing with a physician.  Nutrition  Dining Out - Part 2 Clinical staff conducted group or individual video education with verbal and written material and guidebook.  Patient learns how to plan ahead and communicate in order to maximize their dining experience in a healthy and nutritious manner. Included are recommended food choices based on the type of restaurant the patient is visiting.   Fueling a Banker conducted group or individual video education with verbal and written material and guidebook.  There is a strong connection between our food choices and our health.  Diseases like obesity and type 2 diabetes are very prevalent and are in large-part due to lifestyle choices. The Pritikin Eating Plan provides plenty of food and hunger-curbing satisfaction. It is easy to follow, affordable, and helps reduce health risks.  Menu Workshop  Clinical staff conducted group or individual video education with verbal and written material and guidebook.  Patient learns that restaurant meals can sabotage health goals because they are often packed with  calories, fat, sodium, and sugar. Recommendations include strategies to plan ahead and to communicate with the manager, chef, or server to help order a healthier meal.  Planning Your Eating Strategy  Clinical staff conducted group or individual video education with verbal and written material and guidebook.  Patient learns about the Pritikin Eating Plan and its benefit of reducing the risk of disease. The Pritikin Eating Plan does not focus on calories. Instead, it emphasizes high-quality, nutrient-rich foods. By knowing the characteristics of the foods, we choose, we can determine their calorie density and make informed decisions.  Targeting Your Nutrition Priorities  Clinical staff conducted group or individual video education with verbal and written material and guidebook.  Patient learns that lifestyle habits have a tremendous impact on disease risk and progression. This video provides eating and physical activity recommendations based on your personal health goals, such as reducing LDL cholesterol, losing weight, preventing or controlling type 2 diabetes, and reducing high blood pressure.  Vitamins and Minerals  Clinical staff conducted group or individual video education with verbal and written material and guidebook.  Patient learns different ways to obtain key vitamins and minerals, including through a recommended healthy diet. It is important to discuss all supplements you take with your doctor.   Healthy Mind-Set     Smoking Cessation  Clinical staff conducted group or individual video education with verbal and written material and guidebook.  Patient learns that cigarette smoking and tobacco addiction pose a serious health risk which affects millions of people. Stopping smoking will significantly reduce the risk of heart disease, lung disease, and many forms of cancer. Recommended strategies for quitting are covered, including working with your doctor to develop a successful plan.  Culinary   Becoming a Set designer conducted group or individual video education with verbal and written material and guidebook.  Patient learns that cooking at home can be healthy, cost-effective, quick, and puts them in control. Keys to cooking healthy recipes will include looking at your recipe, assessing your equipment needs, planning ahead, making it simple, choosing cost-effective seasonal ingredients, and limiting the use of added fats, salts, and sugars.  Cooking - Breakfast and Snacks  Clinical staff conducted group or individual video education with verbal and written material and guidebook.  Patient learns how important breakfast is to satiety and nutrition through the entire day. Recommendations include key foods to eat during breakfast to help stabilize blood sugar levels and to prevent overeating at meals later in the day. Planning ahead is also a key component.  Cooking - Educational psychologist conducted group or individual video education with verbal and written material and guidebook.  Patient learns eating strategies to improve overall health, including an approach to cook more at home. Recommendations include thinking of animal protein as a side on your plate rather than center stage and focusing instead on lower calorie dense options like vegetables, fruits, whole grains, and plant-based proteins, such as beans. Making sauces in large quantities to freeze for later and leaving the skin  on your vegetables are also recommended to maximize your experience.  Cooking - Healthy Salads and Dressing Clinical staff conducted group or individual video education with verbal and written material and guidebook.  Patient learns that vegetables, fruits, whole grains, and legumes are the foundations of the Pritikin Eating Plan. Recommendations include how to incorporate each of these in flavorful and healthy salads, and how to create homemade salad dressings. Proper handling of ingredients is also  covered. Cooking - Soups and [employer] - Soups and Desserts Clinical staff conducted group or individual video education with verbal and written material and guidebook.  Patient learns that Pritikin soups and desserts make for easy, nutritious, and delicious snacks and meal components that are low in sodium, fat, sugar, and calorie density, while high in vitamins, minerals, and filling fiber. Recommendations include simple and healthy ideas for soups and desserts.   Overview     The Pritikin Solution Program Overview Clinical staff conducted group or individual video education with verbal and written material and guidebook.  Patient learns that the results of the Pritikin Program have been documented in more than 100 articles published in peer-reviewed journals, and the benefits include reducing risk factors for (and, in some cases, even reversing) high cholesterol, high blood pressure, type 2 diabetes, obesity, and more! An overview of the three key pillars of the Pritikin Program will be covered: eating well, doing regular exercise, and having a healthy mind-set.  WORKSHOPS  Exercise: Exercise Basics: Building Your Action Plan Clinical staff led group instruction and group discussion with PowerPoint presentation and patient guidebook. To enhance the learning environment the use of posters, models and videos may be added. At the conclusion of this workshop, patients will comprehend the  difference between physical activity and exercise, as well as the benefits of incorporating both, into their routine. Patients will understand the FITT (Frequency, Intensity, Time, and Type) principle and how to use it to build an exercise action plan. In addition, safety concerns and other considerations for exercise and cardiac rehab will be addressed by the presenter. The purpose of this lesson is to promote a comprehensive and effective weekly exercise routine in order to improve patients' overall level of fitness.   Managing Heart Disease: Your Path to a Healthier Heart Clinical staff led group instruction and group discussion with PowerPoint presentation and patient guidebook. To enhance the learning environment the use of posters, models and videos may be added.At the conclusion of this workshop, patients will understand the anatomy and physiology of the heart. Additionally, they will understand how Pritikin's three pillars impact the risk factors, the progression, and the management of heart disease.  The purpose of this lesson is to provide a high-level overview of the heart, heart disease, and how the Pritikin lifestyle positively impacts risk factors.  Exercise Biomechanics Clinical staff led group instruction and group discussion with PowerPoint presentation and patient guidebook. To enhance the learning environment the use of posters, models and videos may be added. Patients will learn how the structural parts of their bodies function and how these functions impact their daily activities, movement, and exercise. Patients will learn how to promote a neutral spine, learn how to manage pain, and identify ways to improve their physical movement in order to promote healthy living. The purpose of this lesson is to expose patients to common physical limitations that impact physical activity. Participants will learn practical ways to adapt and manage aches and pains, and to minimize their  effect on regular exercise. Patients will learn how to maintain good posture while sitting, walking, and lifting.  Balance Training and Fall Prevention  Clinical staff led group instruction and group discussion with PowerPoint presentation and patient guidebook. To enhance the learning environment the use of posters, models and videos may be added. At the conclusion of this workshop, patients will understand the importance of their sensorimotor skills (vision, proprioception, and the vestibular system) in maintaining their ability to balance as  they age. Patients will apply a variety of balancing exercises that are appropriate for their current level of function. Patients will understand the common causes for poor balance, possible solutions to these problems, and ways to modify their physical environment in order to minimize their fall risk. The purpose of this lesson is to teach patients about the importance of maintaining balance as they age and ways to minimize their risk of falling.  WORKSHOPS   Nutrition:  Fueling a Ship broker led group instruction and group discussion with PowerPoint presentation and patient guidebook. To enhance the learning environment the use of posters, models and videos may be added. Patients will review the foundational principles of the Pritikin Eating Plan and understand what constitutes a serving size in each of the food groups. Patients will also learn Pritikin-friendly foods that are better choices when away from home and review make-ahead meal and snack options. Calorie density will be reviewed and applied to three nutrition priorities: weight maintenance, weight loss, and weight gain. The purpose of this lesson is to reinforce (in a group setting) the key concepts around what patients are recommended to eat and how to apply these guidelines when away from home by planning and selecting Pritikin-friendly options. Patients will understand how calorie  density may be adjusted for different weight management goals.  Mindful Eating  Clinical staff led group instruction and group discussion with PowerPoint presentation and patient guidebook. To enhance the learning environment the use of posters, models and videos may be added. Patients will briefly review the concepts of the Pritikin Eating Plan and the importance of low-calorie dense foods. The concept of mindful eating will be introduced as well as the importance of paying attention to internal hunger signals. Triggers for non-hunger eating and techniques for dealing with triggers will be explored. The purpose of this lesson is to provide patients with the opportunity to review the basic principles of the Pritikin Eating Plan, discuss the value of eating mindfully and how to measure internal cues of hunger and fullness using the Hunger Scale. Patients will also discuss reasons for non-hunger eating and learn strategies to use for controlling emotional eating.  Targeting Your Nutrition Priorities Clinical staff led group instruction and group discussion with PowerPoint presentation and patient guidebook. To enhance the learning environment the use of posters, models and videos may be added. Patients will learn how to determine their genetic susceptibility to disease by reviewing their family history. Patients will gain insight into the importance of diet as part of an overall healthy lifestyle in mitigating the impact of genetics and other environmental insults. The purpose of this lesson is to provide patients with the opportunity to assess their personal nutrition priorities by looking at their family history, their own health history and current risk factors. Patients will also be able to discuss ways of prioritizing and modifying the Pritikin Eating Plan for their highest risk areas  Menu  Clinical staff led group instruction and group discussion with PowerPoint presentation and patient guidebook. To  enhance the learning environment the use of posters, models and videos may be added. Using menus brought in from [employer], or printed from [employer], patients will apply the Pritikin dining out guidelines that were presented in the [employer] video. Patients will also be able to practice these guidelines in a variety of provided scenarios. The purpose of this lesson is to provide patients with the opportunity to practice hands-on learning of the Pritikin Dining Out guidelines  with actual menus and practice scenarios.  Label Reading Clinical staff led group instruction and group discussion with PowerPoint presentation and patient guidebook. To enhance the learning environment the use of posters, models and videos may be added. Patients will review and discuss the Pritikin label reading guidelines presented in Pritikin's Label Reading Educational series video. Using fool labels brought in from local grocery stores and markets, patients will apply the label reading guidelines and determine if the packaged food meet the Pritikin guidelines. The purpose of this lesson is to provide patients with the opportunity to review, discuss, and practice hands-on learning of the Pritikin Label Reading guidelines with actual packaged food labels. Cooking School  Pritikin's [employer] are designed to teach patients ways to prepare quick, simple, and affordable recipes at home. The importance of nutrition's role in chronic disease risk reduction is reflected in its emphasis in the overall Pritikin program. By learning how to prepare essential core Pritikin Eating Plan recipes, patients will increase control over what they eat; be able to customize the flavor of foods without the use of added salt, sugar, or fat; and improve the quality of the food they consume. By learning a set of core recipes which are easily assembled, quickly prepared, and affordable, patients are more likely to  prepare more healthy foods at home. These workshops focus on convenient breakfasts, simple entres, side dishes, and desserts which can be prepared with minimal effort and are consistent with nutrition recommendations for cardiovascular risk reduction. Cooking [employer] are taught by a Armed forces logistics/support/administrative officer (RD) who has been trained by the [employer]. The chef or RD has a clear understanding of the importance of minimizing - if not completely eliminating - added fat, sugar, and sodium in recipes. Throughout the series of Cooking School Workshop sessions, patients will learn about healthy ingredients and efficient methods of cooking to build confidence in their capability to prepare    Cooking School weekly topics:  Adding Flavor- Sodium-Free  Fast and Healthy Breakfasts  Powerhouse Plant-Based Proteins  Satisfying Salads and Dressings  Simple Sides and Sauces  International Cuisine-Spotlight on the [employer] Zones  Delicious Desserts  Savory Soups  [employer] - Meals in a Astronomer Appetizers and Snacks  Comforting Weekend Breakfasts  One-Pot Wonders   Fast Evening Meals  Landscape architect Your Pritikin Plate  WORKSHOPS   Healthy Mindset (Psychosocial):  Focused Goals, Sustainable Changes Clinical staff led group instruction and group discussion with PowerPoint presentation and patient guidebook. To enhance the learning environment the use of posters, models and videos may be added. Patients will be able to apply effective goal setting strategies to establish at least one personal goal, and then take consistent, meaningful action toward that goal. They will learn to identify common barriers to achieving personal goals and develop strategies to overcome them. Patients will also gain an understanding of how our mind-set can impact our ability to achieve goals and the importance of cultivating a positive and growth-oriented mind-set. The purpose  of this lesson is to provide patients with a deeper understanding of how to set and achieve personal goals, as well as the tools and strategies needed to overcome common obstacles which may arise along the way.  From Head to Heart: The Power of a Healthy Outlook  Clinical staff led group instruction and group discussion with PowerPoint presentation and patient guidebook. To enhance the learning environment the use of posters, models and videos may be added.  Patients will be able to recognize and describe the impact of emotions and mood on physical health. They will discover the importance of self-care and explore self-care practices which may work for them. Patients will also learn how to utilize the 4 C's to cultivate a healthier outlook and better manage stress and challenges. The purpose of this lesson is to demonstrate to patients how a healthy outlook is an essential part of maintaining good health, especially as they continue their cardiac rehab journey.  Healthy Sleep for a Healthy Heart Clinical staff led group instruction and group discussion with PowerPoint presentation and patient guidebook. To enhance the learning environment the use of posters, models and videos may be added. At the conclusion of this workshop, patients will be able to demonstrate knowledge of the importance of sleep to overall health, well-being, and quality of life. They will understand the symptoms of, and treatments for, common sleep disorders. Patients will also be able to identify daytime and nighttime behaviors which impact sleep, and they will be able to apply these tools to help manage sleep-related challenges. The purpose of this lesson is to provide patients with a general overview of sleep and outline the importance of quality sleep. Patients will learn about a few of the most common sleep disorders. Patients will also be introduced to the concept of "sleep hygiene," and discover ways to self-manage certain sleeping  problems through simple daily behavior changes. Finally, the workshop will motivate patients by clarifying the links between quality sleep and their goals of heart-healthy living.   Recognizing and Reducing Stress Clinical staff led group instruction and group discussion with PowerPoint presentation and patient guidebook. To enhance the learning environment the use of posters, models and videos may be added. At the conclusion of this workshop, patients will be able to understand the types of stress reactions, differentiate between acute and chronic stress, and recognize the impact that chronic stress has on their health. They will also be able to apply different coping mechanisms, such as reframing negative self-talk. Patients will have the opportunity to practice a variety of stress management techniques, such as deep abdominal breathing, progressive muscle relaxation, and/or guided imagery.  The purpose of this lesson is to educate patients on the role of stress in their lives and to provide healthy techniques for coping with it.  Learning Barriers/Preferences:  Learning Barriers/Preferences - 01/10/23 1503       Learning Barriers/Preferences   Learning Barriers None    Learning Preferences Group Instruction;Individual Instruction;Skilled Demonstration             Education Topics:  Knowledge Questionnaire Score:  Knowledge Questionnaire Score - 01/10/23 1504       Knowledge Questionnaire Score   Pre Score 19/24             Core Components/Risk Factors/Patient Goals at Admission:  Personal Goals and Risk Factors at Admission - 01/10/23 1504       Core Components/Risk Factors/Patient Goals on Admission    Weight Management Yes;Obesity;Weight Loss    Intervention Weight Management: Develop a combined nutrition and exercise program designed to reach desired caloric intake, while maintaining appropriate intake of nutrient and fiber, sodium and fats, and appropriate energy  expenditure required for the weight goal.;Weight Management: Provide education and appropriate resources to help participant work on and attain dietary goals.;Weight Management/Obesity: Establish reasonable short term and long term weight goals.;Obesity: Provide education and appropriate resources to help participant work on and attain dietary goals.    Admit  Weight 212 lb 11.9 oz (96.5 kg)    Expected Outcomes Short Term: Continue to assess and modify interventions until short term weight is achieved;Long Term: Adherence to nutrition and physical activity/exercise program aimed toward attainment of established weight goal;Weight Loss: Understanding of general recommendations for a balanced deficit meal plan, which promotes 1-2 lb weight loss per week and includes a negative energy balance of [phone] kcal/d;Understanding recommendations for meals to include 15-35% energy as protein, 25-35% energy from fat, 35-60% energy from carbohydrates, less than 200mg  of dietary cholesterol, 20-35 gm of total fiber daily;Understanding of distribution of calorie intake throughout the day with the consumption of 4-5 meals/snacks    Tobacco Cessation Yes    Number of packs per day 1/2 PPD   Referral faxed to [facility] Quitline. Patient says she is ready to quit and has cut back   Intervention Assist the participant in steps to quit. Provide individualized education and counseling about committing to Tobacco Cessation, relapse prevention, and pharmacological support that can be provided by physician.;Education officer, environmental, assist with locating and accessing local/national Quit Smoking programs, and support quit date choice.    Expected Outcomes Short Term: Will demonstrate readiness to quit, by selecting a quit date.;Short Term: Will quit all tobacco product use, adhering to prevention of relapse plan.;Long Term: Complete abstinence from all tobacco products for at least 12 months from quit date.    Diabetes Yes     Intervention Provide education about signs/symptoms and action to take for hypo/hyperglycemia.;Provide education about proper nutrition, including hydration, and aerobic/resistive exercise prescription along with prescribed medications to achieve blood glucose in normal ranges: Fasting glucose 65-99 mg/dL    Expected Outcomes Short Term: Participant verbalizes understanding of the signs/symptoms and immediate care of hyper/hypoglycemia, proper foot care and importance of medication, aerobic/resistive exercise and nutrition plan for blood glucose control.;Long Term: Attainment of HbA1C < 7%.    Hypertension Yes    Intervention Monitor prescription use compliance.;Provide education on lifestyle modifcations including regular physical activity/exercise, weight management, moderate sodium restriction and increased consumption of fresh fruit, vegetables, and low fat dairy, alcohol moderation, and smoking cessation.    Expected Outcomes Short Term: Continued assessment and intervention until BP is < 140/55mm HG in hypertensive participants. < 130/4mm HG in hypertensive participants with diabetes, heart failure or chronic kidney disease.;Long Term: Maintenance of blood pressure at goal levels.    Lipids Yes    Intervention Provide education and support for participant on nutrition & aerobic/resistive exercise along with prescribed medications to achieve LDL 70mg , HDL >40mg .    Expected Outcomes Short Term: Participant states understanding of desired cholesterol values and is compliant with medications prescribed. Participant is following exercise prescription and nutrition guidelines.;Long Term: Cholesterol controlled with medications as prescribed, with individualized exercise RX and with personalized nutrition plan. Value goals: LDL < 70mg , HDL > 40 mg.    Stress Yes    Intervention Offer individual and/or small group education and counseling on adjustment to heart disease, stress management and health-related  lifestyle change. Teach and support self-help strategies.;Refer participants experiencing significant psychosocial distress to appropriate mental health specialists for further evaluation and treatment. When possible, include family members and significant others in education/counseling sessions.    Expected Outcomes Short Term: Participant demonstrates changes in health-related behavior, relaxation and other stress management skills, ability to obtain effective social support, and compliance with psychotropic medications if prescribed.;Long Term: Emotional wellbeing is indicated by absence of clinically significant psychosocial distress or social isolation.  Personal Goal Other Yes    Personal Goal Short term:modification with soda drinking Long term: stamina wt loss (60 lbs)    Intervention Will continue to monitor pt and progress workloads as tolerated without sign or symptom    Expected Outcomes Pt will achieve her goals             Core Components/Risk Factors/Patient Goals Review:    Core Components/Risk Factors/Patient Goals at Discharge (Final Review):    ITP Comments:  ITP Comments     Row Name 01/10/23 1323           ITP Comments Armanda Magic, MD:  Medical Director.  Introduction to the [facility] / Intensive Cardiac Rehab.  Initial orientation packet reviewed with the patient.                Comments: Participant attended orientation for the cardiac rehabilitation program on  01/10/2023  to perform initial intake and exercise walk test. Patient introduced to the [facility] education and orientation packet was reviewed. Completed 6-minute walk test, measurements, initial ITP, and exercise prescription. Vital signs stable. CBG 270 via CGM Telemetry-normal sinus rhythm, asymptomatic.Thayer Headings RN BSN   Service time was from 1305 to 1500.

## 2023-01-14 ENCOUNTER — Encounter (HOSPITAL_COMMUNITY): Payer: BC Managed Care – PPO

## 2023-01-14 ENCOUNTER — Encounter (HOSPITAL_COMMUNITY)
Admission: RE | Admit: 2023-01-14 | Discharge: 2023-01-14 | Disposition: A | Discharge status: Home / Self Care | Payer: BC Managed Care – PPO | Source: Ambulatory Visit | Attending: Cardiovascular Disease | Admitting: Cardiovascular Disease

## 2023-01-14 DIAGNOSIS — Z955 Presence of coronary angioplasty implant and graft: Secondary | ICD-10-CM | POA: Insufficient documentation

## 2023-01-14 DIAGNOSIS — I2111 ST elevation (STEMI) myocardial infarction involving right coronary artery: Secondary | ICD-10-CM | POA: Insufficient documentation

## 2023-01-14 DIAGNOSIS — E162 Hypoglycemia, unspecified: Secondary | ICD-10-CM | POA: Insufficient documentation

## 2023-01-14 DIAGNOSIS — Z48812 Encounter for surgical aftercare following surgery on the circulatory system: Secondary | ICD-10-CM | POA: Insufficient documentation

## 2023-01-14 LAB — GLUCOSE, CAPILLARY
Glucose-Capillary: 67 mg/dL — ABNORMAL LOW (ref 70–99)
Glucose-Capillary: 86 mg/dL (ref 70–99)

## 2023-01-14 NOTE — Progress Notes (Signed)
Incomplete Session Note  Patient Details  Name: FRANTASIA COULIBALY MRN: [id number] Date of Birth: Aug 17, 1970 Referring Provider:   Flowsheet Row INTENSIVE CARDIAC REHAB ORIENT from 01/10/2023 in [facility]  Referring Provider Dr. Tonny Bollman MD       [name] did not complete her rehab session.  CBG 67. Cadi took 35 units of novolog at 12:00 noon patient said that she ate lunch. Jemmie said she ate mashed potatoes and [place]. CBG was noted to be dropping lowest number noted was 62 and trending down. Patient drank 2 orange juices during education class given by the dietitian. Patient was asymptomatic. CBG 67 via hospital meter. Glucose gel given. Repeat CBG 86. Patient was also given 10 ounces of ginger ale. CBG was trending up and went up to 117 and 133. Will notify [facility]'s endocrinologist. No exercise due to low CBG. Dennie Maizes NP office notified spoke with [place] Ms Remer Macho will review today's events and contact the patient with further instructions. Valeta plans to return to exercise on Wednesday.Thayer Headings RN BSN

## 2023-01-14 NOTE — Progress Notes (Signed)
Cardiac Individual Treatment Plan  Patient Details  Name: Teresa Parks MRN: [id number] Date of Birth: 09/10/1970 Referring Provider:   Flowsheet Row INTENSIVE CARDIAC REHAB ORIENT from 01/10/2023 in [facility]  Referring Provider Dr. Tonny Bollman MD       Initial Encounter Date:  Flowsheet Row INTENSIVE CARDIAC REHAB ORIENT from 01/10/2023 in [facility]  Date 01/10/23       Visit Diagnosis: 12/21/22 ST elevation myocardial infarction involving right coronary artery (HCC)  12/21/22 S/P DES Mid RCA  Hypoglycemia - Plan: POCT CBG, POCT CBG, POCT CBG, POCT CBG  Patient's Home Medications on Admission:  Current Outpatient Medications:    acetaminophen (TYLENOL) 325 MG tablet, Take 325-650 mg by mouth as needed for headache., Disp: , Rfl:    aspirin 81 MG chewable tablet, Chew 1 tablet (81 mg total) by mouth daily., Disp: , Rfl:    fenofibrate 160 MG tablet, Take 1 tablet (160 mg total) by mouth daily. (Patient taking differently: Take 160 mg by mouth daily in the afternoon.), Disp: 90 tablet, Rfl: 1   glimepiride (AMARYL) 2 MG tablet, Take 2 mg by mouth every morning., Disp: , Rfl:    Glucagon (BAQSIMI ONE PACK) 3 MG/DOSE POWD, Place 1 Dose into the nose as needed (low blood sugar)., Disp: , Rfl:    glucose blood test strip, Use as instructed, Disp: 100 each, Rfl: 12   Icosapent Ethyl (VASCEPA) 0.5 g CAPS, Take 4 capsules by mouth daily. (Patient taking differently: Take 3 capsules by mouth daily.), Disp: 360 capsule, Rfl: 3   insulin aspart (NOVOLOG) 100 UNIT/ML injection, Inject 15-35 Units into the skin 3 (three) times daily before meals., Disp: , Rfl:    insulin degludec (TRESIBA FLEXTOUCH) 200 UNIT/ML FlexTouch Pen, Inject 56 Units into the skin daily. And pen needles 1/day (Patient taking differently: Inject 52 Units into the skin 2 (two) times daily.), Disp: 30 mL,  Rfl: 3   Lancets (FREESTYLE) lancets, Use as instructed, Disp: 100 each, Rfl: 12   metoprolol succinate (TOPROL-XL) 25 MG 24 hr tablet, Take 1 tablet (25 mg total) by mouth daily., Disp: 90 tablet, Rfl: 3   MOUNJARO 2.5 MG/0.5ML Pen, Inject 2.5 mg into the skin once a week., Disp: , Rfl:    nitroGLYCERIN (NITROSTAT) 0.4 MG SL tablet, Place 1 tablet (0.4 mg total) under the tongue every 5 (five) minutes as needed for chest pain., Disp: 25 tablet, Rfl: 6   olmesartan (BENICAR) 40 MG tablet, Take 1 tablet (40 mg total) by mouth daily., Disp: 90 tablet, Rfl: 3   rosuvastatin (CRESTOR) 10 MG tablet, Take 1 tablet (10 mg total) by mouth daily., Disp: 90 tablet, Rfl: 3   ticagrelor (BRILINTA) 90 MG TABS tablet, Take 1 tablet (90 mg total) by mouth 2 (two) times daily., Disp: 180 tablet, Rfl: 3   cetirizine (ZYRTEC ALLERGY) 10 MG tablet, Take 1 tablet (10 mg total) by mouth daily. (Patient not taking: Reported on 01/14/2023), Disp: 90 tablet, Rfl: 1   fluticasone (FLONASE) 50 MCG/ACT nasal spray, Place 2 sprays into both nostrils daily. (Patient not taking: Reported on 01/10/2023), Disp: 16 g, Rfl: 6  Past Medical History: Past Medical History:  Diagnosis Date   Acute ST elevation myocardial infarction (STEMI) of inferior wall (HCC) 12/21/2022   Arthritis    Bell's palsy    hx of 15 years ago approx   CAD  in native artery with cardiac cath and DES to [facility] 12/21/22 12/22/2022   Carpal tunnel syndrome    Chronic ear infection    Chronic pancreatitis (HCC)    Diabetes (HCC)    Diabetes mellitus type II, controlled (HCC)    Hemorrhoids    HTN (hypertension)    pt reports b/p runs high when goes to MD office   Hyperlipidemia    Obesity    Pancreatitis, acute     Tobacco Use: Social History   Tobacco Use  Smoking Status Every Day   Packs/day: 0.50   Years: 35.00   Additional pack years: 0.00   Total pack years: 17.50   Types: Cigarettes   Start date: 1988  Smokeless Tobacco Never  Tobacco  Comments   01/10/23 Referral faxed to [facility] QUITLINE    Labs: Review Flowsheet  More data exists      Latest Ref Rng & Units 08/17/2021 12/07/2021 01/15/2022 04/10/2022 12/22/2022  Labs for ITP Cardiac and Pulmonary Rehab  Cholestrol 0 - 200 mg/dL 161  - - - 096   LDL (calc) 0 - 99 mg/dL - - - - UNABLE TO CALCULATE IF TRIGLYCERIDE OVER 400 mg/dL   Direct LDL 0 - 99 mg/dL 04.5  - - - 45   HDL-C >40 mg/dL 40.98  - - - 23   Trlycerides <150 mg/dL 119.1 Triglyceride is over 400; calculations on Lipids are invalid.  1667.0 Triglyceride is over 400; calculations on Lipids are invalid.  - - 569   Hemoglobin A1c 4.8 - 5.6 % 9.2  - 12.7  7.7  10.8     Capillary Blood Glucose: Lab Results  Component Value Date   GLUCAP 86 01/14/2023   GLUCAP 67 (L) 01/14/2023   GLUCAP 190 (H) 12/22/2022   GLUCAP 145 (H) 12/22/2022   GLUCAP 184 (H) 12/21/2022     Exercise Target Goals: Exercise Program Goal: Individual exercise prescription set using results from initial 6 min walk test and THRR while considering  patient's activity barriers and safety.   Exercise Prescription Goal: Initial exercise prescription builds to 30-45 minutes a day of aerobic activity, 2-3 days per week.  Home exercise guidelines will be given to patient during program as part of exercise prescription that the participant will acknowledge.  Activity Barriers & Risk Stratification:  Activity Barriers & Cardiac Risk Stratification - 01/10/23 1500       Activity Barriers & Cardiac Risk Stratification   Activity Barriers Deconditioning;Back Problems    Cardiac Risk Stratification High             6 Minute Walk:  6 Minute Walk     Row Name 01/10/23 1455         6 Minute Walk   Phase Initial  CBG Pre 270 via personal CGM     Distance 1370 feet     Walk Time 6 minutes     # of Rest Breaks 0     MPH 2.6     METS 3.69     RPE 7     Perceived Dyspnea  0     VO2 Peak 12.9     Symptoms No     Resting HR 77 bpm      Resting BP 134/66     Resting Oxygen Saturation  98 %     Exercise Oxygen Saturation  during 6 min walk 99 %     Max Ex. HR 107 bpm     Max Ex.  BP 144/72     2 Minute Post BP 138/68              Oxygen Initial Assessment:   Oxygen Re-Evaluation:   Oxygen Discharge (Final Oxygen Re-Evaluation):   Initial Exercise Prescription:  Initial Exercise Prescription - 01/10/23 1400       Date of Initial Exercise RX and Referring Provider   Date 01/10/23    Referring Provider Dr. Tonny Bollman MD    Expected Discharge Date 03/22/23      Treadmill   MPH 2.3    Grade 0    Minutes 15    METs 2.76      Elliptical   Level 1    Speed 1    Minutes 15    METs 3.2      Prescription Details   Frequency (times per week) 3    Duration Progress to 30 minutes of continuous aerobic without signs/symptoms of physical distress      Intensity   THRR 40-80% of Max Heartrate 67-134    Ratings of Perceived Exertion 11-13    Perceived Dyspnea 0-4      Progression   Progression Continue progressive overload as per policy without signs/symptoms or physical distress.      Resistance Training   Training Prescription Yes    Weight 3    Reps 10-15             Perform Capillary Blood Glucose checks as needed.  Exercise Prescription Changes:   Exercise Comments:   Exercise Comments     Row Name 01/14/23 1627           Exercise Comments Pt first day in teh CRP2 program. Pt was unable to exercise today due to low blood sugar, but was able to chat with the RD. Will update exercise changes on patients next visit.                Exercise Goals and Review:   Exercise Goals     Row Name 01/10/23 1501             Exercise Goals   Increase Physical Activity Yes       Intervention Provide advice, education, support and counseling about physical activity/exercise needs.;Develop an individualized exercise prescription for aerobic and resistive training based on initial  evaluation findings, risk stratification, comorbidities and participant's personal goals.       Expected Outcomes Short Term: Attend rehab on a regular basis to increase amount of physical activity.;Long Term: Exercising regularly at least 3-5 days a week.;Long Term: Add in home exercise to make exercise part of routine and to increase amount of physical activity.       Increase Strength and Stamina Yes       Intervention Provide advice, education, support and counseling about physical activity/exercise needs.;Develop an individualized exercise prescription for aerobic and resistive training based on initial evaluation findings, risk stratification, comorbidities and participant's personal goals.       Expected Outcomes Short Term: Increase workloads from initial exercise prescription for resistance, speed, and METs.;Short Term: Perform resistance training exercises routinely during rehab and add in resistance training at home;Long Term: Improve cardiorespiratory fitness, muscular endurance and strength as measured by increased METs and functional capacity ( )       Able to understand and use rate of perceived exertion (RPE) scale Yes       Intervention Provide education and explanation on how to use RPE scale  Expected Outcomes Short Term: Able to use RPE daily in rehab to express subjective intensity level;Long Term:  Able to use RPE to guide intensity level when exercising independently       Knowledge and understanding of Target Heart Rate Range (THRR) Yes       Intervention Provide education and explanation of THRR including how the numbers were predicted and where they are located for reference       Expected Outcomes Short Term: Able to state/look up THRR;Long Term: Able to use THRR to govern intensity when exercising independently;Short Term: Able to use daily as guideline for intensity in rehab       Understanding of Exercise Prescription Yes       Intervention Provide education,  explanation, and written materials on patient's individual exercise prescription       Expected Outcomes Short Term: Able to explain program exercise prescription;Long Term: Able to explain home exercise prescription to exercise independently                Exercise Goals Re-Evaluation :   Discharge Exercise Prescription (Final Exercise Prescription Changes):   Nutrition:  Target Goals: Understanding of nutrition guidelines, daily intake of sodium 1500mg , cholesterol 200mg , calories 30% from fat and 7% or less from saturated fats, daily to have 5 or more servings of fruits and vegetables.  Biometrics:  Pre Biometrics - 01/10/23 1453       Pre Biometrics   Waist Circumference 45 inches    Hip Circumference 47 inches    Waist to Hip Ratio 0.96 %    Triceps Skinfold 9 mm    % Body Fat 39.1 %    Grip Strength 38 kg    Flexibility 12.75 in    Single Leg Stand 24.7 seconds              Nutrition Therapy Plan and Nutrition Goals:   Nutrition Assessments:  MEDIFICTS Score Key: ?70 Need to make dietary changes  40-70 Heart Healthy Diet ? 40 Therapeutic Level Cholesterol Diet    Picture Your Plate Scores: <04 Unhealthy dietary pattern with much room for improvement. 41-50 Dietary pattern unlikely to meet recommendations for good health and room for improvement. 51-60 More healthful dietary pattern, with some room for improvement.  >60 Healthy dietary pattern, although there may be some specific behaviors that could be improved.    Nutrition Goals Re-Evaluation:   Nutrition Goals Re-Evaluation:   Nutrition Goals Discharge (Final Nutrition Goals Re-Evaluation):   Psychosocial: Target Goals: Acknowledge presence or absence of significant depression and/or stress, maximize coping skills, provide positive support system. Participant is able to verbalize types and ability to use techniques and skills needed for reducing stress and depression.  Initial Review &  Psychosocial Screening:  Initial Psych Review & Screening - 01/10/23 1530       Initial Review   Current issues with Current Stress Concerns    Source of Stress Concerns Chronic Illness;Occupation    Comments Antoinette reports having stress due to her recent hospitalizaiton due to MI/ STENT      Family Dynamics   Good Support System? Yes   Dylin has her husband and daughter and coworkers for support     Barriers   Psychosocial barriers to participate in program The patient should benefit from training in stress management and relaxation.      Screening Interventions   Interventions Encouraged to exercise;To provide support and resources with identified psychosocial needs;Provide feedback about the scores to participant  Expected Outcomes Long Term Goal: Stressors or current issues are controlled or eliminated.;Long Term goal: The participant improves quality of Life and PHQ9 Scores as seen by post scores and/or verbalization of changes             Quality of Life Scores:  Quality of Life - 01/10/23 1503       Quality of Life   Select Quality of Life      Quality of Life Scores   Health/Function Pre 23.67 %    Socioeconomic Pre 22.31 %    Psych/Spiritual Pre 20.71 %    Family Pre 23.4 %    GLOBAL Pre 22.73 %            Scores of 19 and below usually indicate a poorer quality of life in these areas.  A difference of  2-3 points is a clinically meaningful difference.  A difference of 2-3 points in the total score of the Quality of Life Index has been associated with significant improvement in overall quality of life, self-image, physical symptoms, and general health in studies assessing change in quality of life.  PHQ-9: Review Flowsheet  More data exists      01/10/2023 08/17/2021 04/14/2020 08/22/2018 03/14/2018  Depression screen PHQ 2/9  Decreased Interest 0 0 0 0 0  Down, Depressed, Hopeless 0 0 0 0 0  PHQ - 2 Score 0 0 0 0 0  Altered sleeping 1 - - 0 -  Tired,  decreased energy 1 - - 0 -  Change in appetite 1 - - 0 -  Feeling bad or failure about yourself  - - - 0 -  Trouble concentrating 0 - - 0 -  Moving slowly or fidgety/restless 0 - - 0 -  Suicidal thoughts 0 - - 0 -  PHQ-9 Score 3 - - 0 -  Difficult doing work/chores Not difficult at all - - - -   Interpretation of Total Score  Total Score Depression Severity:  1-4 = Minimal depression, 5-9 = Mild depression, 10-14 = Moderate depression, 15-19 = Moderately severe depression, 20-27 = Severe depression   Psychosocial Evaluation and Intervention:   Psychosocial Re-Evaluation:   Psychosocial Discharge (Final Psychosocial Re-Evaluation):   Vocational Rehabilitation: Provide vocational rehab assistance to qualifying candidates.   Vocational Rehab Evaluation & Intervention:  Vocational Rehab - 01/10/23 1532       Initial Vocational Rehab Evaluation & Intervention   Assessment shows need for Vocational Rehabilitation No   Charma works full time remotely and does not need vocational rehab at this time            Education: Education Goals: Education classes will be provided on a weekly basis, covering required topics. Participant will state understanding/return demonstration of topics presented.    Education     Row Name 01/14/23 1500     Education   Cardiac Education Topics Pritikin   Licensed conveyancer Nutrition   Nutrition Calorie Density   Instruction Review Code 1- Verbalizes Understanding   Class Start Time 1400   Class Stop Time 1445   Class Time Calculation (min) 45 min            Core Videos: Exercise    Move It!  Clinical staff conducted group or individual video education with verbal and written material and guidebook.  Patient learns the recommended Pritikin exercise program. Exercise with the goal of living a long,  healthy life. Some of the health benefits of exercise include controlled diabetes,  healthier blood pressure levels, improved cholesterol levels, improved heart and lung capacity, improved sleep, and better body composition. Everyone should speak with their doctor before starting or changing an exercise routine.  Biomechanical Limitations Clinical staff conducted group or individual video education with verbal and written material and guidebook.  Patient learns how biomechanical limitations can impact exercise and how we can mitigate and possibly overcome limitations to have an impactful and balanced exercise routine.  Body Composition Clinical staff conducted group or individual video education with verbal and written material and guidebook.  Patient learns that body composition (ratio of muscle mass to fat mass) is a key component to assessing overall fitness, rather than body weight alone. Increased fat mass, especially visceral belly fat, can put [place] at increased risk for metabolic syndrome, type 2 diabetes, heart disease, and even death. It is recommended to combine diet and exercise (cardiovascular and resistance training) to improve your body composition. Seek guidance from your physician and exercise physiologist before implementing an exercise routine.  Exercise Action Plan Clinical staff conducted group or individual video education with verbal and written material and guidebook.  Patient learns the recommended strategies to achieve and enjoy long-term exercise adherence, including variety, self-motivation, self-efficacy, and positive decision making. Benefits of exercise include fitness, good health, weight management, more energy, better sleep, less stress, and overall well-being.  Medical   Heart Disease Risk Reduction Clinical staff conducted group or individual video education with verbal and written material and guidebook.  Patient learns our heart is our most vital organ as it circulates oxygen, nutrients, white blood cells, and hormones throughout the entire body,  and carries waste away. Data supports a plant-based eating plan like the Pritikin Program for its effectiveness in slowing progression of and reversing heart disease. The video provides a number of recommendations to address heart disease.   Metabolic Syndrome and Belly Fat  Clinical staff conducted group or individual video education with verbal and written material and guidebook.  Patient learns what metabolic syndrome is, how it leads to heart disease, and how one can reverse it and keep it from coming back. You have metabolic syndrome if you have 3 of the following 5 criteria: abdominal obesity, high blood pressure, high triglycerides, low HDL cholesterol, and high blood sugar.  Hypertension and Heart Disease Clinical staff conducted group or individual video education with verbal and written material and guidebook.  Patient learns that high blood pressure, or hypertension, is very common in the [place]. Hypertension is largely due to excessive salt intake, but other important risk factors include being overweight, physical inactivity, drinking too much alcohol, smoking, and not eating enough potassium from fruits and vegetables. High blood pressure is a leading risk factor for heart attack, stroke, congestive heart failure, dementia, kidney failure, and premature death. Long-term effects of excessive salt intake include stiffening of the arteries and thickening of heart muscle and organ damage. Recommendations include ways to reduce hypertension and the risk of heart disease.  Diseases of Our Time - Focusing on Diabetes Clinical staff conducted group or individual video education with verbal and written material and guidebook.  Patient learns why the best way to stop diseases of our time is prevention, through food and other lifestyle changes. Medicine (such as prescription pills and surgeries) is often only a Band-Aid on the problem, not a long-term solution. Most common diseases of our time  include obesity, type 2 diabetes, hypertension,  heart disease, and cancer. The Pritikin Program is recommended and has been proven to help reduce, reverse, and/or prevent the damaging effects of metabolic syndrome.  Nutrition   Overview of the Pritikin Eating Plan  Clinical staff conducted group or individual video education with verbal and written material and guidebook.  Patient learns about the Pritikin Eating Plan for disease risk reduction. The Pritikin Eating Plan emphasizes a wide variety of unrefined, minimally-processed carbohydrates, like fruits, vegetables, whole grains, and legumes. Go, Caution, and Stop food choices are explained. Plant-based and lean animal proteins are emphasized. Rationale provided for low sodium intake for blood pressure control, low added sugars for blood sugar stabilization, and low added fats and oils for coronary artery disease risk reduction and weight management.  Calorie Density  Clinical staff conducted group or individual video education with verbal and written material and guidebook.  Patient learns about calorie density and how it impacts the Pritikin Eating Plan. Knowing the characteristics of the food you choose will help you decide whether those foods will lead to weight gain or weight loss, and whether you want to consume more or less of them. Weight loss is usually a side effect of the Pritikin Eating Plan because of its focus on low calorie-dense foods.  Label Reading  Clinical staff conducted group or individual video education with verbal and written material and guidebook.  Patient learns about the Pritikin recommended label reading guidelines and corresponding recommendations regarding calorie density, added sugars, sodium content, and whole grains.  Dining Out - Part 1  Clinical staff conducted group or individual video education with verbal and written material and guidebook.  Patient learns that restaurant meals can be sabotaging because they  can be so high in calories, fat, sodium, and/or sugar. Patient learns recommended strategies on how to positively address this and avoid unhealthy pitfalls.  Facts on Fats  Clinical staff conducted group or individual video education with verbal and written material and guidebook.  Patient learns that lifestyle modifications can be just as effective, if not more so, as many medications for lowering your risk of heart disease. A Pritikin lifestyle can help to reduce your risk of inflammation and atherosclerosis (cholesterol build-up, or plaque, in the artery walls). Lifestyle interventions such as dietary choices and physical activity address the cause of atherosclerosis. A review of the types of fats and their impact on blood cholesterol levels, along with dietary recommendations to reduce fat intake is also included.  Nutrition Action Plan  Clinical staff conducted group or individual video education with verbal and written material and guidebook.  Patient learns how to incorporate Pritikin recommendations into their lifestyle. Recommendations include planning and keeping personal health goals in mind as an important part of their success.  Healthy Mind-Set    Healthy Minds, Bodies, Hearts  Clinical staff conducted group or individual video education with verbal and written material and guidebook.  Patient learns how to identify when they are stressed. Video will discuss the impact of that stress, as well as the many benefits of stress management. Patient will also be introduced to stress management techniques. The way we think, act, and feel has an impact on our hearts.  How Our Thoughts Can Heal Our Hearts  Clinical staff conducted group or individual video education with verbal and written material and guidebook.  Patient learns that negative thoughts can cause depression and anxiety. This can result in negative lifestyle behavior and serious health problems. Cognitive behavioral therapy is an  effective method to help  control our thoughts in order to change and improve our emotional outlook.  Additional Videos:  Exercise    Improving Performance  Clinical staff conducted group or individual video education with verbal and written material and guidebook.  Patient learns to use a non-linear approach by alternating intensity levels and lengths of time spent exercising to help burn more calories and lose more body fat. Cardiovascular exercise helps improve heart health, metabolism, hormonal balance, blood sugar control, and recovery from fatigue. Resistance training improves strength, endurance, balance, coordination, reaction time, metabolism, and muscle mass. Flexibility exercise improves circulation, posture, and balance. Seek guidance from your physician and exercise physiologist before implementing an exercise routine and learn your capabilities and proper form for all exercise.  Introduction to Yoga  Clinical staff conducted group or individual video education with verbal and written material and guidebook.  Patient learns about yoga, a discipline of the coming together of mind, breath, and body. The benefits of yoga include improved flexibility, improved range of motion, better posture and core strength, increased lung function, weight loss, and positive self-image. Yoga's heart health benefits include lowered blood pressure, healthier heart rate, decreased cholesterol and triglyceride levels, improved immune function, and reduced stress. Seek guidance from your physician and exercise physiologist before implementing an exercise routine and learn your capabilities and proper form for all exercise.  Medical   Aging: Enhancing Your Quality of Life  Clinical staff conducted group or individual video education with verbal and written material and guidebook.  Patient learns key strategies and recommendations to stay in good physical health and enhance quality of life, such as prevention  strategies, having an advocate, securing a Health Care Proxy and Power of Attorney, and keeping a list of medications and system for tracking them. It also discusses how to avoid risk for bone loss.  Biology of Weight Control  Clinical staff conducted group or individual video education with verbal and written material and guidebook.  Patient learns that weight gain occurs because we consume more calories than we burn (eating more, moving less). Even if your body weight is normal, you may have higher ratios of fat compared to muscle mass. Too much body fat puts you at increased risk for cardiovascular disease, heart attack, stroke, type 2 diabetes, and obesity-related cancers. In addition to exercise, following the Pritikin Eating Plan can help reduce your risk.  Decoding Lab Results  Clinical staff conducted group or individual video education with verbal and written material and guidebook.  Patient learns that lab test reflects one measurement whose values change over time and are influenced by many factors, including medication, stress, sleep, exercise, food, hydration, pre-existing medical conditions, and more. It is recommended to use the knowledge from this video to become more involved with your lab results and evaluate your numbers to speak with your doctor.   Diseases of Our Time - Overview  Clinical staff conducted group or individual video education with verbal and written material and guidebook.  Patient learns that according to the CDC, 50% to 70% of chronic diseases (such as obesity, type 2 diabetes, elevated lipids, hypertension, and heart disease) are avoidable through lifestyle improvements including healthier food choices, listening to satiety cues, and increased physical activity.  Sleep Disorders Clinical staff conducted group or individual video education with verbal and written material and guidebook.  Patient learns how good quality and duration of sleep are important to  overall health and well-being. Patient also learns about sleep disorders and how they impact health along with  recommendations to address them, including discussing with a physician.  Nutrition  Dining Out - Part 2 Clinical staff conducted group or individual video education with verbal and written material and guidebook.  Patient learns how to plan ahead and communicate in order to maximize their dining experience in a healthy and nutritious manner. Included are recommended food choices based on the type of restaurant the patient is visiting.   Fueling a Banker conducted group or individual video education with verbal and written material and guidebook.  There is a strong connection between our food choices and our health. Diseases like obesity and type 2 diabetes are very prevalent and are in large-part due to lifestyle choices. The Pritikin Eating Plan provides plenty of food and hunger-curbing satisfaction. It is easy to follow, affordable, and helps reduce health risks.  Menu Workshop  Clinical staff conducted group or individual video education with verbal and written material and guidebook.  Patient learns that restaurant meals can sabotage health goals because they are often packed with calories, fat, sodium, and sugar. Recommendations include strategies to plan ahead and to communicate with the manager, chef, or server to help order a healthier meal.  Planning Your Eating Strategy  Clinical staff conducted group or individual video education with verbal and written material and guidebook.  Patient learns about the Pritikin Eating Plan and its benefit of reducing the risk of disease. The Pritikin Eating Plan does not focus on calories. Instead, it emphasizes high-quality, nutrient-rich foods. By knowing the characteristics of the foods, we choose, we can determine their calorie density and make informed decisions.  Targeting Your Nutrition Priorities  Clinical staff  conducted group or individual video education with verbal and written material and guidebook.  Patient learns that lifestyle habits have a tremendous impact on disease risk and progression. This video provides eating and physical activity recommendations based on your personal health goals, such as reducing LDL cholesterol, losing weight, preventing or controlling type 2 diabetes, and reducing high blood pressure.  Vitamins and Minerals  Clinical staff conducted group or individual video education with verbal and written material and guidebook.  Patient learns different ways to obtain key vitamins and minerals, including through a recommended healthy diet. It is important to discuss all supplements you take with your doctor.   Healthy Mind-Set    Smoking Cessation  Clinical staff conducted group or individual video education with verbal and written material and guidebook.  Patient learns that cigarette smoking and tobacco addiction pose a serious health risk which affects millions of people. Stopping smoking will significantly reduce the risk of heart disease, lung disease, and many forms of cancer. Recommended strategies for quitting are covered, including working with your doctor to develop a successful plan.  Culinary   Becoming a Set designer conducted group or individual video education with verbal and written material and guidebook.  Patient learns that cooking at home can be healthy, cost-effective, quick, and puts them in control. Keys to cooking healthy recipes will include looking at your recipe, assessing your equipment needs, planning ahead, making it simple, choosing cost-effective seasonal ingredients, and limiting the use of added fats, salts, and sugars.  Cooking - Breakfast and Snacks  Clinical staff conducted group or individual video education with verbal and written material and guidebook.  Patient learns how important breakfast is to satiety and nutrition  through the entire day. Recommendations include key foods to eat during breakfast to help stabilize blood sugar levels and  to prevent overeating at meals later in the day. Planning ahead is also a key component.  Cooking - Educational psychologist conducted group or individual video education with verbal and written material and guidebook.  Patient learns eating strategies to improve overall health, including an approach to cook more at home. Recommendations include thinking of animal protein as a side on your plate rather than center stage and focusing instead on lower calorie dense options like vegetables, fruits, whole grains, and plant-based proteins, such as beans. Making sauces in large quantities to freeze for later and leaving the skin on your vegetables are also recommended to maximize your experience.  Cooking - Healthy Salads and Dressing Clinical staff conducted group or individual video education with verbal and written material and guidebook.  Patient learns that vegetables, fruits, whole grains, and legumes are the foundations of the Pritikin Eating Plan. Recommendations include how to incorporate each of these in flavorful and healthy salads, and how to create homemade salad dressings. Proper handling of ingredients is also covered. Cooking - Soups and [employer] - Soups and Desserts Clinical staff conducted group or individual video education with verbal and written material and guidebook.  Patient learns that Pritikin soups and desserts make for easy, nutritious, and delicious snacks and meal components that are low in sodium, fat, sugar, and calorie density, while high in vitamins, minerals, and filling fiber. Recommendations include simple and healthy ideas for soups and desserts.   Overview     The Pritikin Solution Program Overview Clinical staff conducted group or individual video education with verbal and written material and guidebook.  Patient learns that the  results of the Pritikin Program have been documented in more than 100 articles published in peer-reviewed journals, and the benefits include reducing risk factors for (and, in some cases, even reversing) high cholesterol, high blood pressure, type 2 diabetes, obesity, and more! An overview of the three key pillars of the Pritikin Program will be covered: eating well, doing regular exercise, and having a healthy mind-set.  WORKSHOPS  Exercise: Exercise Basics: Building Your Action Plan Clinical staff led group instruction and group discussion with PowerPoint presentation and patient guidebook. To enhance the learning environment the use of posters, models and videos may be added. At the conclusion of this workshop, patients will comprehend the difference between physical activity and exercise, as well as the benefits of incorporating both, into their routine. Patients will understand the FITT (Frequency, Intensity, Time, and Type) principle and how to use it to build an exercise action plan. In addition, safety concerns and other considerations for exercise and cardiac rehab will be addressed by the presenter. The purpose of this lesson is to promote a comprehensive and effective weekly exercise routine in order to improve patients' overall level of fitness.   Managing Heart Disease: Your Path to a Healthier Heart Clinical staff led group instruction and group discussion with PowerPoint presentation and patient guidebook. To enhance the learning environment the use of posters, models and videos may be added.At the conclusion of this workshop, patients will understand the anatomy and physiology of the heart. Additionally, they will understand how Pritikin's three pillars impact the risk factors, the progression, and the management of heart disease.  The purpose of this lesson is to provide a high-level overview of the heart, heart disease, and how the Pritikin lifestyle positively impacts risk  factors.  Exercise Biomechanics Clinical staff led group instruction and group discussion with PowerPoint presentation and patient guidebook.  To enhance the learning environment the use of posters, models and videos may be added. Patients will learn how the structural parts of their bodies function and how these functions impact their daily activities, movement, and exercise. Patients will learn how to promote a neutral spine, learn how to manage pain, and identify ways to improve their physical movement in order to promote healthy living. The purpose of this lesson is to expose patients to common physical limitations that impact physical activity. Participants will learn practical ways to adapt and manage aches and pains, and to minimize their effect on regular exercise. Patients will learn how to maintain good posture while sitting, walking, and lifting.  Balance Training and Fall Prevention  Clinical staff led group instruction and group discussion with PowerPoint presentation and patient guidebook. To enhance the learning environment the use of posters, models and videos may be added. At the conclusion of this workshop, patients will understand the importance of their sensorimotor skills (vision, proprioception, and the vestibular system) in maintaining their ability to balance as they age. Patients will apply a variety of balancing exercises that are appropriate for their current level of function. Patients will understand the common causes for poor balance, possible solutions to these problems, and ways to modify their physical environment in order to minimize their fall risk. The purpose of this lesson is to teach patients about the importance of maintaining balance as they age and ways to minimize their risk of falling.  WORKSHOPS   Nutrition:  Fueling a Ship broker led group instruction and group discussion with PowerPoint presentation and patient guidebook. To enhance  the learning environment the use of posters, models and videos may be added. Patients will review the foundational principles of the Pritikin Eating Plan and understand what constitutes a serving size in each of the food groups. Patients will also learn Pritikin-friendly foods that are better choices when away from home and review make-ahead meal and snack options. Calorie density will be reviewed and applied to three nutrition priorities: weight maintenance, weight loss, and weight gain. The purpose of this lesson is to reinforce (in a group setting) the key concepts around what patients are recommended to eat and how to apply these guidelines when away from home by planning and selecting Pritikin-friendly options. Patients will understand how calorie density may be adjusted for different weight management goals.  Mindful Eating  Clinical staff led group instruction and group discussion with PowerPoint presentation and patient guidebook. To enhance the learning environment the use of posters, models and videos may be added. Patients will briefly review the concepts of the Pritikin Eating Plan and the importance of low-calorie dense foods. The concept of mindful eating will be introduced as well as the importance of paying attention to internal hunger signals. Triggers for non-hunger eating and techniques for dealing with triggers will be explored. The purpose of this lesson is to provide patients with the opportunity to review the basic principles of the Pritikin Eating Plan, discuss the value of eating mindfully and how to measure internal cues of hunger and fullness using the Hunger Scale. Patients will also discuss reasons for non-hunger eating and learn strategies to use for controlling emotional eating.  Targeting Your Nutrition Priorities Clinical staff led group instruction and group discussion with PowerPoint presentation and patient guidebook. To enhance the learning environment the use of posters,  models and videos may be added. Patients will learn how to determine their genetic susceptibility to disease by reviewing their family  history. Patients will gain insight into the importance of diet as part of an overall healthy lifestyle in mitigating the impact of genetics and other environmental insults. The purpose of this lesson is to provide patients with the opportunity to assess their personal nutrition priorities by looking at their family history, their own health history and current risk factors. Patients will also be able to discuss ways of prioritizing and modifying the Pritikin Eating Plan for their highest risk areas  Menu  Clinical staff led group instruction and group discussion with PowerPoint presentation and patient guidebook. To enhance the learning environment the use of posters, models and videos may be added. Using menus brought in from [employer], or printed from [employer], patients will apply the Pritikin dining out guidelines that were presented in the [employer] video. Patients will also be able to practice these guidelines in a variety of provided scenarios. The purpose of this lesson is to provide patients with the opportunity to practice hands-on learning of the Pritikin Dining Out guidelines with actual menus and practice scenarios.  Label Reading Clinical staff led group instruction and group discussion with PowerPoint presentation and patient guidebook. To enhance the learning environment the use of posters, models and videos may be added. Patients will review and discuss the Pritikin label reading guidelines presented in Pritikin's Label Reading Educational series video. Using fool labels brought in from local grocery stores and markets, patients will apply the label reading guidelines and determine if the packaged food meet the Pritikin guidelines. The purpose of this lesson is to provide patients with the opportunity to review, discuss, and  practice hands-on learning of the Pritikin Label Reading guidelines with actual packaged food labels. Cooking School  Pritikin's [employer] are designed to teach patients ways to prepare quick, simple, and affordable recipes at home. The importance of nutrition's role in chronic disease risk reduction is reflected in its emphasis in the overall Pritikin program. By learning how to prepare essential core Pritikin Eating Plan recipes, patients will increase control over what they eat; be able to customize the flavor of foods without the use of added salt, sugar, or fat; and improve the quality of the food they consume. By learning a set of core recipes which are easily assembled, quickly prepared, and affordable, patients are more likely to prepare more healthy foods at home. These workshops focus on convenient breakfasts, simple entres, side dishes, and desserts which can be prepared with minimal effort and are consistent with nutrition recommendations for cardiovascular risk reduction. Cooking [employer] are taught by a Armed forces logistics/support/administrative officer (RD) who has been trained by the [employer]. The chef or RD has a clear understanding of the importance of minimizing - if not completely eliminating - added fat, sugar, and sodium in recipes. Throughout the series of Cooking School Workshop sessions, patients will learn about healthy ingredients and efficient methods of cooking to build confidence in their capability to prepare    Cooking School weekly topics:  Adding Flavor- Sodium-Free  Fast and Healthy Breakfasts  Powerhouse Plant-Based Proteins  Satisfying Salads and Dressings  Simple Sides and Sauces  International Cuisine-Spotlight on the [employer] Zones  Delicious Desserts  Savory Soups  [employer] - Meals in a Astronomer Appetizers and Snacks  Comforting Weekend Breakfasts  One-Pot Wonders   Fast Evening Meals  Landscape architect Your  Pritikin Plate  WORKSHOPS   Healthy Mindset (Psychosocial):  Focused Goals, Sustainable Changes  Clinical staff led group instruction and group discussion with PowerPoint presentation and patient guidebook. To enhance the learning environment the use of posters, models and videos may be added. Patients will be able to apply effective goal setting strategies to establish at least one personal goal, and then take consistent, meaningful action toward that goal. They will learn to identify common barriers to achieving personal goals and develop strategies to overcome them. Patients will also gain an understanding of how our mind-set can impact our ability to achieve goals and the importance of cultivating a positive and growth-oriented mind-set. The purpose of this lesson is to provide patients with a deeper understanding of how to set and achieve personal goals, as well as the tools and strategies needed to overcome common obstacles which may arise along the way.  From Head to Heart: The Power of a Healthy Outlook  Clinical staff led group instruction and group discussion with PowerPoint presentation and patient guidebook. To enhance the learning environment the use of posters, models and videos may be added. Patients will be able to recognize and describe the impact of emotions and mood on physical health. They will discover the importance of self-care and explore self-care practices which may work for them. Patients will also learn how to utilize the 4 C's to cultivate a healthier outlook and better manage stress and challenges. The purpose of this lesson is to demonstrate to patients how a healthy outlook is an essential part of maintaining good health, especially as they continue their cardiac rehab journey.  Healthy Sleep for a Healthy Heart Clinical staff led group instruction and group discussion with PowerPoint presentation and patient guidebook. To enhance the learning environment the use of  posters, models and videos may be added. At the conclusion of this workshop, patients will be able to demonstrate knowledge of the importance of sleep to overall health, well-being, and quality of life. They will understand the symptoms of, and treatments for, common sleep disorders. Patients will also be able to identify daytime and nighttime behaviors which impact sleep, and they will be able to apply these tools to help manage sleep-related challenges. The purpose of this lesson is to provide patients with a general overview of sleep and outline the importance of quality sleep. Patients will learn about a few of the most common sleep disorders. Patients will also be introduced to the concept of "sleep hygiene," and discover ways to self-manage certain sleeping problems through simple daily behavior changes. Finally, the workshop will motivate patients by clarifying the links between quality sleep and their goals of heart-healthy living.   Recognizing and Reducing Stress Clinical staff led group instruction and group discussion with PowerPoint presentation and patient guidebook. To enhance the learning environment the use of posters, models and videos may be added. At the conclusion of this workshop, patients will be able to understand the types of stress reactions, differentiate between acute and chronic stress, and recognize the impact that chronic stress has on their health. They will also be able to apply different coping mechanisms, such as reframing negative self-talk. Patients will have the opportunity to practice a variety of stress management techniques, such as deep abdominal breathing, progressive muscle relaxation, and/or guided imagery.  The purpose of this lesson is to educate patients on the role of stress in their lives and to provide healthy techniques for coping with it.  Learning Barriers/Preferences:  Learning Barriers/Preferences - 01/10/23 1503       Learning Barriers/Preferences    Learning Barriers  None    Learning Preferences Group Instruction;Individual Instruction;Skilled Demonstration             Education Topics:  Knowledge Questionnaire Score:  Knowledge Questionnaire Score - 01/10/23 1504       Knowledge Questionnaire Score   Pre Score 19/24             Core Components/Risk Factors/Patient Goals at Admission:  Personal Goals and Risk Factors at Admission - 01/10/23 1504       Core Components/Risk Factors/Patient Goals on Admission    Weight Management Yes;Obesity;Weight Loss    Intervention Weight Management: Develop a combined nutrition and exercise program designed to reach desired caloric intake, while maintaining appropriate intake of nutrient and fiber, sodium and fats, and appropriate energy expenditure required for the weight goal.;Weight Management: Provide education and appropriate resources to help participant work on and attain dietary goals.;Weight Management/Obesity: Establish reasonable short term and long term weight goals.;Obesity: Provide education and appropriate resources to help participant work on and attain dietary goals.    Admit Weight 212 lb 11.9 oz (96.5 kg)    Expected Outcomes Short Term: Continue to assess and modify interventions until short term weight is achieved;Long Term: Adherence to nutrition and physical activity/exercise program aimed toward attainment of established weight goal;Weight Loss: Understanding of general recommendations for a balanced deficit meal plan, which promotes 1-2 lb weight loss per week and includes a negative energy balance of [phone] kcal/d;Understanding recommendations for meals to include 15-35% energy as protein, 25-35% energy from fat, 35-60% energy from carbohydrates, less than 200mg  of dietary cholesterol, 20-35 gm of total fiber daily;Understanding of distribution of calorie intake throughout the day with the consumption of 4-5 meals/snacks    Tobacco Cessation Yes    Number of packs  per day 1/2 PPD   Referral faxed to [facility] Quitline. Patient says she is ready to quit and has cut back   Intervention Assist the participant in steps to quit. Provide individualized education and counseling about committing to Tobacco Cessation, relapse prevention, and pharmacological support that can be provided by physician.;Education officer, environmental, assist with locating and accessing local/national Quit Smoking programs, and support quit date choice.    Expected Outcomes Short Term: Will demonstrate readiness to quit, by selecting a quit date.;Short Term: Will quit all tobacco product use, adhering to prevention of relapse plan.;Long Term: Complete abstinence from all tobacco products for at least 12 months from quit date.    Diabetes Yes    Intervention Provide education about signs/symptoms and action to take for hypo/hyperglycemia.;Provide education about proper nutrition, including hydration, and aerobic/resistive exercise prescription along with prescribed medications to achieve blood glucose in normal ranges: Fasting glucose 65-99 mg/dL    Expected Outcomes Short Term: Participant verbalizes understanding of the signs/symptoms and immediate care of hyper/hypoglycemia, proper foot care and importance of medication, aerobic/resistive exercise and nutrition plan for blood glucose control.;Long Term: Attainment of HbA1C < 7%.    Hypertension Yes    Intervention Monitor prescription use compliance.;Provide education on lifestyle modifcations including regular physical activity/exercise, weight management, moderate sodium restriction and increased consumption of fresh fruit, vegetables, and low fat dairy, alcohol moderation, and smoking cessation.    Expected Outcomes Short Term: Continued assessment and intervention until BP is < 140/38mm HG in hypertensive participants. < 130/32mm HG in hypertensive participants with diabetes, heart failure or chronic kidney disease.;Long Term: Maintenance of blood  pressure at goal levels.    Lipids Yes    Intervention Provide education and support  for participant on nutrition & aerobic/resistive exercise along with prescribed medications to achieve LDL 70mg , HDL >40mg .    Expected Outcomes Short Term: Participant states understanding of desired cholesterol values and is compliant with medications prescribed. Participant is following exercise prescription and nutrition guidelines.;Long Term: Cholesterol controlled with medications as prescribed, with individualized exercise RX and with personalized nutrition plan. Value goals: LDL < 70mg , HDL > 40 mg.    Stress Yes    Intervention Offer individual and/or small group education and counseling on adjustment to heart disease, stress management and health-related lifestyle change. Teach and support self-help strategies.;Refer participants experiencing significant psychosocial distress to appropriate mental health specialists for further evaluation and treatment. When possible, include family members and significant others in education/counseling sessions.    Expected Outcomes Short Term: Participant demonstrates changes in health-related behavior, relaxation and other stress management skills, ability to obtain effective social support, and compliance with psychotropic medications if prescribed.;Long Term: Emotional wellbeing is indicated by absence of clinically significant psychosocial distress or social isolation.    Personal Goal Other Yes    Personal Goal Short term:modification with soda drinking Long term: stamina wt loss (60 lbs)    Intervention Will continue to monitor pt and progress workloads as tolerated without sign or symptom    Expected Outcomes Pt will achieve her goals             Core Components/Risk Factors/Patient Goals Review:    Core Components/Risk Factors/Patient Goals at Discharge (Final Review):    ITP Comments:  ITP Comments     Row Name 01/10/23 1323 01/14/23 1706          ITP Comments Armanda Magic, MD:  Medical Director.  Introduction to the [facility] / Intensive Cardiac Rehab.  Initial orientation packet reviewed with the patient. 30 Day ITP Review. Rickey did not start exercise at cardiac rehab on 01/14/23 due to hypoglycemia. Plans to return to exercise on Wednesday.               Comments: See ITP comments.Thayer Headings RN BSN

## 2023-01-16 ENCOUNTER — Encounter (HOSPITAL_COMMUNITY): Payer: BC Managed Care – PPO

## 2023-01-16 ENCOUNTER — Encounter (HOSPITAL_COMMUNITY)
Admission: RE | Admit: 2023-01-16 | Discharge: 2023-01-16 | Disposition: A | Discharge status: Home / Self Care | Payer: BC Managed Care – PPO | Source: Ambulatory Visit | Attending: Cardiovascular Disease | Admitting: Cardiovascular Disease

## 2023-01-16 DIAGNOSIS — Z955 Presence of coronary angioplasty implant and graft: Secondary | ICD-10-CM | POA: Diagnosis not present

## 2023-01-16 DIAGNOSIS — Z48812 Encounter for surgical aftercare following surgery on the circulatory system: Secondary | ICD-10-CM | POA: Diagnosis not present

## 2023-01-16 DIAGNOSIS — I2111 ST elevation (STEMI) myocardial infarction involving right coronary artery: Secondary | ICD-10-CM

## 2023-01-16 DIAGNOSIS — E162 Hypoglycemia, unspecified: Secondary | ICD-10-CM | POA: Diagnosis not present

## 2023-01-16 LAB — GLUCOSE, CAPILLARY: Glucose-Capillary: 137 mg/dL — ABNORMAL HIGH (ref 70–99)

## 2023-01-16 NOTE — Progress Notes (Signed)
Daily Session Note  Patient Details  Name: Teresa Parks MRN: [id number] Date of Birth: 1970/07/22 Referring Provider:   Flowsheet Row INTENSIVE CARDIAC REHAB ORIENT from 01/10/2023 in [facility]  Referring Provider Dr. Tonny Bollman MD       Encounter Date: 01/16/2023  Check In:  Session Check In - 01/16/23 1520       Check-In   Supervising physician immediately available to respond to emergencies [facility] MD immediately available    Physician(s) Carlos Levering, NP    Location [facility]    Staff Present Hughie Closs BS, ACSM-CEP, Exercise Physiologist;Shacoya Burkhammer, RN, Marton Redwood, MS, ACSM-CEP, CCRP, Exercise Physiologist;Olinty Peggye Pitt, MS, ACSM-CEP, Exercise Physiologist;Randi Idelle Crouch BS, ACSM-CEP, Exercise Physiologist;Mary Gerre Scull, RN, BSN    Virtual Visit No    Medication changes reported     Yes    Comments Sliding scale Novolog decreased by 5 units, and Tresiba decreased from 52  to 25 units on exercise days.    Fall or balance concerns reported    No    Tobacco Cessation No Change    Warm-up and Cool-down Performed as group-led instruction    Resistance Training Performed No    VAD Patient? No    PAD/SET Patient? No      Pain Assessment   Currently in Pain? No/denies    Pain Score 0-No pain    Multiple Pain Sites No             Capillary Blood Glucose: Results for orders placed or performed during the hospital encounter of 01/16/23 (from the past 24 hour(s))  Glucose, capillary     Status: Abnormal   Collection Time: 01/16/23  3:10 PM  Result Value Ref Range   Glucose-Capillary 137 (H) 70 - 99 mg/dL     Exercise Prescription Changes - 01/16/23 1628       Response to Exercise   Blood Pressure (Admit) 138/78    Blood Pressure (Exercise) 220/90    Blood Pressure (Exit) 124/68    Heart Rate (Admit) 94 bpm    Heart Rate (Exercise) 127 bpm    Heart Rate (Exit) 103 bpm     Rating of Perceived Exertion (Exercise) 7.5    Perceived Dyspnea (Exercise) 0    Symptoms High BP with elliptical, d/c and resolved with rest. Recheck 128/78    Comments Pt first day on exercise in teh CRP2 program    Duration Progress to 30 minutes of  aerobic without signs/symptoms of physical distress    Intensity THRR unchanged      Progression   Progression Continue to progress workloads to maintain intensity without signs/symptoms of physical distress.    Average METs 2.91      Resistance Training   Training Prescription No      Treadmill   MPH 2.5    Grade 0    Minutes 15    METs 2.91      Elliptical   Level 1    Speed 1    Minutes 7   D/C after 7 mins due to high BP 220/90, resolved with rest. Recheck 128/78            Social History   Tobacco Use  Smoking Status Every Day   Packs/day: 0.50   Years: 35.00   Additional pack years: 0.00   Total pack years: 17.50   Types: Cigarettes   Start date: 58  Smokeless Tobacco Never  Tobacco Comments   01/10/23 Referral faxed to [facility] QUITLINE    Goals Met:  Exercise tolerated well despite elevated BP on elliptical  Goals Unmet:  BP  Comments: Pt started cardiac rehab today.  Pt tolerated light exercise without difficulty. VSS, telemetry-Sinus Rhythm, asymptomatic.  Medication list reconciled. Pt denies barriers to medicaiton compliance.  PSYCHOSOCIAL ASSESSMENT:  PHQ-3. Pt exhibits positive coping skills, hopeful outlook with supportive family. No psychosocial needs identified at this time, no psychosocial interventions necessary.    Pt enjoys reading crocheting, knitting, family and going to the beach.   Pt oriented to exercise equipment and routine.    Understanding verbalized.Thayer Headings RN BSN    Dr. Armanda Magic is Medical Director for Cardiac Rehab at [facility].

## 2023-01-16 NOTE — Progress Notes (Signed)
Entry blood pressure noted at 138/78 pre exercise. Heart rate 94. Blood pressure noted at 220/90 on the elliptical. Exercise stopped. Recheck resting blood pressure 128/78. Asymptomatic. Proceeded to exercise on the treadmill blood pressure 162/80. Then 124/70. Johnny says she is taking her medications as prescribed. Carlos Levering NP, onsite provider notified about today's blood pressures.  Per Carlos Levering NP. I would have her take BP at home twice a day with the first reading 2 hours after medication and the second one in the evening some time. She can call the office if her BP is consistently >130/80 which I do not think will be the case.  Patient given instructions on checking her blood pressures at home. Bunice plans to return to exercise on Friday.Thayer Headings RN BSN

## 2023-01-18 ENCOUNTER — Encounter (HOSPITAL_COMMUNITY): Payer: BC Managed Care – PPO

## 2023-01-18 ENCOUNTER — Encounter: Payer: Self-pay | Admitting: Cardiovascular Disease

## 2023-01-21 ENCOUNTER — Encounter (HOSPITAL_COMMUNITY): Payer: BC Managed Care – PPO

## 2023-01-22 ENCOUNTER — Ambulatory Visit (INDEPENDENT_AMBULATORY_CARE_PROVIDER_SITE_OTHER): Payer: BC Managed Care – PPO | Admitting: Family Medicine

## 2023-01-22 ENCOUNTER — Encounter: Payer: Self-pay | Admitting: Family Medicine

## 2023-01-22 VITALS — BP 133/73 | HR 84 | Temp 98.0°F | Resp 18 | Ht 65.5 in | Wt 211.8 lb

## 2023-01-22 DIAGNOSIS — M47816 Spondylosis without myelopathy or radiculopathy, lumbar region: Secondary | ICD-10-CM | POA: Diagnosis not present

## 2023-01-22 DIAGNOSIS — I2111 ST elevation (STEMI) myocardial infarction involving right coronary artery: Secondary | ICD-10-CM

## 2023-01-22 DIAGNOSIS — E1165 Type 2 diabetes mellitus with hyperglycemia: Secondary | ICD-10-CM

## 2023-01-22 DIAGNOSIS — E781 Pure hyperglyceridemia: Secondary | ICD-10-CM | POA: Diagnosis not present

## 2023-01-22 DIAGNOSIS — Z7689 Persons encountering health services in other specified circumstances: Secondary | ICD-10-CM

## 2023-01-22 DIAGNOSIS — Z794 Long term (current) use of insulin: Secondary | ICD-10-CM

## 2023-01-22 MED ORDER — TRESIBA FLEXTOUCH 200 UNIT/ML ~~LOC~~ SOPN: PEN_INJECTOR | SUBCUTANEOUS | 3 refills | Status: DC

## 2023-01-22 MED ORDER — ICOSAPENT ETHYL 0.5 G PO CAPS: 3.0000 | ORAL_CAPSULE | Freq: Every day | ORAL | Status: DC

## 2023-01-22 NOTE — Progress Notes (Signed)
New Patient Office Visit  Subjective    Patient ID: Teresa Parks, female    DOB: Sep 07, 1970  Age: 52 y.o. MRN: [id number]  CC:  Chief Complaint  Patient presents with   Establish Care    Patient Is here to establish care with new pcp    HPI [facility] presents to establish care. Pt is new to me. She hasn't seen a PCP in over a year.   Pt has allergies and taking Zyrtec 10mg  daily prn along with flonase prn for congestion.   Diabetes She is diabetic and using Novolog sliding scale 15-35 units TID per sliding scale. She has a chart that she follows and is also taking [place] 25 units in the morning and 52 units at night. She is also using Amaryl 2mg  daily. She is on Mounjaro 2.5 mg weekly and just started this 3 weeks ago.  She is on crestor 10mg  daily and benicar 40 mg daily since her MI on June 7th. She is established with [facility] for her diabetes.   CAD/MI She has hx of MI on June 7th with 1 stent. She is taking Brilinta 90 mg BID, NTG as needed for chest pain that she hasn't had to use. She has Metoprolol 25mg  daily. She was on Vascepa 0.5 g 3 capsules a day along with Fenofibrate 160mg  daily.   She has appt with OB in a few weeks who does her mammogram and pap smears. She reports she had first mammogram at the age of 52 y.o and had abnormal finding of her right breast. She has a marker placed and she gets these once a year.  She has had colonoscopy at [facility]. She has sent this to me today via mychart.  Pt reports she has hx of DDD of lumbar spine. She was taking Flexeril 10 mg daily prn.    Outpatient Encounter Medications as of 01/22/2023  Medication Sig   acetaminophen (TYLENOL) 325 MG tablet Take 325-650 mg by mouth as needed for headache.   aspirin 81 MG chewable tablet Chew 1 tablet (81 mg total) by mouth daily.   cetirizine (ZYRTEC ALLERGY) 10 MG tablet Take 1 tablet (10 mg total) by mouth daily.    Continuous Glucose Sensor (FREESTYLE LIBRE 3 SENSOR) MISC USE AS DIRECTED SUBCUTANEOUS EVERY 15 DAYS for 30 days   fenofibrate 160 MG tablet Take 1 tablet (160 mg total) by mouth daily. (Patient taking differently: Take 160 mg by mouth daily in the afternoon.)   fluticasone (FLONASE) 50 MCG/ACT nasal spray Place 2 sprays into both nostrils daily.   glimepiride (AMARYL) 2 MG tablet Take 2 mg by mouth every morning.   Glucagon (BAQSIMI ONE PACK) 3 MG/DOSE POWD Place 1 Dose into the nose as needed (low blood sugar).   glucose blood test strip Use as instructed   Icosapent Ethyl (VASCEPA) 0.5 g CAPS Take 4 capsules by mouth daily. (Patient taking differently: Take 3 capsules by mouth daily.)   insulin aspart (NOVOLOG) 100 UNIT/ML injection Inject 15-35 Units into the skin 3 (three) times daily before meals.   insulin degludec (TRESIBA FLEXTOUCH) 200 UNIT/ML FlexTouch Pen Inject 56 Units into the skin daily. And pen needles 1/day (Patient taking differently: Inject 52 Units into the skin 2 (two) times daily.)   Lancets (FREESTYLE) lancets Use as instructed   metoprolol succinate (TOPROL-XL) 25 MG 24 hr tablet Take 1 tablet (25 mg total) by mouth daily.   MOUNJARO  2.5 MG/0.5ML Pen Inject 2.5 mg into the skin once a week.   nitroGLYCERIN (NITROSTAT) 0.4 MG SL tablet Place 1 tablet (0.4 mg total) under the tongue every 5 (five) minutes as needed for chest pain.   olmesartan (BENICAR) 40 MG tablet Take 1 tablet (40 mg total) by mouth daily.   rosuvastatin (CRESTOR) 10 MG tablet Take 1 tablet (10 mg total) by mouth daily.   ticagrelor (BRILINTA) 90 MG TABS tablet Take 1 tablet (90 mg total) by mouth 2 (two) times daily.   No facility-administered encounter medications on file as of 01/22/2023.    Past Medical History:  Diagnosis Date   Acute ST elevation myocardial infarction (STEMI) of inferior wall (HCC) 12/21/2022   Arthritis    Bell's palsy    hx of 15 years ago approx   CAD in native artery with  cardiac cath and DES to [facility] 12/21/22 12/22/2022   Carpal tunnel syndrome    Chronic ear infection    Chronic pancreatitis (HCC)    Diabetes (HCC)    Diabetes mellitus type II, controlled (HCC)    Hemorrhoids    HTN (hypertension)    pt reports b/p runs high when goes to MD office   Hyperlipidemia    Obesity    Pancreatitis, acute     Past Surgical History:  Procedure Laterality Date   CARDIAC CATHETERIZATION     CORONARY/GRAFT ACUTE MI REVASCULARIZATION N/A 12/21/2022   Procedure: Coronary/Graft Acute MI Revascularization;  Surgeon: Teresa Bollman, MD;  Location: [facility];  Service: Cardiovascular;  Laterality: N/A;   DILATATION & CURETTAGE/HYSTEROSCOPY WITH MYOSURE N/A 04/18/2022   Procedure: DILATATION & CURETTAGE/HYSTEROSCOPY WITH MYOSURE;  Surgeon: Teresa Fila, MD;  Location: [facility];  Service: Gynecology;  Laterality: N/A;   KNEE ARTHROSCOPY Right    LEFT HEART CATH AND CORONARY ANGIOGRAPHY N/A 12/21/2022   Procedure: LEFT HEART CATH AND CORONARY ANGIOGRAPHY;  Surgeon: Teresa Bollman, MD;  Location: [facility];  Service: Cardiovascular;  Laterality: N/A;   UMBILICAL HERNIA REPAIR     WISDOM TOOTH EXTRACTION     XI ROBOTIC ASSISTED OOPHORECTOMY N/A 04/18/2022   Procedure: XI ROBOTIC ASSISTED BILATERAL  SALPINGECTOMY LEFT OOPHORECTOMY;  Surgeon: Teresa Fila, MD;  Location: [facility];  Service: Gynecology;  Laterality: N/A;    Family History  Problem Relation Age of Onset   Diabetes Mother    Hypertension Mother        Living   Heart attack Father 65       Deceased   Diabetes Father    Healthy Brother    Diabetes Paternal Uncle    Alzheimer's disease Maternal Grandfather    Healthy Daughter    Stomach cancer Neg Hx    Colon cancer Neg Hx    Esophageal cancer Neg Hx    Breast cancer Neg Hx    Ovarian cancer Neg Hx    Endometrial cancer Neg Hx    Pancreatic cancer Neg Hx    Prostate cancer Neg Hx     Social History   Socioeconomic  History   Marital status: Married    Spouse name: Not on file   Number of children: 1   Years of education: 16   Highest education level: Not on file  Occupational History   Occupation: Radio broadcast assistant   Occupation: works remotely as an Airline pilot  Tobacco Use   Smoking status: Every Day    Packs/day: 0.50    Years: 35.00  Additional pack years: 0.00    Total pack years: 17.50    Types: Cigarettes    Start date: 1988   Smokeless tobacco: Never   Tobacco comments:    01/10/23 Referral faxed to [facility] QUITLINE  Vaping Use   Vaping Use: Never used  Substance and Sexual Activity   Alcohol use: No    Alcohol/week: 0.0 standard drinks of alcohol   Drug use: No   Sexual activity: Yes    Birth control/protection: None  Other Topics Concern   Not on file  Social History Narrative   Not on file   Social Determinants of Health   Financial Resource Strain: Not on file  Food Insecurity: No Food Insecurity (12/21/2022)   Hunger Vital Sign    Worried About Running Out of Food in the Last Year: Never true    Ran Out of Food in the Last Year: Never true  Transportation Needs: No Transportation Needs (12/21/2022)   PRAPARE - Administrator, Civil Service (Medical): No    Lack of Transportation (Non-Medical): No  Physical Activity: Not on file  Stress: Not on file  Social Connections: Not on file  Intimate Partner Violence: Not At Risk (12/21/2022)   Humiliation, Afraid, Rape, and Kick questionnaire    Fear of Current or Ex-Partner: No    Emotionally Abused: No    Physically Abused: No    Sexually Abused: No    Review of Systems  All other systems reviewed and are negative.      Objective    BP 133/73   Pulse 84   Temp 98 F (36.7 C) (Oral)   Resp 18   Ht 5' 5.5" (1.664 m)   Wt 211 lb 12.8 oz (96.1 kg)   SpO2 98%   BMI 34.71 kg/m   Physical Exam Vitals and nursing note reviewed.  Constitutional:      Appearance: Normal appearance. She is obese.   HENT:     Head: Normocephalic and atraumatic.     Right Ear: External ear normal.     Left Ear: External ear normal.  Cardiovascular:     Rate and Rhythm: Normal rate.  Pulmonary:     Effort: Pulmonary effort is normal.  Skin:    Capillary Refill: Capillary refill takes less than 2 seconds.  Neurological:     General: No focal deficit present.     Mental Status: She is alert and oriented to person, place, and time. Mental status is at baseline.  Psychiatric:        Mood and Affect: Mood normal.        Behavior: Behavior normal.        Thought Content: Thought content normal.        Judgment: Judgment normal.       Assessment & Plan:   Problem List Items Addressed This Visit   None Encounter to establish care with new doctor  Uncontrolled type 2 diabetes mellitus with hyperglycemia (HCC) -     Evaristo Bury FlexTouch; Take 25 units in the morning and 52 units at night  Dispense: 30 mL; Refill: 3 -     Icosapent Ethyl; Take 3 capsules (1.5 g total) by mouth daily.  Hypertriglyceridemia -     Icosapent Ethyl; Take 3 capsules (1.5 g total) by mouth daily.  Spondylosis of lumbar region without myelopathy or radiculopathy  STEMI involving right coronary artery (HCC)   To continue current regimen. See back in 6 months sooner prn. Continue follow  up with endocrinologist and cardiologist.   Total time spent with patient today 38 minutes. This includes reviewing records, evaluating the patient and coordinating care. Face-to-face time >50%.   No follow-ups on file.   Suzan Slick, MD

## 2023-01-23 ENCOUNTER — Encounter (HOSPITAL_COMMUNITY): Payer: BC Managed Care – PPO

## 2023-01-23 ENCOUNTER — Encounter (HOSPITAL_COMMUNITY)
Admission: RE | Admit: 2023-01-23 | Discharge: 2023-01-23 | Disposition: A | Discharge status: Home / Self Care | Payer: BC Managed Care – PPO | Source: Ambulatory Visit | Attending: Cardiovascular Disease | Admitting: Cardiovascular Disease

## 2023-01-23 DIAGNOSIS — Z48812 Encounter for surgical aftercare following surgery on the circulatory system: Secondary | ICD-10-CM | POA: Diagnosis not present

## 2023-01-23 DIAGNOSIS — I2111 ST elevation (STEMI) myocardial infarction involving right coronary artery: Secondary | ICD-10-CM | POA: Diagnosis not present

## 2023-01-23 DIAGNOSIS — Z955 Presence of coronary angioplasty implant and graft: Secondary | ICD-10-CM

## 2023-01-23 DIAGNOSIS — E162 Hypoglycemia, unspecified: Secondary | ICD-10-CM | POA: Diagnosis not present

## 2023-01-23 LAB — GLUCOSE, CAPILLARY: Glucose-Capillary: 114 mg/dL — ABNORMAL HIGH (ref 70–99)

## 2023-01-25 ENCOUNTER — Encounter (HOSPITAL_COMMUNITY): Payer: BC Managed Care – PPO

## 2023-01-25 ENCOUNTER — Encounter (HOSPITAL_COMMUNITY)
Admission: RE | Admit: 2023-01-25 | Discharge: 2023-01-25 | Disposition: A | Discharge status: Home / Self Care | Payer: BC Managed Care – PPO | Source: Ambulatory Visit | Attending: Cardiovascular Disease | Admitting: Cardiovascular Disease

## 2023-01-25 DIAGNOSIS — Z955 Presence of coronary angioplasty implant and graft: Secondary | ICD-10-CM

## 2023-01-25 DIAGNOSIS — I2111 ST elevation (STEMI) myocardial infarction involving right coronary artery: Secondary | ICD-10-CM

## 2023-01-25 DIAGNOSIS — E162 Hypoglycemia, unspecified: Secondary | ICD-10-CM | POA: Diagnosis not present

## 2023-01-25 DIAGNOSIS — Z48812 Encounter for surgical aftercare following surgery on the circulatory system: Secondary | ICD-10-CM | POA: Diagnosis not present

## 2023-01-28 ENCOUNTER — Encounter (HOSPITAL_COMMUNITY): Payer: BC Managed Care – PPO

## 2023-01-28 ENCOUNTER — Telehealth (HOSPITAL_COMMUNITY): Payer: Self-pay | Admitting: *Deleted

## 2023-01-28 NOTE — Telephone Encounter (Signed)
Left message.Ladrea had called earlier she did not attend exercise due to cold symptoms.Thayer Headings RN BSN

## 2023-01-28 NOTE — Telephone Encounter (Signed)
Patient left message on department voicemail. She's out today due to cold symptoms.

## 2023-01-29 ENCOUNTER — Telehealth (HOSPITAL_COMMUNITY): Payer: Self-pay | Admitting: *Deleted

## 2023-01-29 NOTE — Telephone Encounter (Signed)
Teresa Parks was out yesterday with cold symptoms. Teresa Parks is feeling better today and plans to return to exercise on Wednesday. Teresa Parks said her [facility] has been decreased to decreased to 20 units twice a day. Sliding scale has also been decreased. Medication list updated.Teresa Headings RN BSN

## 2023-01-30 ENCOUNTER — Encounter (HOSPITAL_COMMUNITY)
Admission: RE | Admit: 2023-01-30 | Discharge: 2023-01-30 | Disposition: A | Discharge status: Home / Self Care | Payer: BC Managed Care – PPO | Source: Ambulatory Visit | Attending: Cardiovascular Disease | Admitting: Cardiovascular Disease

## 2023-01-30 ENCOUNTER — Encounter (HOSPITAL_COMMUNITY): Payer: BC Managed Care – PPO

## 2023-01-30 DIAGNOSIS — I2111 ST elevation (STEMI) myocardial infarction involving right coronary artery: Secondary | ICD-10-CM

## 2023-01-30 DIAGNOSIS — Z955 Presence of coronary angioplasty implant and graft: Secondary | ICD-10-CM

## 2023-01-30 DIAGNOSIS — E162 Hypoglycemia, unspecified: Secondary | ICD-10-CM | POA: Diagnosis not present

## 2023-01-30 DIAGNOSIS — Z48812 Encounter for surgical aftercare following surgery on the circulatory system: Secondary | ICD-10-CM | POA: Diagnosis not present

## 2023-01-30 NOTE — Progress Notes (Signed)
CARDIAC REHAB PHASE 2  Reviewed home exercise with pt today. Pt is tolerating exercise well. Pt will continue to exercise on her own by playing games with her grandson and using her infinity hoop for 30-45 minutes per session 2 days a week in addition to the 3 days in CRP2. Advised pt on THRR, RPE scale, hydration and temperature/humidity precautions. Reinforced NTG use, S/S to stop exercise and when to call MD vs 911. Encouraged warm up cool down and stretches with exercise sessions. Pt verbalized understanding, all questions were answered and pt was given a copy to take home.    Harrie Jeans ACSM-CEP 01/30/2023 4:49 PM

## 2023-02-01 ENCOUNTER — Encounter (HOSPITAL_COMMUNITY): Payer: BC Managed Care – PPO

## 2023-02-01 ENCOUNTER — Telehealth (HOSPITAL_COMMUNITY): Payer: Self-pay | Admitting: *Deleted

## 2023-02-01 NOTE — Telephone Encounter (Signed)
Spoke with Clydie Braun. Today's classes have been cancelled due to [employer] issues. Gladstone Lighter, RN,BSN 02/01/2023 9:44 AM

## 2023-02-04 ENCOUNTER — Encounter (HOSPITAL_COMMUNITY): Payer: BC Managed Care – PPO

## 2023-02-04 DIAGNOSIS — C44722 Squamous cell carcinoma of skin of right lower limb, including hip: Secondary | ICD-10-CM | POA: Diagnosis not present

## 2023-02-06 ENCOUNTER — Encounter (HOSPITAL_COMMUNITY)
Admission: RE | Admit: 2023-02-06 | Discharge: 2023-02-06 | Disposition: A | Discharge status: Home / Self Care | Payer: BC Managed Care – PPO | Source: Ambulatory Visit | Attending: Cardiovascular Disease | Admitting: Cardiovascular Disease

## 2023-02-06 ENCOUNTER — Encounter (HOSPITAL_COMMUNITY): Payer: BC Managed Care – PPO

## 2023-02-06 DIAGNOSIS — E162 Hypoglycemia, unspecified: Secondary | ICD-10-CM | POA: Diagnosis not present

## 2023-02-06 DIAGNOSIS — Z955 Presence of coronary angioplasty implant and graft: Secondary | ICD-10-CM

## 2023-02-06 DIAGNOSIS — Z1231 Encounter for screening mammogram for malignant neoplasm of breast: Secondary | ICD-10-CM | POA: Diagnosis not present

## 2023-02-06 DIAGNOSIS — I2111 ST elevation (STEMI) myocardial infarction involving right coronary artery: Secondary | ICD-10-CM | POA: Diagnosis not present

## 2023-02-06 DIAGNOSIS — Z48812 Encounter for surgical aftercare following surgery on the circulatory system: Secondary | ICD-10-CM | POA: Diagnosis not present

## 2023-02-06 DIAGNOSIS — Z01419 Encounter for gynecological examination (general) (routine) without abnormal findings: Secondary | ICD-10-CM | POA: Diagnosis not present

## 2023-02-08 ENCOUNTER — Encounter (HOSPITAL_COMMUNITY): Admission: RE | Admit: 2023-02-08 | Payer: BC Managed Care – PPO | Source: Ambulatory Visit

## 2023-02-08 ENCOUNTER — Encounter (HOSPITAL_COMMUNITY): Payer: BC Managed Care – PPO

## 2023-02-08 DIAGNOSIS — Z48812 Encounter for surgical aftercare following surgery on the circulatory system: Secondary | ICD-10-CM | POA: Diagnosis not present

## 2023-02-08 DIAGNOSIS — I2111 ST elevation (STEMI) myocardial infarction involving right coronary artery: Secondary | ICD-10-CM

## 2023-02-08 DIAGNOSIS — Z955 Presence of coronary angioplasty implant and graft: Secondary | ICD-10-CM | POA: Diagnosis not present

## 2023-02-08 DIAGNOSIS — E162 Hypoglycemia, unspecified: Secondary | ICD-10-CM | POA: Diagnosis not present

## 2023-02-11 ENCOUNTER — Encounter (HOSPITAL_COMMUNITY): Payer: BC Managed Care – PPO

## 2023-02-11 ENCOUNTER — Encounter (HOSPITAL_COMMUNITY)
Admission: RE | Admit: 2023-02-11 | Discharge: 2023-02-11 | Disposition: A | Discharge status: Home / Self Care | Payer: BC Managed Care – PPO | Source: Ambulatory Visit | Attending: Cardiovascular Disease | Admitting: Cardiovascular Disease

## 2023-02-11 DIAGNOSIS — I2111 ST elevation (STEMI) myocardial infarction involving right coronary artery: Secondary | ICD-10-CM | POA: Diagnosis not present

## 2023-02-11 DIAGNOSIS — E162 Hypoglycemia, unspecified: Secondary | ICD-10-CM | POA: Diagnosis not present

## 2023-02-11 DIAGNOSIS — Z955 Presence of coronary angioplasty implant and graft: Secondary | ICD-10-CM

## 2023-02-11 DIAGNOSIS — L905 Scar conditions and fibrosis of skin: Secondary | ICD-10-CM | POA: Diagnosis not present

## 2023-02-11 DIAGNOSIS — Z48812 Encounter for surgical aftercare following surgery on the circulatory system: Secondary | ICD-10-CM | POA: Diagnosis not present

## 2023-02-13 ENCOUNTER — Encounter (HOSPITAL_COMMUNITY)
Admission: RE | Admit: 2023-02-13 | Discharge: 2023-02-13 | Disposition: A | Discharge status: Home / Self Care | Payer: BC Managed Care – PPO | Source: Ambulatory Visit | Attending: Cardiovascular Disease | Admitting: Cardiovascular Disease

## 2023-02-13 ENCOUNTER — Encounter (HOSPITAL_COMMUNITY): Payer: BC Managed Care – PPO

## 2023-02-13 DIAGNOSIS — I2111 ST elevation (STEMI) myocardial infarction involving right coronary artery: Secondary | ICD-10-CM | POA: Diagnosis not present

## 2023-02-13 DIAGNOSIS — Z955 Presence of coronary angioplasty implant and graft: Secondary | ICD-10-CM | POA: Diagnosis not present

## 2023-02-13 DIAGNOSIS — E162 Hypoglycemia, unspecified: Secondary | ICD-10-CM | POA: Diagnosis not present

## 2023-02-13 DIAGNOSIS — Z48812 Encounter for surgical aftercare following surgery on the circulatory system: Secondary | ICD-10-CM | POA: Diagnosis not present

## 2023-02-13 NOTE — Progress Notes (Signed)
Cardiac Individual Treatment Plan  Patient Details  Name: Teresa Parks MRN: [id number] Date of Birth: 11-22-1970 Referring Provider:   Flowsheet Row INTENSIVE CARDIAC REHAB ORIENT from 01/10/2023 in [facility]  Referring Provider Dr. Tonny Bollman MD       Initial Encounter Date:  Flowsheet Row INTENSIVE CARDIAC REHAB ORIENT from 01/10/2023 in [facility]  Date 01/10/23       Visit Diagnosis: 12/21/22 ST elevation myocardial infarction involving right coronary artery (HCC)  12/21/22 S/P DES Mid RCA  S/P drug eluting coronary stent placement  Patient's Home Medications on Admission:  Current Outpatient Medications:    acetaminophen (TYLENOL) 325 MG tablet, Take 325-650 mg by mouth as needed for headache., Disp: , Rfl:    aspirin 81 MG chewable tablet, Chew 1 tablet (81 mg total) by mouth daily., Disp: , Rfl:    cetirizine (ZYRTEC ALLERGY) 10 MG tablet, Take 1 tablet (10 mg total) by mouth daily., Disp: 90 tablet, Rfl: 1   Continuous Glucose Sensor (FREESTYLE LIBRE 3 SENSOR) MISC, USE AS DIRECTED SUBCUTANEOUS EVERY 15 DAYS for 30 days, Disp: , Rfl:    cyclobenzaprine (FLEXERIL) 10 MG tablet, Take 10 mg by mouth daily as needed for muscle spasms., Disp: , Rfl:    fenofibrate 160 MG tablet, Take 1 tablet (160 mg total) by mouth daily. (Patient taking differently: Take 160 mg by mouth daily in the afternoon.), Disp: 90 tablet, Rfl: 1   fluticasone (FLONASE) 50 MCG/ACT nasal spray, Place 2 sprays into both nostrils daily., Disp: 16 g, Rfl: 6   glimepiride (AMARYL) 2 MG tablet, Take 2 mg by mouth every morning., Disp: , Rfl:    Glucagon (BAQSIMI ONE PACK) 3 MG/DOSE POWD, Place 1 Dose into the nose as needed (low blood sugar)., Disp: , Rfl:    glucose blood test strip, Use as instructed, Disp: 100 each, Rfl: 12   Icosapent Ethyl (VASCEPA) 0.5 g CAPS, Take 3 capsules (1.5 g  total) by mouth daily., Disp: , Rfl:    insulin aspart (NOVOLOG) 100 UNIT/ML injection, Inject 10-30 Units into the skin 3 (three) times daily before meals. Take sliding scale as directed, Disp: , Rfl:    insulin degludec (TRESIBA FLEXTOUCH) 200 UNIT/ML FlexTouch Pen, Take 25 units in the morning and 52 units at night (Patient taking differently: 20 Units in the morning and at bedtime. Take 20 units in the morning and 20 units at night), Disp: 30 mL, Rfl: 3   Lancets (FREESTYLE) lancets, Use as instructed, Disp: 100 each, Rfl: 12   metoprolol succinate (TOPROL-XL) 25 MG 24 hr tablet, Take 1 tablet (25 mg total) by mouth daily., Disp: 90 tablet, Rfl: 3   MOUNJARO 2.5 MG/0.5ML Pen, Inject 2.5 mg into the skin once a week., Disp: , Rfl:    nitroGLYCERIN (NITROSTAT) 0.4 MG SL tablet, Place 1 tablet (0.4 mg total) under the tongue every 5 (five) minutes as needed for chest pain., Disp: 25 tablet, Rfl: 6   olmesartan (BENICAR) 40 MG tablet, Take 1 tablet (40 mg total) by mouth daily., Disp: 90 tablet, Rfl: 3   rosuvastatin (CRESTOR) 10 MG tablet, Take 1 tablet (10 mg total) by mouth daily., Disp: 90 tablet, Rfl: 3   ticagrelor (BRILINTA) 90 MG TABS tablet, Take 1 tablet (90 mg total) by mouth 2 (two) times daily., Disp: 180 tablet, Rfl: 3  Past Medical History: Past Medical History:  Diagnosis Date   Acute ST elevation myocardial infarction (STEMI) of inferior wall (HCC) 12/21/2022   Arthritis    Bell's palsy    hx of 15 years ago approx   CAD in native artery with cardiac cath and DES to [facility] 12/21/22 12/22/2022   Carpal tunnel syndrome    Chronic ear infection    Chronic pancreatitis (HCC)    Diabetes (HCC)    Diabetes mellitus type II, controlled (HCC)    Hemorrhoids    HTN (hypertension)    pt reports b/p runs high when goes to MD office   Hyperlipidemia    Obesity    Pancreatitis, acute     Tobacco Use: Social History   Tobacco Use  Smoking Status Every Day   Current packs/day: 0.50    Average packs/day: 0.5 packs/day for 36.6 years (18.3 ttl pk-yrs)   Types: Cigarettes   Start date: 1988  Smokeless Tobacco Never  Tobacco Comments   01/10/23 Referral faxed to [facility] QUITLINE    Labs: Review Flowsheet  More data exists      Latest Ref Rng & Units 08/17/2021 12/07/2021 01/15/2022 04/10/2022 12/22/2022  Labs for ITP Cardiac and Pulmonary Rehab  Cholestrol 0 - 200 mg/dL 283  - - - 151   LDL (calc) 0 - 99 mg/dL - - - - UNABLE TO CALCULATE IF TRIGLYCERIDE OVER 400 mg/dL   Direct LDL 0 - 99 mg/dL 76.1  - - - 45   HDL-C >40 mg/dL 60.73  - - - 23   Trlycerides <150 mg/dL 710.6 Triglyceride is over 400; calculations on Lipids are invalid.  1667.0 Triglyceride is over 400; calculations on Lipids are invalid.  - - 569   Hemoglobin A1c 4.8 - 5.6 % 9.2  - 12.7  7.7  10.8     Details            Capillary Blood Glucose: Lab Results  Component Value Date   GLUCAP 114 (H) 01/23/2023   GLUCAP 137 (H) 01/16/2023   GLUCAP 86 01/14/2023   GLUCAP 67 (L) 01/14/2023   GLUCAP 190 (H) 12/22/2022     Exercise Target Goals: Exercise Program Goal: Individual exercise prescription set using results from initial 6 min walk test and THRR while considering  patient's activity barriers and safety.   Exercise Prescription Goal: Initial exercise prescription builds to 30-45 minutes a day of aerobic activity, 2-3 days per week.  Home exercise guidelines will be given to patient during program as part of exercise prescription that the participant will acknowledge.  Activity Barriers & Risk Stratification:  Activity Barriers & Cardiac Risk Stratification - 01/10/23 1500       Activity Barriers & Cardiac Risk Stratification   Activity Barriers Deconditioning;Back Problems    Cardiac Risk Stratification High             6 Minute Walk:  6 Minute Walk     Row Name 01/10/23 1455         6 Minute Walk   Phase Initial  CBG Pre 270 via personal CGM     Distance 1370 feet     Walk  Time 6 minutes     # of Rest Breaks 0     MPH 2.6     METS 3.69     RPE 7     Perceived Dyspnea  0     VO2 Peak 12.9     Symptoms No     Resting HR 77 bpm  Resting BP 134/66     Resting Oxygen Saturation  98 %     Exercise Oxygen Saturation  during 6 min walk 99 %     Max Ex. HR 107 bpm     Max Ex. BP 144/72     2 Minute Post BP 138/68              Oxygen Initial Assessment:   Oxygen Re-Evaluation:   Oxygen Discharge (Final Oxygen Re-Evaluation):   Initial Exercise Prescription:  Initial Exercise Prescription - 01/10/23 1400       Date of Initial Exercise RX and Referring Provider   Date 01/10/23    Referring Provider Dr. Tonny Bollman MD    Expected Discharge Date 03/22/23      Treadmill   MPH 2.3    Grade 0    Minutes 15    METs 2.76      Elliptical   Level 1    Speed 1    Minutes 15    METs 3.2      Prescription Details   Frequency (times per week) 3    Duration Progress to 30 minutes of continuous aerobic without signs/symptoms of physical distress      Intensity   THRR 40-80% of Max Heartrate 67-134    Ratings of Perceived Exertion 11-13    Perceived Dyspnea 0-4      Progression   Progression Continue progressive overload as per policy without signs/symptoms or physical distress.      Resistance Training   Training Prescription Yes    Weight 3    Reps 10-15             Perform Capillary Blood Glucose checks as needed.  Exercise Prescription Changes:   Exercise Prescription Changes     Row Name 01/16/23 1628 01/30/23 1642           Response to Exercise   Blood Pressure (Admit) 138/78 124/62      Blood Pressure (Exercise) 220/90 148/80      Blood Pressure (Exit) 124/68 104/60      Heart Rate (Admit) 94 bpm 90 bpm      Heart Rate (Exercise) 127 bpm 114 bpm      Heart Rate (Exit) 103 bpm 97 bpm      Rating of Perceived Exertion (Exercise) 7.5 7.5      Perceived Dyspnea (Exercise) 0 0      Symptoms High BP with  elliptical, d/c and resolved with rest. Recheck 128/78 --      Comments Pt first day on exercise in teh CRP2 program Reviewed MET's, goals and home ExRx      Duration Progress to 30 minutes of  aerobic without signs/symptoms of physical distress Progress to 30 minutes of  aerobic without signs/symptoms of physical distress      Intensity THRR unchanged THRR unchanged        Progression   Progression Continue to progress workloads to maintain intensity without signs/symptoms of physical distress. Continue to progress workloads to maintain intensity without signs/symptoms of physical distress.      Average METs 2.91 2.9        Resistance Training   Training Prescription No No        Treadmill   MPH 2.5 --      Grade 0 0      Minutes 15 15      METs 2.91 2.91        NuStep   Level --  4      SPM -- 112      Minutes -- 15      METs -- 2.9        Elliptical   Level 1 --      Speed 1 --      Minutes 7  D/C after 7 mins due to high BP 220/90, resolved with rest. Recheck 128/78 --        Home Exercise Plan   Plans to continue exercise at -- Home (comment)      Frequency -- Add 2 additional days to program exercise sessions.      Initial Home Exercises Provided -- 01/30/23               Exercise Comments:   Exercise Comments     Row Name 01/14/23 1627 01/16/23 1640 01/30/23 1648       Exercise Comments Pt first day in teh CRP2 program. Pt was unable to exercise today due to low blood sugar, but was able to chat with the RD. Will update exercise changes on patients next visit. Pt first day of exercise in the [facility] ICR program. Pt developed high BP on exercise equipment 220/90 on elliptical, resolved with rest to 128/78 and the treadmill 162/80, resolved with rest to 124/72. Pt was asymptomatic and very motivated to exercise, education given on high BP risk and will assess new equipment mode for next session until BP becomes more managed. REVD MET's, goals and home Exercise. Pt  tolerated exercise well with an average MET level of 2.9. Pt is already exercising 2 days for 30-45 mins per session by playing games with her grandson and using her infinity hoop. Pt feeels good about her goals so far and is gaining stamina. She is also meeting regular with the RD who is helping with diet resources              Exercise Goals and Review:   Exercise Goals     Row Name 01/10/23 1501             Exercise Goals   Increase Physical Activity Yes       Intervention Provide advice, education, support and counseling about physical activity/exercise needs.;Develop an individualized exercise prescription for aerobic and resistive training based on initial evaluation findings, risk stratification, comorbidities and participant's personal goals.       Expected Outcomes Short Term: Attend rehab on a regular basis to increase amount of physical activity.;Long Term: Exercising regularly at least 3-5 days a week.;Long Term: Add in home exercise to make exercise part of routine and to increase amount of physical activity.       Increase Strength and Stamina Yes       Intervention Provide advice, education, support and counseling about physical activity/exercise needs.;Develop an individualized exercise prescription for aerobic and resistive training based on initial evaluation findings, risk stratification, comorbidities and participant's personal goals.       Expected Outcomes Short Term: Increase workloads from initial exercise prescription for resistance, speed, and METs.;Short Term: Perform resistance training exercises routinely during rehab and add in resistance training at home;Long Term: Improve cardiorespiratory fitness, muscular endurance and strength as measured by increased METs and functional capacity ( )       Able to understand and use rate of perceived exertion (RPE) scale Yes       Intervention Provide education and explanation on how to use RPE scale       Expected  Outcomes Short  Term: Able to use RPE daily in rehab to express subjective intensity level;Long Term:  Able to use RPE to guide intensity level when exercising independently       Knowledge and understanding of Target Heart Rate Range (THRR) Yes       Intervention Provide education and explanation of THRR including how the numbers were predicted and where they are located for reference       Expected Outcomes Short Term: Able to state/look up THRR;Long Term: Able to use THRR to govern intensity when exercising independently;Short Term: Able to use daily as guideline for intensity in rehab       Understanding of Exercise Prescription Yes       Intervention Provide education, explanation, and written materials on patient's individual exercise prescription       Expected Outcomes Short Term: Able to explain program exercise prescription;Long Term: Able to explain home exercise prescription to exercise independently                Exercise Goals Re-Evaluation :  Exercise Goals Re-Evaluation     Row Name 01/16/23 1633 01/30/23 1645           Exercise Goal Re-Evaluation   Exercise Goals Review Increase Physical Activity;Understanding of Exercise Prescription;Increase Strength and Stamina;Knowledge and understanding of Target Heart Rate Range (THRR);Able to understand and use rate of perceived exertion (RPE) scale Increase Physical Activity;Understanding of Exercise Prescription;Increase Strength and Stamina;Knowledge and understanding of Target Heart Rate Range (THRR);Able to understand and use rate of perceived exertion (RPE) scale      Comments Pt first day of exercise in the [facility] ICR program. Pt developed high BP on exercise equipment 220/90 on elliptical, resolved with rest to 128/78 and the treadmill 162/80, resolved with rest to 124/72. Pt was asymptomatic and very motivated to exercise, education given on high BP risk and will assess new equipment mode for next session until BP becomes  more managed. REVD MET's, goals and home Exercise. Pt tolerated exercise well with an average MET level of 2.9. Pt is already exercising 2 days for 30-45 mins per session by playing games with her grandson and using her infinity hoop. Pt feeels good about her goals so far and is gaining stamina. She is also meeting regular with the RD who is helping with diet resources      Expected Outcomes Will continue to monitor pt and assess new modes and intensities for exercise Will continue to monitor pt and assess new modes and intensities for exercise               Discharge Exercise Prescription (Final Exercise Prescription Changes):  Exercise Prescription Changes - 01/30/23 1642       Response to Exercise   Blood Pressure (Admit) 124/62    Blood Pressure (Exercise) 148/80    Blood Pressure (Exit) 104/60    Heart Rate (Admit) 90 bpm    Heart Rate (Exercise) 114 bpm    Heart Rate (Exit) 97 bpm    Rating of Perceived Exertion (Exercise) 7.5    Perceived Dyspnea (Exercise) 0    Comments Reviewed MET's, goals and home ExRx    Duration Progress to 30 minutes of  aerobic without signs/symptoms of physical distress    Intensity THRR unchanged      Progression   Progression Continue to progress workloads to maintain intensity without signs/symptoms of physical distress.    Average METs 2.9      Resistance Training   Training Prescription No  Treadmill   Grade 0    Minutes 15    METs 2.91      NuStep   Level 4    SPM 112    Minutes 15    METs 2.9      Home Exercise Plan   Plans to continue exercise at Home (comment)    Frequency Add 2 additional days to program exercise sessions.    Initial Home Exercises Provided 01/30/23             Nutrition:  Target Goals: Understanding of nutrition guidelines, daily intake of sodium 1500mg , cholesterol 200mg , calories 30% from fat and 7% or less from saturated fats, daily to have 5 or more servings of fruits and  vegetables.  Biometrics:  Pre Biometrics - 01/10/23 1453       Pre Biometrics   Waist Circumference 45 inches    Hip Circumference 47 inches    Waist to Hip Ratio 0.96 %    Triceps Skinfold 9 mm    % Body Fat 39.1 %    Grip Strength 38 kg    Flexibility 12.75 in    Single Leg Stand 24.7 seconds              Nutrition Therapy Plan and Nutrition Goals:  Nutrition Therapy & Goals - 01/15/23 1439       Nutrition Therapy   Diet Heart Healthy/Carbohydrate Consistent Diet    Drug/Food Interactions Statins/Certain Fruits      Personal Nutrition Goals   Nutrition Goal Patient to identify strategies for reducing cardiovascular risk by attending the [facility] education and nutrition series weekly.    Personal Goal #2 Patient to improve diet quality by using the plate method as a guide for meal planning to include lean protein/plant protein, fruits, vegetables, whole grains, nonfat dairy as part of a well-balanced diet.    Personal Goal #3 Patient to improve blood sugar control with goal A1c <7%.    Personal Goal #4 Patient to identify food sources and limit intake of saturated fat, trans fat, sodium, and refined carbohydrates.    Comments Teresa Parks has started to make some dietary changes including reduced sodium intake and reduced reguar [facility] to 3 daily. She did start Mounjaro to aid with weight loss and blood sugar control two weeks ago. She continues sliding scale Novolog and Tresiba 54 units twice daily. She also has history of hypertriglyceridemia and pancreatitis. Patient will benefit from participation in intensive cardiac rehab for nutrition, exercise, and lifestyle modification.      Intervention Plan   Intervention Prescribe, educate and counsel regarding individualized specific dietary modifications aiming towards targeted core components such as weight, hypertension, lipid management, diabetes, heart failure and other comorbidities.;Nutrition handout(s) given to patient.     Expected Outcomes Short Term Goal: Understand basic principles of dietary content, such as calories, fat, sodium, cholesterol and nutrients.;Long Term Goal: Adherence to prescribed nutrition plan.             Nutrition Assessments:  MEDIFICTS Score Key: ?70 Need to make dietary changes  40-70 Heart Healthy Diet ? 40 Therapeutic Level Cholesterol Diet    Picture Your Plate Scores: <16 Unhealthy dietary pattern with much room for improvement. 41-50 Dietary pattern unlikely to meet recommendations for good health and room for improvement. 51-60 More healthful dietary pattern, with some room for improvement.  >60 Healthy dietary pattern, although there may be some specific behaviors that could be improved.    Nutrition Goals Re-Evaluation:  Nutrition  Goals Re-Evaluation     Row Name 01/15/23 1439             Goals   Current Weight 212 lb 11.9 oz (96.5 kg)       Comment A1c 10.8,  LDL WNL, triglycerides 569 (taking fenofibrate, Vascepa), HDL 23, lipoproteinA WNL       Expected Outcome Teresa Parks has started to make some dietary changes including reduced sodium intake and reduced reguar [facility] to 3 daily. She did start Mounjaro to aid with weight loss and blood sugar control two weeks ago. She continues sliding scale Novolog and Tresiba 54 units twice daily. She also has history of hypertriglyceridemia and pancreatitis. Patient will benefit from participation in intensive cardiac rehab for nutrition, exercise, and lifestyle modification.                Nutrition Goals Re-Evaluation:  Nutrition Goals Re-Evaluation     Row Name 01/15/23 1439             Goals   Current Weight 212 lb 11.9 oz (96.5 kg)       Comment A1c 10.8,  LDL WNL, triglycerides 569 (taking fenofibrate, Vascepa), HDL 23, lipoproteinA WNL       Expected Outcome Teresa Parks has started to make some dietary changes including reduced sodium intake and reduced reguar [facility] to 3 daily. She did start  Mounjaro to aid with weight loss and blood sugar control two weeks ago. She continues sliding scale Novolog and Tresiba 54 units twice daily. She also has history of hypertriglyceridemia and pancreatitis. Patient will benefit from participation in intensive cardiac rehab for nutrition, exercise, and lifestyle modification.                Nutrition Goals Discharge (Final Nutrition Goals Re-Evaluation):  Nutrition Goals Re-Evaluation - 01/15/23 1439       Goals   Current Weight 212 lb 11.9 oz (96.5 kg)    Comment A1c 10.8,  LDL WNL, triglycerides 569 (taking fenofibrate, Vascepa), HDL 23, lipoproteinA WNL    Expected Outcome Teresa Parks has started to make some dietary changes including reduced sodium intake and reduced reguar [facility] to 3 daily. She did start Mounjaro to aid with weight loss and blood sugar control two weeks ago. She continues sliding scale Novolog and Tresiba 54 units twice daily. She also has history of hypertriglyceridemia and pancreatitis. Patient will benefit from participation in intensive cardiac rehab for nutrition, exercise, and lifestyle modification.             Psychosocial: Target Goals: Acknowledge presence or absence of significant depression and/or stress, maximize coping skills, provide positive support system. Participant is able to verbalize types and ability to use techniques and skills needed for reducing stress and depression.  Initial Review & Psychosocial Screening:  Initial Psych Review & Screening - 01/10/23 1530       Initial Review   Current issues with Current Stress Concerns    Source of Stress Concerns Chronic Illness;Occupation    Comments Teresa Parks reports having stress due to her recent hospitalizaiton due to MI/ STENT      Family Dynamics   Good Support System? Yes   Teresa Parks has her husband and daughter and coworkers for support     Barriers   Psychosocial barriers to participate in program The patient should benefit from training in  stress management and relaxation.      Screening Interventions   Interventions Encouraged to exercise;To provide support and resources with identified  psychosocial needs;Provide feedback about the scores to participant    Expected Outcomes Long Term Goal: Stressors or current issues are controlled or eliminated.;Long Term goal: The participant improves quality of Life and PHQ9 Scores as seen by post scores and/or verbalization of changes             Quality of Life Scores:  Quality of Life - 01/10/23 1503       Quality of Life   Select Quality of Life      Quality of Life Scores   Health/Function Pre 23.67 %    Socioeconomic Pre 22.31 %    Psych/Spiritual Pre 20.71 %    Family Pre 23.4 %    GLOBAL Pre 22.73 %            Scores of 19 and below usually indicate a poorer quality of life in these areas.  A difference of  2-3 points is a clinically meaningful difference.  A difference of 2-3 points in the total score of the Quality of Life Index has been associated with significant improvement in overall quality of life, self-image, physical symptoms, and general health in studies assessing change in quality of life.  PHQ-9: Review Flowsheet  More data exists      01/22/2023 01/10/2023 08/17/2021 04/14/2020 08/22/2018  Depression screen PHQ 2/9  Decreased Interest 0 0 0 0 0  Down, Depressed, Hopeless 0 0 0 0 0  PHQ - 2 Score 0 0 0 0 0  Altered sleeping 1 1 - - 0  Tired, decreased energy 1 1 - - 0  Change in appetite 1 1 - - 0  Feeling bad or failure about yourself  0 - - - 0  Trouble concentrating 0 0 - - 0  Moving slowly or fidgety/restless 0 0 - - 0  Suicidal thoughts 0 0 - - 0  PHQ-9 Score 3 3 - - 0  Difficult doing work/chores Not difficult at all Not difficult at all - - -    Details           Interpretation of Total Score  Total Score Depression Severity:  1-4 = Minimal depression, 5-9 = Mild depression, 10-14 = Moderate depression, 15-19 = Moderately severe  depression, 20-27 = Severe depression   Psychosocial Evaluation and Intervention:   Psychosocial Re-Evaluation:  Psychosocial Re-Evaluation     Row Name 02/13/23 1343             Psychosocial Re-Evaluation   Current issues with Current Stress Concerns       Comments Teresa Parks continues to have concerns regarding her health related to diabetes. Teresa Parks recently had an outpatient procedure       Expected Outcomes Teresa Parks will have decreased or controlled  stress upon completion of cardiac rehab       Interventions Stress management education;Encouraged to attend Cardiac Rehabilitation for the exercise;Relaxation education       Continue Psychosocial Services  Follow up required by staff         Initial Review   Source of Stress Concerns Chronic Illness       Comments will continue to monitor and offer support as needed                Psychosocial Discharge (Final Psychosocial Re-Evaluation):  Psychosocial Re-Evaluation - 02/13/23 1343       Psychosocial Re-Evaluation   Current issues with Current Stress Concerns    Comments Teresa Parks continues to have concerns regarding her health related to diabetes.  Teresa Parks recently had an outpatient procedure    Expected Outcomes Teresa Parks will have decreased or controlled  stress upon completion of cardiac rehab    Interventions Stress management education;Encouraged to attend Cardiac Rehabilitation for the exercise;Relaxation education    Continue Psychosocial Services  Follow up required by staff      Initial Review   Source of Stress Concerns Chronic Illness    Comments will continue to monitor and offer support as needed             Vocational Rehabilitation: Provide vocational rehab assistance to qualifying candidates.   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 Participant will state understanding/return demonstration of topics presented.    Education     Row Name 01/14/23 1500     Education   Cardiac Education Topics Pritikin   Licensed conveyancer Nutrition   Nutrition Calorie Density   Instruction Review Code 1- Verbalizes Understanding   Class Start Time 1400   Class Stop Time 1445   Class Time Calculation (min) 45 min    Row Name 01/16/23 1600     Education   Cardiac Education Topics Pritikin   Customer service manager   Weekly Topic Efficiency Cooking - Meals in a Snap   Instruction Review Code 1- Verbalizes Understanding   Class Start Time 1400   Class Stop Time 1445   Class Time Calculation (min) 45 min    Row Name 01/23/23 1600     Education   Cardiac Education Topics Pritikin   Customer service manager   Weekly Topic One-Pot Wonders   Instruction Review Code 1- Verbalizes Understanding   Class Start Time 1400   Class Stop Time 1440   Class Time Calculation (min) 40 min    Row Name 01/25/23 1500     Education   Cardiac Education Topics Pritikin   Hospital doctor Education   General Education Hypertension and Heart Disease   Instruction Review Code 1- Verbalizes Understanding   Class Start Time 1400   Class Stop Time 1450   Class Time Calculation (min) 50 min    Row Name 01/30/23 1600     Education   Cardiac Education Topics Pritikin   Customer service manager   Weekly Topic Comforting Weekend Breakfasts   Instruction Review Code 1- Verbalizes Understanding   Class Start Time 1400   Class  Stop Time 1445   Class Time Calculation (min) 45 min    Row Name 02/06/23 1500     Education   Cardiac Education Topics Pritikin   Customer service manager   Weekly Topic Fast Evening Meals   Instruction Review Code 1- Verbalizes Understanding   Class Start Time 1400   Class  Stop Time 1440   Class Time Calculation (min) 40 min    Row Name 02/08/23 1500     Education   Cardiac Education Topics Pritikin   Licensed conveyancer Nutrition   Nutrition Vitamins and Minerals   Instruction Review Code 1- Verbalizes Understanding   Class Start Time 1400   Class Stop Time 1445   Class Time Calculation (min) 45 min    Row Name 02/11/23 1600     Education   Cardiac Education Topics Pritikin   Geographical information systems officer Psychosocial   Psychosocial Workshop Healthy Sleep for a Healthy Heart   Instruction Review Code 1- Verbalizes Understanding   Class Start Time 1400   Class Stop Time 1455   Class Time Calculation (min) 55 min            Core Videos: Exercise    Move It!  Clinical staff conducted group or individual video education with verbal and written material and guidebook.  Patient learns the recommended Pritikin exercise program. Exercise with the goal of living a long, healthy life. Some of the health benefits of exercise include controlled diabetes, healthier blood pressure levels, improved cholesterol levels, improved heart and lung capacity, improved sleep, and better body composition. Everyone should speak with their doctor before starting or changing an exercise routine.  Biomechanical Limitations Clinical staff conducted group or individual video education with verbal and written material and guidebook.  Patient learns how biomechanical limitations can impact exercise and how we can mitigate and possibly overcome limitations to have  an impactful and balanced exercise routine.  Body Composition Clinical staff conducted group or individual video education with verbal and written material and guidebook.  Patient learns that body composition (ratio of muscle mass to fat mass) is a key component to assessing overall fitness, rather than body weight alone. Increased fat mass, especially visceral belly fat, can put [place] at increased risk for metabolic syndrome, type 2 diabetes, heart disease, and even death. It is recommended to combine diet and exercise (cardiovascular and resistance training) to improve your body composition. Seek guidance from your physician and exercise physiologist before implementing an exercise routine.  Exercise Action Plan Clinical staff conducted group or individual video education with verbal and written material and guidebook.  Patient learns the recommended strategies to achieve and enjoy long-term exercise adherence, including variety, self-motivation, self-efficacy, and positive decision making. Benefits of exercise include fitness, good health, weight management, more energy, better sleep, less stress, and overall well-being.  Medical   Heart Disease Risk Reduction Clinical staff conducted group or individual video education with verbal and written material and guidebook.  Patient learns our heart is our most vital organ as it circulates oxygen, nutrients, white blood cells, and hormones throughout the entire body, and carries waste away. Data supports a plant-based eating plan like the Pritikin Program for its effectiveness in slowing progression of and reversing heart disease. The video provides a number of recommendations to address heart disease.   Metabolic Syndrome and Belly Fat  Clinical staff conducted group or individual video education with verbal and written material and guidebook.  Patient learns what metabolic syndrome is, how it leads to heart disease, and how one can reverse it and keep it  from coming back. You have metabolic syndrome if you have 3 of the following 5 criteria: abdominal obesity, high blood  pressure, high triglycerides, low HDL cholesterol, and high blood sugar.  Hypertension and Heart Disease Clinical staff conducted group or individual video education with verbal and written material and guidebook.  Patient learns that high blood pressure, or hypertension, is very common in the [place]. Hypertension is largely due to excessive salt intake, but other important risk factors include being overweight, physical inactivity, drinking too much alcohol, smoking, and not eating enough potassium from fruits and vegetables. High blood pressure is a leading risk factor for heart attack, stroke, congestive heart failure, dementia, kidney failure, and premature death. Long-term effects of excessive salt intake include stiffening of the arteries and thickening of heart muscle and organ damage. Recommendations include ways to reduce hypertension and the risk of heart disease.  Diseases of Our Time - Focusing on Diabetes Clinical staff conducted group or individual video education with verbal and written material and guidebook.  Patient learns why the best way to stop diseases of our time is prevention, through food and other lifestyle changes. Medicine (such as prescription pills and surgeries) is often only a Band-Aid on the problem, not a long-term solution. Most common diseases of our time include obesity, type 2 diabetes, hypertension, heart disease, and cancer. The Pritikin Program is recommended and has been proven to help reduce, reverse, and/or prevent the damaging effects of metabolic syndrome.  Nutrition   Overview of the Pritikin Eating Plan  Clinical staff conducted group or individual video education with verbal and written material and guidebook.  Patient learns about the Pritikin Eating Plan for disease risk reduction. The Pritikin Eating Plan emphasizes a wide  variety of unrefined, minimally-processed carbohydrates, like fruits, vegetables, whole grains, and legumes. Go, Caution, and Stop food choices are explained. Plant-based and lean animal proteins are emphasized. Rationale provided for low sodium intake for blood pressure control, low added sugars for blood sugar stabilization, and low added fats and oils for coronary artery disease risk reduction and weight management.  Calorie Density  Clinical staff conducted group or individual video education with verbal and written material and guidebook.  Patient learns about calorie density and how it impacts the Pritikin Eating Plan. Knowing the characteristics of the food you choose will help you decide whether those foods will lead to weight gain or weight loss, and whether you want to consume more or less of them. Weight loss is usually a side effect of the Pritikin Eating Plan because of its focus on low calorie-dense foods.  Label Reading  Clinical staff conducted group or individual video education with verbal and written material and guidebook.  Patient learns about the Pritikin recommended label reading guidelines and corresponding recommendations regarding calorie density, added sugars, sodium content, and whole grains.  Dining Out - Part 1  Clinical staff conducted group or individual video education with verbal and written material and guidebook.  Patient learns that restaurant meals can be sabotaging because they can be so high in calories, fat, sodium, and/or sugar. Patient learns recommended strategies on how to positively address this and avoid unhealthy pitfalls.  Facts on Fats  Clinical staff conducted group or individual video education with verbal and written material and guidebook.  Patient learns that lifestyle modifications can be just as effective, if not more so, as many medications for lowering your risk of heart disease. A Pritikin lifestyle can help to reduce your risk of  inflammation and atherosclerosis (cholesterol build-up, or plaque, in the artery walls). Lifestyle interventions such as dietary choices and physical activity address the  cause of atherosclerosis. A review of the types of fats and their impact on blood cholesterol levels, along with dietary recommendations to reduce fat intake is also included.  Nutrition Action Plan  Clinical staff conducted group or individual video education with verbal and written material and guidebook.  Patient learns how to incorporate Pritikin recommendations into their lifestyle. Recommendations include planning and keeping personal health goals in mind as an important part of their success.  Healthy Mind-Set    Healthy Minds, Bodies, Hearts  Clinical staff conducted group or individual video education with verbal and written material and guidebook.  Patient learns how to identify when they are stressed. Video will discuss the impact of that stress, as well as the many benefits of stress management. Patient will also be introduced to stress management techniques. The way we think, act, and feel has an impact on our hearts.  How Our Thoughts Can Heal Our Hearts  Clinical staff conducted group or individual video education with verbal and written material and guidebook.  Patient learns that negative thoughts can cause depression and anxiety. This can result in negative lifestyle behavior and serious health problems. Cognitive behavioral therapy is an effective method to help control our thoughts in order to change and improve our emotional outlook.  Additional Videos:  Exercise    Improving Performance  Clinical staff conducted group or individual video education with verbal and written material and guidebook.  Patient learns to use a non-linear approach by alternating intensity levels and lengths of time spent exercising to help burn more calories and lose more body fat. Cardiovascular exercise helps improve heart health,  metabolism, hormonal balance, blood sugar control, and recovery from fatigue. Resistance training improves strength, endurance, balance, coordination, reaction time, metabolism, and muscle mass. Flexibility exercise improves circulation, posture, and balance. Seek guidance from your physician and exercise physiologist before implementing an exercise routine and learn your capabilities and proper form for all exercise.  Introduction to Yoga  Clinical staff conducted group or individual video education with verbal and written material and guidebook.  Patient learns about yoga, a discipline of the coming together of mind, breath, and body. The benefits of yoga include improved flexibility, improved range of motion, better posture and core strength, increased lung function, weight loss, and positive self-image. Yoga's heart health benefits include lowered blood pressure, healthier heart rate, decreased cholesterol and triglyceride levels, improved immune function, and reduced stress. Seek guidance from your physician and exercise physiologist before implementing an exercise routine and learn your capabilities and proper form for all exercise.  Medical   Aging: Enhancing Your Quality of Life  Clinical staff conducted group or individual video education with verbal and written material and guidebook.  Patient learns key strategies and recommendations to stay in good physical health and enhance quality of life, such as prevention strategies, having an advocate, securing a Health Care Proxy and Power of Attorney, and keeping a list of medications and system for tracking them. It also discusses how to avoid risk for bone loss.  Biology of Weight Control  Clinical staff conducted group or individual video education with verbal and written material and guidebook.  Patient learns that weight gain occurs because we consume more calories than we burn (eating more, moving less). Even if your body weight is normal, you  may have higher ratios of fat compared to muscle mass. Too much body fat puts you at increased risk for cardiovascular disease, heart attack, stroke, type 2 diabetes, and obesity-related cancers. In addition  to exercise, following the Pritikin Eating Plan can help reduce your risk.  Decoding Lab Results  Clinical staff conducted group or individual video education with verbal and written material and guidebook.  Patient learns that lab test reflects one measurement whose values change over time and are influenced by many factors, including medication, stress, sleep, exercise, food, hydration, pre-existing medical conditions, and more. It is recommended to use the knowledge from this video to become more involved with your lab results and evaluate your numbers to speak with your doctor.   Diseases of Our Time - Overview  Clinical staff conducted group or individual video education with verbal and written material and guidebook.  Patient learns that according to the CDC, 50% to 70% of chronic diseases (such as obesity, type 2 diabetes, elevated lipids, hypertension, and heart disease) are avoidable through lifestyle improvements including healthier food choices, listening to satiety cues, and increased physical activity.  Sleep Disorders Clinical staff conducted group or individual video education with verbal and written material and guidebook.  Patient learns how good quality and duration of sleep are important to overall health and well-being. Patient also learns about sleep disorders and how they impact health along with recommendations to address them, including discussing with a physician.  Nutrition  Dining Out - Part 2 Clinical staff conducted group or individual video education with verbal and written material and guidebook.  Patient learns how to plan ahead and communicate in order to maximize their dining experience in a healthy and nutritious manner. Included are recommended food choices  based on the type of restaurant the patient is visiting.   Fueling a Banker conducted group or individual video education with verbal and written material and guidebook.  There is a strong connection between our food choices and our health. Diseases like obesity and type 2 diabetes are very prevalent and are in large-part due to lifestyle choices. The Pritikin Eating Plan provides plenty of food and hunger-curbing satisfaction. It is easy to follow, affordable, and helps reduce health risks.  Menu Workshop  Clinical staff conducted group or individual video education with verbal and written material and guidebook.  Patient learns that restaurant meals can sabotage health goals because they are often packed with calories, fat, sodium, and sugar. Recommendations include strategies to plan ahead and to communicate with the manager, chef, or server to help order a healthier meal.  Planning Your Eating Strategy  Clinical staff conducted group or individual video education with verbal and written material and guidebook.  Patient learns about the Pritikin Eating Plan and its benefit of reducing the risk of disease. The Pritikin Eating Plan does not focus on calories. Instead, it emphasizes high-quality, nutrient-rich foods. By knowing the characteristics of the foods, we choose, we can determine their calorie density and make informed decisions.  Targeting Your Nutrition Priorities  Clinical staff conducted group or individual video education with verbal and written material and guidebook.  Patient learns that lifestyle habits have a tremendous impact on disease risk and progression. This video provides eating and physical activity recommendations based on your personal health goals, such as reducing LDL cholesterol, losing weight, preventing or controlling type 2 diabetes, and reducing high blood pressure.  Vitamins and Minerals  Clinical staff conducted group or individual video  education with verbal and written material and guidebook.  Patient learns different ways to obtain key vitamins and minerals, including through a recommended healthy diet. It is important to discuss all supplements you take with  your doctor.   Healthy Mind-Set    Smoking Cessation  Clinical staff conducted group or individual video education with verbal and written material and guidebook.  Patient learns that cigarette smoking and tobacco addiction pose a serious health risk which affects millions of people. Stopping smoking will significantly reduce the risk of heart disease, lung disease, and many forms of cancer. Recommended strategies for quitting are covered, including working with your doctor to develop a successful plan.  Culinary   Becoming a Set designer conducted group or individual video education with verbal and written material and guidebook.  Patient learns that cooking at home can be healthy, cost-effective, quick, and puts them in control. Keys to cooking healthy recipes will include looking at your recipe, assessing your equipment needs, planning ahead, making it simple, choosing cost-effective seasonal ingredients, and limiting the use of added fats, salts, and sugars.  Cooking - Breakfast and Snacks  Clinical staff conducted group or individual video education with verbal and written material and guidebook.  Patient learns how important breakfast is to satiety and nutrition through the entire day. Recommendations include key foods to eat during breakfast to help stabilize blood sugar levels and to prevent overeating at meals later in the day. Planning ahead is also a key component.  Cooking - Educational psychologist conducted group or individual video education with verbal and written material and guidebook.  Patient learns eating strategies to improve overall health, including an approach to cook more at home. Recommendations include thinking of  animal protein as a side on your plate rather than center stage and focusing instead on lower calorie dense options like vegetables, fruits, whole grains, and plant-based proteins, such as beans. Making sauces in large quantities to freeze for later and leaving the skin on your vegetables are also recommended to maximize your experience.  Cooking - Healthy Salads and Dressing Clinical staff conducted group or individual video education with verbal and written material and guidebook.  Patient learns that vegetables, fruits, whole grains, and legumes are the foundations of the Pritikin Eating Plan. Recommendations include how to incorporate each of these in flavorful and healthy salads, and how to create homemade salad dressings. Proper handling of ingredients is also covered. Cooking - Soups and [employer] - Soups and Desserts Clinical staff conducted group or individual video education with verbal and written material and guidebook.  Patient learns that Pritikin soups and desserts make for easy, nutritious, and delicious snacks and meal components that are low in sodium, fat, sugar, and calorie density, while high in vitamins, minerals, and filling fiber. Recommendations include simple and healthy ideas for soups and desserts.   Overview     The Pritikin Solution Program Overview Clinical staff conducted group or individual video education with verbal and written material and guidebook.  Patient learns that the results of the Pritikin Program have been documented in more than 100 articles published in peer-reviewed journals, and the benefits include reducing risk factors for (and, in some cases, even reversing) high cholesterol, high blood pressure, type 2 diabetes, obesity, and more! An overview of the three key pillars of the Pritikin Program will be covered: eating well, doing regular exercise, and having a healthy mind-set.  WORKSHOPS  Exercise: Exercise Basics: Building Your Action  Plan Clinical staff led group instruction and group discussion with PowerPoint presentation and patient guidebook. To enhance the learning environment the use of posters, models and videos may be added. At the conclusion  of this workshop, patients will comprehend the difference between physical activity and exercise, as well as the benefits of incorporating both, into their routine. Patients will understand the FITT (Frequency, Intensity, Time, and Type) principle and how to use it to build an exercise action plan. In addition, safety concerns and other considerations for exercise and cardiac rehab will be addressed by the presenter. The purpose of this lesson is to promote a comprehensive and effective weekly exercise routine in order to improve patients' overall level of fitness.   Managing Heart Disease: Your Path to a Healthier Heart Clinical staff led group instruction and group discussion with PowerPoint presentation and patient guidebook. To enhance the learning environment the use of posters, models and videos may be added.At the conclusion of this workshop, patients will understand the anatomy and physiology of the heart. Additionally, they will understand how Pritikin's three pillars impact the risk factors, the progression, and the management of heart disease.  The purpose of this lesson is to provide a high-level overview of the heart, heart disease, and how the Pritikin lifestyle positively impacts risk factors.  Exercise Biomechanics Clinical staff led group instruction and group discussion with PowerPoint presentation and patient guidebook. To enhance the learning environment the use of posters, models and videos may be added. Patients will learn how the structural parts of their bodies function and how these functions impact their daily activities, movement, and exercise. Patients will learn how to promote a neutral spine, learn how to manage pain, and identify ways to improve  their physical movement in order to promote healthy living. The purpose of this lesson is to expose patients to common physical limitations that impact physical activity. Participants will learn practical ways to adapt and manage aches and pains, and to minimize their effect on regular exercise. Patients will learn how to maintain good posture while sitting, walking, and lifting.  Balance Training and Fall Prevention  Clinical staff led group instruction and group discussion with PowerPoint presentation and patient guidebook. To enhance the learning environment the use of posters, models and videos may be added. At the conclusion of this workshop, patients will understand the importance of their sensorimotor skills (vision, proprioception, and the vestibular system) in maintaining their ability to balance as they age. Patients will apply a variety of balancing exercises that are appropriate for their current level of function. Patients will understand the common causes for poor balance, possible solutions to these problems, and ways to modify their physical environment in order to minimize their fall risk. The purpose of this lesson is to teach patients about the importance of maintaining balance as they age and ways to minimize their risk of falling.  WORKSHOPS   Nutrition:  Fueling a Ship broker led group instruction and group discussion with PowerPoint presentation and patient guidebook. To enhance the learning environment the use of posters, models and videos may be added. Patients will review the foundational principles of the Pritikin Eating Plan and understand what constitutes a serving size in each of the food groups. Patients will also learn Pritikin-friendly foods that are better choices when away from home and review make-ahead meal and snack options. Calorie density will be reviewed and applied to three nutrition priorities: weight maintenance, weight loss, and weight  gain. The purpose of this lesson is to reinforce (in a group setting) the key concepts around what patients are recommended to eat and how to apply these guidelines when away from home by planning and selecting Pritikin-friendly options.  Patients will understand how calorie density may be adjusted for different weight management goals.  Mindful Eating  Clinical staff led group instruction and group discussion with PowerPoint presentation and patient guidebook. To enhance the learning environment the use of posters, models and videos may be added. Patients will briefly review the concepts of the Pritikin Eating Plan and the importance of low-calorie dense foods. The concept of mindful eating will be introduced as well as the importance of paying attention to internal hunger signals. Triggers for non-hunger eating and techniques for dealing with triggers will be explored. The purpose of this lesson is to provide patients with the opportunity to review the basic principles of the Pritikin Eating Plan, discuss the value of eating mindfully and how to measure internal cues of hunger and fullness using the Hunger Scale. Patients will also discuss reasons for non-hunger eating and learn strategies to use for controlling emotional eating.  Targeting Your Nutrition Priorities Clinical staff led group instruction and group discussion with PowerPoint presentation and patient guidebook. To enhance the learning environment the use of posters, models and videos may be added. Patients will learn how to determine their genetic susceptibility to disease by reviewing their family history. Patients will gain insight into the importance of diet as part of an overall healthy lifestyle in mitigating the impact of genetics and other environmental insults. The purpose of this lesson is to provide patients with the opportunity to assess their personal nutrition priorities by looking at their family history, their own health history  and current risk factors. Patients will also be able to discuss ways of prioritizing and modifying the Pritikin Eating Plan for their highest risk areas  Menu  Clinical staff led group instruction and group discussion with PowerPoint presentation and patient guidebook. To enhance the learning environment the use of posters, models and videos may be added. Using menus brought in from [employer], or printed from [employer], patients will apply the Pritikin dining out guidelines that were presented in the [employer] video. Patients will also be able to practice these guidelines in a variety of provided scenarios. The purpose of this lesson is to provide patients with the opportunity to practice hands-on learning of the Pritikin Dining Out guidelines with actual menus and practice scenarios.  Label Reading Clinical staff led group instruction and group discussion with PowerPoint presentation and patient guidebook. To enhance the learning environment the use of posters, models and videos may be added. Patients will review and discuss the Pritikin label reading guidelines presented in Pritikin's Label Reading Educational series video. Using fool labels brought in from local grocery stores and markets, patients will apply the label reading guidelines and determine if the packaged food meet the Pritikin guidelines. The purpose of this lesson is to provide patients with the opportunity to review, discuss, and practice hands-on learning of the Pritikin Label Reading guidelines with actual packaged food labels. Cooking School  Pritikin's [employer] are designed to teach patients ways to prepare quick, simple, and affordable recipes at home. The importance of nutrition's role in chronic disease risk reduction is reflected in its emphasis in the overall Pritikin program. By learning how to prepare essential core Pritikin Eating Plan recipes, patients will increase control over  what they eat; be able to customize the flavor of foods without the use of added salt, sugar, or fat; and improve the quality of the food they consume. By learning a set of core recipes which are easily assembled, quickly  prepared, and affordable, patients are more likely to prepare more healthy foods at home. These workshops focus on convenient breakfasts, simple entres, side dishes, and desserts which can be prepared with minimal effort and are consistent with nutrition recommendations for cardiovascular risk reduction. Cooking [employer] are taught by a Armed forces logistics/support/administrative officer (RD) who has been trained by the [employer]. The chef or RD has a clear understanding of the importance of minimizing - if not completely eliminating - added fat, sugar, and sodium in recipes. Throughout the series of Cooking School Workshop sessions, patients will learn about healthy ingredients and efficient methods of cooking to build confidence in their capability to prepare    Cooking School weekly topics:  Adding Flavor- Sodium-Free  Fast and Healthy Breakfasts  Powerhouse Plant-Based Proteins  Satisfying Salads and Dressings  Simple Sides and Sauces  International Cuisine-Spotlight on the [employer] Zones  Delicious Desserts  Savory Soups  [employer] - Meals in a Astronomer Appetizers and Snacks  Comforting Weekend Breakfasts  One-Pot Wonders   Fast Evening Meals  Landscape architect Your Pritikin Plate  WORKSHOPS   Healthy Mindset (Psychosocial):  Focused Goals, Sustainable Changes Clinical staff led group instruction and group discussion with PowerPoint presentation and patient guidebook. To enhance the learning environment the use of posters, models and videos may be added. Patients will be able to apply effective goal setting strategies to establish at least one personal goal, and then take consistent, meaningful action toward that goal. They will learn to  identify common barriers to achieving personal goals and develop strategies to overcome them. Patients will also gain an understanding of how our mind-set can impact our ability to achieve goals and the importance of cultivating a positive and growth-oriented mind-set. The purpose of this lesson is to provide patients with a deeper understanding of how to set and achieve personal goals, as well as the tools and strategies needed to overcome common obstacles which may arise along the way.  From Head to Heart: The Power of a Healthy Outlook  Clinical staff led group instruction and group discussion with PowerPoint presentation and patient guidebook. To enhance the learning environment the use of posters, models and videos may be added. Patients will be able to recognize and describe the impact of emotions and mood on physical health. They will discover the importance of self-care and explore self-care practices which may work for them. Patients will also learn how to utilize the 4 C's to cultivate a healthier outlook and better manage stress and challenges. The purpose of this lesson is to demonstrate to patients how a healthy outlook is an essential part of maintaining good health, especially as they continue their cardiac rehab journey.  Healthy Sleep for a Healthy Heart Clinical staff led group instruction and group discussion with PowerPoint presentation and patient guidebook. To enhance the learning environment the use of posters, models and videos may be added. At the conclusion of this workshop, patients will be able to demonstrate knowledge of the importance of sleep to overall health, well-being, and quality of life. They will understand the symptoms of, and treatments for, common sleep disorders. Patients will also be able to identify daytime and nighttime behaviors which impact sleep, and they will be able to apply these tools to help manage sleep-related challenges. The purpose of this lesson is to  provide patients with a general overview of sleep and outline the importance of quality sleep. Patients will learn about a  few of the most common sleep disorders. Patients will also be introduced to the concept of "sleep hygiene," and discover ways to self-manage certain sleeping problems through simple daily behavior changes. Finally, the workshop will motivate patients by clarifying the links between quality sleep and their goals of heart-healthy living.   Recognizing and Reducing Stress Clinical staff led group instruction and group discussion with PowerPoint presentation and patient guidebook. To enhance the learning environment the use of posters, models and videos may be added. At the conclusion of this workshop, patients will be able to understand the types of stress reactions, differentiate between acute and chronic stress, and recognize the impact that chronic stress has on their health. They will also be able to apply different coping mechanisms, such as reframing negative self-talk. Patients will have the opportunity to practice a variety of stress management techniques, such as deep abdominal breathing, progressive muscle relaxation, and/or guided imagery.  The purpose of this lesson is to educate patients on the role of stress in their lives and to provide healthy techniques for coping with it.  Learning Barriers/Preferences:  Learning Barriers/Preferences - 01/10/23 1503       Learning Barriers/Preferences   Learning Barriers None    Learning Preferences Group Instruction;Individual Instruction;Skilled Demonstration             Education Topics:  Knowledge Questionnaire Score:  Knowledge Questionnaire Score - 01/10/23 1504       Knowledge Questionnaire Score   Pre Score 19/24             Core Components/Risk Factors/Patient Goals at Admission:  Personal Goals and Risk Factors at Admission - 01/10/23 1504       Core Components/Risk Factors/Patient Goals on Admission     Weight Management Yes;Obesity;Weight Loss    Intervention Weight Management: Develop a combined nutrition and exercise program designed to reach desired caloric intake, while maintaining appropriate intake of nutrient and fiber, sodium and fats, and appropriate energy expenditure required for the weight goal.;Weight Management: Provide education and appropriate resources to help participant work on and attain dietary goals.;Weight Management/Obesity: Establish reasonable short term and long term weight goals.;Obesity: Provide education and appropriate resources to help participant work on and attain dietary goals.    Admit Weight 212 lb 11.9 oz (96.5 kg)    Expected Outcomes Short Term: Continue to assess and modify interventions until short term weight is achieved;Long Term: Adherence to nutrition and physical activity/exercise program aimed toward attainment of established weight goal;Weight Loss: Understanding of general recommendations for a balanced deficit meal plan, which promotes 1-2 lb weight loss per week and includes a negative energy balance of [phone] kcal/d;Understanding recommendations for meals to include 15-35% energy as protein, 25-35% energy from fat, 35-60% energy from carbohydrates, less than 200mg  of dietary cholesterol, 20-35 gm of total fiber daily;Understanding of distribution of calorie intake throughout the day with the consumption of 4-5 meals/snacks    Tobacco Cessation Yes    Number of packs per day 1/2 PPD   Referral faxed to [facility] Quitline. Patient says she is ready to quit and has cut back   Intervention Assist the participant in steps to quit. Provide individualized education and counseling about committing to Tobacco Cessation, relapse prevention, and pharmacological support that can be provided by physician.;Education officer, environmental, assist with locating and accessing local/national Quit Smoking programs, and support quit date choice.    Expected Outcomes Short Term:  Will demonstrate readiness to quit, by selecting a quit date.;Short Term: Will quit  all tobacco product use, adhering to prevention of relapse plan.;Long Term: Complete abstinence from all tobacco products for at least 12 months from quit date.    Diabetes Yes    Intervention Provide education about signs/symptoms and action to take for hypo/hyperglycemia.;Provide education about proper nutrition, including hydration, and aerobic/resistive exercise prescription along with prescribed medications to achieve blood glucose in normal ranges: Fasting glucose 65-99 mg/dL    Expected Outcomes Short Term: Participant verbalizes understanding of the signs/symptoms and immediate care of hyper/hypoglycemia, proper foot care and importance of medication, aerobic/resistive exercise and nutrition plan for blood glucose control.;Long Term: Attainment of HbA1C < 7%.    Hypertension Yes    Intervention Monitor prescription use compliance.;Provide education on lifestyle modifcations including regular physical activity/exercise, weight management, moderate sodium restriction and increased consumption of fresh fruit, vegetables, and low fat dairy, alcohol moderation, and smoking cessation.    Expected Outcomes Short Term: Continued assessment and intervention until BP is < 140/2mm HG in hypertensive participants. < 130/37mm HG in hypertensive participants with diabetes, heart failure or chronic kidney disease.;Long Term: Maintenance of blood pressure at goal levels.    Lipids Yes    Intervention Provide education and support for participant on nutrition & aerobic/resistive exercise along with prescribed medications to achieve LDL 70mg , HDL >40mg .    Expected Outcomes Short Term: Participant states understanding of desired cholesterol values and is compliant with medications prescribed. Participant is following exercise prescription and nutrition guidelines.;Long Term: Cholesterol controlled with medications as prescribed,  with individualized exercise RX and with personalized nutrition plan. Value goals: LDL < 70mg , HDL > 40 mg.    Stress Yes    Intervention Offer individual and/or small group education and counseling on adjustment to heart disease, stress management and health-related lifestyle change. Teach and support self-help strategies.;Refer participants experiencing significant psychosocial distress to appropriate mental health specialists for further evaluation and treatment. When possible, include family members and significant others in education/counseling sessions.    Expected Outcomes Short Term: Participant demonstrates changes in health-related behavior, relaxation and other stress management skills, ability to obtain effective social support, and compliance with psychotropic medications if prescribed.;Long Term: Emotional wellbeing is indicated by absence of clinically significant psychosocial distress or social isolation.    Personal Goal Other Yes    Personal Goal Short term:modification with soda drinking Long term: stamina wt loss (60 lbs)    Intervention Will continue to monitor pt and progress workloads as tolerated without sign or symptom    Expected Outcomes Pt will achieve her goals             Core Components/Risk Factors/Patient Goals Review:   Goals and Risk Factor Review     Row Name 02/13/23 1348             Core Components/Risk Factors/Patient Goals Review   Personal Goals Review Weight Management/Obesity;Lipids;Diabetes;Stress;Hypertension       Review Teresa Parks has been doing well with exercise. Vital signs and CBG's have been stable. occasional exertional BP elevations have been noted. Teresa Parks says she is trying to cut back on smoking cigarettes.       Expected Outcomes Teresa Parks will continue to participate in cardiac rehab for exercise, nutrition and lifestyle modifications                Core Components/Risk Factors/Patient Goals at Discharge (Final Review):   Goals and  Risk Factor Review - 02/13/23 1348       Core Components/Risk Factors/Patient Goals Review   Personal  Goals Review Weight Management/Obesity;Lipids;Diabetes;Stress;Hypertension    Review Teresa Parks has been doing well with exercise. Vital signs and CBG's have been stable. occasional exertional BP elevations have been noted. Teresa Parks says she is trying to cut back on smoking cigarettes.    Expected Outcomes Teresa Parks will continue to participate in cardiac rehab for exercise, nutrition and lifestyle modifications             ITP Comments:  ITP Comments     Row Name 01/10/23 1323 01/14/23 1706 02/13/23 1339       ITP Comments Armanda Magic, MD:  Medical Director.  Introduction to the [employer] / Intensive Cardiac Rehab.  Initial orientation packet reviewed with the patient. 30 Day ITP Review. Deseri did not start exercise at cardiac rehab on 01/14/23 due to hypoglycemia. Plans to return to exercise on Wednesday. 30 Day ITP Review. Tristin has good attendance and participation in exercise at cardiac rehab              Comments: See ITP comments.Thayer Headings RN BSN

## 2023-02-15 ENCOUNTER — Encounter (HOSPITAL_COMMUNITY): Payer: BC Managed Care – PPO

## 2023-02-15 ENCOUNTER — Encounter (HOSPITAL_COMMUNITY)
Admission: RE | Admit: 2023-02-15 | Discharge: 2023-02-15 | Disposition: A | Discharge status: Home / Self Care | Payer: BC Managed Care – PPO | Source: Ambulatory Visit | Attending: Cardiovascular Disease | Admitting: Cardiovascular Disease

## 2023-02-15 ENCOUNTER — Other Ambulatory Visit: Payer: Self-pay

## 2023-02-15 DIAGNOSIS — Z79899 Other long term (current) drug therapy: Secondary | ICD-10-CM | POA: Diagnosis not present

## 2023-02-15 DIAGNOSIS — E1169 Type 2 diabetes mellitus with other specified complication: Secondary | ICD-10-CM | POA: Diagnosis not present

## 2023-02-15 DIAGNOSIS — E781 Pure hyperglyceridemia: Secondary | ICD-10-CM | POA: Diagnosis not present

## 2023-02-15 DIAGNOSIS — I2111 ST elevation (STEMI) myocardial infarction involving right coronary artery: Secondary | ICD-10-CM | POA: Insufficient documentation

## 2023-02-15 DIAGNOSIS — E785 Hyperlipidemia, unspecified: Secondary | ICD-10-CM | POA: Diagnosis not present

## 2023-02-15 DIAGNOSIS — I10 Essential (primary) hypertension: Secondary | ICD-10-CM | POA: Diagnosis not present

## 2023-02-15 DIAGNOSIS — Z955 Presence of coronary angioplasty implant and graft: Secondary | ICD-10-CM | POA: Diagnosis not present

## 2023-02-15 DIAGNOSIS — E1165 Type 2 diabetes mellitus with hyperglycemia: Secondary | ICD-10-CM | POA: Diagnosis not present

## 2023-02-18 ENCOUNTER — Encounter (HOSPITAL_COMMUNITY)
Admission: RE | Admit: 2023-02-18 | Discharge: 2023-02-18 | Disposition: A | Discharge status: Home / Self Care | Payer: BC Managed Care – PPO | Source: Ambulatory Visit | Attending: Cardiovascular Disease | Admitting: Cardiovascular Disease

## 2023-02-18 ENCOUNTER — Encounter (HOSPITAL_COMMUNITY): Payer: BC Managed Care – PPO

## 2023-02-18 DIAGNOSIS — Z955 Presence of coronary angioplasty implant and graft: Secondary | ICD-10-CM | POA: Diagnosis not present

## 2023-02-18 DIAGNOSIS — I2111 ST elevation (STEMI) myocardial infarction involving right coronary artery: Secondary | ICD-10-CM | POA: Diagnosis not present

## 2023-02-20 ENCOUNTER — Encounter (HOSPITAL_COMMUNITY)
Admission: RE | Admit: 2023-02-20 | Discharge: 2023-02-20 | Disposition: A | Discharge status: Home / Self Care | Payer: BC Managed Care – PPO | Source: Ambulatory Visit | Attending: Cardiovascular Disease | Admitting: Cardiovascular Disease

## 2023-02-20 ENCOUNTER — Encounter (HOSPITAL_COMMUNITY): Payer: BC Managed Care – PPO

## 2023-02-20 DIAGNOSIS — Z955 Presence of coronary angioplasty implant and graft: Secondary | ICD-10-CM | POA: Diagnosis not present

## 2023-02-20 DIAGNOSIS — I2111 ST elevation (STEMI) myocardial infarction involving right coronary artery: Secondary | ICD-10-CM | POA: Diagnosis not present

## 2023-02-22 ENCOUNTER — Encounter (HOSPITAL_COMMUNITY)
Admission: RE | Admit: 2023-02-22 | Discharge: 2023-02-22 | Disposition: A | Discharge status: Home / Self Care | Payer: BC Managed Care – PPO | Source: Ambulatory Visit | Attending: Cardiovascular Disease | Admitting: Cardiovascular Disease

## 2023-02-22 ENCOUNTER — Encounter (HOSPITAL_COMMUNITY): Payer: BC Managed Care – PPO

## 2023-02-22 DIAGNOSIS — Z955 Presence of coronary angioplasty implant and graft: Secondary | ICD-10-CM | POA: Diagnosis not present

## 2023-02-22 DIAGNOSIS — I2111 ST elevation (STEMI) myocardial infarction involving right coronary artery: Secondary | ICD-10-CM | POA: Diagnosis not present

## 2023-02-25 ENCOUNTER — Encounter (HOSPITAL_COMMUNITY): Payer: BC Managed Care – PPO

## 2023-02-27 ENCOUNTER — Encounter (HOSPITAL_COMMUNITY): Payer: BC Managed Care – PPO

## 2023-02-28 ENCOUNTER — Encounter: Payer: Self-pay | Admitting: Family Medicine

## 2023-03-01 ENCOUNTER — Encounter (HOSPITAL_COMMUNITY): Payer: BC Managed Care – PPO

## 2023-03-01 ENCOUNTER — Telehealth (HOSPITAL_COMMUNITY): Payer: Self-pay | Admitting: *Deleted

## 2023-03-04 ENCOUNTER — Encounter (HOSPITAL_COMMUNITY): Payer: BC Managed Care – PPO

## 2023-03-04 ENCOUNTER — Encounter (HOSPITAL_COMMUNITY)
Admission: RE | Admit: 2023-03-04 | Discharge: 2023-03-04 | Disposition: A | Discharge status: Home / Self Care | Payer: BC Managed Care – PPO | Source: Ambulatory Visit | Attending: Cardiovascular Disease | Admitting: Cardiovascular Disease

## 2023-03-04 DIAGNOSIS — I2111 ST elevation (STEMI) myocardial infarction involving right coronary artery: Secondary | ICD-10-CM | POA: Diagnosis not present

## 2023-03-04 DIAGNOSIS — Z955 Presence of coronary angioplasty implant and graft: Secondary | ICD-10-CM | POA: Diagnosis not present

## 2023-03-06 ENCOUNTER — Encounter (HOSPITAL_COMMUNITY): Payer: BC Managed Care – PPO

## 2023-03-06 ENCOUNTER — Encounter (HOSPITAL_COMMUNITY)
Admission: RE | Admit: 2023-03-06 | Discharge: 2023-03-06 | Disposition: A | Discharge status: Home / Self Care | Payer: BC Managed Care – PPO | Source: Ambulatory Visit | Attending: Cardiovascular Disease | Admitting: Cardiovascular Disease

## 2023-03-06 DIAGNOSIS — Z955 Presence of coronary angioplasty implant and graft: Secondary | ICD-10-CM

## 2023-03-06 DIAGNOSIS — I2111 ST elevation (STEMI) myocardial infarction involving right coronary artery: Secondary | ICD-10-CM | POA: Diagnosis not present

## 2023-03-06 LAB — GLUCOSE, CAPILLARY: Glucose-Capillary: 172 mg/dL — ABNORMAL HIGH (ref 70–99)

## 2023-03-08 ENCOUNTER — Telehealth (HOSPITAL_COMMUNITY): Payer: Self-pay

## 2023-03-08 ENCOUNTER — Encounter (HOSPITAL_COMMUNITY)
Admission: RE | Admit: 2023-03-08 | Discharge: 2023-03-08 | Disposition: A | Discharge status: Home / Self Care | Payer: BC Managed Care – PPO | Source: Ambulatory Visit | Attending: Cardiovascular Disease | Admitting: Cardiovascular Disease

## 2023-03-08 ENCOUNTER — Encounter (HOSPITAL_COMMUNITY): Payer: BC Managed Care – PPO

## 2023-03-08 DIAGNOSIS — Z955 Presence of coronary angioplasty implant and graft: Secondary | ICD-10-CM

## 2023-03-08 DIAGNOSIS — I2111 ST elevation (STEMI) myocardial infarction involving right coronary artery: Secondary | ICD-10-CM

## 2023-03-08 NOTE — Telephone Encounter (Signed)
-----   Message from Tonny Bollman sent at 03/08/2023 11:01 AM EDT ----- Regarding: RE: [employer] and Jog request Fine to increase per your protocol. Thank you ----- Message ----- From: Harrie Jeans Sent: 03/08/2023   8:37 AM EDT To: Tonny Bollman, MD Subject: [name] and Jog request                       CARDIAC REHAB PHASE 2  Good morning,   A mutual patient Teresa Parks has been in the cardiac rehabilitation program approximately 8 weeks and is doing well. Patient would like to begin jogging in intervals. Her current MET level is 4.37. If agreeable to change, her blood pressure is within normal limits, but exercise heart rates will beginning to exceed Target Heart Rate Range(THRR) of 67-134 bpm (40%-80% of age predicted max HR).  If no GXT is planned for the near future and MD agrees, request to increase THR to 40% -90% of age predicted max HR 67-150 bpm.   Thanks for your assistance,  Harrie Jeans ACSM-CEP 03/08/2023 8:31 AM

## 2023-03-08 NOTE — Telephone Encounter (Signed)
-----   Message from Tonny Bollman sent at 03/08/2023 11:01 AM EDT ----- Regarding: RE: [employer] and Jog request Fine to increase per your protocol. Thank you ----- Message ----- From: Harrie Jeans Sent: 03/08/2023   8:37 AM EDT To: Tonny Bollman, MD Subject: [name] and Jog request                       CARDIAC REHAB PHASE 2  Good morning,   A mutual patient KALYANA FLUHR has been in the cardiac rehabilitation program approximately 8 weeks and is doing well. Patient would like to begin jogging in intervals. Her current MET level is 4.37. If agreeable to change, her blood pressure is within normal limits, but exercise heart rates will beginning to exceed Target Heart Rate Range(THRR) of 67-134 bpm (40%-80% of age predicted max HR).  If no GXT is planned for the near future and MD agrees, request to increase THR to 40% -90% of age predicted max HR 67-150 bpm.   Thanks for your assistance,  Harrie Jeans ACSM-CEP 03/08/2023 8:31 AM

## 2023-03-11 ENCOUNTER — Encounter (HOSPITAL_COMMUNITY)
Admission: RE | Admit: 2023-03-11 | Discharge: 2023-03-11 | Disposition: A | Discharge status: Home / Self Care | Payer: BC Managed Care – PPO | Source: Ambulatory Visit | Attending: Cardiovascular Disease | Admitting: Cardiovascular Disease

## 2023-03-11 ENCOUNTER — Encounter (HOSPITAL_COMMUNITY): Payer: BC Managed Care – PPO

## 2023-03-11 DIAGNOSIS — I2111 ST elevation (STEMI) myocardial infarction involving right coronary artery: Secondary | ICD-10-CM | POA: Diagnosis not present

## 2023-03-11 DIAGNOSIS — Z955 Presence of coronary angioplasty implant and graft: Secondary | ICD-10-CM

## 2023-03-12 NOTE — Progress Notes (Signed)
Cardiac Individual Treatment Plan  Patient Details  Name: Teresa Parks MRN: [id number] Date of Birth: May 01, 1971 Referring Provider:   Flowsheet Row INTENSIVE CARDIAC REHAB ORIENT from 01/10/2023 in [facility]  Referring Provider Dr. Tonny Bollman MD       Initial Encounter Date:  Flowsheet Row INTENSIVE CARDIAC REHAB ORIENT from 01/10/2023 in [facility]  Date 01/10/23       Visit Diagnosis: 12/21/22 ST elevation myocardial infarction involving right coronary artery (HCC)  12/21/22 S/P DES Mid RCA  Patient's Home Medications on Admission:  Current Outpatient Medications:    acetaminophen (TYLENOL) 325 MG tablet, Take 325-650 mg by mouth as needed for headache., Disp: , Rfl:    aspirin 81 MG chewable tablet, Chew 1 tablet (81 mg total) by mouth daily., Disp: , Rfl:    cetirizine (ZYRTEC ALLERGY) 10 MG tablet, Take 1 tablet (10 mg total) by mouth daily., Disp: 90 tablet, Rfl: 1   Continuous Glucose Sensor (FREESTYLE LIBRE 3 SENSOR) MISC, USE AS DIRECTED SUBCUTANEOUS EVERY 15 DAYS for 30 days, Disp: , Rfl:    cyclobenzaprine (FLEXERIL) 10 MG tablet, Take 10 mg by mouth daily as needed for muscle spasms., Disp: , Rfl:    fenofibrate 160 MG tablet, Take 1 tablet (160 mg total) by mouth daily. (Patient taking differently: Take 160 mg by mouth daily in the afternoon.), Disp: 90 tablet, Rfl: 1   fluticasone (FLONASE) 50 MCG/ACT nasal spray, Place 2 sprays into both nostrils daily., Disp: 16 g, Rfl: 6   glimepiride (AMARYL) 2 MG tablet, Take 2 mg by mouth every morning., Disp: , Rfl:    Glucagon (BAQSIMI ONE PACK) 3 MG/DOSE POWD, Place 1 Dose into the nose as needed (low blood sugar)., Disp: , Rfl:    glucose blood test strip, Use as instructed, Disp: 100 each, Rfl: 12   Icosapent Ethyl (VASCEPA) 0.5 g CAPS, Take 3 capsules (1.5 g total) by mouth daily., Disp: , Rfl:     insulin aspart (NOVOLOG) 100 UNIT/ML injection, Inject 10-30 Units into the skin 3 (three) times daily before meals. Take sliding scale as directed, Disp: , Rfl:    insulin degludec (TRESIBA FLEXTOUCH) 200 UNIT/ML FlexTouch Pen, Take 25 units in the morning and 52 units at night (Patient taking differently: 20 Units in the morning and at bedtime. Take 20 units in the morning and 20 units at night), Disp: 30 mL, Rfl: 3   Lancets (FREESTYLE) lancets, Use as instructed, Disp: 100 each, Rfl: 12   metoprolol succinate (TOPROL-XL) 25 MG 24 hr tablet, Take 1 tablet (25 mg total) by mouth daily., Disp: 90 tablet, Rfl: 3   MOUNJARO 2.5 MG/0.5ML Pen, Inject 2.5 mg into the skin once a week., Disp: , Rfl:    nitroGLYCERIN (NITROSTAT) 0.4 MG SL tablet, Place 1 tablet (0.4 mg total) under the tongue every 5 (five) minutes as needed for chest pain., Disp: 25 tablet, Rfl: 6   olmesartan (BENICAR) 40 MG tablet, Take 1 tablet (40 mg total) by mouth daily., Disp: 90 tablet, Rfl: 3   rosuvastatin (CRESTOR) 10 MG tablet, Take 1 tablet (10 mg total) by mouth daily., Disp: 90 tablet, Rfl: 3   ticagrelor (BRILINTA) 90 MG TABS tablet, Take 1 tablet (90 mg total) by mouth 2 (two) times daily., Disp: 180 tablet, Rfl: 3  Past Medical History: Past Medical History:  Diagnosis Date   Acute ST  elevation myocardial infarction (STEMI) of inferior wall (HCC) 12/21/2022   Arthritis    Bell's palsy    hx of 15 years ago approx   CAD in native artery with cardiac cath and DES to [facility] 12/21/22 12/22/2022   Carpal tunnel syndrome    Chronic ear infection    Chronic pancreatitis (HCC)    Diabetes (HCC)    Diabetes mellitus type II, controlled (HCC)    Hemorrhoids    HTN (hypertension)    pt reports b/p runs high when goes to MD office   Hyperlipidemia    Obesity    Pancreatitis, acute     Tobacco Use: Social History   Tobacco Use  Smoking Status Every Day   Current packs/day: 0.50   Average packs/day: 0.5 packs/day for  36.7 years (18.3 ttl pk-yrs)   Types: Cigarettes   Start date: 1988  Smokeless Tobacco Never  Tobacco Comments   01/10/23 Referral faxed to [facility] QUITLINE    Labs: Review Flowsheet  More data exists      Latest Ref Rng & Units 12/07/2021 01/15/2022 04/10/2022 12/22/2022 02/15/2023  Labs for ITP Cardiac and Pulmonary Rehab  Cholestrol 100 - 199 mg/dL - - - 742  595   LDL (calc) 0 - 99 mg/dL - - - UNABLE TO CALCULATE IF TRIGLYCERIDE OVER 400 mg/dL  43   Direct LDL 0 - 99 mg/dL - - - 45  -  HDL-C >63 mg/dL - - - 23  24   Trlycerides 0 - 149 mg/dL 8756.4 Triglyceride is over 400; calculations on Lipids are invalid.  - - 569  286   Hemoglobin A1c 4.8 - 5.6 % - 12.7  7.7  10.8  -    Details            Capillary Blood Glucose: Lab Results  Component Value Date   GLUCAP 172 (H) 03/06/2023   GLUCAP 114 (H) 01/23/2023   GLUCAP 137 (H) 01/16/2023   GLUCAP 86 01/14/2023   GLUCAP 67 (L) 01/14/2023     Exercise Target Goals: Exercise Program Goal: Individual exercise prescription set using results from initial 6 min walk test and THRR while considering  patient's activity barriers and safety.   Exercise Prescription Goal: Initial exercise prescription builds to 30-45 minutes a day of aerobic activity, 2-3 days per week.  Home exercise guidelines will be given to patient during program as part of exercise prescription that the participant will acknowledge.  Activity Barriers & Risk Stratification:  Activity Barriers & Cardiac Risk Stratification - 01/10/23 1500       Activity Barriers & Cardiac Risk Stratification   Activity Barriers Deconditioning;Back Problems    Cardiac Risk Stratification High             6 Minute Walk:  6 Minute Walk     Row Name 01/10/23 1455         6 Minute Walk   Phase Initial  CBG Pre 270 via personal CGM     Distance 1370 feet     Walk Time 6 minutes     # of Rest Breaks 0     MPH 2.6     METS 3.69     RPE 7     Perceived Dyspnea  0      VO2 Peak 12.9     Symptoms No     Resting HR 77 bpm     Resting BP 134/66     Resting Oxygen Saturation  98 %  Exercise Oxygen Saturation  during 6 min walk 99 %     Max Ex. HR 107 bpm     Max Ex. BP 144/72     2 Minute Post BP 138/68              Oxygen Initial Assessment:   Oxygen Re-Evaluation:   Oxygen Discharge (Final Oxygen Re-Evaluation):   Initial Exercise Prescription:  Initial Exercise Prescription - 01/10/23 1400       Date of Initial Exercise RX and Referring Provider   Date 01/10/23    Referring Provider Dr. Tonny Bollman MD    Expected Discharge Date 03/22/23      Treadmill   MPH 2.3    Grade 0    Minutes 15    METs 2.76      Elliptical   Level 1    Speed 1    Minutes 15    METs 3.2      Prescription Details   Frequency (times per week) 3    Duration Progress to 30 minutes of continuous aerobic without signs/symptoms of physical distress      Intensity   THRR 40-80% of Max Heartrate 67-134    Ratings of Perceived Exertion 11-13    Perceived Dyspnea 0-4      Progression   Progression Continue progressive overload as per policy without signs/symptoms or physical distress.      Resistance Training   Training Prescription Yes    Weight 3    Reps 10-15             Perform Capillary Blood Glucose checks as needed.  Exercise Prescription Changes:   Exercise Prescription Changes     Row Name 01/16/23 1628 01/30/23 1642 02/13/23 1701 03/04/23 1648       Response to Exercise   Blood Pressure (Admit) 138/78 124/62 110/58 126/70    Blood Pressure (Exercise) 220/90 148/80 152/72 148/70    Blood Pressure (Exit) 124/68 104/60 98/60 102/66    Heart Rate (Admit) 94 bpm 90 bpm 94 bpm 86 bpm    Heart Rate (Exercise) 127 bpm 114 bpm 117 bpm 132 bpm    Heart Rate (Exit) 103 bpm 97 bpm 103 bpm 92 bpm    Rating of Perceived Exertion (Exercise) 7.5 7.5 9 11     Perceived Dyspnea (Exercise) 0 0 0 0    Symptoms High BP with elliptical,  d/c and resolved with rest. Recheck 128/78 -- -- --    Comments Pt first day on exercise in teh CRP2 program Reviewed MET's, goals and home ExRx REVD MET REVD MET's and goals    Duration Progress to 30 minutes of  aerobic without signs/symptoms of physical distress Progress to 30 minutes of  aerobic without signs/symptoms of physical distress Progress to 30 minutes of  aerobic without signs/symptoms of physical distress Progress to 30 minutes of  aerobic without signs/symptoms of physical distress    Intensity THRR unchanged THRR unchanged THRR unchanged THRR unchanged      Progression   Progression Continue to progress workloads to maintain intensity without signs/symptoms of physical distress. Continue to progress workloads to maintain intensity without signs/symptoms of physical distress. Continue to progress workloads to maintain intensity without signs/symptoms of physical distress. Continue to progress workloads to maintain intensity without signs/symptoms of physical distress.    Average METs 2.91 2.9 3.55 4.37      Resistance Training   Training Prescription No No No Yes    Weight -- -- -- 3  lbs    Reps -- -- -- 10-15    Time -- -- -- 10 Minutes      Treadmill   MPH 2.5 -- -- 2.7  Patient stated her knee's were bothjering her, TM was easier for her    Grade 0 0 -- 3.5    Minutes 15 15 -- 30    METs 2.91 2.91 -- 4.37      NuStep   Level -- 4 4 --    SPM -- 112 110 --    Minutes -- 15 15 --    METs -- 2.9 3.1 --      Recumbant Elliptical   Level -- -- 3 --    RPM -- -- 69 --    Watts -- -- 102 --    Minutes -- -- 15 --    METs -- -- 4 --      Elliptical   Level 1 -- -- --    Speed 1 -- -- --    Minutes 7  D/C after 7 mins due to high BP 220/90, resolved with rest. Recheck 128/78 -- -- --      Home Exercise Plan   Plans to continue exercise at -- Home (comment) Home (comment) Home (comment)    Frequency -- Add 2 additional days to program exercise sessions. Add 2  additional days to program exercise sessions. Add 2 additional days to program exercise sessions.    Initial Home Exercises Provided -- 01/30/23 01/30/23 01/30/23             Exercise Comments:   Exercise Comments     Row Name 01/14/23 1627 01/16/23 1640 01/30/23 1648 02/13/23 1704 03/04/23 1653   Exercise Comments Pt first day in teh CRP2 program. Pt was unable to exercise today due to low blood sugar, but was able to chat with the RD. Will update exercise changes on patients next visit. Pt first day of exercise in the [facility] ICR program. Pt developed high BP on exercise equipment 220/90 on elliptical, resolved with rest to 128/78 and the treadmill 162/80, resolved with rest to 124/72. Pt was asymptomatic and very motivated to exercise, education given on high BP risk and will assess new equipment mode for next session until BP becomes more managed. REVD MET's, goals and home Exercise. Pt tolerated exercise well with an average MET level of 2.9. Pt is already exercising 2 days for 30-45 mins per session by playing games with her grandson and using her infinity hoop. Pt feeels good about her goals so far and is gaining stamina. She is also meeting regular with the RD who is helping with diet resources REVD MET's. Pt tolerated exercise well with an average MET level of 3.55. Pt is increasing her METs and has added in a second mode of exercise with the Octane. She is doing well with this change and feels good about her progress so far REVD MET's and goals. Pt tolerated exercise well with an average MET level of 4.37. Pt feels good about her goals and is working closely with the RD, she is also increasing strength and stamina            Exercise Goals and Review:   Exercise Goals     Row Name 01/10/23 1501             Exercise Goals   Increase Physical Activity Yes       Intervention Provide advice, education, support and counseling about physical activity/exercise  needs.;Develop an  individualized exercise prescription for aerobic and resistive training based on initial evaluation findings, risk stratification, comorbidities and participant's personal goals.       Expected Outcomes Short Term: Attend rehab on a regular basis to increase amount of physical activity.;Long Term: Exercising regularly at least 3-5 days a week.;Long Term: Add in home exercise to make exercise part of routine and to increase amount of physical activity.       Increase Strength and Stamina Yes       Intervention Provide advice, education, support and counseling about physical activity/exercise needs.;Develop an individualized exercise prescription for aerobic and resistive training based on initial evaluation findings, risk stratification, comorbidities and participant's personal goals.       Expected Outcomes Short Term: Increase workloads from initial exercise prescription for resistance, speed, and METs.;Short Term: Perform resistance training exercises routinely during rehab and add in resistance training at home;Long Term: Improve cardiorespiratory fitness, muscular endurance and strength as measured by increased METs and functional capacity ( )       Able to understand and use rate of perceived exertion (RPE) scale Yes       Intervention Provide education and explanation on how to use RPE scale       Expected Outcomes Short Term: Able to use RPE daily in rehab to express subjective intensity level;Long Term:  Able to use RPE to guide intensity level when exercising independently       Knowledge and understanding of Target Heart Rate Range (THRR) Yes       Intervention Provide education and explanation of THRR including how the numbers were predicted and where they are located for reference       Expected Outcomes Short Term: Able to state/look up THRR;Long Term: Able to use THRR to govern intensity when exercising independently;Short Term: Able to use daily as guideline for intensity in rehab        Understanding of Exercise Prescription Yes       Intervention Provide education, explanation, and written materials on patient's individual exercise prescription       Expected Outcomes Short Term: Able to explain program exercise prescription;Long Term: Able to explain home exercise prescription to exercise independently                Exercise Goals Re-Evaluation :  Exercise Goals Re-Evaluation     Row Name 01/16/23 1633 01/30/23 1645 03/04/23 1651         Exercise Goal Re-Evaluation   Exercise Goals Review Increase Physical Activity;Understanding of Exercise Prescription;Increase Strength and Stamina;Knowledge and understanding of Target Heart Rate Range (THRR);Able to understand and use rate of perceived exertion (RPE) scale Increase Physical Activity;Understanding of Exercise Prescription;Increase Strength and Stamina;Knowledge and understanding of Target Heart Rate Range (THRR);Able to understand and use rate of perceived exertion (RPE) scale Increase Physical Activity;Understanding of Exercise Prescription;Increase Strength and Stamina;Knowledge and understanding of Target Heart Rate Range (THRR);Able to understand and use rate of perceived exertion (RPE) scale     Comments Pt first day of exercise in the [facility] ICR program. Pt developed high BP on exercise equipment 220/90 on elliptical, resolved with rest to 128/78 and the treadmill 162/80, resolved with rest to 124/72. Pt was asymptomatic and very motivated to exercise, education given on high BP risk and will assess new equipment mode for next session until BP becomes more managed. REVD MET's, goals and home Exercise. Pt tolerated exercise well with an average MET level of 2.9. Pt is already exercising 2  days for 30-45 mins per session by playing games with her grandson and using her infinity hoop. Pt feeels good about her goals so far and is gaining stamina. She is also meeting regular with the RD who is helping with diet resources  REVD MET's and goals. Pt tolerated exercise well with an average MET level of 4.37. Pt feels good about her goals and is working closely with the RD, she is also increasing strength and stamina     Expected Outcomes Will continue to monitor pt and assess new modes and intensities for exercise Will continue to monitor pt and assess new modes and intensities for exercise Will continue to monitor pt and assess new modes and intensities for exercise              Discharge Exercise Prescription (Final Exercise Prescription Changes):  Exercise Prescription Changes - 03/04/23 1648       Response to Exercise   Blood Pressure (Admit) 126/70    Blood Pressure (Exercise) 148/70    Blood Pressure (Exit) 102/66    Heart Rate (Admit) 86 bpm    Heart Rate (Exercise) 132 bpm    Heart Rate (Exit) 92 bpm    Rating of Perceived Exertion (Exercise) 11    Perceived Dyspnea (Exercise) 0    Comments REVD MET's and goals    Duration Progress to 30 minutes of  aerobic without signs/symptoms of physical distress    Intensity THRR unchanged      Progression   Progression Continue to progress workloads to maintain intensity without signs/symptoms of physical distress.    Average METs 4.37      Resistance Training   Training Prescription Yes    Weight 3 lbs    Reps 10-15    Time 10 Minutes      Treadmill   MPH 2.7   Patient stated her knee's were bothjering her, TM was easier for her   Grade 3.5    Minutes 30    METs 4.37      Home Exercise Plan   Plans to continue exercise at Home (comment)    Frequency Add 2 additional days to program exercise sessions.    Initial Home Exercises Provided 01/30/23             Nutrition:  Target Goals: Understanding of nutrition guidelines, daily intake of sodium 1500mg , cholesterol 200mg , calories 30% from fat and 7% or less from saturated fats, daily to have 5 or more servings of fruits and vegetables.  Biometrics:  Pre Biometrics - 01/10/23 1453        Pre Biometrics   Waist Circumference 45 inches    Hip Circumference 47 inches    Waist to Hip Ratio 0.96 %    Triceps Skinfold 9 mm    % Body Fat 39.1 %    Grip Strength 38 kg    Flexibility 12.75 in    Single Leg Stand 24.7 seconds              Nutrition Therapy Plan and Nutrition Goals:  Nutrition Therapy & Goals - 02/15/23 1424       Nutrition Therapy   Diet Heart Healthy/Carbohydrate Consistent Diet    Drug/Food Interactions Statins/Certain Fruits      Personal Nutrition Goals   Nutrition Goal Patient to identify strategies for reducing cardiovascular risk by attending the [facility] education and nutrition series weekly.    Personal Goal #2 Patient to improve diet quality by using the plate method as  a guide for meal planning to include lean protein/plant protein, fruits, vegetables, whole grains, nonfat dairy as part of a well-balanced diet.    Personal Goal #3 Patient to improve blood sugar control with goal A1c <7%.    Personal Goal #4 Patient to identify food sources and limit intake of saturated fat, trans fat, sodium, and refined carbohydrates.    Comments Goals in progress. Myleah continues to attend the [employer] and nutrition series regularly. She has started making many dietary changes and continues to work on transitioning to whole grains, increasing fruits/vegetables, reducing saturated fat. She continues to work on reducing [employer] and sweet tea intake. She has reduced carbohydrate intake at meals. She continues Mounjaro to aid with weight loss and blood sugar control.Her Evaristo Bury  has been reduced to 15 units in the morning and novolog has been reduced and she stopped the glimepiride.  She also has history of hypertriglyceridemia and pancreatitis; she continues vascepa and fenofibrate. She is down 3.7# since starting with our program. Patient will benefit from participation in intensive cardiac rehab for nutrition, exercise, and lifestyle  modification.      Intervention Plan   Intervention Prescribe, educate and counsel regarding individualized specific dietary modifications aiming towards targeted core components such as weight, hypertension, lipid management, diabetes, heart failure and other comorbidities.;Nutrition handout(s) given to patient.    Expected Outcomes Short Term Goal: Understand basic principles of dietary content, such as calories, fat, sodium, cholesterol and nutrients.;Long Term Goal: Adherence to prescribed nutrition plan.             Nutrition Assessments:  Nutrition Assessments - 03/07/23 1453       Rate Your Plate Scores   Pre Score 47            MEDIFICTS Score Key: ?70 Need to make dietary changes  40-70 Heart Healthy Diet ? 40 Therapeutic Level Cholesterol Diet   Flowsheet Row INTENSIVE CARDIAC REHAB from 03/06/2023 in [facility]  Picture Your Plate Total Score on Admission 47      Picture Your Plate Scores: <40 Unhealthy dietary pattern with much room for improvement. 41-50 Dietary pattern unlikely to meet recommendations for good health and room for improvement. 51-60 More healthful dietary pattern, with some room for improvement.  >60 Healthy dietary pattern, although there may be some specific behaviors that could be improved.    Nutrition Goals Re-Evaluation:  Nutrition Goals Re-Evaluation     Row Name 01/15/23 1439 02/15/23 1424           Goals   Current Weight 212 lb 11.9 oz (96.5 kg) 208 lb 15.9 oz (94.8 kg)      Comment A1c 10.8,  LDL WNL, triglycerides 569 (taking fenofibrate, Vascepa), HDL 23, lipoproteinA WNL no new labs; most recent labs A1c 10.8, LDL WNL, triglycerides 569 (taking fenofibrate, Vascepa), HDL 23, lipoproteinA WNL      Expected Outcome Desere has started to make some dietary changes including reduced sodium intake and reduced reguar [facility] to 3 daily. She did start Mounjaro to aid  with weight loss and blood sugar control two weeks ago. She continues sliding scale Novolog and Tresiba 54 units twice daily. She also has history of hypertriglyceridemia and pancreatitis. Patient will benefit from participation in intensive cardiac rehab for nutrition, exercise, and lifestyle modification. Goals in progress. Daisee continues to attend the [employer] and nutrition series regularly. She has started making many dietary changes and  continues to work on transitioning to whole grains, increasing fruits/vegetables, reducing saturated fat. She continues to work on reducing [employer] and sweet tea intake. She has reduced carbohydrate intake at meals. She continues Mounjaro to aid with weight loss and blood sugar control.Her Evaristo Bury has been reduced to 15 units in the morning and novolog has been reduced and she stopped the glimepiride. She also has history of hypertriglyceridemia and pancreatitis; she continues vascepa and fenofibrate. She is down 3.7# since starting with our program. Patient will benefit from participation in intensive cardiac rehab for nutrition, exercise, and lifestyle modification.               Nutrition Goals Re-Evaluation:  Nutrition Goals Re-Evaluation     Row Name 01/15/23 1439 02/15/23 1424           Goals   Current Weight 212 lb 11.9 oz (96.5 kg) 208 lb 15.9 oz (94.8 kg)      Comment A1c 10.8,  LDL WNL, triglycerides 569 (taking fenofibrate, Vascepa), HDL 23, lipoproteinA WNL no new labs; most recent labs A1c 10.8, LDL WNL, triglycerides 569 (taking fenofibrate, Vascepa), HDL 23, lipoproteinA WNL      Expected Outcome Fredi has started to make some dietary changes including reduced sodium intake and reduced reguar [facility] to 3 daily. She did start Mounjaro to aid with weight loss and blood sugar control two weeks ago. She continues sliding scale Novolog and Tresiba 54 units twice daily. She also has history of hypertriglyceridemia and  pancreatitis. Patient will benefit from participation in intensive cardiac rehab for nutrition, exercise, and lifestyle modification. Goals in progress. Niajah continues to attend the [employer] and nutrition series regularly. She has started making many dietary changes and continues to work on transitioning to whole grains, increasing fruits/vegetables, reducing saturated fat. She continues to work on reducing [employer] and sweet tea intake. She has reduced carbohydrate intake at meals. She continues Mounjaro to aid with weight loss and blood sugar control.Her Evaristo Bury has been reduced to 15 units in the morning and novolog has been reduced and she stopped the glimepiride. She also has history of hypertriglyceridemia and pancreatitis; she continues vascepa and fenofibrate. She is down 3.7# since starting with our program. Patient will benefit from participation in intensive cardiac rehab for nutrition, exercise, and lifestyle modification.               Nutrition Goals Discharge (Final Nutrition Goals Re-Evaluation):  Nutrition Goals Re-Evaluation - 02/15/23 1424       Goals   Current Weight 208 lb 15.9 oz (94.8 kg)    Comment no new labs; most recent labs A1c 10.8, LDL WNL, triglycerides 569 (taking fenofibrate, Vascepa), HDL 23, lipoproteinA WNL    Expected Outcome Goals in progress. Pauleen continues to attend the [employer] and nutrition series regularly. She has started making many dietary changes and continues to work on transitioning to whole grains, increasing fruits/vegetables, reducing saturated fat. She continues to work on reducing [employer] and sweet tea intake. She has reduced carbohydrate intake at meals. She continues Mounjaro to aid with weight loss and blood sugar control.Her Evaristo Bury has been reduced to 15 units in the morning and novolog has been reduced and she stopped the glimepiride. She also has history of hypertriglyceridemia and pancreatitis; she continues  vascepa and fenofibrate. She is down 3.7# since starting with our program. Patient will benefit from participation in intensive cardiac rehab for nutrition, exercise, and lifestyle modification.  Psychosocial: Target Goals: Acknowledge presence or absence of significant depression and/or stress, maximize coping skills, provide positive support system. Participant is able to verbalize types and ability to use techniques and skills needed for reducing stress and depression.  Initial Review & Psychosocial Screening:  Initial Psych Review & Screening - 01/10/23 1530       Initial Review   Current issues with Current Stress Concerns    Source of Stress Concerns Chronic Illness;Occupation    Comments Rosalea reports having stress due to her recent hospitalizaiton due to MI/ STENT      Family Dynamics   Good Support System? Yes   Hanifah has her husband and daughter and coworkers for support     Barriers   Psychosocial barriers to participate in program The patient should benefit from training in stress management and relaxation.      Screening Interventions   Interventions Encouraged to exercise;To provide support and resources with identified psychosocial needs;Provide feedback about the scores to participant    Expected Outcomes Long Term Goal: Stressors or current issues are controlled or eliminated.;Long Term goal: The participant improves quality of Life and PHQ9 Scores as seen by post scores and/or verbalization of changes             Quality of Life Scores:  Quality of Life - 01/10/23 1503       Quality of Life   Select Quality of Life      Quality of Life Scores   Health/Function Pre 23.67 %    Socioeconomic Pre 22.31 %    Psych/Spiritual Pre 20.71 %    Family Pre 23.4 %    GLOBAL Pre 22.73 %            Scores of 19 and below usually indicate a poorer quality of life in these areas.  A difference of  2-3 points is a clinically meaningful difference.  A  difference of 2-3 points in the total score of the Quality of Life Index has been associated with significant improvement in overall quality of life, self-image, physical symptoms, and general health in studies assessing change in quality of life.  PHQ-9: Review Flowsheet  More data exists      01/22/2023 01/10/2023 08/17/2021 04/14/2020 08/22/2018  Depression screen PHQ 2/9  Decreased Interest 0 0 0 0 0  Down, Depressed, Hopeless 0 0 0 0 0  PHQ - 2 Score 0 0 0 0 0  Altered sleeping 1 1 - - 0  Tired, decreased energy 1 1 - - 0  Change in appetite 1 1 - - 0  Feeling bad or failure about yourself  0 - - - 0  Trouble concentrating 0 0 - - 0  Moving slowly or fidgety/restless 0 0 - - 0  Suicidal thoughts 0 0 - - 0  PHQ-9 Score 3 3 - - 0  Difficult doing work/chores Not difficult at all Not difficult at all - - -    Details           Interpretation of Total Score  Total Score Depression Severity:  1-4 = Minimal depression, 5-9 = Mild depression, 10-14 = Moderate depression, 15-19 = Moderately severe depression, 20-27 = Severe depression   Psychosocial Evaluation and Intervention:   Psychosocial Re-Evaluation:  Psychosocial Re-Evaluation     Row Name 02/13/23 1343 03/12/23 1345           Psychosocial Re-Evaluation   Current issues with Current Stress Concerns None Identified  Comments Cynithia continues to have concerns regarding her health related to diabetes. Dorian recently had an outpatient procedure Kricket has not voiced any increased concerns or stressors during exercise at cardiac rehab      Expected Outcomes Marimar will have decreased or controlled  stress upon completion of cardiac rehab Mavi says cardiac rehab has been helpful. Gaylin will complete cardiac rehab on 03/25/23      Interventions Stress management education;Encouraged to attend Cardiac Rehabilitation for the exercise;Relaxation education Stress management education;Encouraged to attend Cardiac Rehabilitation for  the exercise;Relaxation education      Continue Psychosocial Services  Follow up required by staff No Follow up required        Initial Review   Source of Stress Concerns Chronic Illness Chronic Illness      Comments will continue to monitor and offer support as needed will continue to monitor and offer support as needed               Psychosocial Discharge (Final Psychosocial Re-Evaluation):  Psychosocial Re-Evaluation - 03/12/23 1345       Psychosocial Re-Evaluation   Current issues with None Identified    Comments Shatima has not voiced any increased concerns or stressors during exercise at cardiac rehab    Expected Outcomes Lekeesha says cardiac rehab has been helpful. Tialisa will complete cardiac rehab on 03/25/23    Interventions Stress management education;Encouraged to attend Cardiac Rehabilitation for the exercise;Relaxation education    Continue Psychosocial Services  No Follow up required      Initial Review   Source of Stress Concerns Chronic Illness    Comments will continue to monitor and offer support as needed             Vocational Rehabilitation: Provide vocational rehab assistance to qualifying candidates.   Vocational Rehab Evaluation & Intervention:  Vocational Rehab - 01/10/23 1532       Initial Vocational Rehab Evaluation & Intervention   Assessment shows need for Vocational Rehabilitation No   Sherron works full time remotely and does not need vocational rehab at this time            Education: Education Goals: Education classes will be provided on a weekly basis, covering required topics. Participant will state understanding/return demonstration of topics presented.    Education     Row Name 01/14/23 1500     Education   Cardiac Education Topics Pritikin   Licensed conveyancer Nutrition   Nutrition Calorie Density   Instruction Review Code 1- Verbalizes Understanding   Class Start Time 1400    Class Stop Time 1445   Class Time Calculation (min) 45 min    Row Name 01/16/23 1600     Education   Cardiac Education Topics Pritikin   Customer service manager   Weekly Topic Efficiency Cooking - Meals in a Snap   Instruction Review Code 1- Verbalizes Understanding   Class Start Time 1400   Class Stop Time 1445   Class Time Calculation (min) 45 min    Row Name 01/23/23 1600     Education   Cardiac Education Topics Pritikin   Customer service manager   Weekly Topic One-Pot Wonders   Instruction Review Code 1- Verbalizes Understanding   Class Start Time 1400   Class Stop Time 1440  Class Time Calculation (min) 40 min    Row Name 01/25/23 1500     Education   Cardiac Education Topics Pritikin   Hospital doctor Education   General Education Hypertension and Heart Disease   Instruction Review Code 1- Verbalizes Understanding   Class Start Time 1400   Class Stop Time 1450   Class Time Calculation (min) 50 min    Row Name 01/30/23 1600     Education   Cardiac Education Topics Pritikin   Customer service manager   Weekly Topic Comforting Weekend Breakfasts   Instruction Review Code 1- Verbalizes Understanding   Class Start Time 1400   Class Stop Time 1445   Class Time Calculation (min) 45 min    Row Name 02/06/23 1500     Education   Cardiac Education Topics Pritikin   Customer service manager   Weekly Topic Fast Evening Meals   Instruction Review Code 1- Verbalizes Understanding   Class Start Time 1400   Class Stop Time 1440   Class Time Calculation (min) 40 min    Row Name 02/08/23 1500     Education   Cardiac Education Topics Pritikin   Licensed conveyancer Nutrition    Nutrition Vitamins and Minerals   Instruction Review Code 1- Verbalizes Understanding   Class Start Time 1400   Class Stop Time 1445   Class Time Calculation (min) 45 min    Row Name 02/11/23 1600     Education   Cardiac Education Topics Pritikin   Geographical information systems officer Psychosocial   Psychosocial Workshop Healthy Sleep for a Healthy Heart   Instruction Review Code 1- Verbalizes Understanding   Class Start Time 1400   Class Stop Time 1455   Class Time Calculation (min) 55 min    Row Name 02/13/23 1600     Education   Cardiac Education Topics Pritikin   Customer service manager   Weekly Topic International Cuisine- Spotlight on the [employer] Zones   Instruction Review Code 1- Verbalizes Understanding   Class Start Time 1400   Class Stop Time 1448   Class Time Calculation (min) 48 min    Row Name 02/15/23 1200     Education   Cardiac Education Topics Pritikin   Psychologist, forensic Exercise Education   Exercise Education Improving Performance   Instruction Review Code 1- Verbalizes Understanding   Class Start Time 1150   Class Stop Time 1230   Class Time Calculation (min) 40 min    Row Name 02/18/23 1500     Education   Cardiac Education Topics Pritikin   Glass blower/designer Nutrition   Nutrition Workshop Fueling a Forensic psychologist   Instruction Review Code 1- Tax inspector   Class Start Time 1400   Class Stop Time 1452   Class Time Calculation (min) 52 min    Row Name 02/20/23 1500     Education   Cardiac Education Topics Pritikin   Select  Personnel officer   Instruction Review Code 1- Verbalizes Understanding   Class Start Time 1400   Class Stop Time 1445   Class Time Calculation  (min) 45 min    Row Name 02/22/23 1400     Education   Cardiac Education Topics Pritikin   Nurse, children's Exercise Physiologist   Select Psychosocial   Psychosocial How Our Thoughts Can Heal Our Hearts   Instruction Review Code 1- Verbalizes Understanding   Class Start Time 1400   Class Stop Time 1450   Class Time Calculation (min) 50 min    Row Name 03/04/23 1500     Education   Cardiac Education Topics Pritikin   Geographical information systems officer Psychosocial   Psychosocial Workshop From Head to Heart: The Power of a Healthy Outlook   Instruction Review Code 1- Verbalizes Understanding   Class Start Time 1400   Class Stop Time 1446   Class Time Calculation (min) 46 min    Row Name 03/06/23 1500     Education   Cardiac Education Topics Pritikin   Customer service manager   Weekly Topic Adding Flavor - Sodium-Free   Instruction Review Code 1- Verbalizes Understanding   Class Start Time 1400   Class Stop Time 1437   Class Time Calculation (min) 37 min    Row Name 03/08/23 1400     Education   Cardiac Education Topics Pritikin   Hospital doctor Education   General Education Heart Disease Risk Reduction   Instruction Review Code 1- Verbalizes Understanding   Class Start Time 1355   Class Stop Time 1436   Class Time Calculation (min) 41 min    Row Name 03/11/23 1500     Education   Cardiac Education Topics Pritikin   Licensed conveyancer Nutrition   Nutrition Overview of the Pritikin Eating Plan   Instruction Review Code 1- Verbalizes Understanding   Class Start Time 1400   Class Stop Time 1452   Class Time Calculation (min) 52 min            Core Videos: Exercise    Move It!  Clinical staff conducted group or individual  video education with verbal and written material and guidebook.  Patient learns the recommended Pritikin exercise program. Exercise with the goal of living a long, healthy life. Some of the health benefits of exercise include controlled diabetes, healthier blood pressure levels, improved cholesterol levels, improved heart and lung capacity, improved sleep, and better body composition. Everyone should speak with their doctor before starting or changing an exercise routine.  Biomechanical Limitations Clinical staff conducted group or individual video education with verbal and written material and guidebook.  Patient learns how biomechanical limitations can impact exercise and how we can mitigate and possibly overcome limitations to have an impactful and balanced exercise routine.  Body Composition Clinical staff conducted group or individual video education with verbal and written material and guidebook.  Patient learns that body composition (ratio of muscle mass to fat mass) is a key component to assessing overall fitness, rather  than body weight alone. Increased fat mass, especially visceral belly fat, can put [place] at increased risk for metabolic syndrome, type 2 diabetes, heart disease, and even death. It is recommended to combine diet and exercise (cardiovascular and resistance training) to improve your body composition. Seek guidance from your physician and exercise physiologist before implementing an exercise routine.  Exercise Action Plan Clinical staff conducted group or individual video education with verbal and written material and guidebook.  Patient learns the recommended strategies to achieve and enjoy long-term exercise adherence, including variety, self-motivation, self-efficacy, and positive decision making. Benefits of exercise include fitness, good health, weight management, more energy, better sleep, less stress, and overall well-being.  Medical   Heart Disease Risk Reduction Clinical  staff conducted group or individual video education with verbal and written material and guidebook.  Patient learns our heart is our most vital organ as it circulates oxygen, nutrients, white blood cells, and hormones throughout the entire body, and carries waste away. Data supports a plant-based eating plan like the Pritikin Program for its effectiveness in slowing progression of and reversing heart disease. The video provides a number of recommendations to address heart disease.   Metabolic Syndrome and Belly Fat  Clinical staff conducted group or individual video education with verbal and written material and guidebook.  Patient learns what metabolic syndrome is, how it leads to heart disease, and how one can reverse it and keep it from coming back. You have metabolic syndrome if you have 3 of the following 5 criteria: abdominal obesity, high blood pressure, high triglycerides, low HDL cholesterol, and high blood sugar.  Hypertension and Heart Disease Clinical staff conducted group or individual video education with verbal and written material and guidebook.  Patient learns that high blood pressure, or hypertension, is very common in the [place]. Hypertension is largely due to excessive salt intake, but other important risk factors include being overweight, physical inactivity, drinking too much alcohol, smoking, and not eating enough potassium from fruits and vegetables. High blood pressure is a leading risk factor for heart attack, stroke, congestive heart failure, dementia, kidney failure, and premature death. Long-term effects of excessive salt intake include stiffening of the arteries and thickening of heart muscle and organ damage. Recommendations include ways to reduce hypertension and the risk of heart disease.  Diseases of Our Time - Focusing on Diabetes Clinical staff conducted group or individual video education with verbal and written material and guidebook.  Patient learns why the  best way to stop diseases of our time is prevention, through food and other lifestyle changes. Medicine (such as prescription pills and surgeries) is often only a Band-Aid on the problem, not a long-term solution. Most common diseases of our time include obesity, type 2 diabetes, hypertension, heart disease, and cancer. The Pritikin Program is recommended and has been proven to help reduce, reverse, and/or prevent the damaging effects of metabolic syndrome.  Nutrition   Overview of the Pritikin Eating Plan  Clinical staff conducted group or individual video education with verbal and written material and guidebook.  Patient learns about the Pritikin Eating Plan for disease risk reduction. The Pritikin Eating Plan emphasizes a wide variety of unrefined, minimally-processed carbohydrates, like fruits, vegetables, whole grains, and legumes. Go, Caution, and Stop food choices are explained. Plant-based and lean animal proteins are emphasized. Rationale provided for low sodium intake for blood pressure control, low added sugars for blood sugar stabilization, and low added fats and oils for coronary artery disease risk reduction and weight  management.  Calorie Density  Clinical staff conducted group or individual video education with verbal and written material and guidebook.  Patient learns about calorie density and how it impacts the Pritikin Eating Plan. Knowing the characteristics of the food you choose will help you decide whether those foods will lead to weight gain or weight loss, and whether you want to consume more or less of them. Weight loss is usually a side effect of the Pritikin Eating Plan because of its focus on low calorie-dense foods.  Label Reading  Clinical staff conducted group or individual video education with verbal and written material and guidebook.  Patient learns about the Pritikin recommended label reading guidelines and corresponding recommendations regarding calorie density,  added sugars, sodium content, and whole grains.  Dining Out - Part 1  Clinical staff conducted group or individual video education with verbal and written material and guidebook.  Patient learns that restaurant meals can be sabotaging because they can be so high in calories, fat, sodium, and/or sugar. Patient learns recommended strategies on how to positively address this and avoid unhealthy pitfalls.  Facts on Fats  Clinical staff conducted group or individual video education with verbal and written material and guidebook.  Patient learns that lifestyle modifications can be just as effective, if not more so, as many medications for lowering your risk of heart disease. A Pritikin lifestyle can help to reduce your risk of inflammation and atherosclerosis (cholesterol build-up, or plaque, in the artery walls). Lifestyle interventions such as dietary choices and physical activity address the cause of atherosclerosis. A review of the types of fats and their impact on blood cholesterol levels, along with dietary recommendations to reduce fat intake is also included.  Nutrition Action Plan  Clinical staff conducted group or individual video education with verbal and written material and guidebook.  Patient learns how to incorporate Pritikin recommendations into their lifestyle. Recommendations include planning and keeping personal health goals in mind as an important part of their success.  Healthy Mind-Set    Healthy Minds, Bodies, Hearts  Clinical staff conducted group or individual video education with verbal and written material and guidebook.  Patient learns how to identify when they are stressed. Video will discuss the impact of that stress, as well as the many benefits of stress management. Patient will also be introduced to stress management techniques. The way we think, act, and feel has an impact on our hearts.  How Our Thoughts Can Heal Our Hearts  Clinical staff conducted group or  individual video education with verbal and written material and guidebook.  Patient learns that negative thoughts can cause depression and anxiety. This can result in negative lifestyle behavior and serious health problems. Cognitive behavioral therapy is an effective method to help control our thoughts in order to change and improve our emotional outlook.  Additional Videos:  Exercise    Improving Performance  Clinical staff conducted group or individual video education with verbal and written material and guidebook.  Patient learns to use a non-linear approach by alternating intensity levels and lengths of time spent exercising to help burn more calories and lose more body fat. Cardiovascular exercise helps improve heart health, metabolism, hormonal balance, blood sugar control, and recovery from fatigue. Resistance training improves strength, endurance, balance, coordination, reaction time, metabolism, and muscle mass. Flexibility exercise improves circulation, posture, and balance. Seek guidance from your physician and exercise physiologist before implementing an exercise routine and learn your capabilities and proper form for all exercise.  Introduction to  Yoga  Clinical staff conducted group or individual video education with verbal and written material and guidebook.  Patient learns about yoga, a discipline of the coming together of mind, breath, and body. The benefits of yoga include improved flexibility, improved range of motion, better posture and core strength, increased lung function, weight loss, and positive self-image. Yoga's heart health benefits include lowered blood pressure, healthier heart rate, decreased cholesterol and triglyceride levels, improved immune function, and reduced stress. Seek guidance from your physician and exercise physiologist before implementing an exercise routine and learn your capabilities and proper form for all exercise.  Medical   Aging: Enhancing Your  Quality of Life  Clinical staff conducted group or individual video education with verbal and written material and guidebook.  Patient learns key strategies and recommendations to stay in good physical health and enhance quality of life, such as prevention strategies, having an advocate, securing a Health Care Proxy and Power of Attorney, and keeping a list of medications and system for tracking them. It also discusses how to avoid risk for bone loss.  Biology of Weight Control  Clinical staff conducted group or individual video education with verbal and written material and guidebook.  Patient learns that weight gain occurs because we consume more calories than we burn (eating more, moving less). Even if your body weight is normal, you may have higher ratios of fat compared to muscle mass. Too much body fat puts you at increased risk for cardiovascular disease, heart attack, stroke, type 2 diabetes, and obesity-related cancers. In addition to exercise, following the Pritikin Eating Plan can help reduce your risk.  Decoding Lab Results  Clinical staff conducted group or individual video education with verbal and written material and guidebook.  Patient learns that lab test reflects one measurement whose values change over time and are influenced by many factors, including medication, stress, sleep, exercise, food, hydration, pre-existing medical conditions, and more. It is recommended to use the knowledge from this video to become more involved with your lab results and evaluate your numbers to speak with your doctor.   Diseases of Our Time - Overview  Clinical staff conducted group or individual video education with verbal and written material and guidebook.  Patient learns that according to the CDC, 50% to 70% of chronic diseases (such as obesity, type 2 diabetes, elevated lipids, hypertension, and heart disease) are avoidable through lifestyle improvements including healthier food choices,  listening to satiety cues, and increased physical activity.  Sleep Disorders Clinical staff conducted group or individual video education with verbal and written material and guidebook.  Patient learns how good quality and duration of sleep are important to overall health and well-being. Patient also learns about sleep disorders and how they impact health along with recommendations to address them, including discussing with a physician.  Nutrition  Dining Out - Part 2 Clinical staff conducted group or individual video education with verbal and written material and guidebook.  Patient learns how to plan ahead and communicate in order to maximize their dining experience in a healthy and nutritious manner. Included are recommended food choices based on the type of restaurant the patient is visiting.   Fueling a Banker conducted group or individual video education with verbal and written material and guidebook.  There is a strong connection between our food choices and our health. Diseases like obesity and type 2 diabetes are very prevalent and are in large-part due to lifestyle choices. The Pritikin Eating Plan provides plenty  of food and hunger-curbing satisfaction. It is easy to follow, affordable, and helps reduce health risks.  Menu Workshop  Clinical staff conducted group or individual video education with verbal and written material and guidebook.  Patient learns that restaurant meals can sabotage health goals because they are often packed with calories, fat, sodium, and sugar. Recommendations include strategies to plan ahead and to communicate with the manager, chef, or server to help order a healthier meal.  Planning Your Eating Strategy  Clinical staff conducted group or individual video education with verbal and written material and guidebook.  Patient learns about the Pritikin Eating Plan and its benefit of reducing the risk of disease. The Pritikin Eating Plan does  not focus on calories. Instead, it emphasizes high-quality, nutrient-rich foods. By knowing the characteristics of the foods, we choose, we can determine their calorie density and make informed decisions.  Targeting Your Nutrition Priorities  Clinical staff conducted group or individual video education with verbal and written material and guidebook.  Patient learns that lifestyle habits have a tremendous impact on disease risk and progression. This video provides eating and physical activity recommendations based on your personal health goals, such as reducing LDL cholesterol, losing weight, preventing or controlling type 2 diabetes, and reducing high blood pressure.  Vitamins and Minerals  Clinical staff conducted group or individual video education with verbal and written material and guidebook.  Patient learns different ways to obtain key vitamins and minerals, including through a recommended healthy diet. It is important to discuss all supplements you take with your doctor.   Healthy Mind-Set    Smoking Cessation  Clinical staff conducted group or individual video education with verbal and written material and guidebook.  Patient learns that cigarette smoking and tobacco addiction pose a serious health risk which affects millions of people. Stopping smoking will significantly reduce the risk of heart disease, lung disease, and many forms of cancer. Recommended strategies for quitting are covered, including working with your doctor to develop a successful plan.  Culinary   Becoming a Set designer conducted group or individual video education with verbal and written material and guidebook.  Patient learns that cooking at home can be healthy, cost-effective, quick, and puts them in control. Keys to cooking healthy recipes will include looking at your recipe, assessing your equipment needs, planning ahead, making it simple, choosing cost-effective seasonal ingredients, and  limiting the use of added fats, salts, and sugars.  Cooking - Breakfast and Snacks  Clinical staff conducted group or individual video education with verbal and written material and guidebook.  Patient learns how important breakfast is to satiety and nutrition through the entire day. Recommendations include key foods to eat during breakfast to help stabilize blood sugar levels and to prevent overeating at meals later in the day. Planning ahead is also a key component.  Cooking - Educational psychologist conducted group or individual video education with verbal and written material and guidebook.  Patient learns eating strategies to improve overall health, including an approach to cook more at home. Recommendations include thinking of animal protein as a side on your plate rather than center stage and focusing instead on lower calorie dense options like vegetables, fruits, whole grains, and plant-based proteins, such as beans. Making sauces in large quantities to freeze for later and leaving the skin on your vegetables are also recommended to maximize your experience.  Cooking - Healthy Salads and Dressing Clinical staff conducted group or individual video education  with verbal and written material and guidebook.  Patient learns that vegetables, fruits, whole grains, and legumes are the foundations of the Pritikin Eating Plan. Recommendations include how to incorporate each of these in flavorful and healthy salads, and how to create homemade salad dressings. Proper handling of ingredients is also covered. Cooking - Soups and [employer] - Soups and Desserts Clinical staff conducted group or individual video education with verbal and written material and guidebook.  Patient learns that Pritikin soups and desserts make for easy, nutritious, and delicious snacks and meal components that are low in sodium, fat, sugar, and calorie density, while high in vitamins, minerals, and filling fiber.  Recommendations include simple and healthy ideas for soups and desserts.   Overview     The Pritikin Solution Program Overview Clinical staff conducted group or individual video education with verbal and written material and guidebook.  Patient learns that the results of the Pritikin Program have been documented in more than 100 articles published in peer-reviewed journals, and the benefits include reducing risk factors for (and, in some cases, even reversing) high cholesterol, high blood pressure, type 2 diabetes, obesity, and more! An overview of the three key pillars of the Pritikin Program will be covered: eating well, doing regular exercise, and having a healthy mind-set.  WORKSHOPS  Exercise: Exercise Basics: Building Your Action Plan Clinical staff led group instruction and group discussion with PowerPoint presentation and patient guidebook. To enhance the learning environment the use of posters, models and videos may be added. At the conclusion of this workshop, patients will comprehend the difference between physical activity and exercise, as well as the benefits of incorporating both, into their routine. Patients will understand the FITT (Frequency, Intensity, Time, and Type) principle and how to use it to build an exercise action plan. In addition, safety concerns and other considerations for exercise and cardiac rehab will be addressed by the presenter. The purpose of this lesson is to promote a comprehensive and effective weekly exercise routine in order to improve patients' overall level of fitness.   Managing Heart Disease: Your Path to a Healthier Heart Clinical staff led group instruction and group discussion with PowerPoint presentation and patient guidebook. To enhance the learning environment the use of posters, models and videos may be added.At the conclusion of this workshop, patients will understand the anatomy and physiology of the heart. Additionally, they will  understand how Pritikin's three pillars impact the risk factors, the progression, and the management of heart disease.  The purpose of this lesson is to provide a high-level overview of the heart, heart disease, and how the Pritikin lifestyle positively impacts risk factors.  Exercise Biomechanics Clinical staff led group instruction and group discussion with PowerPoint presentation and patient guidebook. To enhance the learning environment the use of posters, models and videos may be added. Patients will learn how the structural parts of their bodies function and how these functions impact their daily activities, movement, and exercise. Patients will learn how to promote a neutral spine, learn how to manage pain, and identify ways to improve their physical movement in order to promote healthy living. The purpose of this lesson is to expose patients to common physical limitations that impact physical activity. Participants will learn practical ways to adapt and manage aches and pains, and to minimize their effect on regular exercise. Patients will learn how to maintain good posture while sitting, walking, and lifting.  Balance Training and Fall Prevention  Clinical staff led group instruction and  group discussion with PowerPoint presentation and patient guidebook. To enhance the learning environment the use of posters, models and videos may be added. At the conclusion of this workshop, patients will understand the importance of their sensorimotor skills (vision, proprioception, and the vestibular system) in maintaining their ability to balance as they age. Patients will apply a variety of balancing exercises that are appropriate for their current level of function. Patients will understand the common causes for poor balance, possible solutions to these problems, and ways to modify their physical environment in order to minimize their fall risk. The purpose of this lesson is to teach patients  about the importance of maintaining balance as they age and ways to minimize their risk of falling.  WORKSHOPS   Nutrition:  Fueling a Ship broker led group instruction and group discussion with PowerPoint presentation and patient guidebook. To enhance the learning environment the use of posters, models and videos may be added. Patients will review the foundational principles of the Pritikin Eating Plan and understand what constitutes a serving size in each of the food groups. Patients will also learn Pritikin-friendly foods that are better choices when away from home and review make-ahead meal and snack options. Calorie density will be reviewed and applied to three nutrition priorities: weight maintenance, weight loss, and weight gain. The purpose of this lesson is to reinforce (in a group setting) the key concepts around what patients are recommended to eat and how to apply these guidelines when away from home by planning and selecting Pritikin-friendly options. Patients will understand how calorie density may be adjusted for different weight management goals.  Mindful Eating  Clinical staff led group instruction and group discussion with PowerPoint presentation and patient guidebook. To enhance the learning environment the use of posters, models and videos may be added. Patients will briefly review the concepts of the Pritikin Eating Plan and the importance of low-calorie dense foods. The concept of mindful eating will be introduced as well as the importance of paying attention to internal hunger signals. Triggers for non-hunger eating and techniques for dealing with triggers will be explored. The purpose of this lesson is to provide patients with the opportunity to review the basic principles of the Pritikin Eating Plan, discuss the value of eating mindfully and how to measure internal cues of hunger and fullness using the Hunger Scale. Patients will also discuss reasons for non-hunger  eating and learn strategies to use for controlling emotional eating.  Targeting Your Nutrition Priorities Clinical staff led group instruction and group discussion with PowerPoint presentation and patient guidebook. To enhance the learning environment the use of posters, models and videos may be added. Patients will learn how to determine their genetic susceptibility to disease by reviewing their family history. Patients will gain insight into the importance of diet as part of an overall healthy lifestyle in mitigating the impact of genetics and other environmental insults. The purpose of this lesson is to provide patients with the opportunity to assess their personal nutrition priorities by looking at their family history, their own health history and current risk factors. Patients will also be able to discuss ways of prioritizing and modifying the Pritikin Eating Plan for their highest risk areas  Menu  Clinical staff led group instruction and group discussion with PowerPoint presentation and patient guidebook. To enhance the learning environment the use of posters, models and videos may be added. Using menus brought in from [employer], or printed from [employer], patients will apply the  Pritikin dining out guidelines that were presented in the [employer]. Patients will also be able to practice these guidelines in a variety of provided scenarios. The purpose of this lesson is to provide patients with the opportunity to practice hands-on learning of the Pritikin Dining Out guidelines with actual menus and practice scenarios.  Label Reading Clinical staff led group instruction and group discussion with PowerPoint presentation and patient guidebook. To enhance the learning environment the use of posters, models and videos may be added. Patients will review and discuss the Pritikin label reading guidelines presented in Pritikin's Label Reading Educational series video.  Using fool labels brought in from local grocery stores and markets, patients will apply the label reading guidelines and determine if the packaged food meet the Pritikin guidelines. The purpose of this lesson is to provide patients with the opportunity to review, discuss, and practice hands-on learning of the Pritikin Label Reading guidelines with actual packaged food labels. Cooking School  Pritikin's [employer] are designed to teach patients ways to prepare quick, simple, and affordable recipes at home. The importance of nutrition's role in chronic disease risk reduction is reflected in its emphasis in the overall Pritikin program. By learning how to prepare essential core Pritikin Eating Plan recipes, patients will increase control over what they eat; be able to customize the flavor of foods without the use of added salt, sugar, or fat; and improve the quality of the food they consume. By learning a set of core recipes which are easily assembled, quickly prepared, and affordable, patients are more likely to prepare more healthy foods at home. These workshops focus on convenient breakfasts, simple entres, side dishes, and desserts which can be prepared with minimal effort and are consistent with nutrition recommendations for cardiovascular risk reduction. Cooking [employer] are taught by a Armed forces logistics/support/administrative officer (RD) who has been trained by the [employer]. The chef or RD has a clear understanding of the importance of minimizing - if not completely eliminating - added fat, sugar, and sodium in recipes. Throughout the series of Cooking School Workshop sessions, patients will learn about healthy ingredients and efficient methods of cooking to build confidence in their capability to prepare    Cooking School weekly topics:  Adding Flavor- Sodium-Free  Fast and Healthy Breakfasts  Powerhouse Plant-Based Proteins  Satisfying Salads and Dressings  Simple Sides and  Sauces  International Cuisine-Spotlight on the [employer] Zones  Delicious Desserts  Savory Soups  [employer] - Meals in a Astronomer Appetizers and Snacks  Comforting Weekend Breakfasts  One-Pot Wonders   Fast Evening Meals  Landscape architect Your Pritikin Plate  WORKSHOPS   Healthy Mindset (Psychosocial):  Focused Goals, Sustainable Changes Clinical staff led group instruction and group discussion with PowerPoint presentation and patient guidebook. To enhance the learning environment the use of posters, models and videos may be added. Patients will be able to apply effective goal setting strategies to establish at least one personal goal, and then take consistent, meaningful action toward that goal. They will learn to identify common barriers to achieving personal goals and develop strategies to overcome them. Patients will also gain an understanding of how our mind-set can impact our ability to achieve goals and the importance of cultivating a positive and growth-oriented mind-set. The purpose of this lesson is to provide patients with a deeper understanding of how to set and achieve personal goals, as well as the tools and strategies needed  to overcome common obstacles which may arise along the way.  From Head to Heart: The Power of a Healthy Outlook  Clinical staff led group instruction and group discussion with PowerPoint presentation and patient guidebook. To enhance the learning environment the use of posters, models and videos may be added. Patients will be able to recognize and describe the impact of emotions and mood on physical health. They will discover the importance of self-care and explore self-care practices which may work for them. Patients will also learn how to utilize the 4 C's to cultivate a healthier outlook and better manage stress and challenges. The purpose of this lesson is to demonstrate to patients how a healthy outlook is an essential part of  maintaining good health, especially as they continue their cardiac rehab journey.  Healthy Sleep for a Healthy Heart Clinical staff led group instruction and group discussion with PowerPoint presentation and patient guidebook. To enhance the learning environment the use of posters, models and videos may be added. At the conclusion of this workshop, patients will be able to demonstrate knowledge of the importance of sleep to overall health, well-being, and quality of life. They will understand the symptoms of, and treatments for, common sleep disorders. Patients will also be able to identify daytime and nighttime behaviors which impact sleep, and they will be able to apply these tools to help manage sleep-related challenges. The purpose of this lesson is to provide patients with a general overview of sleep and outline the importance of quality sleep. Patients will learn about a few of the most common sleep disorders. Patients will also be introduced to the concept of "sleep hygiene," and discover ways to self-manage certain sleeping problems through simple daily behavior changes. Finally, the workshop will motivate patients by clarifying the links between quality sleep and their goals of heart-healthy living.   Recognizing and Reducing Stress Clinical staff led group instruction and group discussion with PowerPoint presentation and patient guidebook. To enhance the learning environment the use of posters, models and videos may be added. At the conclusion of this workshop, patients will be able to understand the types of stress reactions, differentiate between acute and chronic stress, and recognize the impact that chronic stress has on their health. They will also be able to apply different coping mechanisms, such as reframing negative self-talk. Patients will have the opportunity to practice a variety of stress management techniques, such as deep abdominal breathing, progressive muscle relaxation, and/or  guided imagery.  The purpose of this lesson is to educate patients on the role of stress in their lives and to provide healthy techniques for coping with it.  Learning Barriers/Preferences:  Learning Barriers/Preferences - 01/10/23 1503       Learning Barriers/Preferences   Learning Barriers None    Learning Preferences Group Instruction;Individual Instruction;Skilled Demonstration             Education Topics:  Knowledge Questionnaire Score:  Knowledge Questionnaire Score - 01/10/23 1504       Knowledge Questionnaire Score   Pre Score 19/24             Core Components/Risk Factors/Patient Goals at Admission:  Personal Goals and Risk Factors at Admission - 01/10/23 1504       Core Components/Risk Factors/Patient Goals on Admission    Weight Management Yes;Obesity;Weight Loss    Intervention Weight Management: Develop a combined nutrition and exercise program designed to reach desired caloric intake, while maintaining appropriate intake of nutrient and fiber, sodium and fats, and  appropriate energy expenditure required for the weight goal.;Weight Management: Provide education and appropriate resources to help participant work on and attain dietary goals.;Weight Management/Obesity: Establish reasonable short term and long term weight goals.;Obesity: Provide education and appropriate resources to help participant work on and attain dietary goals.    Admit Weight 212 lb 11.9 oz (96.5 kg)    Expected Outcomes Short Term: Continue to assess and modify interventions until short term weight is achieved;Long Term: Adherence to nutrition and physical activity/exercise program aimed toward attainment of established weight goal;Weight Loss: Understanding of general recommendations for a balanced deficit meal plan, which promotes 1-2 lb weight loss per week and includes a negative energy balance of [phone] kcal/d;Understanding recommendations for meals to include 15-35% energy as protein,  25-35% energy from fat, 35-60% energy from carbohydrates, less than 200mg  of dietary cholesterol, 20-35 gm of total fiber daily;Understanding of distribution of calorie intake throughout the day with the consumption of 4-5 meals/snacks    Tobacco Cessation Yes    Number of packs per day 1/2 PPD   Referral faxed to [facility] Quitline. Patient says she is ready to quit and has cut back   Intervention Assist the participant in steps to quit. Provide individualized education and counseling about committing to Tobacco Cessation, relapse prevention, and pharmacological support that can be provided by physician.;Education officer, environmental, assist with locating and accessing local/national Quit Smoking programs, and support quit date choice.    Expected Outcomes Short Term: Will demonstrate readiness to quit, by selecting a quit date.;Short Term: Will quit all tobacco product use, adhering to prevention of relapse plan.;Long Term: Complete abstinence from all tobacco products for at least 12 months from quit date.    Diabetes Yes    Intervention Provide education about signs/symptoms and action to take for hypo/hyperglycemia.;Provide education about proper nutrition, including hydration, and aerobic/resistive exercise prescription along with prescribed medications to achieve blood glucose in normal ranges: Fasting glucose 65-99 mg/dL    Expected Outcomes Short Term: Participant verbalizes understanding of the signs/symptoms and immediate care of hyper/hypoglycemia, proper foot care and importance of medication, aerobic/resistive exercise and nutrition plan for blood glucose control.;Long Term: Attainment of HbA1C < 7%.    Hypertension Yes    Intervention Monitor prescription use compliance.;Provide education on lifestyle modifcations including regular physical activity/exercise, weight management, moderate sodium restriction and increased consumption of fresh fruit, vegetables, and low fat dairy, alcohol moderation,  and smoking cessation.    Expected Outcomes Short Term: Continued assessment and intervention until BP is < 140/59mm HG in hypertensive participants. < 130/31mm HG in hypertensive participants with diabetes, heart failure or chronic kidney disease.;Long Term: Maintenance of blood pressure at goal levels.    Lipids Yes    Intervention Provide education and support for participant on nutrition & aerobic/resistive exercise along with prescribed medications to achieve LDL 70mg , HDL >40mg .    Expected Outcomes Short Term: Participant states understanding of desired cholesterol values and is compliant with medications prescribed. Participant is following exercise prescription and nutrition guidelines.;Long Term: Cholesterol controlled with medications as prescribed, with individualized exercise RX and with personalized nutrition plan. Value goals: LDL < 70mg , HDL > 40 mg.    Stress Yes    Intervention Offer individual and/or small group education and counseling on adjustment to heart disease, stress management and health-related lifestyle change. Teach and support self-help strategies.;Refer participants experiencing significant psychosocial distress to appropriate mental health specialists for further evaluation and treatment. When possible, include family members and significant others in education/counseling  sessions.    Expected Outcomes Short Term: Participant demonstrates changes in health-related behavior, relaxation and other stress management skills, ability to obtain effective social support, and compliance with psychotropic medications if prescribed.;Long Term: Emotional wellbeing is indicated by absence of clinically significant psychosocial distress or social isolation.    Personal Goal Other Yes    Personal Goal Short term:modification with soda drinking Long term: stamina wt loss (60 lbs)    Intervention Will continue to monitor pt and progress workloads as tolerated without sign or symptom     Expected Outcomes Pt will achieve her goals             Core Components/Risk Factors/Patient Goals Review:   Goals and Risk Factor Review     Row Name 02/13/23 1348 03/12/23 1348           Core Components/Risk Factors/Patient Goals Review   Personal Goals Review Weight Management/Obesity;Lipids;Diabetes;Stress;Hypertension Weight Management/Obesity;Lipids;Diabetes;Stress;Hypertension      Review Aveanna has been doing well with exercise. Vital signs and CBG's have been stable. occasional exertional BP elevations have been noted. Josiah says she is trying to cut back on smoking cigarettes. Gladis has been doing well with exercise. Vital signs and CBG's have been stable. Tawanna says she is trying to cut back on smoking cigarettes. Lehua is now jogging intermitently on the treadmill and has lost 1.6 kg since starting cardiac rehab. Kamra will complete cardiac rehab on 03/25/23      Expected Outcomes Michaeleen will continue to participate in cardiac rehab for exercise, nutrition and lifestyle modifications Shada will continue to participate in cardiac rehab for exercise, nutrition and lifestyle modifications               Core Components/Risk Factors/Patient Goals at Discharge (Final Review):   Goals and Risk Factor Review - 03/12/23 1348       Core Components/Risk Factors/Patient Goals Review   Personal Goals Review Weight Management/Obesity;Lipids;Diabetes;Stress;Hypertension    Review Sakita has been doing well with exercise. Vital signs and CBG's have been stable. Brandalynn says she is trying to cut back on smoking cigarettes. Buffey is now jogging intermitently on the treadmill and has lost 1.6 kg since starting cardiac rehab. Damayah will complete cardiac rehab on 03/25/23    Expected Outcomes Inioluwa will continue to participate in cardiac rehab for exercise, nutrition and lifestyle modifications             ITP Comments:  ITP Comments     Row Name 01/10/23 1323 01/14/23 1706 02/13/23  1339 03/12/23 1344     ITP Comments Armanda Magic, MD:  Medical Director.  Introduction to the [facility] / [facility]ensive [facility].  Initial orientation packet reviewed with the patient. 30 Day ITP Review. Lavoris did not start exercise at cardiac rehab on 01/14/23 due to hypoglycemia. Plans to return to exercise on Wednesday. 30 Day ITP Review. Gertis has good attendance and participation in exercise at cardiac rehab 30 Day ITP Review. Ylenia continues to have  good attendance and participation in exercise at cardiac rehab. Winnifred will complete cardiac rehab on 03/25/23             Comments: See ITP comments.Thayer Headings RN BSN

## 2023-03-13 ENCOUNTER — Encounter (HOSPITAL_COMMUNITY): Payer: BC Managed Care – PPO

## 2023-03-13 ENCOUNTER — Encounter (HOSPITAL_COMMUNITY)
Admission: RE | Admit: 2023-03-13 | Discharge: 2023-03-13 | Disposition: A | Discharge status: Home / Self Care | Payer: BC Managed Care – PPO | Source: Ambulatory Visit | Attending: Cardiovascular Disease | Admitting: Cardiovascular Disease

## 2023-03-13 DIAGNOSIS — I2111 ST elevation (STEMI) myocardial infarction involving right coronary artery: Secondary | ICD-10-CM | POA: Diagnosis not present

## 2023-03-13 DIAGNOSIS — Z955 Presence of coronary angioplasty implant and graft: Secondary | ICD-10-CM

## 2023-03-15 ENCOUNTER — Encounter (HOSPITAL_COMMUNITY): Payer: BC Managed Care – PPO

## 2023-03-15 ENCOUNTER — Encounter (HOSPITAL_COMMUNITY): Admission: RE | Admit: 2023-03-15 | Payer: BC Managed Care – PPO | Source: Ambulatory Visit

## 2023-03-15 DIAGNOSIS — Z955 Presence of coronary angioplasty implant and graft: Secondary | ICD-10-CM | POA: Diagnosis not present

## 2023-03-15 DIAGNOSIS — I2111 ST elevation (STEMI) myocardial infarction involving right coronary artery: Secondary | ICD-10-CM

## 2023-03-20 ENCOUNTER — Encounter (HOSPITAL_COMMUNITY): Payer: BC Managed Care – PPO

## 2023-03-22 ENCOUNTER — Encounter (HOSPITAL_COMMUNITY): Payer: BC Managed Care – PPO

## 2023-03-22 ENCOUNTER — Encounter (HOSPITAL_COMMUNITY)
Admission: RE | Admit: 2023-03-22 | Discharge: 2023-03-22 | Disposition: A | Discharge status: Home / Self Care | Payer: BC Managed Care – PPO | Source: Ambulatory Visit | Attending: Cardiovascular Disease | Admitting: Cardiovascular Disease

## 2023-03-22 DIAGNOSIS — I2111 ST elevation (STEMI) myocardial infarction involving right coronary artery: Secondary | ICD-10-CM

## 2023-03-22 DIAGNOSIS — Z955 Presence of coronary angioplasty implant and graft: Secondary | ICD-10-CM | POA: Insufficient documentation

## 2023-03-22 DIAGNOSIS — I252 Old myocardial infarction: Secondary | ICD-10-CM | POA: Diagnosis not present

## 2023-03-22 DIAGNOSIS — Z48812 Encounter for surgical aftercare following surgery on the circulatory system: Secondary | ICD-10-CM | POA: Diagnosis not present

## 2023-03-22 DIAGNOSIS — E162 Hypoglycemia, unspecified: Secondary | ICD-10-CM | POA: Diagnosis not present

## 2023-03-25 ENCOUNTER — Encounter (HOSPITAL_COMMUNITY): Payer: BC Managed Care – PPO

## 2023-03-27 ENCOUNTER — Encounter (HOSPITAL_COMMUNITY): Payer: BC Managed Care – PPO

## 2023-03-29 ENCOUNTER — Encounter (HOSPITAL_COMMUNITY): Payer: BC Managed Care – PPO

## 2023-04-01 ENCOUNTER — Encounter (HOSPITAL_COMMUNITY)
Admission: RE | Admit: 2023-04-01 | Discharge: 2023-04-01 | Disposition: A | Discharge status: Home / Self Care | Payer: BC Managed Care – PPO | Source: Ambulatory Visit | Attending: Cardiovascular Disease

## 2023-04-01 DIAGNOSIS — E162 Hypoglycemia, unspecified: Secondary | ICD-10-CM | POA: Diagnosis not present

## 2023-04-01 DIAGNOSIS — I2111 ST elevation (STEMI) myocardial infarction involving right coronary artery: Secondary | ICD-10-CM | POA: Diagnosis not present

## 2023-04-01 DIAGNOSIS — I252 Old myocardial infarction: Secondary | ICD-10-CM | POA: Diagnosis not present

## 2023-04-01 DIAGNOSIS — Z48812 Encounter for surgical aftercare following surgery on the circulatory system: Secondary | ICD-10-CM | POA: Diagnosis not present

## 2023-04-01 DIAGNOSIS — Z955 Presence of coronary angioplasty implant and graft: Secondary | ICD-10-CM

## 2023-04-03 ENCOUNTER — Encounter (HOSPITAL_COMMUNITY)
Admission: RE | Admit: 2023-04-03 | Discharge: 2023-04-03 | Disposition: A | Discharge status: Home / Self Care | Payer: BC Managed Care – PPO | Source: Ambulatory Visit | Attending: Cardiovascular Disease | Admitting: Cardiovascular Disease

## 2023-04-03 DIAGNOSIS — I252 Old myocardial infarction: Secondary | ICD-10-CM | POA: Diagnosis not present

## 2023-04-03 DIAGNOSIS — I2111 ST elevation (STEMI) myocardial infarction involving right coronary artery: Secondary | ICD-10-CM

## 2023-04-03 DIAGNOSIS — E162 Hypoglycemia, unspecified: Secondary | ICD-10-CM | POA: Diagnosis not present

## 2023-04-03 DIAGNOSIS — Z48812 Encounter for surgical aftercare following surgery on the circulatory system: Secondary | ICD-10-CM | POA: Diagnosis not present

## 2023-04-03 DIAGNOSIS — Z955 Presence of coronary angioplasty implant and graft: Secondary | ICD-10-CM | POA: Diagnosis not present

## 2023-04-05 ENCOUNTER — Encounter (HOSPITAL_COMMUNITY)
Admission: RE | Admit: 2023-04-05 | Discharge: 2023-04-05 | Disposition: A | Discharge status: Home / Self Care | Payer: BC Managed Care – PPO | Source: Ambulatory Visit | Attending: Cardiovascular Disease | Admitting: Cardiovascular Disease

## 2023-04-05 DIAGNOSIS — Z955 Presence of coronary angioplasty implant and graft: Secondary | ICD-10-CM | POA: Diagnosis not present

## 2023-04-05 DIAGNOSIS — Z48812 Encounter for surgical aftercare following surgery on the circulatory system: Secondary | ICD-10-CM | POA: Diagnosis not present

## 2023-04-05 DIAGNOSIS — I2111 ST elevation (STEMI) myocardial infarction involving right coronary artery: Secondary | ICD-10-CM | POA: Diagnosis not present

## 2023-04-05 DIAGNOSIS — E162 Hypoglycemia, unspecified: Secondary | ICD-10-CM | POA: Diagnosis not present

## 2023-04-05 DIAGNOSIS — I252 Old myocardial infarction: Secondary | ICD-10-CM | POA: Diagnosis not present

## 2023-04-08 ENCOUNTER — Encounter (HOSPITAL_COMMUNITY): Payer: BC Managed Care – PPO

## 2023-04-10 ENCOUNTER — Encounter (HOSPITAL_COMMUNITY)
Admission: RE | Admit: 2023-04-10 | Discharge: 2023-04-10 | Disposition: A | Discharge status: Home / Self Care | Payer: BC Managed Care – PPO | Source: Ambulatory Visit | Attending: Cardiovascular Disease

## 2023-04-10 VITALS — Ht 65.5 in | Wt 204.6 lb

## 2023-04-10 DIAGNOSIS — E162 Hypoglycemia, unspecified: Secondary | ICD-10-CM | POA: Diagnosis not present

## 2023-04-10 DIAGNOSIS — Z48812 Encounter for surgical aftercare following surgery on the circulatory system: Secondary | ICD-10-CM | POA: Diagnosis not present

## 2023-04-10 DIAGNOSIS — I252 Old myocardial infarction: Secondary | ICD-10-CM | POA: Diagnosis not present

## 2023-04-10 DIAGNOSIS — Z955 Presence of coronary angioplasty implant and graft: Secondary | ICD-10-CM | POA: Diagnosis not present

## 2023-04-10 DIAGNOSIS — I2111 ST elevation (STEMI) myocardial infarction involving right coronary artery: Secondary | ICD-10-CM | POA: Diagnosis not present

## 2023-04-10 NOTE — Progress Notes (Signed)
Cardiac Individual Treatment Plan  Patient Details  Name: REFUGIO BREKKEN MRN: [id number] Date of Birth: Sep 29, 1970 Referring Provider:   Flowsheet Row INTENSIVE CARDIAC REHAB ORIENT from 01/10/2023 in [facility]  Referring Provider Dr. Tonny Bollman MD       Initial Encounter Date:  Flowsheet Row INTENSIVE CARDIAC REHAB ORIENT from 01/10/2023 in [facility]  Date 01/10/23       Visit Diagnosis: 12/21/22 ST elevation myocardial infarction involving right coronary artery (HCC)  12/21/22 S/P DES Mid RCA  Patient's Home Medications on Admission:  Current Outpatient Medications:    acetaminophen (TYLENOL) 325 MG tablet, Take 325-650 mg by mouth as needed for headache., Disp: , Rfl:    aspirin 81 MG chewable tablet, Chew 1 tablet (81 mg total) by mouth daily., Disp: , Rfl:    cetirizine (ZYRTEC ALLERGY) 10 MG tablet, Take 1 tablet (10 mg total) by mouth daily., Disp: 90 tablet, Rfl: 1   Continuous Glucose Sensor (FREESTYLE LIBRE 3 SENSOR) MISC, USE AS DIRECTED SUBCUTANEOUS EVERY 15 DAYS for 30 days, Disp: , Rfl:    cyclobenzaprine (FLEXERIL) 10 MG tablet, Take 10 mg by mouth daily as needed for muscle spasms., Disp: , Rfl:    fenofibrate 160 MG tablet, Take 1 tablet (160 mg total) by mouth daily. (Patient taking differently: Take 160 mg by mouth daily in the afternoon.), Disp: 90 tablet, Rfl: 1   fluticasone (FLONASE) 50 MCG/ACT nasal spray, Place 2 sprays into both nostrils daily., Disp: 16 g, Rfl: 6   glimepiride (AMARYL) 2 MG tablet, Take 2 mg by mouth every morning., Disp: , Rfl:    Glucagon (BAQSIMI ONE PACK) 3 MG/DOSE POWD, Place 1 Dose into the nose as needed (low blood sugar)., Disp: , Rfl:    glucose blood test strip, Use as instructed, Disp: 100 each, Rfl: 12   Icosapent Ethyl (VASCEPA) 0.5 g CAPS, Take 3 capsules (1.5 g total) by mouth daily., Disp: , Rfl:     insulin aspart (NOVOLOG) 100 UNIT/ML injection, Inject 10-30 Units into the skin 3 (three) times daily before meals. Take sliding scale as directed, Disp: , Rfl:    insulin degludec (TRESIBA FLEXTOUCH) 200 UNIT/ML FlexTouch Pen, Take 25 units in the morning and 52 units at night (Patient taking differently: 20 Units in the morning and at bedtime. Take 20 units in the morning and 20 units at night), Disp: 30 mL, Rfl: 3   Lancets (FREESTYLE) lancets, Use as instructed, Disp: 100 each, Rfl: 12   metoprolol succinate (TOPROL-XL) 25 MG 24 hr tablet, Take 1 tablet (25 mg total) by mouth daily., Disp: 90 tablet, Rfl: 3   MOUNJARO 2.5 MG/0.5ML Pen, Inject 2.5 mg into the skin once a week., Disp: , Rfl:    nitroGLYCERIN (NITROSTAT) 0.4 MG SL tablet, Place 1 tablet (0.4 mg total) under the tongue every 5 (five) minutes as needed for chest pain., Disp: 25 tablet, Rfl: 6   olmesartan (BENICAR) 40 MG tablet, Take 1 tablet (40 mg total) by mouth daily., Disp: 90 tablet, Rfl: 3   rosuvastatin (CRESTOR) 10 MG tablet, Take 1 tablet (10 mg total) by mouth daily., Disp: 90 tablet, Rfl: 3   ticagrelor (BRILINTA) 90 MG TABS tablet, Take 1 tablet (90 mg total) by mouth 2 (two) times daily., Disp: 180 tablet, Rfl: 3  Past Medical History: Past Medical History:  Diagnosis Date   Acute ST  elevation myocardial infarction (STEMI) of inferior wall (HCC) 12/21/2022   Arthritis    Bell's palsy    hx of 15 years ago approx   CAD in native artery with cardiac cath and DES to [facility] 12/21/22 12/22/2022   Carpal tunnel syndrome    Chronic ear infection    Chronic pancreatitis (HCC)    Diabetes (HCC)    Diabetes mellitus type II, controlled (HCC)    Hemorrhoids    HTN (hypertension)    pt reports b/p runs high when goes to MD office   Hyperlipidemia    Obesity    Pancreatitis, acute     Tobacco Use: Social History   Tobacco Use  Smoking Status Every Day   Current packs/day: 0.50   Average packs/day: 0.5 packs/day for  36.7 years (18.4 ttl pk-yrs)   Types: Cigarettes   Start date: 1988  Smokeless Tobacco Never  Tobacco Comments   01/10/23 Referral faxed to [facility] QUITLINE    Labs: Review Flowsheet  More data exists      Latest Ref Rng & Units 12/07/2021 01/15/2022 04/10/2022 12/22/2022 02/15/2023  Labs for ITP Cardiac and Pulmonary Rehab  Cholestrol 100 - 199 mg/dL - - - 295  284   LDL (calc) 0 - 99 mg/dL - - - UNABLE TO CALCULATE IF TRIGLYCERIDE OVER 400 mg/dL  43   Direct LDL 0 - 99 mg/dL - - - 45  -  HDL-C >13 mg/dL - - - 23  24   Trlycerides 0 - 149 mg/dL 2440.1 Triglyceride is over 400; calculations on Lipids are invalid.  - - 569  286   Hemoglobin A1c 4.8 - 5.6 % - 12.7  7.7  10.8  -    Details            Capillary Blood Glucose: Lab Results  Component Value Date   GLUCAP 172 (H) 03/06/2023   GLUCAP 114 (H) 01/23/2023   GLUCAP 137 (H) 01/16/2023   GLUCAP 86 01/14/2023   GLUCAP 67 (L) 01/14/2023     Exercise Target Goals: Exercise Program Goal: Individual exercise prescription set using results from initial 6 min walk test and THRR while considering  patient's activity barriers and safety.   Exercise Prescription Goal: Initial exercise prescription builds to 30-45 minutes a day of aerobic activity, 2-3 days per week.  Home exercise guidelines will be given to patient during program as part of exercise prescription that the participant will acknowledge.  Activity Barriers & Risk Stratification:  Activity Barriers & Cardiac Risk Stratification - 01/10/23 1500       Activity Barriers & Cardiac Risk Stratification   Activity Barriers Deconditioning;Back Problems    Cardiac Risk Stratification High             6 Minute Walk:  6 Minute Walk     Row Name 01/10/23 1455         6 Minute Walk   Phase Initial  CBG Pre 270 via personal CGM     Distance 1370 feet     Walk Time 6 minutes     # of Rest Breaks 0     MPH 2.6     METS 3.69     RPE 7     Perceived Dyspnea  0      VO2 Peak 12.9     Symptoms No     Resting HR 77 bpm     Resting BP 134/66     Resting Oxygen Saturation  98 %  Exercise Oxygen Saturation  during 6 min walk 99 %     Max Ex. HR 107 bpm     Max Ex. BP 144/72     2 Minute Post BP 138/68              Oxygen Initial Assessment:   Oxygen Re-Evaluation:   Oxygen Discharge (Final Oxygen Re-Evaluation):   Initial Exercise Prescription:  Initial Exercise Prescription - 01/10/23 1400       Date of Initial Exercise RX and Referring Provider   Date 01/10/23    Referring Provider Dr. Tonny Bollman MD    Expected Discharge Date 03/22/23      Treadmill   MPH 2.3    Grade 0    Minutes 15    METs 2.76      Elliptical   Level 1    Speed 1    Minutes 15    METs 3.2      Prescription Details   Frequency (times per week) 3    Duration Progress to 30 minutes of continuous aerobic without signs/symptoms of physical distress      Intensity   THRR 40-80% of Max Heartrate 67-134    Ratings of Perceived Exertion 11-13    Perceived Dyspnea 0-4      Progression   Progression Continue progressive overload as per policy without signs/symptoms or physical distress.      Resistance Training   Training Prescription Yes    Weight 3    Reps 10-15             Perform Capillary Blood Glucose checks as needed.  Exercise Prescription Changes:   Exercise Prescription Changes     Row Name 01/16/23 1628 01/30/23 1642 02/13/23 1701 03/04/23 1648 04/01/23 1648     Response to Exercise   Blood Pressure (Admit) 138/78 124/62 110/58 126/70 114/62   Blood Pressure (Exercise) 220/90 148/80 152/72 148/70 140/80   Blood Pressure (Exit) 124/68 104/60 98/60 102/66 94/64   Heart Rate (Admit) 94 bpm 90 bpm 94 bpm 86 bpm 78 bpm   Heart Rate (Exercise) 127 bpm 114 bpm 117 bpm 132 bpm 138 bpm   Heart Rate (Exit) 103 bpm 97 bpm 103 bpm 92 bpm 80 bpm   Rating of Perceived Exertion (Exercise) 7.5 7.5 9 11 14    Perceived Dyspnea  (Exercise) 0 0 0 0 0   Symptoms High BP with elliptical, d/c and resolved with rest. Recheck 128/78 -- -- -- --   Comments Pt first day on exercise in teh CRP2 program Reviewed MET's, goals and home ExRx REVD MET REVD MET's and goals REVD MET's and goals   Duration Progress to 30 minutes of  aerobic without signs/symptoms of physical distress Progress to 30 minutes of  aerobic without signs/symptoms of physical distress Progress to 30 minutes of  aerobic without signs/symptoms of physical distress Progress to 30 minutes of  aerobic without signs/symptoms of physical distress Progress to 30 minutes of  aerobic without signs/symptoms of physical distress   Intensity THRR unchanged THRR unchanged THRR unchanged THRR unchanged THRR unchanged     Progression   Progression Continue to progress workloads to maintain intensity without signs/symptoms of physical distress. Continue to progress workloads to maintain intensity without signs/symptoms of physical distress. Continue to progress workloads to maintain intensity without signs/symptoms of physical distress. Continue to progress workloads to maintain intensity without signs/symptoms of physical distress. Continue to progress workloads to maintain intensity without signs/symptoms of physical distress.  Average METs 2.91 2.9 3.55 4.37 5.4     Resistance Training   Training Prescription No No No Yes Yes   Weight -- -- -- 3 lbs 5 lbs   Reps -- -- -- 10-15 10-15   Time -- -- -- 10 Minutes 10 Minutes     Interval Training   Interval Training -- -- -- -- Yes   Equipment -- -- -- -- Treadmill   Comments -- -- -- -- 2 min walk, 1 min jog     Treadmill   MPH 2.5 -- -- 2.7  Patient stated her knee's were bothjering her, TM was easier for her 3.1  Interval walk 3.1 walk, 4.2 jog   Grade 0 0 -- 3.5 0   Minutes 15 15 -- 30 30   METs 2.91 2.91 -- 4.37 4.37  7.43 MET Jog     NuStep   Level -- 4 4 -- --   SPM -- 112 110 -- --   Minutes -- 15 15 -- --    METs -- 2.9 3.1 -- --     Recumbant Elliptical   Level -- -- 3 -- --   RPM -- -- 69 -- --   Watts -- -- 102 -- --   Minutes -- -- 15 -- --   METs -- -- 4 -- --     Elliptical   Level 1 -- -- -- --   Speed 1 -- -- -- --   Minutes 7  D/C after 7 mins due to high BP 220/90, resolved with rest. Recheck 128/78 -- -- -- --     Home Exercise Plan   Plans to continue exercise at -- Home (comment) Home (comment) Home (comment) Home (comment)   Frequency -- Add 2 additional days to program exercise sessions. Add 2 additional days to program exercise sessions. Add 2 additional days to program exercise sessions. Add 2 additional days to program exercise sessions.   Initial Home Exercises Provided -- 01/30/23 01/30/23 01/30/23 01/30/23            Exercise Comments:   Exercise Comments     Row Name 01/14/23 1627 01/16/23 1640 01/30/23 1648 02/13/23 1704 03/04/23 1653   Exercise Comments Pt first day in teh CRP2 program. Pt was unable to exercise today due to low blood sugar, but was able to chat with the RD. Will update exercise changes on patients next visit. Pt first day of exercise in the [facility] ICR program. Pt developed high BP on exercise equipment 220/90 on elliptical, resolved with rest to 128/78 and the treadmill 162/80, resolved with rest to 124/72. Pt was asymptomatic and very motivated to exercise, education given on high BP risk and will assess new equipment mode for next session until BP becomes more managed. REVD MET's, goals and home Exercise. Pt tolerated exercise well with an average MET level of 2.9. Pt is already exercising 2 days for 30-45 mins per session by playing games with her grandson and using her infinity hoop. Pt feeels good about her goals so far and is gaining stamina. She is also meeting regular with the RD who is helping with diet resources REVD MET's. Pt tolerated exercise well with an average MET level of 3.55. Pt is increasing her METs and has added in a second  mode of exercise with the [employer]. She is doing well with this change and feels good about her progress so far REVD MET's and goals. Pt tolerated exercise well with an average  MET level of 4.37. Pt feels good about her goals and is working closely with the RD, she is also increasing strength and stamina    Row Name 04/01/23 1653           Exercise Comments REVD MET's and goals. Pt tolerated exercise well with an average MET level of 5.4. Pt is doing well with her goals and is increasing stamina. She is enjoying her interval jogging and is continuing to work with the RD on diet modification. Overall doing very well                Exercise Goals and Review:   Exercise Goals     Row Name 01/10/23 1501             Exercise Goals   Increase Physical Activity Yes       Intervention Provide advice, education, support and counseling about physical activity/exercise needs.;Develop an individualized exercise prescription for aerobic and resistive training based on initial evaluation findings, risk stratification, comorbidities and participant's personal goals.       Expected Outcomes Short Term: Attend rehab on a regular basis to increase amount of physical activity.;Long Term: Exercising regularly at least 3-5 days a week.;Long Term: Add in home exercise to make exercise part of routine and to increase amount of physical activity.       Increase Strength and Stamina Yes       Intervention Provide advice, education, support and counseling about physical activity/exercise needs.;Develop an individualized exercise prescription for aerobic and resistive training based on initial evaluation findings, risk stratification, comorbidities and participant's personal goals.       Expected Outcomes Short Term: Increase workloads from initial exercise prescription for resistance, speed, and METs.;Short Term: Perform resistance training exercises routinely during rehab and add in resistance training at  home;Long Term: Improve cardiorespiratory fitness, muscular endurance and strength as measured by increased METs and functional capacity ( )       Able to understand and use rate of perceived exertion (RPE) scale Yes       Intervention Provide education and explanation on how to use RPE scale       Expected Outcomes Short Term: Able to use RPE daily in rehab to express subjective intensity level;Long Term:  Able to use RPE to guide intensity level when exercising independently       Knowledge and understanding of Target Heart Rate Range (THRR) Yes       Intervention Provide education and explanation of THRR including how the numbers were predicted and where they are located for reference       Expected Outcomes Short Term: Able to state/look up THRR;Long Term: Able to use THRR to govern intensity when exercising independently;Short Term: Able to use daily as guideline for intensity in rehab       Understanding of Exercise Prescription Yes       Intervention Provide education, explanation, and written materials on patient's individual exercise prescription       Expected Outcomes Short Term: Able to explain program exercise prescription;Long Term: Able to explain home exercise prescription to exercise independently                Exercise Goals Re-Evaluation :  Exercise Goals Re-Evaluation     Row Name 01/16/23 1633 01/30/23 1645 03/04/23 1651 04/01/23 1651       Exercise Goal Re-Evaluation   Exercise Goals Review Increase Physical Activity;Understanding of Exercise Prescription;Increase Strength and Stamina;Knowledge and understanding of Target Heart  Rate Range (THRR);Able to understand and use rate of perceived exertion (RPE) scale Increase Physical Activity;Understanding of Exercise Prescription;Increase Strength and Stamina;Knowledge and understanding of Target Heart Rate Range (THRR);Able to understand and use rate of perceived exertion (RPE) scale Increase Physical  Activity;Understanding of Exercise Prescription;Increase Strength and Stamina;Knowledge and understanding of Target Heart Rate Range (THRR);Able to understand and use rate of perceived exertion (RPE) scale Increase Physical Activity;Understanding of Exercise Prescription;Increase Strength and Stamina;Knowledge and understanding of Target Heart Rate Range (THRR);Able to understand and use rate of perceived exertion (RPE) scale    Comments Pt first day of exercise in the [facility] ICR program. Pt developed high BP on exercise equipment 220/90 on elliptical, resolved with rest to 128/78 and the treadmill 162/80, resolved with rest to 124/72. Pt was asymptomatic and very motivated to exercise, education given on high BP risk and will assess new equipment mode for next session until BP becomes more managed. REVD MET's, goals and home Exercise. Pt tolerated exercise well with an average MET level of 2.9. Pt is already exercising 2 days for 30-45 mins per session by playing games with her grandson and using her infinity hoop. Pt feeels good about her goals so far and is gaining stamina. She is also meeting regular with the RD who is helping with diet resources REVD MET's and goals. Pt tolerated exercise well with an average MET level of 4.37. Pt feels good about her goals and is working closely with the RD, she is also increasing strength and stamina REVD MET's and goals. Pt tolerated exercise well with an average MET level of 5.4. Pt is doing well with her goals and is increasing stamina. She is enjoying her interval jogging and is continuing to work with the RD on diet modification.    Expected Outcomes Will continue to monitor pt and assess new modes and intensities for exercise Will continue to monitor pt and assess new modes and intensities for exercise Will continue to monitor pt and assess new modes and intensities for exercise Will continue to monitor pt and assess new modes and intensities for exercise              Discharge Exercise Prescription (Final Exercise Prescription Changes):  Exercise Prescription Changes - 04/01/23 1648       Response to Exercise   Blood Pressure (Admit) 114/62    Blood Pressure (Exercise) 140/80    Blood Pressure (Exit) 94/64    Heart Rate (Admit) 78 bpm    Heart Rate (Exercise) 138 bpm    Heart Rate (Exit) 80 bpm    Rating of Perceived Exertion (Exercise) 14    Perceived Dyspnea (Exercise) 0    Comments REVD MET's and goals    Duration Progress to 30 minutes of  aerobic without signs/symptoms of physical distress    Intensity THRR unchanged      Progression   Progression Continue to progress workloads to maintain intensity without signs/symptoms of physical distress.    Average METs 5.4      Resistance Training   Training Prescription Yes    Weight 5 lbs    Reps 10-15    Time 10 Minutes      Interval Training   Interval Training Yes    Equipment Treadmill    Comments 2 min walk, 1 min jog      Treadmill   MPH 3.1   Interval walk 3.1 walk, 4.2 jog   Grade 0    Minutes 30  METs 4.37   7.43 MET Jog     Home Exercise Plan   Plans to continue exercise at Home (comment)    Frequency Add 2 additional days to program exercise sessions.    Initial Home Exercises Provided 01/30/23             Nutrition:  Target Goals: Understanding of nutrition guidelines, daily intake of sodium 1500mg , cholesterol 200mg , calories 30% from fat and 7% or less from saturated fats, daily to have 5 or more servings of fruits and vegetables.  Biometrics:  Pre Biometrics - 01/10/23 1453       Pre Biometrics   Waist Circumference 45 inches    Hip Circumference 47 inches    Waist to Hip Ratio 0.96 %    Triceps Skinfold 9 mm    % Body Fat 39.1 %    Grip Strength 38 kg    Flexibility 12.75 in    Single Leg Stand 24.7 seconds              Nutrition Therapy Plan and Nutrition Goals:  Nutrition Therapy & Goals - 03/21/23 0947       Nutrition  Therapy   Diet Heart Healthy/Carbohydrate Consistent Diet    Drug/Food Interactions Statins/Certain Fruits      Personal Nutrition Goals   Nutrition Goal Patient to identify strategies for reducing cardiovascular risk by attending the [facility] education and nutrition series weekly.   goal in progress.   Personal Goal #2 Patient to improve diet quality by using the plate method as a guide for meal planning to include lean protein/plant protein, fruits, vegetables, whole grains, nonfat dairy as part of a well-balanced diet.   goal in progress.   Personal Goal #3 Patient to improve blood sugar control with goal A1c <7%.   goal in progress.   Personal Goal #4 Patient to identify food sources and limit intake of saturated fat, trans fat, sodium, and refined carbohydrates.   goal in progress.   Comments Goals in progress. Evea continues to attend the [employer] and nutrition series regularly. She has started making many dietary changes and continues to work on transitioning to whole grains, increasing fruits/vegetables, reducing saturated fat. She continues to work on [employer] and sweet tea intake. She does eat out regularly but is making better choices. She has reduced carbohydrate intake at meals and is following a lower carbohydrate diet ~30-45g CHO/meal. She continues Mounjaro to aid with weight loss and blood sugar control.Her Evaristo Bury has been reduced to 15 units in the morning and novolog has been reduced and she stopped the glimepiride. She also has history of hypertriglyceridemia and pancreatitis; she continues vascepa and fenofibrate. Triglycerides have improved but remain elevated. She is down 7.5# since starting with our program. Patient will benefit from participation in intensive cardiac rehab for nutrition, exercise, and lifestyle modification.      Intervention Plan   Intervention Prescribe, educate and counsel regarding individualized specific dietary modifications  aiming towards targeted core components such as weight, hypertension, lipid management, diabetes, heart failure and other comorbidities.;Nutrition handout(s) given to patient.    Expected Outcomes Short Term Goal: Understand basic principles of dietary content, such as calories, fat, sodium, cholesterol and nutrients.;Long Term Goal: Adherence to prescribed nutrition plan.             Nutrition Assessments:  Nutrition Assessments - 03/07/23 1453       Rate Your Plate Scores   Pre Score 47  MEDIFICTS Score Key: >=70 Need to make dietary changes  40-70 Heart Healthy Diet <= 40 Therapeutic Level Cholesterol Diet   Flowsheet Row INTENSIVE CARDIAC REHAB from 03/06/2023 in [facility]  Picture Your Plate Total Score on Admission 47      Picture Your Plate Scores: <57 Unhealthy dietary pattern with much room for improvement. 41-50 Dietary pattern unlikely to meet recommendations for good health and room for improvement. 51-60 More healthful dietary pattern, with some room for improvement.  >60 Healthy dietary pattern, although there may be some specific behaviors that could be improved.    Nutrition Goals Re-Evaluation:  Nutrition Goals Re-Evaluation     Row Name 01/15/23 1439 02/15/23 1424 03/21/23 0947         Goals   Current Weight 212 lb 11.9 oz (96.5 kg) 208 lb 15.9 oz (94.8 kg) 205 lb 4 oz (93.1 kg)  weight from last attended session on 8/30     Comment A1c 10.8,  LDL WNL, triglycerides 569 (taking fenofibrate, Vascepa), HDL 23, lipoproteinA WNL no new labs; most recent labs A1c 10.8, LDL WNL, triglycerides 569 (taking fenofibrate, Vascepa), HDL 23, lipoproteinA WNL triglycerides improved to 286, HDL 24; other most recent labs A1c 10.8, LDL WNL, lipoprotein A WNL     Expected Outcome Dennielle has started to make some dietary changes including reduced sodium intake and reduced reguar [facility] to 3  daily. She did start Mounjaro to aid with weight loss and blood sugar control two weeks ago. She continues sliding scale Novolog and Tresiba 54 units twice daily. She also has history of hypertriglyceridemia and pancreatitis. Patient will benefit from participation in intensive cardiac rehab for nutrition, exercise, and lifestyle modification. Goals in progress. Tariana continues to attend the [employer] and nutrition series regularly. She has started making many dietary changes and continues to work on transitioning to whole grains, increasing fruits/vegetables, reducing saturated fat. She continues to work on reducing [employer] and sweet tea intake. She has reduced carbohydrate intake at meals. She continues Mounjaro to aid with weight loss and blood sugar control.Her Evaristo Bury has been reduced to 15 units in the morning and novolog has been reduced and she stopped the glimepiride. She also has history of hypertriglyceridemia and pancreatitis; she continues vascepa and fenofibrate. She is down 3.7# since starting with our program. Patient will benefit from participation in intensive cardiac rehab for nutrition, exercise, and lifestyle modification. Goals in progress. Muriah continues to attend the [employer] and nutrition series regularly. She has started making many dietary changes and continues to work on transitioning to whole grains, increasing fruits/vegetables, reducing saturated fat. She continues to work on [employer] and sweet tea intake. She does eat out regularly but is making better choices. She has reduced carbohydrate intake at meals and is following a lower carbohydrate diet ~30-45g CHO/meal. She continues Mounjaro to aid with weight loss and blood sugar control.Her Evaristo Bury has been reduced to 15 units in the morning and novolog has been reduced and she stopped the glimepiride. She also has history of hypertriglyceridemia and pancreatitis; she continues vascepa and  fenofibrate. Triglycerides have improved but remain elevated. She is down 7.5# since starting with our program. Patient will benefit from participation in intensive cardiac rehab for nutrition, exercise, and lifestyle modification.              Nutrition Goals Re-Evaluation:  Nutrition Goals Re-Evaluation     Row Name 01/15/23  1439 02/15/23 1424 03/21/23 0947         Goals   Current Weight 212 lb 11.9 oz (96.5 kg) 208 lb 15.9 oz (94.8 kg) 205 lb 4 oz (93.1 kg)  weight from last attended session on 8/30     Comment A1c 10.8,  LDL WNL, triglycerides 569 (taking fenofibrate, Vascepa), HDL 23, lipoproteinA WNL no new labs; most recent labs A1c 10.8, LDL WNL, triglycerides 569 (taking fenofibrate, Vascepa), HDL 23, lipoproteinA WNL triglycerides improved to 286, HDL 24; other most recent labs A1c 10.8, LDL WNL, lipoprotein A WNL     Expected Outcome Meily has started to make some dietary changes including reduced sodium intake and reduced reguar [facility] to 3 daily. She did start Mounjaro to aid with weight loss and blood sugar control two weeks ago. She continues sliding scale Novolog and Tresiba 54 units twice daily. She also has history of hypertriglyceridemia and pancreatitis. Patient will benefit from participation in intensive cardiac rehab for nutrition, exercise, and lifestyle modification. Goals in progress. Cande continues to attend the [employer] and nutrition series regularly. She has started making many dietary changes and continues to work on transitioning to whole grains, increasing fruits/vegetables, reducing saturated fat. She continues to work on reducing [employer] and sweet tea intake. She has reduced carbohydrate intake at meals. She continues Mounjaro to aid with weight loss and blood sugar control.Her Evaristo Bury has been reduced to 15 units in the morning and novolog has been reduced and she stopped the glimepiride. She also has history of hypertriglyceridemia and  pancreatitis; she continues vascepa and fenofibrate. She is down 3.7# since starting with our program. Patient will benefit from participation in intensive cardiac rehab for nutrition, exercise, and lifestyle modification. Goals in progress. Maeley continues to attend the [employer] and nutrition series regularly. She has started making many dietary changes and continues to work on transitioning to whole grains, increasing fruits/vegetables, reducing saturated fat. She continues to work on [employer] and sweet tea intake. She does eat out regularly but is making better choices. She has reduced carbohydrate intake at meals and is following a lower carbohydrate diet ~30-45g CHO/meal. She continues Mounjaro to aid with weight loss and blood sugar control.Her Evaristo Bury has been reduced to 15 units in the morning and novolog has been reduced and she stopped the glimepiride. She also has history of hypertriglyceridemia and pancreatitis; she continues vascepa and fenofibrate. Triglycerides have improved but remain elevated. She is down 7.5# since starting with our program. Patient will benefit from participation in intensive cardiac rehab for nutrition, exercise, and lifestyle modification.              Nutrition Goals Discharge (Final Nutrition Goals Re-Evaluation):  Nutrition Goals Re-Evaluation - 03/21/23 0947       Goals   Current Weight 205 lb 4 oz (93.1 kg)   weight from last attended session on 8/30   Comment triglycerides improved to 286, HDL 24; other most recent labs A1c 10.8, LDL WNL, lipoprotein A WNL    Expected Outcome Goals in progress. Shandrea continues to attend the [employer] and nutrition series regularly. She has started making many dietary changes and continues to work on transitioning to whole grains, increasing fruits/vegetables, reducing saturated fat. She continues to work on [employer] and sweet tea intake. She does eat out regularly but is  making better choices. She has reduced carbohydrate intake at meals and is following a lower carbohydrate diet ~30-45g CHO/meal. She  continues Mounjaro to aid with weight loss and blood sugar control.Her Evaristo Bury has been reduced to 15 units in the morning and novolog has been reduced and she stopped the glimepiride. She also has history of hypertriglyceridemia and pancreatitis; she continues vascepa and fenofibrate. Triglycerides have improved but remain elevated. She is down 7.5# since starting with our program. Patient will benefit from participation in intensive cardiac rehab for nutrition, exercise, and lifestyle modification.             Psychosocial: Target Goals: Acknowledge presence or absence of significant depression and/or stress, maximize coping skills, provide positive support system. Participant is able to verbalize types and ability to use techniques and skills needed for reducing stress and depression.  Initial Review & Psychosocial Screening:  Initial Psych Review & Screening - 01/10/23 1530       Initial Review   Current issues with Current Stress Concerns    Source of Stress Concerns Chronic Illness;Occupation    Comments Khadeejah reports having stress due to her recent hospitalizaiton due to MI/ STENT      Family Dynamics   Good Support System? Yes   Starleen has her husband and daughter and coworkers for support     Barriers   Psychosocial barriers to participate in program The patient should benefit from training in stress management and relaxation.      Screening Interventions   Interventions Encouraged to exercise;To provide support and resources with identified psychosocial needs;Provide feedback about the scores to participant    Expected Outcomes Long Term Goal: Stressors or current issues are controlled or eliminated.;Long Term goal: The participant improves quality of Life and PHQ9 Scores as seen by post scores and/or verbalization of changes              Quality of Life Scores:  Quality of Life - 04/05/23 1628       Quality of Life   Select Quality of Life      Quality of Life Scores   Health/Function Post 23.77 %    Socioeconomic Post 21.07 %    Psych/Spiritual Post 23.36 %    Family Post 18.8 %    GLOBAL Post 22.4 %            Scores of 19 and below usually indicate a poorer quality of life in these areas.  A difference of  2-3 points is a clinically meaningful difference.  A difference of 2-3 points in the total score of the Quality of Life Index has been associated with significant improvement in overall quality of life, self-image, physical symptoms, and general health in studies assessing change in quality of life.  PHQ-9: Review Flowsheet  More data exists      01/22/2023 01/10/2023 08/17/2021 04/14/2020 08/22/2018  Depression screen PHQ 2/9  Decreased Interest 0 0 0 0 0  Down, Depressed, Hopeless 0 0 0 0 0  PHQ - 2 Score 0 0 0 0 0  Altered sleeping 1 1 - - 0  Tired, decreased energy 1 1 - - 0  Change in appetite 1 1 - - 0  Feeling bad or failure about yourself  0 - - - 0  Trouble concentrating 0 0 - - 0  Moving slowly or fidgety/restless 0 0 - - 0  Suicidal thoughts 0 0 - - 0  PHQ-9 Score 3 3 - - 0  Difficult doing work/chores Not difficult at all Not difficult at all - - -    Details  Interpretation of Total Score  Total Score Depression Severity:  1-4 = Minimal depression, 5-9 = Mild depression, 10-14 = Moderate depression, 15-19 = Moderately severe depression, 20-27 = Severe depression   Psychosocial Evaluation and Intervention:   Psychosocial Re-Evaluation:  Psychosocial Re-Evaluation     Row Name 02/13/23 1343 03/12/23 1345 04/10/23 0944         Psychosocial Re-Evaluation   Current issues with Current Stress Concerns None Identified None Identified     Comments Kayann continues to have concerns regarding her health related to diabetes. Briar recently had an outpatient procedure Jassel  has not voiced any increased concerns or stressors during exercise at cardiac rehab Anamae has not voiced any increased concerns or stressors during exercise at cardiac rehab     Expected Outcomes Lonna will have decreased or controlled  stress upon completion of cardiac rehab Rey says cardiac rehab has been helpful. Shaquia will complete cardiac rehab on 03/25/23 Correy says cardiac rehab has been helpful. Winsley will complete cardiac rehab on 04/12/23     Interventions Stress management education;Encouraged to attend Cardiac Rehabilitation for the exercise;Relaxation education Stress management education;Encouraged to attend Cardiac Rehabilitation for the exercise;Relaxation education Stress management education;Encouraged to attend Cardiac Rehabilitation for the exercise;Relaxation education     Continue Psychosocial Services  Follow up required by staff No Follow up required No Follow up required       Initial Review   Source of Stress Concerns Chronic Illness Chronic Illness Chronic Illness     Comments will continue to monitor and offer support as needed will continue to monitor and offer support as needed will continue to monitor and offer support as needed              Psychosocial Discharge (Final Psychosocial Re-Evaluation):  Psychosocial Re-Evaluation - 04/10/23 0944       Psychosocial Re-Evaluation   Current issues with None Identified    Comments Alechia has not voiced any increased concerns or stressors during exercise at cardiac rehab    Expected Outcomes Blessen says cardiac rehab has been helpful. Naylanie will complete cardiac rehab on 04/12/23    Interventions Stress management education;Encouraged to attend Cardiac Rehabilitation for the exercise;Relaxation education    Continue Psychosocial Services  No Follow up required      Initial Review   Source of Stress Concerns Chronic Illness    Comments will continue to monitor and offer support as needed              Vocational Rehabilitation: Provide vocational rehab assistance to qualifying candidates.   Vocational Rehab Evaluation & Intervention:  Vocational Rehab - 01/10/23 1532       Initial Vocational Rehab Evaluation & Intervention   Assessment shows need for Vocational Rehabilitation No   Jenely works full time remotely and does not need vocational rehab at this time            Education: Education Goals: Education classes will be provided on a weekly basis, covering required topics. Participant will state understanding/return demonstration of topics presented.    Education     Row Name 01/14/23 1500     Education   Cardiac Education Topics Pritikin   Nurse, children's   Educator Dietitian   Select Nutrition   Nutrition Calorie Density   Instruction Review Code 1- Verbalizes Understanding   Class Start Time 1400   Class Stop Time 1445   Class Time Calculation (min) 45 min  Row Name 01/16/23 1600     Education   Cardiac Education Topics Pritikin   Environmental education officer - Meals in a Snap   Instruction Review Code 1- Verbalizes Understanding   Class Start Time 1400   Class Stop Time 1445   Class Time Calculation (min) 45 min    Row Name 01/23/23 1600     Education   Cardiac Education Topics Pritikin   Customer service manager   Weekly Topic One-Pot Wonders   Instruction Review Code 1- Verbalizes Understanding   Class Start Time 1400   Class Stop Time 1440   Class Time Calculation (min) 40 min    Row Name 01/25/23 1500     Education   Cardiac Education Topics Pritikin   Hospital doctor Education   General Education Hypertension and Heart Disease   Instruction Review Code 1- Verbalizes Understanding   Class Start Time 1400   Class Stop Time 1450    Class Time Calculation (min) 50 min    Row Name 01/30/23 1600     Education   Cardiac Education Topics Pritikin   Customer service manager   Weekly Topic Comforting Weekend Breakfasts   Instruction Review Code 1- Verbalizes Understanding   Class Start Time 1400   Class Stop Time 1445   Class Time Calculation (min) 45 min    Row Name 02/06/23 1500     Education   Cardiac Education Topics Pritikin   Customer service manager   Weekly Topic Fast Evening Meals   Instruction Review Code 1- Verbalizes Understanding   Class Start Time 1400   Class Stop Time 1440   Class Time Calculation (min) 40 min    Row Name 02/08/23 1500     Education   Cardiac Education Topics Pritikin   Licensed conveyancer Nutrition   Nutrition Vitamins and Minerals   Instruction Review Code 1- Verbalizes Understanding   Class Start Time 1400   Class Stop Time 1445   Class Time Calculation (min) 45 min    Row Name 02/11/23 1600     Education   Cardiac Education Topics Pritikin   Geographical information systems officer Psychosocial   Psychosocial Workshop Healthy Sleep for a Healthy Heart   Instruction Review Code 1- Verbalizes Understanding   Class Start Time 1400   Class Stop Time 1455   Class Time Calculation (min) 55 min    Row Name 02/13/23 1600     Education   Cardiac Education Topics Pritikin   Customer service manager   Weekly Topic International Cuisine- Spotlight on the [employer] Zones   Instruction Review Code 1- Verbalizes Understanding   Class Start Time 1400   Class Stop Time 1448   Class Time Calculation (min) 48 min    Row Name 02/15/23 1200     Education   Cardiac Education Topics Pritikin   Nurse, children's Exercise Physiologist  Select  Exercise Education   Exercise Education Improving Performance   Instruction Review Code 1- Verbalizes Understanding   Class Start Time 1150   Class Stop Time 1230   Class Time Calculation (min) 40 min    Row Name 02/18/23 1500     Education   Cardiac Education Topics Pritikin   Glass blower/designer Nutrition   Nutrition Workshop Fueling a Forensic psychologist   Instruction Review Code 1- Tax inspector   Class Start Time 1400   Class Stop Time 1452   Class Time Calculation (min) 52 min    Row Name 02/20/23 1500     Education   Cardiac Education Topics Pritikin   Customer service manager   Weekly Topic Simple Sides and Sauces   Instruction Review Code 1- Verbalizes Understanding   Class Start Time 1400   Class Stop Time 1445   Class Time Calculation (min) 45 min    Row Name 02/22/23 1400     Education   Cardiac Education Topics Pritikin   Nurse, children's Exercise Physiologist   Select Psychosocial   Psychosocial How Our Thoughts Can Heal Our Hearts   Instruction Review Code 1- Verbalizes Understanding   Class Start Time 1400   Class Stop Time 1450   Class Time Calculation (min) 50 min    Row Name 03/04/23 1500     Education   Cardiac Education Topics Pritikin   Geographical information systems officer Psychosocial   Psychosocial Workshop From Head to Heart: The Power of a Healthy Outlook   Instruction Review Code 1- Verbalizes Understanding   Class Start Time 1400   Class Stop Time 1446   Class Time Calculation (min) 46 min    Row Name 03/06/23 1500     Education   Cardiac Education Topics Pritikin   Customer service manager   Weekly Topic Adding Flavor - Sodium-Free   Instruction Review Code 1- Verbalizes Understanding   Class Start Time 1400   Class Stop Time 1437    Class Time Calculation (min) 37 min    Row Name 03/08/23 1400     Education   Cardiac Education Topics Pritikin   Hospital doctor Education   General Education Heart Disease Risk Reduction   Instruction Review Code 1- Verbalizes Understanding   Class Start Time 1355   Class Stop Time 1436   Class Time Calculation (min) 41 min    Row Name 03/11/23 1500     Education   Cardiac Education Topics Pritikin   Licensed conveyancer Nutrition   Nutrition Overview of the Pritikin Eating Plan   Instruction Review Code 1- Verbalizes Understanding   Class Start Time 1400   Class Stop Time 1452   Class Time Calculation (min) 52 min    Row Name 03/13/23 1500     Education   Cardiac Education Topics Pritikin   Customer service manager   Weekly Topic Fast and Healthy Breakfasts   Instruction Review Code 1- [employer] Understanding  Class Start Time 1400   Class Stop Time 1445   Class Time Calculation (min) 45 min    Row Name 03/15/23 1400     Education   Cardiac Education Topics Pritikin   Nurse, children's Exercise Physiologist   Select Psychosocial   Psychosocial Healthy Minds, Bodies, Hearts   Instruction Review Code 1- Verbalizes Understanding   Class Start Time 1402   Class Stop Time 1445   Class Time Calculation (min) 43 min    Row Name 03/22/23 1500     Education   Cardiac Education Topics Pritikin   Select Workshops     Workshops   Educator Exercise Physiologist   Select Exercise   Exercise Workshop Location manager and Fall Prevention   Instruction Review Code 1- Verbalizes Understanding   Class Start Time 1405   Class Stop Time 1455   Class Time Calculation (min) 50 min    Row Name 04/01/23 1500     Education   Cardiac Education Topics Pritikin   Building surveyor Psychosocial   Psychosocial Workshop Recognizing and Reducing Stress   Instruction Review Code 1- Verbalizes Understanding   Class Start Time 1400   Class Stop Time 1447   Class Time Calculation (min) 47 min    Row Name 04/03/23 1500     Education   Cardiac Education Topics Pritikin   Orthoptist   Educator Dietitian   Weekly Topic Tasty Appetizers and Snacks   Instruction Review Code 1- Verbalizes Understanding   Class Start Time 1355   Class Stop Time 1433   Class Time Calculation (min) 38 min    Row Name 04/05/23 1400     Education   Cardiac Education Topics Pritikin   Nurse, children's Exercise Physiologist   Select Nutrition   Nutrition Calorie Density   Instruction Review Code 1- Verbalizes Understanding   Class Start Time 1358   Class Stop Time 1432   Class Time Calculation (min) 34 min            Core Videos: Exercise    Move It!  Clinical staff conducted group or individual video education with verbal and written material and guidebook.  Patient learns the recommended Pritikin exercise program. Exercise with the goal of living a long, healthy life. Some of the health benefits of exercise include controlled diabetes, healthier blood pressure levels, improved cholesterol levels, improved heart and lung capacity, improved sleep, and better body composition. Everyone should speak with their doctor before starting or changing an exercise routine.  Biomechanical Limitations Clinical staff conducted group or individual video education with verbal and written material and guidebook.  Patient learns how biomechanical limitations can impact exercise and how we can mitigate and possibly overcome limitations to have an impactful and balanced exercise routine.  Body Composition Clinical staff conducted group or individual video education with verbal and  written material and guidebook.  Patient learns that body composition (ratio of muscle mass to fat mass) is a key component to assessing overall fitness, rather than body weight alone. Increased fat mass, especially visceral belly fat, can put [place] at increased risk for metabolic syndrome, type 2 diabetes, heart disease, and even death. It is recommended to combine diet and exercise (cardiovascular and resistance training) to improve your body  composition. Seek guidance from your physician and exercise physiologist before implementing an exercise routine.  Exercise Action Plan Clinical staff conducted group or individual video education with verbal and written material and guidebook.  Patient learns the recommended strategies to achieve and enjoy long-term exercise adherence, including variety, self-motivation, self-efficacy, and positive decision making. Benefits of exercise include fitness, good health, weight management, more energy, better sleep, less stress, and overall well-being.  Medical   Heart Disease Risk Reduction Clinical staff conducted group or individual video education with verbal and written material and guidebook.  Patient learns our heart is our most vital organ as it circulates oxygen, nutrients, white blood cells, and hormones throughout the entire body, and carries waste away. Data supports a plant-based eating plan like the Pritikin Program for its effectiveness in slowing progression of and reversing heart disease. The video provides a number of recommendations to address heart disease.   Metabolic Syndrome and Belly Fat  Clinical staff conducted group or individual video education with verbal and written material and guidebook.  Patient learns what metabolic syndrome is, how it leads to heart disease, and how one can reverse it and keep it from coming back. You have metabolic syndrome if you have 3 of the following 5 criteria: abdominal obesity, high blood pressure, high  triglycerides, low HDL cholesterol, and high blood sugar.  Hypertension and Heart Disease Clinical staff conducted group or individual video education with verbal and written material and guidebook.  Patient learns that high blood pressure, or hypertension, is very common in the [place]. Hypertension is largely due to excessive salt intake, but other important risk factors include being overweight, physical inactivity, drinking too much alcohol, smoking, and not eating enough potassium from fruits and vegetables. High blood pressure is a leading risk factor for heart attack, stroke, congestive heart failure, dementia, kidney failure, and premature death. Long-term effects of excessive salt intake include stiffening of the arteries and thickening of heart muscle and organ damage. Recommendations include ways to reduce hypertension and the risk of heart disease.  Diseases of Our Time - Focusing on Diabetes Clinical staff conducted group or individual video education with verbal and written material and guidebook.  Patient learns why the best way to stop diseases of our time is prevention, through food and other lifestyle changes. Medicine (such as prescription pills and surgeries) is often only a Band-Aid on the problem, not a long-term solution. Most common diseases of our time include obesity, type 2 diabetes, hypertension, heart disease, and cancer. The Pritikin Program is recommended and has been proven to help reduce, reverse, and/or prevent the damaging effects of metabolic syndrome.  Nutrition   Overview of the Pritikin Eating Plan  Clinical staff conducted group or individual video education with verbal and written material and guidebook.  Patient learns about the Pritikin Eating Plan for disease risk reduction. The Pritikin Eating Plan emphasizes a wide variety of unrefined, minimally-processed carbohydrates, like fruits, vegetables, whole grains, and legumes. Go, Caution, and Stop food  choices are explained. Plant-based and lean animal proteins are emphasized. Rationale provided for low sodium intake for blood pressure control, low added sugars for blood sugar stabilization, and low added fats and oils for coronary artery disease risk reduction and weight management.  Calorie Density  Clinical staff conducted group or individual video education with verbal and written material and guidebook.  Patient learns about calorie density and how it impacts the Pritikin Eating Plan. Knowing the characteristics of the food you choose will help  you decide whether those foods will lead to weight gain or weight loss, and whether you want to consume more or less of them. Weight loss is usually a side effect of the Pritikin Eating Plan because of its focus on low calorie-dense foods.  Label Reading  Clinical staff conducted group or individual video education with verbal and written material and guidebook.  Patient learns about the Pritikin recommended label reading guidelines and corresponding recommendations regarding calorie density, added sugars, sodium content, and whole grains.  Dining Out - Part 1  Clinical staff conducted group or individual video education with verbal and written material and guidebook.  Patient learns that restaurant meals can be sabotaging because they can be so high in calories, fat, sodium, and/or sugar. Patient learns recommended strategies on how to positively address this and avoid unhealthy pitfalls.  Facts on Fats  Clinical staff conducted group or individual video education with verbal and written material and guidebook.  Patient learns that lifestyle modifications can be just as effective, if not more so, as many medications for lowering your risk of heart disease. A Pritikin lifestyle can help to reduce your risk of inflammation and atherosclerosis (cholesterol build-up, or plaque, in the artery walls). Lifestyle interventions such as dietary choices and  physical activity address the cause of atherosclerosis. A review of the types of fats and their impact on blood cholesterol levels, along with dietary recommendations to reduce fat intake is also included.  Nutrition Action Plan  Clinical staff conducted group or individual video education with verbal and written material and guidebook.  Patient learns how to incorporate Pritikin recommendations into their lifestyle. Recommendations include planning and keeping personal health goals in mind as an important part of their success.  Healthy Mind-Set    Healthy Minds, Bodies, Hearts  Clinical staff conducted group or individual video education with verbal and written material and guidebook.  Patient learns how to identify when they are stressed. Video will discuss the impact of that stress, as well as the many benefits of stress management. Patient will also be introduced to stress management techniques. The way we think, act, and feel has an impact on our hearts.  How Our Thoughts Can Heal Our Hearts  Clinical staff conducted group or individual video education with verbal and written material and guidebook.  Patient learns that negative thoughts can cause depression and anxiety. This can result in negative lifestyle behavior and serious health problems. Cognitive behavioral therapy is an effective method to help control our thoughts in order to change and improve our emotional outlook.  Additional Videos:  Exercise    Improving Performance  Clinical staff conducted group or individual video education with verbal and written material and guidebook.  Patient learns to use a non-linear approach by alternating intensity levels and lengths of time spent exercising to help burn more calories and lose more body fat. Cardiovascular exercise helps improve heart health, metabolism, hormonal balance, blood sugar control, and recovery from fatigue. Resistance training improves strength, endurance, balance,  coordination, reaction time, metabolism, and muscle mass. Flexibility exercise improves circulation, posture, and balance. Seek guidance from your physician and exercise physiologist before implementing an exercise routine and learn your capabilities and proper form for all exercise.  Introduction to Yoga  Clinical staff conducted group or individual video education with verbal and written material and guidebook.  Patient learns about yoga, a discipline of the coming together of mind, breath, and body. The benefits of yoga include improved flexibility, improved range of motion,  better posture and core strength, increased lung function, weight loss, and positive self-image. Yoga's heart health benefits include lowered blood pressure, healthier heart rate, decreased cholesterol and triglyceride levels, improved immune function, and reduced stress. Seek guidance from your physician and exercise physiologist before implementing an exercise routine and learn your capabilities and proper form for all exercise.  Medical   Aging: Enhancing Your Quality of Life  Clinical staff conducted group or individual video education with verbal and written material and guidebook.  Patient learns key strategies and recommendations to stay in good physical health and enhance quality of life, such as prevention strategies, having an advocate, securing a Health Care Proxy and Power of Attorney, and keeping a list of medications and system for tracking them. It also discusses how to avoid risk for bone loss.  Biology of Weight Control  Clinical staff conducted group or individual video education with verbal and written material and guidebook.  Patient learns that weight gain occurs because we consume more calories than we burn (eating more, moving less). Even if your body weight is normal, you may have higher ratios of fat compared to muscle mass. Too much body fat puts you at increased risk for cardiovascular disease, heart  attack, stroke, type 2 diabetes, and obesity-related cancers. In addition to exercise, following the Pritikin Eating Plan can help reduce your risk.  Decoding Lab Results  Clinical staff conducted group or individual video education with verbal and written material and guidebook.  Patient learns that lab test reflects one measurement whose values change over time and are influenced by many factors, including medication, stress, sleep, exercise, food, hydration, pre-existing medical conditions, and more. It is recommended to use the knowledge from this video to become more involved with your lab results and evaluate your numbers to speak with your doctor.   Diseases of Our Time - Overview  Clinical staff conducted group or individual video education with verbal and written material and guidebook.  Patient learns that according to the CDC, 50% to 70% of chronic diseases (such as obesity, type 2 diabetes, elevated lipids, hypertension, and heart disease) are avoidable through lifestyle improvements including healthier food choices, listening to satiety cues, and increased physical activity.  Sleep Disorders Clinical staff conducted group or individual video education with verbal and written material and guidebook.  Patient learns how good quality and duration of sleep are important to overall health and well-being. Patient also learns about sleep disorders and how they impact health along with recommendations to address them, including discussing with a physician.  Nutrition  Dining Out - Part 2 Clinical staff conducted group or individual video education with verbal and written material and guidebook.  Patient learns how to plan ahead and communicate in order to maximize their dining experience in a healthy and nutritious manner. Included are recommended food choices based on the type of restaurant the patient is visiting.   Fueling a Banker conducted group or individual  video education with verbal and written material and guidebook.  There is a strong connection between our food choices and our health. Diseases like obesity and type 2 diabetes are very prevalent and are in large-part due to lifestyle choices. The Pritikin Eating Plan provides plenty of food and hunger-curbing satisfaction. It is easy to follow, affordable, and helps reduce health risks.  Menu Workshop  Clinical staff conducted group or individual video education with verbal and written material and guidebook.  Patient learns that restaurant meals can sabotage health  goals because they are often packed with calories, fat, sodium, and sugar. Recommendations include strategies to plan ahead and to communicate with the manager, chef, or server to help order a healthier meal.  Planning Your Eating Strategy  Clinical staff conducted group or individual video education with verbal and written material and guidebook.  Patient learns about the Pritikin Eating Plan and its benefit of reducing the risk of disease. The Pritikin Eating Plan does not focus on calories. Instead, it emphasizes high-quality, nutrient-rich foods. By knowing the characteristics of the foods, we choose, we can determine their calorie density and make informed decisions.  Targeting Your Nutrition Priorities  Clinical staff conducted group or individual video education with verbal and written material and guidebook.  Patient learns that lifestyle habits have a tremendous impact on disease risk and progression. This video provides eating and physical activity recommendations based on your personal health goals, such as reducing LDL cholesterol, losing weight, preventing or controlling type 2 diabetes, and reducing high blood pressure.  Vitamins and Minerals  Clinical staff conducted group or individual video education with verbal and written material and guidebook.  Patient learns different ways to obtain key vitamins and minerals,  including through a recommended healthy diet. It is important to discuss all supplements you take with your doctor.   Healthy Mind-Set    Smoking Cessation  Clinical staff conducted group or individual video education with verbal and written material and guidebook.  Patient learns that cigarette smoking and tobacco addiction pose a serious health risk which affects millions of people. Stopping smoking will significantly reduce the risk of heart disease, lung disease, and many forms of cancer. Recommended strategies for quitting are covered, including working with your doctor to develop a successful plan.  Culinary   Becoming a Set designer conducted group or individual video education with verbal and written material and guidebook.  Patient learns that cooking at home can be healthy, cost-effective, quick, and puts them in control. Keys to cooking healthy recipes will include looking at your recipe, assessing your equipment needs, planning ahead, making it simple, choosing cost-effective seasonal ingredients, and limiting the use of added fats, salts, and sugars.  Cooking - Breakfast and Snacks  Clinical staff conducted group or individual video education with verbal and written material and guidebook.  Patient learns how important breakfast is to satiety and nutrition through the entire day. Recommendations include key foods to eat during breakfast to help stabilize blood sugar levels and to prevent overeating at meals later in the day. Planning ahead is also a key component.  Cooking - Educational psychologist conducted group or individual video education with verbal and written material and guidebook.  Patient learns eating strategies to improve overall health, including an approach to cook more at home. Recommendations include thinking of animal protein as a side on your plate rather than center stage and focusing instead on lower calorie dense options like vegetables,  fruits, whole grains, and plant-based proteins, such as beans. Making sauces in large quantities to freeze for later and leaving the skin on your vegetables are also recommended to maximize your experience.  Cooking - Healthy Salads and Dressing Clinical staff conducted group or individual video education with verbal and written material and guidebook.  Patient learns that vegetables, fruits, whole grains, and legumes are the foundations of the Pritikin Eating Plan. Recommendations include how to incorporate each of these in flavorful and healthy salads, and how to create homemade salad  dressings. Proper handling of ingredients is also covered. Cooking - Soups and [employer] - Soups and Desserts Clinical staff conducted group or individual video education with verbal and written material and guidebook.  Patient learns that Pritikin soups and desserts make for easy, nutritious, and delicious snacks and meal components that are low in sodium, fat, sugar, and calorie density, while high in vitamins, minerals, and filling fiber. Recommendations include simple and healthy ideas for soups and desserts.   Overview     The Pritikin Solution Program Overview Clinical staff conducted group or individual video education with verbal and written material and guidebook.  Patient learns that the results of the Pritikin Program have been documented in more than 100 articles published in peer-reviewed journals, and the benefits include reducing risk factors for (and, in some cases, even reversing) high cholesterol, high blood pressure, type 2 diabetes, obesity, and more! An overview of the three key pillars of the Pritikin Program will be covered: eating well, doing regular exercise, and having a healthy mind-set.  WORKSHOPS  Exercise: Exercise Basics: Building Your Action Plan Clinical staff led group instruction and group discussion with PowerPoint presentation and patient guidebook. To enhance the  learning environment the use of posters, models and videos may be added. At the conclusion of this workshop, patients will comprehend the difference between physical activity and exercise, as well as the benefits of incorporating both, into their routine. Patients will understand the FITT (Frequency, Intensity, Time, and Type) principle and how to use it to build an exercise action plan. In addition, safety concerns and other considerations for exercise and cardiac rehab will be addressed by the presenter. The purpose of this lesson is to promote a comprehensive and effective weekly exercise routine in order to improve patients' overall level of fitness.   Managing Heart Disease: Your Path to a Healthier Heart Clinical staff led group instruction and group discussion with PowerPoint presentation and patient guidebook. To enhance the learning environment the use of posters, models and videos may be added.At the conclusion of this workshop, patients will understand the anatomy and physiology of the heart. Additionally, they will understand how Pritikin's three pillars impact the risk factors, the progression, and the management of heart disease.  The purpose of this lesson is to provide a high-level overview of the heart, heart disease, and how the Pritikin lifestyle positively impacts risk factors.  Exercise Biomechanics Clinical staff led group instruction and group discussion with PowerPoint presentation and patient guidebook. To enhance the learning environment the use of posters, models and videos may be added. Patients will learn how the structural parts of their bodies function and how these functions impact their daily activities, movement, and exercise. Patients will learn how to promote a neutral spine, learn how to manage pain, and identify ways to improve their physical movement in order to promote healthy living. The purpose of this lesson is to expose patients to common  physical limitations that impact physical activity. Participants will learn practical ways to adapt and manage aches and pains, and to minimize their effect on regular exercise. Patients will learn how to maintain good posture while sitting, walking, and lifting.  Balance Training and Fall Prevention  Clinical staff led group instruction and group discussion with PowerPoint presentation and patient guidebook. To enhance the learning environment the use of posters, models and videos may be added. At the conclusion of this workshop, patients will understand the importance of their sensorimotor skills (vision, proprioception, and the vestibular system)  in maintaining their ability to balance as they age. Patients will apply a variety of balancing exercises that are appropriate for their current level of function. Patients will understand the common causes for poor balance, possible solutions to these problems, and ways to modify their physical environment in order to minimize their fall risk. The purpose of this lesson is to teach patients about the importance of maintaining balance as they age and ways to minimize their risk of falling.  WORKSHOPS   Nutrition:  Fueling a Ship broker led group instruction and group discussion with PowerPoint presentation and patient guidebook. To enhance the learning environment the use of posters, models and videos may be added. Patients will review the foundational principles of the Pritikin Eating Plan and understand what constitutes a serving size in each of the food groups. Patients will also learn Pritikin-friendly foods that are better choices when away from home and review make-ahead meal and snack options. Calorie density will be reviewed and applied to three nutrition priorities: weight maintenance, weight loss, and weight gain. The purpose of this lesson is to reinforce (in a group setting) the key concepts around what patients are  recommended to eat and how to apply these guidelines when away from home by planning and selecting Pritikin-friendly options. Patients will understand how calorie density may be adjusted for different weight management goals.  Mindful Eating  Clinical staff led group instruction and group discussion with PowerPoint presentation and patient guidebook. To enhance the learning environment the use of posters, models and videos may be added. Patients will briefly review the concepts of the Pritikin Eating Plan and the importance of low-calorie dense foods. The concept of mindful eating will be introduced as well as the importance of paying attention to internal hunger signals. Triggers for non-hunger eating and techniques for dealing with triggers will be explored. The purpose of this lesson is to provide patients with the opportunity to review the basic principles of the Pritikin Eating Plan, discuss the value of eating mindfully and how to measure internal cues of hunger and fullness using the Hunger Scale. Patients will also discuss reasons for non-hunger eating and learn strategies to use for controlling emotional eating.  Targeting Your Nutrition Priorities Clinical staff led group instruction and group discussion with PowerPoint presentation and patient guidebook. To enhance the learning environment the use of posters, models and videos may be added. Patients will learn how to determine their genetic susceptibility to disease by reviewing their family history. Patients will gain insight into the importance of diet as part of an overall healthy lifestyle in mitigating the impact of genetics and other environmental insults. The purpose of this lesson is to provide patients with the opportunity to assess their personal nutrition priorities by looking at their family history, their own health history and current risk factors. Patients will also be able to discuss ways of prioritizing and modifying the Pritikin  Eating Plan for their highest risk areas  Menu  Clinical staff led group instruction and group discussion with PowerPoint presentation and patient guidebook. To enhance the learning environment the use of posters, models and videos may be added. Using menus brought in from [employer], or printed from [employer], patients will apply the Pritikin dining out guidelines that were presented in the [employer] video. Patients will also be able to practice these guidelines in a variety of provided scenarios. The purpose of this lesson is to provide patients with the opportunity to practice hands-on  learning of the [employer] guidelines with actual menus and practice scenarios.  Label Reading Clinical staff led group instruction and group discussion with PowerPoint presentation and patient guidebook. To enhance the learning environment the use of posters, models and videos may be added. Patients will review and discuss the Pritikin label reading guidelines presented in Pritikin's Label Reading Educational series video. Using fool labels brought in from local grocery stores and markets, patients will apply the label reading guidelines and determine if the packaged food meet the Pritikin guidelines. The purpose of this lesson is to provide patients with the opportunity to review, discuss, and practice hands-on learning of the Pritikin Label Reading guidelines with actual packaged food labels. Cooking School  Pritikin's [employer] are designed to teach patients ways to prepare quick, simple, and affordable recipes at home. The importance of nutrition's role in chronic disease risk reduction is reflected in its emphasis in the overall Pritikin program. By learning how to prepare essential core Pritikin Eating Plan recipes, patients will increase control over what they eat; be able to customize the flavor of foods without the use of added salt, sugar, or fat; and  improve the quality of the food they consume. By learning a set of core recipes which are easily assembled, quickly prepared, and affordable, patients are more likely to prepare more healthy foods at home. These workshops focus on convenient breakfasts, simple entres, side dishes, and desserts which can be prepared with minimal effort and are consistent with nutrition recommendations for cardiovascular risk reduction. Cooking [employer] are taught by a Armed forces logistics/support/administrative officer (RD) who has been trained by the [employer]. The chef or RD has a clear understanding of the importance of minimizing - if not completely eliminating - added fat, sugar, and sodium in recipes. Throughout the series of Cooking School Workshop sessions, patients will learn about healthy ingredients and efficient methods of cooking to build confidence in their capability to prepare    Cooking School weekly topics:  Adding Flavor- Sodium-Free  Fast and Healthy Breakfasts  Powerhouse Plant-Based Proteins  Satisfying Salads and Dressings  Simple Sides and Sauces  International Cuisine-Spotlight on the [employer] Zones  Delicious Desserts  Savory Soups  [employer] - Meals in a Astronomer Appetizers and Snacks  Comforting Weekend Breakfasts  One-Pot Wonders   Fast Evening Meals  Landscape architect Your Pritikin Plate  WORKSHOPS   Healthy Mindset (Psychosocial):  Focused Goals, Sustainable Changes Clinical staff led group instruction and group discussion with PowerPoint presentation and patient guidebook. To enhance the learning environment the use of posters, models and videos may be added. Patients will be able to apply effective goal setting strategies to establish at least one personal goal, and then take consistent, meaningful action toward that goal. They will learn to identify common barriers to achieving personal goals and develop strategies to overcome them. Patients will  also gain an understanding of how our mind-set can impact our ability to achieve goals and the importance of cultivating a positive and growth-oriented mind-set. The purpose of this lesson is to provide patients with a deeper understanding of how to set and achieve personal goals, as well as the tools and strategies needed to overcome common obstacles which may arise along the way.  From Head to Heart: The Power of a Healthy Outlook  Clinical staff led group instruction and group discussion with PowerPoint presentation and patient guidebook. To enhance the learning environment the use of  posters, models and videos may be added. Patients will be able to recognize and describe the impact of emotions and mood on physical health. They will discover the importance of self-care and explore self-care practices which may work for them. Patients will also learn how to utilize the 4 C's to cultivate a healthier outlook and better manage stress and challenges. The purpose of this lesson is to demonstrate to patients how a healthy outlook is an essential part of maintaining good health, especially as they continue their cardiac rehab journey.  Healthy Sleep for a Healthy Heart Clinical staff led group instruction and group discussion with PowerPoint presentation and patient guidebook. To enhance the learning environment the use of posters, models and videos may be added. At the conclusion of this workshop, patients will be able to demonstrate knowledge of the importance of sleep to overall health, well-being, and quality of life. They will understand the symptoms of, and treatments for, common sleep disorders. Patients will also be able to identify daytime and nighttime behaviors which impact sleep, and they will be able to apply these tools to help manage sleep-related challenges. The purpose of this lesson is to provide patients with a general overview of sleep and outline the importance of quality sleep. Patients will  learn about a few of the most common sleep disorders. Patients will also be introduced to the concept of "sleep hygiene," and discover ways to self-manage certain sleeping problems through simple daily behavior changes. Finally, the workshop will motivate patients by clarifying the links between quality sleep and their goals of heart-healthy living.   Recognizing and Reducing Stress Clinical staff led group instruction and group discussion with PowerPoint presentation and patient guidebook. To enhance the learning environment the use of posters, models and videos may be added. At the conclusion of this workshop, patients will be able to understand the types of stress reactions, differentiate between acute and chronic stress, and recognize the impact that chronic stress has on their health. They will also be able to apply different coping mechanisms, such as reframing negative self-talk. Patients will have the opportunity to practice a variety of stress management techniques, such as deep abdominal breathing, progressive muscle relaxation, and/or guided imagery.  The purpose of this lesson is to educate patients on the role of stress in their lives and to provide healthy techniques for coping with it.  Learning Barriers/Preferences:  Learning Barriers/Preferences - 01/10/23 1503       Learning Barriers/Preferences   Learning Barriers None    Learning Preferences Group Instruction;Individual Instruction;Skilled Demonstration             Education Topics:  Knowledge Questionnaire Score:  Knowledge Questionnaire Score - 04/05/23 1623       Knowledge Questionnaire Score   Post Score 24/24             Core Components/Risk Factors/Patient Goals at Admission:  Personal Goals and Risk Factors at Admission - 01/10/23 1504       Core Components/Risk Factors/Patient Goals on Admission    Weight Management Yes;Obesity;Weight Loss    Intervention Weight Management: Develop a combined  nutrition and exercise program designed to reach desired caloric intake, while maintaining appropriate intake of nutrient and fiber, sodium and fats, and appropriate energy expenditure required for the weight goal.;Weight Management: Provide education and appropriate resources to help participant work on and attain dietary goals.;Weight Management/Obesity: Establish reasonable short term and long term weight goals.;Obesity: Provide education and appropriate resources to help participant work on and  attain dietary goals.    Admit Weight 212 lb 11.9 oz (96.5 kg)    Expected Outcomes Short Term: Continue to assess and modify interventions until short term weight is achieved;Long Term: Adherence to nutrition and physical activity/exercise program aimed toward attainment of established weight goal;Weight Loss: Understanding of general recommendations for a balanced deficit meal plan, which promotes 1-2 lb weight loss per week and includes a negative energy balance of [phone] kcal/d;Understanding recommendations for meals to include 15-35% energy as protein, 25-35% energy from fat, 35-60% energy from carbohydrates, less than 200mg  of dietary cholesterol, 20-35 gm of total fiber daily;Understanding of distribution of calorie intake throughout the day with the consumption of 4-5 meals/snacks    Tobacco Cessation Yes    Number of packs per day 1/2 PPD   Referral faxed to [place] [facility]. Patient says she is ready to quit and has cut back   Intervention Assist the participant in steps to quit. Provide individualized education and counseling about committing to Tobacco Cessation, relapse prevention, and pharmacological support that can be provided by physician.;Education officer, environmental, assist with locating and accessing local/national Quit Smoking programs, and support quit date choice.    Expected Outcomes Short Term: Will demonstrate readiness to quit, by selecting a quit date.;Short Term: Will quit all tobacco  product use, adhering to prevention of relapse plan.;Long Term: Complete abstinence from all tobacco products for at least 12 months from quit date.    Diabetes Yes    Intervention Provide education about signs/symptoms and action to take for hypo/hyperglycemia.;Provide education about proper nutrition, including hydration, and aerobic/resistive exercise prescription along with prescribed medications to achieve blood glucose in normal ranges: Fasting glucose 65-99 mg/dL    Expected Outcomes Short Term: Participant verbalizes understanding of the signs/symptoms and immediate care of hyper/hypoglycemia, proper foot care and importance of medication, aerobic/resistive exercise and nutrition plan for blood glucose control.;Long Term: Attainment of HbA1C < 7%.    Hypertension Yes    Intervention Monitor prescription use compliance.;Provide education on lifestyle modifcations including regular physical activity/exercise, weight management, moderate sodium restriction and increased consumption of fresh fruit, vegetables, and low fat dairy, alcohol moderation, and smoking cessation.    Expected Outcomes Short Term: Continued assessment and intervention until BP is < 140/5mm HG in hypertensive participants. < 130/78mm HG in hypertensive participants with diabetes, heart failure or chronic kidney disease.;Long Term: Maintenance of blood pressure at goal levels.    Lipids Yes    Intervention Provide education and support for participant on nutrition & aerobic/resistive exercise along with prescribed medications to achieve LDL 70mg , HDL >40mg .    Expected Outcomes Short Term: Participant states understanding of desired cholesterol values and is compliant with medications prescribed. Participant is following exercise prescription and nutrition guidelines.;Long Term: Cholesterol controlled with medications as prescribed, with individualized exercise RX and with personalized nutrition plan. Value goals: LDL < 70mg , HDL  > 40 mg.    Stress Yes    Intervention Offer individual and/or small group education and counseling on adjustment to heart disease, stress management and health-related lifestyle change. Teach and support self-help strategies.;Refer participants experiencing significant psychosocial distress to appropriate mental health specialists for further evaluation and treatment. When possible, include family members and significant others in education/counseling sessions.    Expected Outcomes Short Term: Participant demonstrates changes in health-related behavior, relaxation and other stress management skills, ability to obtain effective social support, and compliance with psychotropic medications if prescribed.;Long Term: Emotional wellbeing is indicated by absence of clinically significant psychosocial  distress or social isolation.    Personal Goal Other Yes    Personal Goal Short term:modification with soda drinking Long term: stamina wt loss (60 lbs)    Intervention Will continue to monitor pt and progress workloads as tolerated without sign or symptom    Expected Outcomes Pt will achieve her goals             Core Components/Risk Factors/Patient Goals Review:   Goals and Risk Factor Review     Row Name 02/13/23 1348 03/12/23 1348 04/10/23 0945         Core Components/Risk Factors/Patient Goals Review   Personal Goals Review Weight Management/Obesity;Lipids;Diabetes;Stress;Hypertension Weight Management/Obesity;Lipids;Diabetes;Stress;Hypertension Weight Management/Obesity;Lipids;Diabetes;Stress;Hypertension     Review Chiann has been doing well with exercise. Vital signs and CBG's have been stable. occasional exertional BP elevations have been noted. Hisako says she is trying to cut back on smoking cigarettes. Lil has been doing well with exercise. Vital signs and CBG's have been stable. Princes says she is trying to cut back on smoking cigarettes. Damaris is now jogging intermitently on the treadmill  and has lost 1.6 kg since starting cardiac rehab. Hatsuko will complete cardiac rehab on 03/25/23 Jalayla has been doing well with exercise. Vital signs and CBG's have been stable. Amaranta says she is trying to cut back on smoking cigarettes. Easter is now jogging intermitently on the treadmill and has lost 2.7 kg since starting cardiac rehab. Makirah will complete cardiac rehab on 04/12/23. Triglycerides have improved.     Expected Outcomes Cattaleya will continue to participate in cardiac rehab for exercise, nutrition and lifestyle modifications Melyna will continue to participate in cardiac rehab for exercise, nutrition and lifestyle modifications Nylene will continue to participate in cardiac rehab for exercise, nutrition and lifestyle modifications              Core Components/Risk Factors/Patient Goals at Discharge (Final Review):   Goals and Risk Factor Review - 04/10/23 0945       Core Components/Risk Factors/Patient Goals Review   Personal Goals Review Weight Management/Obesity;Lipids;Diabetes;Stress;Hypertension    Review Shraddha has been doing well with exercise. Vital signs and CBG's have been stable. Pennie says she is trying to cut back on smoking cigarettes. Yaricza is now jogging intermitently on the treadmill and has lost 2.7 kg since starting cardiac rehab. Brendalynn will complete cardiac rehab on 04/12/23. Triglycerides have improved.    Expected Outcomes Cyndle will continue to participate in cardiac rehab for exercise, nutrition and lifestyle modifications             ITP Comments:  ITP Comments     Row Name 01/10/23 1323 01/14/23 1706 02/13/23 1339 03/12/23 1344 04/10/23 0944   ITP Comments Armanda Magic, MD:  Medical Director.  Introduction to the [facility] / [facility]ensive [facility]iac [facility].  Initial orientation packet reviewed with the patient. 30 Day ITP Review. Keaunna did not start exercise at cardiac rehab on 01/14/23 due to hypoglycemia. Plans to return to exercise on Wednesday.  30 Day ITP Review. Colee has good attendance and participation in exercise at cardiac rehab 30 Day ITP Review. Jinnifer continues to have  good attendance and participation in exercise at cardiac rehab. Chenea will complete cardiac rehab on 03/25/23 30 Day ITP Review. Vallon continues to have  good attendance and participation in exercise at cardiac rehab. Haliegh will complete cardiac rehab on 04/12/23            Comments: See ITP comments.Thayer Headings RN BSN

## 2023-04-12 ENCOUNTER — Encounter (HOSPITAL_COMMUNITY)
Admission: RE | Admit: 2023-04-12 | Discharge: 2023-04-12 | Disposition: A | Discharge status: Home / Self Care | Payer: BC Managed Care – PPO | Source: Ambulatory Visit | Attending: Cardiovascular Disease

## 2023-04-12 DIAGNOSIS — E162 Hypoglycemia, unspecified: Secondary | ICD-10-CM

## 2023-04-12 DIAGNOSIS — I2111 ST elevation (STEMI) myocardial infarction involving right coronary artery: Secondary | ICD-10-CM | POA: Diagnosis not present

## 2023-04-12 DIAGNOSIS — Z955 Presence of coronary angioplasty implant and graft: Secondary | ICD-10-CM

## 2023-04-12 DIAGNOSIS — I252 Old myocardial infarction: Secondary | ICD-10-CM | POA: Diagnosis not present

## 2023-04-12 DIAGNOSIS — Z48812 Encounter for surgical aftercare following surgery on the circulatory system: Secondary | ICD-10-CM | POA: Diagnosis not present

## 2023-04-23 DIAGNOSIS — B078 Other viral warts: Secondary | ICD-10-CM | POA: Diagnosis not present

## 2023-04-23 DIAGNOSIS — D485 Neoplasm of uncertain behavior of skin: Secondary | ICD-10-CM | POA: Diagnosis not present

## 2023-04-23 NOTE — Progress Notes (Signed)
Discharge Progress Report  Patient Details  Name: Teresa Parks MRN: [id number] Date of Birth: February 28, 1971 Referring Provider:   Flowsheet Row INTENSIVE CARDIAC REHAB ORIENT from 01/10/2023 in [facility]  Referring Provider Dr. Tonny Bollman MD        Number of Visits: 50  Reason for Discharge:  Patient reached a stable level of exercise. Patient independent in their exercise. Patient has Teresa Parks program and personal goals.  Smoking History:  Social History   Tobacco Use  Smoking Status Every Day   Current packs/day: 0.50   Average packs/day: 0.5 packs/day for 36.8 years (18.4 ttl pk-yrs)   Types: Cigarettes   Start date: 1988  Smokeless Tobacco Never  Tobacco Comments   01/10/23 Referral faxed to [place] QUIT[facility]    Diagnosis:  12/21/22 ST elevation myocardial infarction involving right coronary artery (HCC)  12/21/22 S/P DES Mid RCA  S/P drug eluting coronary stent placement  Hypoglycemia  ADL UCSD:   Initial Exercise Prescription:  Initial Exercise Prescription - 01/10/23 1400       Date of Initial Exercise RX and Referring Provider   Date 01/10/23    Referring Provider Dr. Tonny Bollman MD    Expected Discharge Date 03/22/23      Treadmill   MPH 2.3    Grade 0    Minutes 15    METs 2.76      Elliptical   Level 1    Speed 1    Minutes 15    METs 3.2      Prescription Details   Frequency (times per week) 3    Duration Progress to 30 minutes of continuous aerobic without signs/symptoms of physical distress      Intensity   THRR 40-80% of Max Heartrate 67-134    Ratings of Perceived Exertion 11-13    Perceived Dyspnea 0-4      Progression   Progression Continue progressive overload as per policy without signs/symptoms or physical distress.      Resistance Training   Training Prescription Yes    Weight 3    Reps 10-15             Discharge Exercise Prescription (Final Exercise  Prescription Changes):  Exercise Prescription Changes - 04/12/23 1609       Response to Exercise   Blood Pressure (Admit) 112/72    Blood Pressure (Exercise) 154/82    Blood Pressure (Exit) 106/64    Heart Rate (Admit) 87 bpm    Heart Rate (Exercise) 125 bpm    Heart Rate (Exit) 100 bpm    Rating of Perceived Exertion (Exercise) 13    Perceived Dyspnea (Exercise) 0    Comments Pt graduated the [employer] program    Duration Progress to 30 minutes of  aerobic without signs/symptoms of physical distress    Intensity THRR unchanged      Progression   Progression Continue to progress workloads to maintain intensity without signs/symptoms of physical distress.    Average METs 5.39      Resistance Training   Training Prescription Yes    Weight 5 lbs    Reps 10-15    Time 10 Minutes      Interval Training   Interval Training Yes    Equipment Treadmill    Comments 2 min walk, 1 min jog      Treadmill   MPH 3.3   Interval walk 3.3 walk, 4.2 jog   Grade  0    Minutes 30    METs 3.53   7.43 Teresa Parks Jog     Home Exercise Plan   Plans to continue exercise at Home (comment)    Frequency Add 2 additional days to program exercise sessions.    Initial Home Exercises Provided 01/30/23             Functional Capacity:  6 Minute Walk     Row Name 01/10/23 1455 04/10/23 1628       6 Minute Walk   Phase Initial  CBG Pre 270 via personal CGM Discharge    Distance 1370 feet 1745 feet    Distance % Change -- 27.37 %    Distance Feet Change -- 375 ft    Walk Time 6 minutes 6 minutes    # of Rest Breaks 0 0    MPH 2.6 3.3    METS 3.69 4.49    RPE 7 11    Perceived Dyspnea  0 0    VO2 Peak 12.9 15.72    Symptoms No No    Resting HR 77 bpm 86 bpm    Resting BP 134/66 118/72    Resting Oxygen Saturation  98 % --    Exercise Oxygen Saturation  during 6 min walk 99 % --    Max Ex. HR 107 bpm 116 bpm    Max Ex. BP 144/72 142/74    2 Minute Post BP 138/68 122/68              Psychological, QOL, Others - Outcomes: PHQ 2/9:    04/12/2023    2:59 PM 01/22/2023    3:54 PM 01/10/2023    1:24 PM 08/17/2021   10:01 AM 04/14/2020   10:58 AM  Depression screen PHQ 2/9  Decreased Interest 0 0 0 0 0  Down, Depressed, Hopeless 0 0 0 0 0  PHQ - 2 Score 0 0 0 0 0  Altered sleeping 0 1 1    Tired, decreased energy 0 1 1    Change in appetite 0 1 1    Feeling bad or failure about yourself  0 0     Trouble concentrating 0 0 0    Moving slowly or fidgety/restless 0 0 0    Suicidal thoughts 0 0 0    PHQ-9 Score 0 3 3    Difficult doing work/chores Not difficult at all Not difficult at all Not difficult at all      Quality of Life:  Quality of Life - 04/05/23 1628       Quality of Life   Select Quality of Life      Quality of Life Scores   Health/Function Post 23.77 %    Socioeconomic Post 21.07 %    Psych/Spiritual Post 23.36 %    Family Post 18.8 %    GLOBAL Post 22.4 %             Personal Goals: Goals established at orientation with interventions provided to work toward goal.  Personal Goals and Risk Factors at Admission - 01/10/23 1504       Core Components/Risk Factors/Patient Goals on Admission    Weight Management Yes;Obesity;Weight Loss    Intervention Weight Management: Develop a combined nutrition and exercise program designed to reach desired caloric intake, while maintaining appropriate intake of nutrient and fiber, sodium and fats, and appropriate energy expenditure required for the weight goal.;Weight Management: Provide education and appropriate resources to help participant  work on and attain dietary goals.;Weight Management/Obesity: Establish reasonable short term and long term weight goals.;Obesity: Provide education and appropriate resources to help participant work on and attain dietary goals.    Admit Weight 212 lb 11.9 oz (96.5 kg)    Expected Outcomes Short Term: Continue to assess and modify interventions until short term  weight is achieved;Long Term: Adherence to nutrition and physical activity/exercise program aimed toward attainment of established weight goal;Weight Loss: Understanding of general recommendations for a balanced deficit meal plan, which promotes 1-2 lb weight loss per week and includes a negative energy balance of [phone] kcal/d;Understanding recommendations for meals to include 15-35% energy as protein, 25-35% energy from fat, 35-60% energy from carbohydrates, less than 200mg  of dietary cholesterol, 20-35 gm of total fiber daily;Understanding of distribution of calorie intake throughout the day with the consumption of 4-5 meals/snacks    Tobacco Cessation Yes    Number of packs per day 1/2 PPD   Referral faxed to [facility] Quitline. Patient says she is ready to quit and has cut back   Intervention Assist the participant in steps to quit. Provide individualized education and counseling about committing to Tobacco Cessation, relapse prevention, and pharmacological support that can be provided by physician.;Education officer, environmental, assist with locating and accessing local/national Quit Smoking programs, and support quit date choice.    Expected Outcomes Short Term: Will demonstrate readiness to quit, by selecting a quit date.;Short Term: Will quit all tobacco product use, adhering to prevention of relapse plan.;Long Term: Complete abstinence from all tobacco products for at least 12 months from quit date.    Diabetes Yes    Intervention Provide education about signs/symptoms and action to take for hypo/hyperglycemia.;Provide education about proper nutrition, including hydration, and aerobic/resistive exercise prescription along with prescribed medications to achieve blood glucose in normal ranges: Fasting glucose 65-99 mg/dL    Expected Outcomes Short Term: Participant verbalizes understanding of the signs/symptoms and immediate care of hyper/hypoglycemia, proper foot care and importance of medication,  aerobic/resistive exercise and nutrition plan for blood glucose control.;Long Term: Attainment of HbA1C < 7%.    Hypertension Yes    Intervention Monitor prescription use compliance.;Provide education on lifestyle modifcations including regular physical activity/exercise, weight management, moderate sodium restriction and increased consumption of fresh fruit, vegetables, and low fat dairy, alcohol moderation, and smoking cessation.    Expected Outcomes Short Term: Continued assessment and intervention until BP is < 140/36mm HG in hypertensive participants. < 130/9mm HG in hypertensive participants with diabetes, heart failure or chronic kidney disease.;Long Term: Maintenance of blood pressure at goal levels.    Lipids Yes    Intervention Provide education and support for participant on nutrition & aerobic/resistive exercise along with prescribed medications to achieve LDL 70mg , HDL >40mg .    Expected Outcomes Short Term: Participant states understanding of desired cholesterol values and is compliant with medications prescribed. Participant is following exercise prescription and nutrition guidelines.;Long Term: Cholesterol controlled with medications as prescribed, with individualized exercise RX and with personalized nutrition plan. Value goals: LDL < 70mg , HDL > 40 mg.    Stress Yes    Intervention Offer individual and/or small group education and counseling on adjustment to heart disease, stress management and health-related lifestyle change. Teach and support self-help strategies.;Refer participants experiencing significant psychosocial distress to appropriate mental health specialists for further evaluation and treatment. When possible, include family members and significant others in education/counseling sessions.    Expected Outcomes Short Term: Participant demonstrates changes in health-related behavior, relaxation and other  stress management skills, ability to obtain effective social support, and  compliance with psychotropic medications if prescribed.;Long Term: Emotional wellbeing is indicated by absence of clinically significant psychosocial distress or social isolation.    Personal Goal Other Yes    Personal Goal Short term:modification with soda drinking Long term: stamina wt loss (60 lbs)    Intervention Will continue to monitor pt and progress workloads as tolerated without sign or symptom    Expected Outcomes Pt will achieve her goals              Personal Goals Discharge:  Goals and Risk Factor Review     Row Name 02/13/23 1348 03/12/23 1348 04/10/23 0945         Core Components/Risk Factors/Patient Goals Review   Personal Goals Review Weight Management/Obesity;Lipids;Diabetes;Stress;Hypertension Weight Management/Obesity;Lipids;Diabetes;Stress;Hypertension Weight Management/Obesity;Lipids;Diabetes;Stress;Hypertension     Review Teresa Parks has been doing well with exercise. Vital signs and CBG's have been stable. occasional exertional BP elevations have been noted. Teresa Parks says she is trying to cut back on smoking cigarettes. Teresa Parks has been doing well with exercise. Vital signs and CBG's have been stable. Teresa Parks says she is trying to cut back on smoking cigarettes. Teresa Parks is now jogging intermitently on the treadmill and has lost 1.6 kg since starting cardiac rehab. Teresa Parks will complete cardiac rehab on 03/25/23 Teresa Parks has been doing well with exercise. Vital signs and CBG's have been stable. Teresa Parks says she is trying to cut back on smoking cigarettes. Teresa Parks is now jogging intermitently on the treadmill and has lost 2.7 kg since starting cardiac rehab. Teresa Parks will complete cardiac rehab on 04/12/23. Triglycerides have improved.     Expected Outcomes Teresa Parks will continue to participate in cardiac rehab for exercise, nutrition and lifestyle modifications Teresa Parks will continue to participate in cardiac rehab for exercise, nutrition and lifestyle modifications Teresa Parks will continue to participate in  cardiac rehab for exercise, nutrition and lifestyle modifications              Exercise Goals and Review:  Exercise Goals     Row Name 01/10/23 1501             Exercise Goals   Increase Physical Activity Yes       Intervention Provide advice, education, support and counseling about physical activity/exercise needs.;Develop an individualized exercise prescription for aerobic and resistive training based on initial evaluation findings, risk stratification, comorbidities and participant's personal goals.       Expected Outcomes Short Term: Attend rehab on a regular basis to increase amount of physical activity.;Long Term: Exercising regularly at least 3-5 days a week.;Long Term: Add in home exercise to make exercise part of routine and to increase amount of physical activity.       Increase Strength and Stamina Yes       Intervention Provide advice, education, support and counseling about physical activity/exercise needs.;Develop an individualized exercise prescription for aerobic and resistive training based on initial evaluation findings, risk stratification, comorbidities and participant's personal goals.       Expected Outcomes Short Term: Increase workloads from initial exercise prescription for resistance, speed, and METs.;Short Term: Perform resistance training exercises routinely during rehab and add in resistance training at home;Long Term: Improve cardiorespiratory fitness, muscular endurance and strength as measured by increased METs and functional capacity ( )       Able to understand and use rate of perceived exertion (RPE) scale Yes       Intervention Provide education and explanation on how to use RPE  scale       Expected Outcomes Short Term: Able to use RPE daily in rehab to express subjective intensity level;Long Term:  Able to use RPE to guide intensity level when exercising independently       Knowledge and understanding of Target Heart Rate Range (THRR) Yes        Intervention Provide education and explanation of THRR including how the numbers were predicted and where they are located for reference       Expected Outcomes Short Term: Able to state/look up THRR;Long Term: Able to use THRR to govern intensity when exercising independently;Short Term: Able to use daily as guideline for intensity in rehab       Understanding of Exercise Prescription Yes       Intervention Provide education, explanation, and written materials on patient's individual exercise prescription       Expected Outcomes Short Term: Able to explain program exercise prescription;Long Term: Able to explain home exercise prescription to exercise independently                Exercise Goals Re-Evaluation:  Exercise Goals Re-Evaluation     Row Name 01/16/23 1633 01/30/23 1645 03/04/23 1651 04/01/23 1651 04/12/23 1612     Exercise Goal Re-Evaluation   Exercise Goals Review Increase Physical Activity;Understanding of Exercise Prescription;Increase Strength and Stamina;Knowledge and understanding of Target Heart Rate Range (THRR);Able to understand and use rate of perceived exertion (RPE) scale Increase Physical Activity;Understanding of Exercise Prescription;Increase Strength and Stamina;Knowledge and understanding of Target Heart Rate Range (THRR);Able to understand and use rate of perceived exertion (RPE) scale Increase Physical Activity;Understanding of Exercise Prescription;Increase Strength and Stamina;Knowledge and understanding of Target Heart Rate Range (THRR);Able to understand and use rate of perceived exertion (RPE) scale Increase Physical Activity;Understanding of Exercise Prescription;Increase Strength and Stamina;Knowledge and understanding of Target Heart Rate Range (THRR);Able to understand and use rate of perceived exertion (RPE) scale Increase Physical Activity;Understanding of Exercise Prescription;Increase Strength and Stamina;Knowledge and understanding of Target Heart Rate  Range (THRR);Able to understand and use rate of perceived exertion (RPE) scale   Comments Pt first day of exercise in the [facility] program. Pt developed high BP on exercise equipment 220/90 on elliptical, resolved with rest to 128/78 and the treadmill 162/80, resolved with rest to 124/72. Pt was asymptomatic and very motivated to exercise, education given on high BP risk and will assess new equipment mode for next session until BP becomes more managed. REVD Teresa Parks's, goals and home Exercise. Pt tolerated exercise well with an average Teresa Parks level of 2.9. Pt is already exercising 2 days for 30-45 mins per session by playing games with her grandson and using her infinity hoop. Pt feeels good about her goals so far and is gaining stamina. She is also meeting regular with the RD who is helping with diet resources REVD Teresa Parks's and goals. Pt tolerated exercise well with an average Teresa Parks level of 4.37. Pt feels good about her goals and is working closely with the RD, she is also increasing strength and stamina REVD Teresa Parks's and goals. Pt tolerated exercise well with an average Teresa Parks level of 5.4. Pt is doing well with her goals and is increasing stamina. She is enjoying her interval jogging and is continuing to work with the RD on diet modification. Pt graduated the [employer]. Pt tolerated exercise well with an average Teresa Parks level of 5.39. Pt feels good about her progress and is doing well. She improved on her post by 39ft  for a total of 1779ft. She will continue to exercise by walking, using her infinity hoop and playing with her grandson, She is also joining the [employer], either the [employer] or just joining the [employer]. She will let me know if she needs a referral. She palns to exercise 5-7 days for 30-45 mins per session.   Expected Outcomes Will continue to monitor pt and assess new modes and intensities for exercise Will continue to monitor pt and assess new modes and intensities for exercise Will continue to  monitor pt and assess new modes and intensities for exercise Will continue to monitor pt and assess new modes and intensities for exercise Pt will continue to exercise on her own and gain strength            Nutrition & Weight - Outcomes:  Pre Biometrics - 01/10/23 1453       Pre Biometrics   Waist Circumference 45 inches    Hip Circumference 47 inches    Waist to Hip Ratio 0.96 %    Triceps Skinfold 9 mm    % Body Fat 39.1 %    Grip Strength 38 kg    Flexibility 12.75 in    Single Leg Stand 24.7 seconds             Post Biometrics - 04/10/23 1630        Post  Biometrics   Height 5' 5.5" (1.664 m)    Weight 92.8 kg    Waist Circumference 46 inches    Hip Circumference 44 inches    Waist to Hip Ratio 1.05 %    BMI (Calculated) 33.52    Triceps Skinfold 6 mm    % Body Fat 36.8 %    Grip Strength 38 kg    Flexibility 14 in    Single Leg Stand 30 seconds             Nutrition:  Nutrition Therapy & Goals - 03/21/23 0947       Nutrition Therapy   Diet Heart Healthy/Carbohydrate Consistent Diet    Drug/Food Interactions Statins/Certain Fruits      Personal Nutrition Goals   Nutrition Goal Patient to identify strategies for reducing cardiovascular risk by attending the [facility] education and nutrition series weekly.   goal in progress.   Personal Goal #2 Patient to improve diet quality by using the plate method as a guide for meal planning to include lean protein/plant protein, fruits, vegetables, whole grains, nonfat dairy as part of a well-balanced diet.   goal in progress.   Personal Goal #3 Patient to improve blood sugar control with goal A1c <7%.   goal in progress.   Personal Goal #4 Patient to identify food sources and limit intake of saturated fat, trans fat, sodium, and refined carbohydrates.   goal in progress.   Comments Goals in progress. Teresa Parks continues to attend the [employer] and nutrition series regularly. She has started making many  dietary changes and continues to work on transitioning to whole grains, increasing fruits/vegetables, reducing saturated fat. She continues to work on [employer] and sweet tea intake. She does eat out regularly but is making better choices. She has reduced carbohydrate intake at meals and is following a lower carbohydrate diet ~30-45g CHO/meal. She continues Mounjaro to aid with weight loss and blood sugar control.Her Teresa Parks has been reduced to 15 units in the morning and novolog has been reduced and she stopped the glimepiride. She also has history of  hypertriglyceridemia and pancreatitis; she continues vascepa and fenofibrate. Triglycerides have improved but remain elevated. She is down 7.5# since starting with our program. Patient will benefit from participation in intensive cardiac rehab for nutrition, exercise, and lifestyle modification.      Intervention Plan   Intervention Prescribe, educate and counsel regarding individualized specific dietary modifications aiming towards targeted core components such as weight, hypertension, lipid management, diabetes, heart failure and other comorbidities.;Nutrition handout(s) given to patient.    Expected Outcomes Short Term Goal: Understand basic principles of dietary content, such as calories, fat, sodium, cholesterol and nutrients.;Long Term Goal: Adherence to prescribed nutrition plan.             Nutrition Discharge:  Nutrition Assessments - 04/12/23 1544       Rate Your Plate Scores   Pre Score 47    Post Score 78             Education Questionnaire Score:  Knowledge Questionnaire Score - 04/05/23 1623       Knowledge Questionnaire Score   Post Score 24/24             Goals reviewed with patient; copy given to patient. Pt graduates from  Intensive/Traditional cardiac rehab program on 04/12/23 with completion of  50 exercise and education sessions. Pt maintained good attendance and progressed nicely during their  participation in rehab as evidenced by increased Teresa Parks level. Teresa Parks increase her distance on her post exercise walk test by 375 feet. Teresa Parks lost 4.4 kg while participating in the program.   Medication list reconciled. Repeat  PHQ score- 0 .  Pt has made significant lifestyle changes and should be commended for their success. Teresa Parks achieved their goals during cardiac rehab.   Pt plans to continue exercise at home walking. Metztli is considering joining the [employer]. Amiaya continues to smoke. Fabian says she has cut back. Smoking cessation encouraged.We are proud of Isabela's progress and weight loss! Thayer Headings RN BSN

## 2023-06-03 ENCOUNTER — Ambulatory Visit: Payer: BC Managed Care – PPO | Admitting: Cardiovascular Disease

## 2023-07-18 ENCOUNTER — Other Ambulatory Visit (HOSPITAL_COMMUNITY): Payer: Self-pay

## 2023-07-31 NOTE — Progress Notes (Signed)
Cardiology Office Note:    Date:  08/01/2023   ID:  Teresa Parks, DOB 1970-11-03, MRN [id number]  PCP:  Suzan Slick, MD   [facility] Providers Cardiologist:  Tonny Bollman, MD     Referring MD: Suzan Slick, MD   Chief Complaint  Patient presents with   Coronary Artery Disease    History of Present Illness:    Teresa Parks is a 53 y.o. female presenting for follow-up of coronary artery disease.  The patient initially presented in June 2024 with an inferior STEMI.  She was taken emergently for cardiac catheterization and found to have severe thrombotic stenosis of the mid RCA treated with primary PCI using a 4.0 x 28 mm Synergy DES.  She had mild nonobstructive plaquing elsewhere.  Echo showed preserved LVEF of 60 to 65% with no regional wall motion abnormalities, normal RV function, and no significant valvular disease.  Comorbid conditions include hypertension and mixed hyperlipidemia.  The patient is here alone today. She's doing well. Today, she denies symptoms of palpitations, chest pain, shortness of breath, orthopnea, PND, lower extremity edema, dizziness, or syncope. She's active with her grandchildren and has no symptoms with physical activity. She works as an Airline pilot.  Since starting [facility], she has discontinued glimepiride.  She continues on insulin.  She thinks her blood glucoses have improved.  Current Medications: Current Meds  Medication Sig   acetaminophen (TYLENOL) 325 MG tablet Take 325-650 mg by mouth as needed for headache.   aspirin 81 MG chewable tablet Chew 1 tablet (81 mg total) by mouth daily.   Continuous Glucose Sensor (FREESTYLE LIBRE 3 SENSOR) MISC USE AS DIRECTED SUBCUTANEOUS EVERY 15 DAYS for 30 days   cyclobenzaprine (FLEXERIL) 10 MG tablet Take 10 mg by mouth daily as needed for muscle spasms.   fenofibrate 160 MG tablet Take 1 tablet (160 mg total) by mouth daily. (Patient taking differently: Take 160 mg by mouth daily in  the afternoon.)   Glucagon (BAQSIMI ONE PACK) 3 MG/DOSE POWD Place 1 Dose into the nose as needed (low blood sugar).   glucose blood test strip Use as instructed   Icosapent Ethyl (VASCEPA) 0.5 g CAPS Take 3 capsules (1.5 g total) by mouth daily.   insulin aspart (NOVOLOG) 100 UNIT/ML injection Inject 10-30 Units into the skin 3 (three) times daily before meals. Take sliding scale as directed   insulin degludec (TRESIBA FLEXTOUCH) 200 UNIT/ML FlexTouch Pen Inject 20 Units into the skin every morning.   Insulin Pen Needle (MM PEN NEEDLES) 31G X 8 MM MISC USE 5 TIMES DAILY for 40   Lancets (FREESTYLE) lancets Use as instructed   metoprolol succinate (TOPROL-XL) 25 MG 24 hr tablet Take 1 tablet (25 mg total) by mouth daily.   MOUNJARO 2.5 MG/0.5ML Pen Inject 2.5 mg into the skin once a week.   nitroGLYCERIN (NITROSTAT) 0.4 MG SL tablet Place 1 tablet (0.4 mg total) under the tongue every 5 (five) minutes as needed for chest pain.   olmesartan (BENICAR) 40 MG tablet Take 1 tablet (40 mg total) by mouth daily.   rosuvastatin (CRESTOR) 10 MG tablet Take 1 tablet (10 mg total) by mouth daily.   ticagrelor (BRILINTA) 90 MG TABS tablet Take 1 tablet (90 mg total) by mouth 2 (two) times daily.   [DISCONTINUED] cetirizine (ZYRTEC ALLERGY) 10 MG tablet Take 1 tablet (10 mg total) by mouth daily.   [DISCONTINUED] insulin degludec (TRESIBA FLEXTOUCH) 200 UNIT/ML FlexTouch Pen Take 25 units  in the morning and 52 units at night (Patient taking differently: 20 Units every morning. Take 20 units in the morning)     Allergies:   Novocain [procaine]   ROS:   Please see the history of present illness.    All other systems reviewed and are negative.  EKGs/Labs/Other Studies Reviewed:    The following studies were reviewed today: Cardiac Studies & Procedures   CARDIAC CATHETERIZATION  CARDIAC CATHETERIZATION 12/21/2022  Narrative   Prox RCA to Mid RCA lesion is 95% stenosed.   A drug-eluting stent was  successfully placed using a SYNERGY XD 4.0X28.   Post intervention, there is a 0% residual stenosis.   LV end diastolic pressure is normal.   Recommend uninterrupted dual antiplatelet therapy with Aspirin 81mg  daily and Ticagrelor 90mg  twice daily for a minimum of 12 months (ACS-Class I recommendation).  1.  Severe single-vessel coronary artery disease with severe thrombotic stenosis of the mid RCA, treated successfully with PTCA and stenting using a 4.0 x 28 mm Synergy DES 2.  Patent left main, LAD, intermediate branch, and left circumflex with mild nonobstructive plaquing in the mid LAD 3.  Normal LVEDP  Recommendations: Aspirin and ticagrelor without interruption x 12 months.  Aggrastat infusion discontinued at completion of procedure.  Check 2D echo today.  Institute post MI medical therapy.  If patient remains stable without complication, she should be eligible for hospital discharge tomorrow morning directly from the CV-ICU.  Findings Coronary Findings Diagnostic  Dominance: Right  Left Main The vessel exhibits minimal luminal irregularities. Widely patent vessel, trifurcates into the LAD, intermediate branch, and circumflex  Left Anterior Descending There is mild diffuse disease throughout the vessel. The LAD reaches the apex.  The first diagonal is patent.  The mid LAD has mild irregularities but no stenosis greater than 30%.  Ramus Intermedius Vessel is angiographically normal.  Left Circumflex Vessel is angiographically normal. Widely patent vessel, supplies an obtuse marginal branch and a bifurcating posterolateral branch.  No stenosis throughout.  Right Coronary Artery Prox RCA to Mid RCA lesion is 95% stenosed. Vessel is the culprit lesion. The lesion is type C, ulcerative and heavily thrombotic.  Intervention  Prox RCA to Mid RCA lesion Stent CATH VISTA GUIDE 6FR JR4 guide catheter was inserted. Lesion crossed with guidewire using a WIRE RUNTHROUGH .[id number].  Pre-stent angioplasty was performed using a BALLN EMERGE MR 3.0X20. Maximum pressure:  6 atm. A drug-eluting stent was successfully placed using a SYNERGY XD 4.0X28. Maximum pressure: 14 atm. Post-stent angioplasty was performed using a BALL SAPPHIRE NC24 4.5X18. Maximum pressure:  14 atm. Post-Intervention Lesion Assessment The intervention was successful. Pre-interventional TIMI flow is 3. Post-intervention TIMI flow is 3. No complications occurred at this lesion. There is a 0% residual stenosis post intervention.    ECHOCARDIOGRAM  ECHOCARDIOGRAM COMPLETE 12/21/2022  Narrative ECHOCARDIOGRAM REPORT    Patient Name:   Teresa Parks Date of Exam: 12/21/2022 Medical Rec #:  [id number]      Height:       65.0 in Accession #:    [id number]     Weight:       210.0 lb Date of Birth:  Dec 27, 1970       BSA:          2.020 m Patient Age:    52 years       BP:           121/90 mmHg Patient Gender: F  HR:           86 bpm. Exam Location:  Inpatient  Procedure: 2D Echo, Cardiac Doppler and Color Doppler  Indications:    Chest pain  History:        Patient has no prior history of Echocardiogram examinations. Risk Factors:Diabetes and Current Smoker.  Sonographer:    Delcie Roch RDCS Referring Phys: [id number] Jaclynn Laumann   Sonographer Comments: Patient is obese and Technically difficult study due to poor echo windows. Image acquisition challenging due to patient body habitus. IMPRESSIONS   1. Left ventricular ejection fraction, by estimation, is 60 to 65%. The left ventricle has normal function. The left ventricle has no regional wall motion abnormalities. Left ventricular diastolic parameters are consistent with Grade I diastolic dysfunction (impaired relaxation). 2. Right ventricular systolic function is normal. The right ventricular size is normal. 3. The mitral valve is normal in structure. Trivial mitral valve regurgitation. No evidence of mitral stenosis. 4. The aortic  valve is normal in structure. Aortic valve regurgitation is not visualized. No aortic stenosis is present. 5. The inferior vena cava is normal in size with greater than 50% respiratory variability, suggesting right atrial pressure of 3 mmHg.  FINDINGS Left Ventricle: Left ventricular ejection fraction, by estimation, is 60 to 65%. The left ventricle has normal function. The left ventricle has no regional wall motion abnormalities. The left ventricular internal cavity size was normal in size. There is no left ventricular hypertrophy. Left ventricular diastolic parameters are consistent with Grade I diastolic dysfunction (impaired relaxation).  Right Ventricle: The right ventricular size is normal. No increase in right ventricular wall thickness. Right ventricular systolic function is normal.  Left Atrium: Left atrial size was normal in size.  Right Atrium: Right atrial size was normal in size.  Pericardium: There is no evidence of pericardial effusion.  Mitral Valve: The mitral valve is normal in structure. Trivial mitral valve regurgitation. No evidence of mitral valve stenosis.  Tricuspid Valve: The tricuspid valve is normal in structure. Tricuspid valve regurgitation is not demonstrated. No evidence of tricuspid stenosis.  Aortic Valve: The aortic valve is normal in structure. Aortic valve regurgitation is not visualized. No aortic stenosis is present.  Pulmonic Valve: The pulmonic valve was not well visualized. Pulmonic valve regurgitation is not visualized. No evidence of pulmonic stenosis.  Aorta: The aortic root is normal in size and structure.  Venous: The inferior vena cava is normal in size with greater than 50% respiratory variability, suggesting right atrial pressure of 3 mmHg.  IAS/Shunts: No atrial level shunt detected by color flow Doppler.   LEFT VENTRICLE PLAX 2D LVIDd:         4.60 cm   Diastology LVIDs:         3.00 cm   LV e' medial:    7.15 cm/s LV PW:          1.10 cm   LV E/e' medial:  11.1 LV IVS:        0.90 cm   LV e' lateral:   8.24 cm/s LVOT diam:     2.10 cm   LV E/e' lateral: 9.7 LV SV:         76 LV SV Index:   38 LVOT Area:     3.46 cm   RIGHT VENTRICLE             IVC RV S prime:     12.60 cm/s  IVC diam: 2.30 cm TAPSE (M-mode): 2.3 cm  LEFT  ATRIUM             Index LA diam:        3.20 cm 1.58 cm/m LA Vol (A2C):   37.9 ml 18.77 ml/m LA Vol (A4C):   40.7 ml 20.15 ml/m LA Biplane Vol: 40.2 ml 19.91 ml/m AORTIC VALVE LVOT Vmax:   120.00 cm/s LVOT Vmean:  79.500 cm/s LVOT VTI:    0.219 m  AORTA Ao Root diam: 2.90 cm Ao Asc diam:  3.00 cm  MITRAL VALVE MV Area (PHT): 3.48 cm    SHUNTS MV Decel Time: 218 msec    Systemic VTI:  0.22 m MV E velocity: 79.70 cm/s  Systemic Diam: 2.10 cm MV A velocity: 88.00 cm/s MV E/A ratio:  0.91  Arvilla Meres MD Electronically signed by Arvilla Meres MD Signature Date/Time: 12/21/2022/3:46:29 PM    Final             EKG:        Recent Labs: 12/22/2022: Hemoglobin 13.8; Platelets 280 02/15/2023: ALT 31; BUN 19; Creatinine, Ser 0.93; Potassium 4.5; Sodium 138  Recent Lipid Panel    Component Value Date/Time   CHOL 111 02/15/2023 0901   TRIG 286 (H) 02/15/2023 0901   HDL 24 (L) 02/15/2023 0901   CHOLHDL 4.6 (H) 02/15/2023 0901   CHOLHDL 6.5 12/22/2022 0209   VLDL UNABLE TO CALCULATE IF TRIGLYCERIDE OVER 400 mg/dL 09/60/4540 9811   LDLCALC 43 02/15/2023 0901   LDLDIRECT 45 12/22/2022 0209     Risk Assessment/Calculations:                Physical Exam:    VS:  BP 120/80   Pulse 83   Ht 5\' 4"  (1.626 m)   Wt 197 lb (89.4 kg)   SpO2 94%   BMI 33.81 kg/m     Wt Readings from Last 3 Encounters:  08/01/23 197 lb (89.4 kg)  04/10/23 204 lb 9.4 oz (92.8 kg)  01/22/23 211 lb 12.8 oz (96.1 kg)     GEN:  Well nourished, well developed in no acute distress HEENT: Normal NECK: No JVD; No carotid bruits LYMPHATICS: No lymphadenopathy CARDIAC: RRR, no  murmurs, rubs, gallops RESPIRATORY:  Clear to auscultation without rales, wheezing or rhonchi  ABDOMEN: Soft, non-tender, non-distended MUSCULOSKELETAL:  No edema; No deformity  SKIN: Warm and dry NEUROLOGIC:  Alert and oriented x 3 PSYCHIATRIC:  Normal affect   Assessment & Plan Coronary artery disease involving native coronary artery of native heart without angina pectoris Appears clinically stable without angina.  Remains on DAPT with aspirin and ticagrelor.  Cardiac cath study reviewed with single-vessel CAD treated with primary PCI at the time of her STEMI.  She needs to remain on DAPT through 12 months (June 2025).  Will arrange 26-month APP follow-up with plans for her to discontinue ticagrelor when she returns. Hyperlipidemia associated with type 2 diabetes mellitus (HCC) Lipid panel August 2024 shows a cholesterol 111, HDL 24, triglycerides 286, LDL 43.  Treated with multidrug therapy on fenofibrate, rosuvastatin, and Vascepa.  With multidrug therapy, will repeat lipids and LFTs today. Primary hypertension Treated with olmesartan and and metoprolol succinate.  Check metabolic panel today Controlled type 2 diabetes mellitus without complication, without long-term current use of insulin (HCC) Patient on insulin and Mounjaro.  Patient has lost weight and has discontinued glimepiride.  Repeat hemoglobin A1c today.      Medication Adjustments/Labs and Tests Ordered: Current medicines are reviewed at length with the patient today.  Concerns regarding medicines are outlined above.  Orders Placed This Encounter  Procedures   Comprehensive metabolic panel   Lipid panel   Hemoglobin A1c   No orders of the defined types were placed in this encounter.   Patient Instructions  Lab Work: CMET, Lipids, A1C today If you have labs (blood work) drawn today and your tests are completely normal, you will receive your results only by: MyChart Message (if you have MyChart) OR A paper copy in the  mail If you have any lab test that is abnormal or we need to change your treatment, we will call you to review the results.  Follow-Up: At [facility], you and your health needs are our priority.  As part of our continuing mission to provide you with exceptional heart care, we have created designated Provider Care Teams.  These Care Teams include your primary Cardiologist (physician) and Advanced Practice Providers (APPs -  Physician Assistants and Nurse Practitioners) who all work together to provide you with the care you need, when you need it.  Your next appointment:   6 month(s)  Provider:   APP     1st Floor: - Lobby - Registration  - Pharmacy  - Lab - Cafe  2nd Floor: - PV Lab - Diagnostic Testing (echo, CT, nuclear med)  3rd Floor: - Vacant  4th Floor: - TCTS (cardiothoracic surgery) - AFib Clinic - Structural Heart Clinic - Vascular Surgery  - Vascular Ultrasound  5th Floor: - HeartCare Cardiology (general and EP) - Clinical Pharmacy for coumadin, hypertension, lipid, weight-loss medications, and med management appointments    Valet parking services will be available as well.      Signed, Tonny Bollman, MD  08/01/2023 8:36 AM    [facility]

## 2023-08-01 ENCOUNTER — Encounter: Payer: Self-pay | Admitting: Cardiovascular Disease

## 2023-08-01 ENCOUNTER — Ambulatory Visit: Payer: BC Managed Care – PPO | Attending: Cardiovascular Disease | Admitting: Cardiovascular Disease

## 2023-08-01 VITALS — BP 120/80 | HR 83 | Ht 64.0 in | Wt 197.0 lb

## 2023-08-01 DIAGNOSIS — I2510 Atherosclerotic heart disease of native coronary artery without angina pectoris: Secondary | ICD-10-CM | POA: Diagnosis not present

## 2023-08-01 DIAGNOSIS — E119 Type 2 diabetes mellitus without complications: Secondary | ICD-10-CM

## 2023-08-01 DIAGNOSIS — E1169 Type 2 diabetes mellitus with other specified complication: Secondary | ICD-10-CM | POA: Diagnosis not present

## 2023-08-01 DIAGNOSIS — I10 Essential (primary) hypertension: Secondary | ICD-10-CM | POA: Diagnosis not present

## 2023-08-01 DIAGNOSIS — E785 Hyperlipidemia, unspecified: Secondary | ICD-10-CM | POA: Diagnosis not present

## 2023-08-01 NOTE — Assessment & Plan Note (Addendum)
Lipid panel August 2024 shows a cholesterol 111, HDL 24, triglycerides 286, LDL 43.  Treated with multidrug therapy on fenofibrate, rosuvastatin, and Vascepa.  With multidrug therapy, will repeat lipids and LFTs today.

## 2023-08-01 NOTE — Patient Instructions (Signed)
Lab Work: CMET, Lipids, A1C today If you have labs (blood work) drawn today and your tests are completely normal, you will receive your results only by: MyChart Message (if you have MyChart) OR A paper copy in the mail If you have any lab test that is abnormal or we need to change your treatment, we will call you to review the results.  Follow-Up: At [facility], you and your health needs are our priority.  As part of our continuing mission to provide you with exceptional heart care, we have created designated Provider Care Teams.  These Care Teams include your primary Cardiologist (physician) and Advanced Practice Providers (APPs -  Physician Assistants and Nurse Practitioners) who all work together to provide you with the care you need, when you need it.  Your next appointment:   6 month(s)  Provider:   APP     1st Floor: - Lobby - Registration  - Pharmacy  - Lab - Cafe  2nd Floor: - PV Lab - Diagnostic Testing (echo, CT, nuclear med)  3rd Floor: - Vacant  4th Floor: - TCTS (cardiothoracic surgery) - AFib Clinic - Structural Heart Clinic - Vascular Surgery  - Vascular Ultrasound  5th Floor: - HeartCare Cardiology (general and EP) - Clinical Pharmacy for coumadin, hypertension, lipid, weight-loss medications, and med management appointments    Valet parking services will be available as well.

## 2023-08-01 NOTE — Assessment & Plan Note (Addendum)
Treated with olmesartan and and metoprolol succinate.  Check metabolic panel today

## 2023-08-02 LAB — COMPREHENSIVE METABOLIC PANEL
ALT: 21 [IU]/L (ref 0–32)
AST: 28 [IU]/L (ref 0–40)
Albumin: 4.2 g/dL (ref 3.8–4.9)
Alkaline Phosphatase: 75 [IU]/L (ref 44–121)
BUN/Creatinine Ratio: 14 (ref 9–23)
BUN: 17 mg/dL (ref 6–24)
Bilirubin Total: 0.3 mg/dL (ref 0.0–1.2)
CO2: 23 mmol/L (ref 20–29)
Calcium: 9.5 mg/dL (ref 8.7–10.2)
Chloride: 102 mmol/L (ref 96–106)
Creatinine, Ser: 1.19 mg/dL — ABNORMAL HIGH (ref 0.57–1.00)
Globulin, Total: 3 g/dL (ref 1.5–4.5)
Glucose: 195 mg/dL — ABNORMAL HIGH (ref 70–99)
Potassium: 5.1 mmol/L (ref 3.5–5.2)
Sodium: 139 mmol/L (ref 134–144)
Total Protein: 7.2 g/dL (ref 6.0–8.5)
eGFR: 55 mL/min/{1.73_m2} — ABNORMAL LOW (ref >59)

## 2023-08-02 LAB — HEMOGLOBIN A1C
Est. average glucose Bld gHb Est-mCnc: 206 mg/dL
Hgb A1c MFr Bld: 8.8 % — ABNORMAL HIGH (ref 4.8–5.6)

## 2023-08-02 LAB — LIPID PANEL
Chol/HDL Ratio: 4.3 {ratio} (ref 0.0–4.4)
Cholesterol, Total: 119 mg/dL (ref 100–199)
HDL: 28 mg/dL — ABNORMAL LOW (ref >39)
LDL Chol Calc (NIH): 52 mg/dL (ref 0–99)
Triglycerides: 247 mg/dL — ABNORMAL HIGH (ref 0–149)
VLDL Cholesterol Cal: 39 mg/dL (ref 5–40)

## 2023-08-27 ENCOUNTER — Other Ambulatory Visit: Payer: Self-pay | Admitting: Family Medicine

## 2023-08-27 DIAGNOSIS — E1165 Type 2 diabetes mellitus with hyperglycemia: Secondary | ICD-10-CM

## 2023-08-27 DIAGNOSIS — E781 Pure hyperglyceridemia: Secondary | ICD-10-CM

## 2023-08-27 MED ORDER — ICOSAPENT ETHYL 0.5 G PO CAPS: 3.0000 | ORAL_CAPSULE | Freq: Every day | ORAL | 0 refills | Status: DC

## 2023-08-27 NOTE — Telephone Encounter (Signed)
Copied from CRM [id number]. Topic: Clinical - Medication Refill >> Aug 27, 2023  9:19 AM Bo Mcclintock wrote: Most Recent Primary Care Visit:  Provider: Suzan Slick  Department: [facility] AT [facility]  Visit Type: NEW PATIENT  Date: 01/22/2023  Medication: Icosapent Ethyl (VASCEPA) 0.5 g CAPS   Has the patient contacted their pharmacy? Yes (Agent: If no, request that the patient contact the pharmacy for the refill. If patient does not wish to contact the pharmacy document the reason why and proceed with request.) (Agent: If yes, when and what did the pharmacy advise?) Contact PCP  Is this the correct pharmacy for this prescription? Yes If no, delete pharmacy and type the correct one.  This is the patient's preferred pharmacy:  [facility] [address], [place] - [address] [place] [address] [place] [address] Phone: [phone] Fax: [phone]   Has the prescription been filled recently? No  Is the patient out of the medication? Yes  Has the patient been seen for an appointment in the last year OR does the patient have an upcoming appointment? Yes  Can we respond through MyChart? Yes  Agent: Please be advised that Rx refills may take up to 3 business days. We ask that you follow-up with your pharmacy.

## 2023-09-18 DIAGNOSIS — I2510 Atherosclerotic heart disease of native coronary artery without angina pectoris: Secondary | ICD-10-CM | POA: Diagnosis not present

## 2023-09-18 DIAGNOSIS — I10 Essential (primary) hypertension: Secondary | ICD-10-CM | POA: Diagnosis not present

## 2023-09-18 DIAGNOSIS — E1165 Type 2 diabetes mellitus with hyperglycemia: Secondary | ICD-10-CM | POA: Diagnosis not present

## 2023-09-18 DIAGNOSIS — E781 Pure hyperglyceridemia: Secondary | ICD-10-CM | POA: Diagnosis not present

## 2023-09-25 DIAGNOSIS — D239 Other benign neoplasm of skin, unspecified: Secondary | ICD-10-CM | POA: Diagnosis not present

## 2023-09-25 DIAGNOSIS — L2089 Other atopic dermatitis: Secondary | ICD-10-CM | POA: Diagnosis not present

## 2023-09-25 DIAGNOSIS — L718 Other rosacea: Secondary | ICD-10-CM | POA: Diagnosis not present

## 2023-12-20 ENCOUNTER — Ambulatory Visit: Admitting: Family Medicine

## 2023-12-20 ENCOUNTER — Encounter: Payer: Self-pay | Admitting: Family Medicine

## 2023-12-20 VITALS — BP 116/70 | HR 113 | Temp 98.6°F | Resp 18 | Ht 64.0 in | Wt 191.0 lb

## 2023-12-20 DIAGNOSIS — E1165 Type 2 diabetes mellitus with hyperglycemia: Secondary | ICD-10-CM | POA: Diagnosis not present

## 2023-12-20 DIAGNOSIS — J069 Acute upper respiratory infection, unspecified: Secondary | ICD-10-CM

## 2023-12-20 DIAGNOSIS — R051 Acute cough: Secondary | ICD-10-CM | POA: Diagnosis not present

## 2023-12-20 LAB — POC COVID19 BINAXNOW: SARS Coronavirus 2 Ag: NEGATIVE

## 2023-12-20 MED ORDER — AZITHROMYCIN 250 MG PO TABS: ORAL_TABLET | ORAL | 0 refills | Status: AC

## 2023-12-20 MED ORDER — PROMETHAZINE-DM 6.25-15 MG/5ML PO SYRP: 5.0000 mL | ORAL_SOLUTION | Freq: Every evening | ORAL | 0 refills | Status: DC | PRN

## 2023-12-20 NOTE — Progress Notes (Signed)
 Acute Office Visit  Subjective:     Patient ID: Teresa Parks, female    DOB: 12-17-1970, 53 y.o.   MRN: [id number]  No chief complaint on file.   Cough Associated symptoms include ear pain.  Patient is in today for acute visit.  Cough Pt has had cough and sneezing since Monday. She thought it was just her allergies. She reports the cough is worse at night. She also has some nasal congestion with green mucus on Wednesday. She also has some left ear pain. She hasn't been trying anything for her cough besides some Tessalon  perles she had at home. She reports this doesn't help her cough. She has had chest pains with coughing, no SOB.   Review of Systems  HENT:  Positive for congestion and ear pain.   Respiratory:  Positive for cough.   All other systems reviewed and are negative.      Objective:    There were no vitals taken for this visit. BP Readings from Last 3 Encounters:  12/20/23 116/70  08/01/23 120/80  01/22/23 133/73      Physical Exam Vitals and nursing note reviewed.  Constitutional:      Appearance: Normal appearance. She is obese.  HENT:     Head: Normocephalic and atraumatic.     Right Ear: Tympanic membrane, ear canal and external ear normal.     Left Ear: Tympanic membrane, ear canal and external ear normal.     Nose: Nose normal.     Mouth/Throat:     Mouth: Mucous membranes are moist.     Pharynx: Oropharynx is clear.  Eyes:     Conjunctiva/sclera: Conjunctivae normal.     Pupils: Pupils are equal, round, and reactive to light.  Cardiovascular:     Rate and Rhythm: Regular rhythm. Tachycardia present.     Pulses: Normal pulses.     Heart sounds: Normal heart sounds.  Pulmonary:     Effort: Pulmonary effort is normal.     Breath sounds: Normal breath sounds.  Abdominal:     General: Abdomen is flat. Bowel sounds are normal.  Skin:    General: Skin is warm.     Capillary Refill: Capillary refill takes less than 2 seconds.  Neurological:      General: No focal deficit present.     Mental Status: She is alert and oriented to person, place, and time. Mental status is at baseline.  Psychiatric:        Mood and Affect: Mood normal.        Behavior: Behavior normal.        Thought Content: Thought content normal.        Judgment: Judgment normal.   No results found for any visits on 12/20/23.      Assessment & Plan:   Problem List Items Addressed This Visit   None Visit Diagnoses       Acute cough    -  Primary   Relevant Orders   POC COVID-19 BinaxNow       No orders of the defined types were placed in this encounter.  Acute cough -     POC COVID-19 BinaxNow  Upper respiratory tract infection, unspecified type -     Azithromycin ; Take 2 tablets on day 1, then 1 tablet daily on days 2 through 5  Dispense: 6 tablet; Refill: 0 -     Promethazine -DM; Take 5 mLs by mouth at bedtime as needed for cough.  Dispense: 118  mL; Refill: 0   Pt with acute cough, covid negative, likely URI Continue Tessalon  perles TID prn and add Promethazine  DM at nighttime and Z pack x 5 days.   No follow-ups on file.  Manette Section, MD

## 2023-12-23 ENCOUNTER — Encounter: Payer: Self-pay | Admitting: Family Medicine

## 2023-12-23 DIAGNOSIS — J069 Acute upper respiratory infection, unspecified: Secondary | ICD-10-CM

## 2023-12-23 MED ORDER — PROMETHAZINE-DM 6.25-15 MG/5ML PO SYRP: 5.0000 mL | ORAL_SOLUTION | Freq: Every evening | ORAL | 0 refills | Status: AC | PRN

## 2023-12-26 ENCOUNTER — Encounter: Payer: Self-pay | Admitting: Cardiovascular Disease

## 2023-12-26 ENCOUNTER — Other Ambulatory Visit: Payer: Self-pay | Admitting: Physician Assistant

## 2023-12-27 DIAGNOSIS — I2510 Atherosclerotic heart disease of native coronary artery without angina pectoris: Secondary | ICD-10-CM | POA: Diagnosis not present

## 2023-12-27 DIAGNOSIS — E781 Pure hyperglyceridemia: Secondary | ICD-10-CM | POA: Diagnosis not present

## 2023-12-27 DIAGNOSIS — I10 Essential (primary) hypertension: Secondary | ICD-10-CM | POA: Diagnosis not present

## 2023-12-27 DIAGNOSIS — E1165 Type 2 diabetes mellitus with hyperglycemia: Secondary | ICD-10-CM | POA: Diagnosis not present

## 2024-01-03 ENCOUNTER — Encounter: Payer: Self-pay | Admitting: Cardiovascular Disease

## 2024-01-03 DIAGNOSIS — I2119 ST elevation (STEMI) myocardial infarction involving other coronary artery of inferior wall: Secondary | ICD-10-CM

## 2024-01-03 MED ORDER — BRILINTA 90 MG PO TABS: 90.0000 mg | ORAL_TABLET | Freq: Two times a day (BID) | ORAL | 3 refills | Status: DC

## 2024-02-08 ENCOUNTER — Other Ambulatory Visit: Payer: Self-pay | Admitting: Physician Assistant

## 2024-02-29 NOTE — Progress Notes (Unsigned)
 Cardiology Office Note   Date:  03/02/2024  ID:  Teresa Parks, DOB 12/01/1970, MRN [id number] PCP: Colette Torrence GRADE, MD  [facility] Providers Cardiologist:  Ozell Fell, MD    History of Present Illness Teresa Parks is a 53 y.o. female who has a past medical history significant for coronary artery disease, type 2 diabetes mellitus, hypertension, and hyperlipidemia she is here for follow-up appointment.  She initially presented June 2024 with an inferior STEMI.  Was taken emergently for cardiac catheterization and found to have severe thrombotic stenosis of the mid RCA treated with primary PCI using 4.0 x 28 mm Synergy DES.  Had mild nonobstructive plaque elsewhere.  Echo showed preserved LVEF of 66 5% with no regional wall motion abnormalities, normal RV function, no significant valvular disease.  Comorbidities include hypertension and hyperlipidemia.  She was last seen in Jan 2025.  She is doing well at that time.  She denies symptoms of palpitations, chest pain, SOB, orthopnea, PND, lower extremity edema, dizziness, or syncope.  She is active with grandchildren with no symptoms with physical activity.  Works as an Airline pilot.  Since starting Mounjaro, she has discontinued her glimepiride .  Continues insulin .  She does think her blood glucoses have improved.  Today, she presents with a history of myocardial infarction for follow-up regarding her dual antiplatelet therapy.  She experienced a myocardial infarction in June of the previous year and was initially placed on dual antiplatelet therapy with Brilinta  and aspirin . Due to the high cost of Brilinta , she has recently been taking only aspirin . She reports no chest pain or other issues since her last visit.  She is on rosuvastatin , metoprolol , and olmesartan  but has encountered issues with refills, receiving prescriptions with no refills. She has been on Vascepa  and fenofibrate  for triglyceride management, with levels  fluctuating between 200-400 mg/dL. Currently, she takes 1.5 grams of Vascepa  daily, which is below the typical dose. Her triglyceride levels have significantly improved from a peak of 4000 mg/dL in 7984.  She has reduced her weight from 235 pounds in 2015 to 197 pounds currently. Her family history includes her father having a massive heart attack at age 54 and her father-in-law passing from a heart attack at age 54  Reports no shortness of breath nor dyspnea on exertion. Reports no chest pain, pressure, or tightness. No edema, orthopnea, PND. Reports no palpitations.   Discussed the use of AI scribe software for clinical note transcription with the patient, who gave verbal consent to proceed.  ROS: Pertinent ROS in HPI  Studies Reviewed      CARDIAC CATHETERIZATION   CARDIAC CATHETERIZATION 12/21/2022   Narrative   Prox RCA to Mid RCA lesion is 95% stenosed.   A drug-eluting stent was successfully placed using a SYNERGY XD 4.0X28.   Post intervention, there is a 0% residual stenosis.   LV end diastolic pressure is normal.   Recommend uninterrupted dual antiplatelet therapy with Aspirin  81mg  daily and Ticagrelor  90mg  twice daily for a minimum of 12 months (ACS-Class I recommendation).   1.  Severe single-vessel coronary artery disease with severe thrombotic stenosis of the mid RCA, treated successfully with PTCA and stenting using a 4.0 x 28 mm Synergy DES 2.  Patent left main, LAD, intermediate branch, and left circumflex with mild nonobstructive plaquing in the mid LAD 3.  Normal LVEDP   Recommendations: Aspirin  and ticagrelor  without interruption x 12 months.  Aggrastat  infusion discontinued at completion of procedure.  Check 2D echo  today.  Institute post MI medical therapy.  If patient remains stable without complication, she should be eligible for hospital discharge tomorrow morning directly from the CV-ICU.   Findings Coronary Findings Diagnostic  Dominance: Right   Left  Main The vessel exhibits minimal luminal irregularities. Widely patent vessel, trifurcates into the LAD, intermediate branch, and circumflex   Left Anterior Descending There is mild diffuse disease throughout the vessel. The LAD reaches the apex.  The first diagonal is patent.  The mid LAD has mild irregularities but no stenosis greater than 30%.   Ramus Intermedius Vessel is angiographically normal.   Left Circumflex Vessel is angiographically normal. Widely patent vessel, supplies an obtuse marginal branch and a bifurcating posterolateral branch.  No stenosis throughout.   Right Coronary Artery Prox RCA to Mid RCA lesion is 95% stenosed. Vessel is the culprit lesion. The lesion is type C, ulcerative and heavily thrombotic.   Intervention   Prox RCA to Mid RCA lesion Stent [place] GUIDE 6FR JR4 guide catheter was inserted. Lesion crossed with guidewire using a WIRE RUNTHROUGH .[id number]. Pre-stent angioplasty was performed using a BALLN EMERGE MR 3.0X20. Maximum pressure:  6 atm. A drug-eluting stent was successfully placed using a SYNERGY XD 4.0X28. Maximum pressure: 14 atm. Post-stent angioplasty was performed using a BALL SAPPHIRE NC24 4.5X18. Maximum pressure:  14 atm. Post-Intervention Lesion Assessment The intervention was successful. Pre-interventional TIMI flow is 3. Post-intervention TIMI flow is 3. No complications occurred at this lesion. There is a 0% residual stenosis post intervention.    ECHOCARDIOGRAM   ECHOCARDIOGRAM COMPLETE 12/21/2022   Narrative ECHOCARDIOGRAM REPORT       Patient Name:   Teresa Parks Date of Exam: 12/21/2022 Medical Rec #:  [id number]      Height:       65.0 in Accession #:    [id number]     Weight:       210.0 lb Date of Birth:  04-26-1971       BSA:          2.020 m Patient Age:    53 years       BP:           121/90 mmHg Patient Gender: F              HR:           86 bpm. Exam Location:  Inpatient   Procedure: 2D Echo, Cardiac Doppler  and Color Doppler   Indications:    Chest pain   History:        Patient has no prior history of Echocardiogram examinations. Risk Factors:Diabetes and Current Smoker.   Sonographer:    Tinnie Barefoot RDCS Referring Phys: [id number] MICHAEL COOPER     Sonographer Comments: Patient is obese and Technically difficult study due to poor echo windows. Image acquisition challenging due to patient body habitus. IMPRESSIONS     1. Left ventricular ejection fraction, by estimation, is 60 to 65%. The left ventricle has normal function. The left ventricle has no regional wall motion abnormalities. Left ventricular diastolic parameters are consistent with Grade I diastolic dysfunction (impaired relaxation). 2. Right ventricular systolic function is normal. The right ventricular size is normal. 3. The mitral valve is normal in structure. Trivial mitral valve regurgitation. No evidence of mitral stenosis. 4. The aortic valve is normal in structure. Aortic valve regurgitation is not visualized. No aortic stenosis is present. 5. The inferior vena cava is normal in size with greater than  50% respiratory variability, suggesting right atrial pressure of 3 mmHg.   FINDINGS Left Ventricle: Left ventricular ejection fraction, by estimation, is 60 to 65%. The left ventricle has normal function. The left ventricle has no regional wall motion abnormalities. The left ventricular internal cavity size was normal in size. There is no left ventricular hypertrophy. Left ventricular diastolic parameters are consistent with Grade I diastolic dysfunction (impaired relaxation).   Right Ventricle: The right ventricular size is normal. No increase in right ventricular wall thickness. Right ventricular systolic function is normal.   Left Atrium: Left atrial size was normal in size.   Right Atrium: Right atrial size was normal in size.   Pericardium: There is no evidence of pericardial effusion.   Mitral Valve: The mitral  valve is normal in structure. Trivial mitral valve regurgitation. No evidence of mitral valve stenosis.   Tricuspid Valve: The tricuspid valve is normal in structure. Tricuspid valve regurgitation is not demonstrated. No evidence of tricuspid stenosis.   Aortic Valve: The aortic valve is normal in structure. Aortic valve regurgitation is not visualized. No aortic stenosis is present.   Pulmonic Valve: The pulmonic valve was not well visualized. Pulmonic valve regurgitation is not visualized. No evidence of pulmonic stenosis.   Aorta: The aortic root is normal in size and structure.   Venous: The inferior vena cava is normal in size with greater than 50% respiratory variability, suggesting right atrial pressure of 3 mmHg.   IAS/Shunts: No atrial level shunt detected by color flow Doppler.     LEFT VENTRICLE PLAX 2D LVIDd:         4.60 cm   Diastology LVIDs:         3.00 cm   LV e' medial:    7.15 cm/s LV PW:         1.10 cm   LV E/e' medial:  11.1 LV IVS:        0.90 cm   LV e' lateral:   8.24 cm/s LVOT diam:     2.10 cm   LV E/e' lateral: 9.7 LV SV:         76 LV SV Index:   38 LVOT Area:     3.46 cm     RIGHT VENTRICLE             IVC RV S prime:     12.60 cm/s  IVC diam: 2.30 cm TAPSE (M-mode): 2.3 cm   LEFT ATRIUM             Index LA diam:        3.20 cm 1.58 cm/m LA Vol (A2C):   37.9 ml 18.77 ml/m LA Vol (A4C):   40.7 ml 20.15 ml/m LA Biplane Vol: 40.2 ml 19.91 ml/m AORTIC VALVE LVOT Vmax:   120.00 cm/s LVOT Vmean:  79.500 cm/s LVOT VTI:    0.219 m   AORTA Ao Root diam: 2.90 cm Ao Asc diam:  3.00 cm   MITRAL VALVE MV Area (PHT): 3.48 cm    SHUNTS MV Decel Time: 218 msec    Systemic VTI:  0.22 m MV E velocity: 79.70 cm/s  Systemic Diam: 2.10 cm MV A velocity: 88.00 cm/s MV E/A ratio:  0.91   Toribio Fuel MD Electronically signed by Toribio Fuel MD Signature Date/Time: 12/21/2022/3:46:29 PM       Final           Physical Exam VS:  BP 130/74  (BP Location: Right Arm, Patient Position: Sitting, Cuff  Size: Normal)   Pulse 91   Ht 5' 4 (1.626 m)   Wt 197 lb (89.4 kg)   SpO2 97%   BMI 33.81 kg/m        Wt Readings from Last 3 Encounters:  03/02/24 197 lb (89.4 kg)  12/20/23 191 lb (86.6 kg)  08/01/23 197 lb (89.4 kg)    GEN: Well nourished, well developed in no acute distress NECK: No JVD; No carotid bruits CARDIAC: RRR, no murmurs, rubs, gallops RESPIRATORY:  Clear to auscultation without rales, wheezing or rhonchi  ABDOMEN: Soft, non-tender, non-distended EXTREMITIES:  No edema; No deformity   ASSESSMENT AND PLAN  Coronary artery disease, status post myocardial infarction Status post myocardial infarction June 2024. On dual antiplatelet therapy. Plan to discontinue aspirin  due to bleeding risk and continue Brilinta  for coronary protection. Ran out of Brilinta  due to cost. - Discontinue aspirin . - Continue Brilinta . - Contact PharmD team for coupon programs to reduce Brilinta  cost. (Now generic and can fill here for less than 50$ a month) - Refill all medications including Brilinta , rosuvastatin , metoprolol , and olmesartan .  Hypertriglyceridemia and dyslipidemia Hypertriglyceridemia with triglyceride levels previously between 200-400 mg/dL. Vascepa  dose suboptimal. Fenofibrate  dose adequate. History of extremely high triglyceride levels with pancreatitis. Current weight 197 lbs, goal 150 lbs. Mounjaro used for glucose control, not effective for weight loss. - Increase Vascepa  to 4 grams daily. - Repeat lipid panel in 8 weeks to assess triglyceride levels. - Monitor weight and continue lifestyle modifications for weight management.  Hypertension - Blood pressure well-controlled on current regimen including Benicar  40 mg daily, metoprolol  succinate 25 mg, continue medications - Blood pressure monitoring encouraged  Type 2 diabetes mellitus -Per primary -Patient is currently on Mounjaro, Tresiba , insulin  sliding scale  with A1c 8.3 (12/20/2023)      Dispo: She can follow-up in 6 months with Dr. Wonda  Signed, Orren LOISE Fabry, PA-C

## 2024-03-02 ENCOUNTER — Ambulatory Visit: Attending: Cardiovascular Disease | Admitting: Physician Assistant

## 2024-03-02 ENCOUNTER — Other Ambulatory Visit (HOSPITAL_COMMUNITY): Payer: Self-pay

## 2024-03-02 VITALS — BP 130/74 | HR 91 | Ht 64.0 in | Wt 197.0 lb

## 2024-03-02 DIAGNOSIS — I2510 Atherosclerotic heart disease of native coronary artery without angina pectoris: Secondary | ICD-10-CM | POA: Diagnosis not present

## 2024-03-02 DIAGNOSIS — I2119 ST elevation (STEMI) myocardial infarction involving other coronary artery of inferior wall: Secondary | ICD-10-CM

## 2024-03-02 DIAGNOSIS — E1169 Type 2 diabetes mellitus with other specified complication: Secondary | ICD-10-CM

## 2024-03-02 DIAGNOSIS — E119 Type 2 diabetes mellitus without complications: Secondary | ICD-10-CM | POA: Diagnosis not present

## 2024-03-02 DIAGNOSIS — E785 Hyperlipidemia, unspecified: Secondary | ICD-10-CM

## 2024-03-02 DIAGNOSIS — Z79899 Other long term (current) drug therapy: Secondary | ICD-10-CM

## 2024-03-02 DIAGNOSIS — I10 Essential (primary) hypertension: Secondary | ICD-10-CM

## 2024-03-02 MED ORDER — ICOSAPENT ETHYL 1 G PO CAPS: 2.0000 g | ORAL_CAPSULE | Freq: Two times a day (BID) | ORAL | 10 refills | Status: AC

## 2024-03-02 NOTE — Patient Instructions (Signed)
 Medication Instructions:  Your physician has recommended you make the following change in your medication:  INCREASE VASCEPA  TO 2 G TWICE DAILY  STOP ASPIRIN   *If you need a refill on your cardiac medications before your next appointment, please call your pharmacy*  Lab Work: TO BE DONE IN 8 WEEK AT PRIMARY CARE DOCTOR: LIPIDS If you have labs (blood work) drawn today and your tests are completely normal, you will receive your results only by: MyChart Message (if you have MyChart) OR A paper copy in the mail If you have any lab test that is abnormal or we need to change your treatment, we will call you to review the results.  Testing/Procedures: NONE  Follow-Up: At [facility], you and your health needs are our priority.  As part of our continuing mission to provide you with exceptional heart care, our providers are all part of one team.  This team includes your primary Cardiologist (physician) and Advanced Practice Providers or APPs (Physician Assistants and Nurse Practitioners) who all work together to provide you with the care you need, when you need it.  Your next appointment:   6 month(s)  Provider:   Ozell Fell, MD   We recommend signing up for the patient portal called MyChart.  Sign up information is provided on this After Visit Summary.  MyChart is used to connect with patients for Virtual Visits (Telemedicine).  Patients are able to view lab/test results, encounter notes, upcoming appointments, etc.  Non-urgent messages can be sent to your provider as well.   To learn more about what you can do with MyChart, go to [email or web address].

## 2024-04-01 DIAGNOSIS — E781 Pure hyperglyceridemia: Secondary | ICD-10-CM | POA: Diagnosis not present

## 2024-04-10 DIAGNOSIS — E781 Pure hyperglyceridemia: Secondary | ICD-10-CM | POA: Diagnosis not present

## 2024-04-10 DIAGNOSIS — I2510 Atherosclerotic heart disease of native coronary artery without angina pectoris: Secondary | ICD-10-CM | POA: Diagnosis not present

## 2024-04-10 DIAGNOSIS — I10 Essential (primary) hypertension: Secondary | ICD-10-CM | POA: Diagnosis not present

## 2024-04-15 DIAGNOSIS — R6889 Other general symptoms and signs: Secondary | ICD-10-CM | POA: Diagnosis not present

## 2024-04-28 ENCOUNTER — Other Ambulatory Visit (HOSPITAL_COMMUNITY): Payer: Self-pay

## 2024-04-28 ENCOUNTER — Telehealth: Payer: Self-pay | Admitting: Pharmacy Technician

## 2024-04-28 MED ORDER — TICAGRELOR 90 MG PO TABS: 90.0000 mg | ORAL_TABLET | Freq: Two times a day (BID) | ORAL | 3 refills | Status: AC

## 2024-04-28 NOTE — Addendum Note (Signed)
 Addended by: GORDON RONAL SQUIBB on: 04/28/2024 02:14 PM   Modules accepted: Orders

## 2024-04-28 NOTE — Telephone Encounter (Signed)
 Hi, insurance is now saying the brand brilinta  is not covered and they will only pay for generic. The generic was expensive but now it is 6.80 for 30 days . Can a new rx be sent in to her pharmacy saying generic is ok? Thank you             Per pwd:AMJWI PRODUCT NOT COVERED, USE GENERIC EQUIVALENT PRODUCT OR SERVICE NOT COVERED.  PLEASE CONTACT PRESCRIBER    Reason it was put daw originally: Teresa Parks  01/03/2024 3:49 PM I need a new prescription sent to [facility] in [place]. The one that was sent was for the generic of brilinta  and my cost is $250. However, insurance covers the name brand at no cost to me.  Not sure why my refill prescription was done for a generic instead of name brand. Could a new prescription for the name brand brilinta  be sent?  Thank you [name]

## 2024-04-28 NOTE — Telephone Encounter (Signed)
 Pharmacist is requesting pt's medication be changed to generic brilinta . Please address

## 2024-05-01 NOTE — Progress Notes (Signed)
 Teresa Parks                                          MRN: [id number]   05/01/2024   The VBCI Quality Team Specialist reviewed this patient medical record for the purposes of chart review for care gap closure. The following were reviewed: chart review for care gap closure-glycemic status assessment.    VBCI Quality Team

## 2024-05-16 DIAGNOSIS — R6889 Other general symptoms and signs: Secondary | ICD-10-CM | POA: Diagnosis not present

## 2024-08-11 ENCOUNTER — Other Ambulatory Visit (HOSPITAL_COMMUNITY): Payer: Self-pay

## 2024-08-11 ENCOUNTER — Telehealth: Payer: Self-pay | Admitting: Pharmacy Technician

## 2024-08-11 NOTE — Telephone Encounter (Signed)
 Pharmacy Patient Advocate Encounter  Received notification from [facility] that Prior Authorization for VASCEPA  has been APPROVED from 08/11/24 to 08/11/27   PA #/Case ID/Reference #: [id number]

## 2024-08-11 NOTE — Telephone Encounter (Signed)
 Insurance wants brand -still needs pa   Pharmacy Patient Advocate Encounter   Received notification from [employer] CMM KEY that prior authorization for VASCEPA  is required/requested.   Insurance verification completed.   The patient is insured through [facility].   Per test claim: PA required; PA submitted to above mentioned insurance via Latent Key/confirmation #/EOC [id number] Status is pending

## 2024-08-12 ENCOUNTER — Other Ambulatory Visit: Payer: Self-pay

## 2024-08-12 DIAGNOSIS — E781 Pure hyperglyceridemia: Secondary | ICD-10-CM

## 2024-08-18 NOTE — Telephone Encounter (Signed)
 Labs Outside of Normal  In accordance with refill protocols, please review and address the following requirements before this medication refill can be authorized:  Labs
